# Patient Record
Sex: Female | Born: 1955 | Race: White | Hispanic: No | Marital: Single | Smoking: Never smoker
Health system: Southern US, Community
[De-identification: ages and names within clinical notes are randomized; demographics above are authoritative.]

## PROBLEM LIST (undated history)

## (undated) DIAGNOSIS — C541 Malignant neoplasm of endometrium: Secondary | ICD-10-CM

## (undated) DIAGNOSIS — F32A Depression, unspecified: Secondary | ICD-10-CM

## (undated) DIAGNOSIS — M4800 Spinal stenosis, site unspecified: Secondary | ICD-10-CM

## (undated) DIAGNOSIS — M4306 Spondylolysis, lumbar region: Secondary | ICD-10-CM

## (undated) DIAGNOSIS — E079 Disorder of thyroid, unspecified: Secondary | ICD-10-CM

## (undated) DIAGNOSIS — F329 Major depressive disorder, single episode, unspecified: Secondary | ICD-10-CM

## (undated) DIAGNOSIS — T7840XA Allergy, unspecified, initial encounter: Secondary | ICD-10-CM

## (undated) DIAGNOSIS — I10 Essential (primary) hypertension: Secondary | ICD-10-CM

## (undated) DIAGNOSIS — M1990 Unspecified osteoarthritis, unspecified site: Secondary | ICD-10-CM

## (undated) DIAGNOSIS — I499 Cardiac arrhythmia, unspecified: Secondary | ICD-10-CM

## (undated) DIAGNOSIS — F419 Anxiety disorder, unspecified: Secondary | ICD-10-CM

## (undated) DIAGNOSIS — G4730 Sleep apnea, unspecified: Secondary | ICD-10-CM

## (undated) DIAGNOSIS — M797 Fibromyalgia: Secondary | ICD-10-CM

## (undated) HISTORY — DX: Spondylolysis, lumbar region: M43.06

## (undated) HISTORY — DX: Disorder of thyroid, unspecified: E07.9

## (undated) HISTORY — DX: Major depressive disorder, single episode, unspecified: F32.9

## (undated) HISTORY — DX: Depression, unspecified: F32.A

## (undated) HISTORY — PX: ABDOMINAL HYSTERECTOMY: SHX81

## (undated) HISTORY — DX: Allergy, unspecified, initial encounter: T78.40XA

## (undated) HISTORY — DX: Unspecified osteoarthritis, unspecified site: M19.90

## (undated) HISTORY — DX: Spinal stenosis, site unspecified: M48.00

## (undated) HISTORY — DX: Anxiety disorder, unspecified: F41.9

## (undated) HISTORY — DX: Fibromyalgia: M79.7

## (undated) HISTORY — DX: Sleep apnea, unspecified: G47.30

## (undated) HISTORY — DX: Essential (primary) hypertension: I10

---

## 1997-08-22 ENCOUNTER — Inpatient Hospital Stay (HOSPITAL_COMMUNITY): Admission: AD | Admit: 1997-08-22 | Discharge: 1997-08-23 | Payer: Self-pay | Admitting: Critical Care Medicine

## 1997-11-11 ENCOUNTER — Emergency Department (HOSPITAL_COMMUNITY): Admission: EM | Admit: 1997-11-11 | Discharge: 1997-11-11 | Payer: Self-pay | Admitting: Emergency Medicine

## 1998-01-22 ENCOUNTER — Ambulatory Visit (HOSPITAL_COMMUNITY): Admission: RE | Admit: 1998-01-22 | Discharge: 1998-01-22 | Payer: Self-pay | Admitting: Critical Care Medicine

## 1998-01-22 ENCOUNTER — Encounter: Payer: Self-pay | Admitting: Critical Care Medicine

## 1998-06-24 ENCOUNTER — Encounter: Admission: RE | Admit: 1998-06-24 | Discharge: 1998-06-24 | Payer: Self-pay | Admitting: Obstetrics & Gynecology

## 1998-06-24 ENCOUNTER — Other Ambulatory Visit: Admission: RE | Admit: 1998-06-24 | Discharge: 1998-06-24 | Payer: Self-pay | Admitting: Obstetrics & Gynecology

## 1998-06-29 ENCOUNTER — Inpatient Hospital Stay (HOSPITAL_COMMUNITY): Admission: AD | Admit: 1998-06-29 | Discharge: 1998-06-29 | Payer: Self-pay | Admitting: Obstetrics

## 1998-07-04 ENCOUNTER — Encounter: Payer: Self-pay | Admitting: Obstetrics & Gynecology

## 1998-07-04 ENCOUNTER — Ambulatory Visit (HOSPITAL_COMMUNITY): Admission: RE | Admit: 1998-07-04 | Discharge: 1998-07-04 | Payer: Self-pay | Admitting: Obstetrics & Gynecology

## 1998-08-18 ENCOUNTER — Emergency Department (HOSPITAL_COMMUNITY): Admission: EM | Admit: 1998-08-18 | Discharge: 1998-08-18 | Payer: Self-pay | Admitting: Emergency Medicine

## 1998-08-21 ENCOUNTER — Emergency Department (HOSPITAL_COMMUNITY): Admission: EM | Admit: 1998-08-21 | Discharge: 1998-08-21 | Payer: Self-pay | Admitting: Emergency Medicine

## 1998-08-31 ENCOUNTER — Emergency Department (HOSPITAL_COMMUNITY): Admission: EM | Admit: 1998-08-31 | Discharge: 1998-08-31 | Payer: Self-pay | Admitting: Emergency Medicine

## 1998-09-06 ENCOUNTER — Emergency Department (HOSPITAL_COMMUNITY): Admission: EM | Admit: 1998-09-06 | Discharge: 1998-09-06 | Payer: Self-pay | Admitting: Emergency Medicine

## 1998-09-28 ENCOUNTER — Encounter: Payer: Self-pay | Admitting: Emergency Medicine

## 1998-09-28 ENCOUNTER — Emergency Department (HOSPITAL_COMMUNITY): Admission: EM | Admit: 1998-09-28 | Discharge: 1998-09-28 | Payer: Self-pay | Admitting: Emergency Medicine

## 2000-02-20 ENCOUNTER — Emergency Department (HOSPITAL_COMMUNITY): Admission: EM | Admit: 2000-02-20 | Discharge: 2000-02-21 | Payer: Self-pay | Admitting: *Deleted

## 2000-06-11 ENCOUNTER — Emergency Department (HOSPITAL_COMMUNITY): Admission: EM | Admit: 2000-06-11 | Discharge: 2000-06-11 | Payer: Self-pay | Admitting: Emergency Medicine

## 2000-09-06 ENCOUNTER — Emergency Department (HOSPITAL_COMMUNITY): Admission: EM | Admit: 2000-09-06 | Discharge: 2000-09-07 | Payer: Self-pay | Admitting: Emergency Medicine

## 2000-09-07 ENCOUNTER — Encounter: Payer: Self-pay | Admitting: Emergency Medicine

## 2000-12-02 ENCOUNTER — Emergency Department (HOSPITAL_COMMUNITY): Admission: EM | Admit: 2000-12-02 | Discharge: 2000-12-02 | Payer: Self-pay | Admitting: Emergency Medicine

## 2001-07-18 ENCOUNTER — Emergency Department (HOSPITAL_COMMUNITY): Admission: EM | Admit: 2001-07-18 | Discharge: 2001-07-19 | Payer: Self-pay | Admitting: Emergency Medicine

## 2001-09-07 ENCOUNTER — Emergency Department (HOSPITAL_COMMUNITY): Admission: EM | Admit: 2001-09-07 | Discharge: 2001-09-07 | Payer: Self-pay | Admitting: Emergency Medicine

## 2001-11-22 ENCOUNTER — Emergency Department (HOSPITAL_COMMUNITY): Admission: EM | Admit: 2001-11-22 | Discharge: 2001-11-23 | Payer: Self-pay | Admitting: Emergency Medicine

## 2001-11-23 ENCOUNTER — Encounter: Payer: Self-pay | Admitting: Emergency Medicine

## 2002-01-02 ENCOUNTER — Ambulatory Visit (HOSPITAL_COMMUNITY): Admission: RE | Admit: 2002-01-02 | Discharge: 2002-01-02 | Payer: Self-pay | Admitting: Internal Medicine

## 2002-01-02 ENCOUNTER — Encounter: Payer: Self-pay | Admitting: Internal Medicine

## 2002-01-17 ENCOUNTER — Emergency Department (HOSPITAL_COMMUNITY): Admission: EM | Admit: 2002-01-17 | Discharge: 2002-01-18 | Payer: Self-pay | Admitting: Emergency Medicine

## 2002-01-19 ENCOUNTER — Emergency Department (HOSPITAL_COMMUNITY): Admission: EM | Admit: 2002-01-19 | Discharge: 2002-01-19 | Payer: Self-pay | Admitting: Emergency Medicine

## 2002-01-19 ENCOUNTER — Encounter: Payer: Self-pay | Admitting: Emergency Medicine

## 2002-03-17 ENCOUNTER — Emergency Department (HOSPITAL_COMMUNITY): Admission: EM | Admit: 2002-03-17 | Discharge: 2002-03-17 | Payer: Self-pay | Admitting: Emergency Medicine

## 2002-04-11 ENCOUNTER — Emergency Department (HOSPITAL_COMMUNITY): Admission: EM | Admit: 2002-04-11 | Discharge: 2002-04-11 | Payer: Self-pay | Admitting: Emergency Medicine

## 2002-04-11 ENCOUNTER — Encounter: Payer: Self-pay | Admitting: Emergency Medicine

## 2002-04-12 ENCOUNTER — Emergency Department (HOSPITAL_COMMUNITY): Admission: EM | Admit: 2002-04-12 | Discharge: 2002-04-12 | Payer: Self-pay | Admitting: Emergency Medicine

## 2002-05-04 ENCOUNTER — Encounter: Payer: Self-pay | Admitting: Emergency Medicine

## 2002-05-04 ENCOUNTER — Emergency Department (HOSPITAL_COMMUNITY): Admission: EM | Admit: 2002-05-04 | Discharge: 2002-05-04 | Payer: Self-pay | Admitting: Emergency Medicine

## 2002-06-19 ENCOUNTER — Encounter: Admission: RE | Admit: 2002-06-19 | Discharge: 2002-06-19 | Payer: Self-pay | Admitting: Endocrinology

## 2002-06-19 ENCOUNTER — Encounter: Payer: Self-pay | Admitting: Endocrinology

## 2002-06-30 ENCOUNTER — Emergency Department (HOSPITAL_COMMUNITY): Admission: EM | Admit: 2002-06-30 | Discharge: 2002-06-30 | Payer: Self-pay | Admitting: Emergency Medicine

## 2002-07-24 ENCOUNTER — Encounter: Payer: Self-pay | Admitting: Obstetrics & Gynecology

## 2002-07-24 ENCOUNTER — Ambulatory Visit (HOSPITAL_COMMUNITY): Admission: RE | Admit: 2002-07-24 | Discharge: 2002-07-24 | Payer: Self-pay | Admitting: Obstetrics & Gynecology

## 2002-07-28 ENCOUNTER — Emergency Department (HOSPITAL_COMMUNITY): Admission: EM | Admit: 2002-07-28 | Discharge: 2002-07-28 | Payer: Self-pay

## 2002-08-14 ENCOUNTER — Emergency Department (HOSPITAL_COMMUNITY): Admission: EM | Admit: 2002-08-14 | Discharge: 2002-08-14 | Payer: Self-pay | Admitting: Emergency Medicine

## 2002-09-02 ENCOUNTER — Encounter: Payer: Self-pay | Admitting: Emergency Medicine

## 2002-09-02 ENCOUNTER — Emergency Department (HOSPITAL_COMMUNITY): Admission: EM | Admit: 2002-09-02 | Discharge: 2002-09-02 | Payer: Self-pay | Admitting: Emergency Medicine

## 2002-09-03 ENCOUNTER — Emergency Department (HOSPITAL_COMMUNITY): Admission: EM | Admit: 2002-09-03 | Discharge: 2002-09-03 | Payer: Self-pay | Admitting: Emergency Medicine

## 2002-09-09 ENCOUNTER — Emergency Department (HOSPITAL_COMMUNITY): Admission: EM | Admit: 2002-09-09 | Discharge: 2002-09-10 | Payer: Self-pay | Admitting: Emergency Medicine

## 2002-11-08 ENCOUNTER — Emergency Department (HOSPITAL_COMMUNITY): Admission: EM | Admit: 2002-11-08 | Discharge: 2002-11-08 | Payer: Self-pay | Admitting: Emergency Medicine

## 2002-11-28 ENCOUNTER — Emergency Department (HOSPITAL_COMMUNITY): Admission: EM | Admit: 2002-11-28 | Discharge: 2002-11-28 | Payer: Self-pay | Admitting: Emergency Medicine

## 2003-03-22 ENCOUNTER — Emergency Department (HOSPITAL_COMMUNITY): Admission: EM | Admit: 2003-03-22 | Discharge: 2003-03-22 | Payer: Self-pay | Admitting: Emergency Medicine

## 2003-04-09 ENCOUNTER — Emergency Department (HOSPITAL_COMMUNITY): Admission: EM | Admit: 2003-04-09 | Discharge: 2003-04-09 | Payer: Self-pay

## 2003-04-16 ENCOUNTER — Emergency Department (HOSPITAL_COMMUNITY): Admission: EM | Admit: 2003-04-16 | Discharge: 2003-04-16 | Payer: Self-pay | Admitting: Emergency Medicine

## 2003-07-29 ENCOUNTER — Emergency Department (HOSPITAL_COMMUNITY): Admission: EM | Admit: 2003-07-29 | Discharge: 2003-07-29 | Payer: Self-pay | Admitting: Family Medicine

## 2003-08-29 ENCOUNTER — Ambulatory Visit (HOSPITAL_COMMUNITY): Admission: RE | Admit: 2003-08-29 | Discharge: 2003-08-29 | Payer: Self-pay | Admitting: Internal Medicine

## 2003-09-03 ENCOUNTER — Emergency Department (HOSPITAL_COMMUNITY): Admission: EM | Admit: 2003-09-03 | Discharge: 2003-09-03 | Payer: Self-pay | Admitting: *Deleted

## 2004-01-06 ENCOUNTER — Ambulatory Visit: Payer: Self-pay | Admitting: Endocrinology

## 2004-04-27 ENCOUNTER — Emergency Department (HOSPITAL_COMMUNITY): Admission: EM | Admit: 2004-04-27 | Discharge: 2004-04-27 | Payer: Self-pay | Admitting: Family Medicine

## 2004-05-11 ENCOUNTER — Ambulatory Visit (HOSPITAL_COMMUNITY): Admission: RE | Admit: 2004-05-11 | Discharge: 2004-05-11 | Payer: Self-pay | Admitting: Obstetrics & Gynecology

## 2004-05-28 ENCOUNTER — Emergency Department (HOSPITAL_COMMUNITY): Admission: EM | Admit: 2004-05-28 | Discharge: 2004-05-28 | Payer: Self-pay | Admitting: Family Medicine

## 2004-11-04 ENCOUNTER — Emergency Department (HOSPITAL_COMMUNITY): Admission: EM | Admit: 2004-11-04 | Discharge: 2004-11-04 | Payer: Self-pay | Admitting: Family Medicine

## 2004-12-08 ENCOUNTER — Ambulatory Visit: Payer: Self-pay | Admitting: Endocrinology

## 2005-03-18 ENCOUNTER — Emergency Department (HOSPITAL_COMMUNITY): Admission: EM | Admit: 2005-03-18 | Discharge: 2005-03-18 | Payer: Self-pay | Admitting: Emergency Medicine

## 2005-04-08 ENCOUNTER — Ambulatory Visit: Payer: Self-pay | Admitting: Endocrinology

## 2005-12-21 ENCOUNTER — Encounter: Admission: RE | Admit: 2005-12-21 | Discharge: 2005-12-21 | Payer: Self-pay | Admitting: Internal Medicine

## 2005-12-29 ENCOUNTER — Emergency Department (HOSPITAL_COMMUNITY): Admission: EM | Admit: 2005-12-29 | Discharge: 2005-12-29 | Payer: Self-pay | Admitting: Family Medicine

## 2006-01-28 ENCOUNTER — Ambulatory Visit: Payer: Self-pay | Admitting: Endocrinology

## 2006-06-22 ENCOUNTER — Emergency Department (HOSPITAL_COMMUNITY): Admission: EM | Admit: 2006-06-22 | Discharge: 2006-06-22 | Payer: Self-pay | Admitting: Emergency Medicine

## 2006-07-07 ENCOUNTER — Emergency Department (HOSPITAL_COMMUNITY): Admission: EM | Admit: 2006-07-07 | Discharge: 2006-07-08 | Payer: Self-pay | Admitting: Emergency Medicine

## 2006-07-11 ENCOUNTER — Emergency Department (HOSPITAL_COMMUNITY): Admission: EM | Admit: 2006-07-11 | Discharge: 2006-07-12 | Payer: Self-pay | Admitting: Emergency Medicine

## 2006-10-17 ENCOUNTER — Emergency Department (HOSPITAL_COMMUNITY): Admission: EM | Admit: 2006-10-17 | Discharge: 2006-10-17 | Payer: Self-pay | Admitting: Family Medicine

## 2006-11-29 DIAGNOSIS — F429 Obsessive-compulsive disorder, unspecified: Secondary | ICD-10-CM | POA: Insufficient documentation

## 2006-11-29 DIAGNOSIS — I10 Essential (primary) hypertension: Secondary | ICD-10-CM | POA: Insufficient documentation

## 2006-11-29 DIAGNOSIS — M4800 Spinal stenosis, site unspecified: Secondary | ICD-10-CM

## 2006-11-29 DIAGNOSIS — E039 Hypothyroidism, unspecified: Secondary | ICD-10-CM

## 2006-11-29 DIAGNOSIS — N83209 Unspecified ovarian cyst, unspecified side: Secondary | ICD-10-CM

## 2006-11-29 DIAGNOSIS — G5600 Carpal tunnel syndrome, unspecified upper limb: Secondary | ICD-10-CM

## 2006-11-29 DIAGNOSIS — J45909 Unspecified asthma, uncomplicated: Secondary | ICD-10-CM

## 2007-01-02 ENCOUNTER — Emergency Department (HOSPITAL_COMMUNITY): Admission: EM | Admit: 2007-01-02 | Discharge: 2007-01-02 | Payer: Self-pay | Admitting: Emergency Medicine

## 2007-02-11 ENCOUNTER — Emergency Department (HOSPITAL_COMMUNITY): Admission: EM | Admit: 2007-02-11 | Discharge: 2007-02-11 | Payer: Self-pay | Admitting: Family Medicine

## 2007-02-21 ENCOUNTER — Emergency Department (HOSPITAL_COMMUNITY): Admission: EM | Admit: 2007-02-21 | Discharge: 2007-02-21 | Payer: Self-pay | Admitting: Emergency Medicine

## 2007-03-25 ENCOUNTER — Emergency Department (HOSPITAL_COMMUNITY): Admission: EM | Admit: 2007-03-25 | Discharge: 2007-03-25 | Payer: Self-pay | Admitting: Family Medicine

## 2007-06-16 ENCOUNTER — Encounter: Admission: RE | Admit: 2007-06-16 | Discharge: 2007-06-16 | Payer: Self-pay | Admitting: Orthopaedic Surgery

## 2007-07-16 ENCOUNTER — Emergency Department (HOSPITAL_COMMUNITY): Admission: EM | Admit: 2007-07-16 | Discharge: 2007-07-16 | Payer: Self-pay | Admitting: Emergency Medicine

## 2007-09-27 ENCOUNTER — Emergency Department (HOSPITAL_COMMUNITY): Admission: EM | Admit: 2007-09-27 | Discharge: 2007-09-28 | Payer: Self-pay | Admitting: Emergency Medicine

## 2007-10-29 ENCOUNTER — Ambulatory Visit (HOSPITAL_BASED_OUTPATIENT_CLINIC_OR_DEPARTMENT_OTHER): Admission: RE | Admit: 2007-10-29 | Discharge: 2007-10-29 | Payer: Self-pay | Admitting: Internal Medicine

## 2007-11-04 ENCOUNTER — Ambulatory Visit: Payer: Self-pay | Admitting: Internal Medicine

## 2007-11-24 ENCOUNTER — Emergency Department (HOSPITAL_COMMUNITY): Admission: EM | Admit: 2007-11-24 | Discharge: 2007-11-25 | Payer: Self-pay | Admitting: Emergency Medicine

## 2007-12-05 ENCOUNTER — Emergency Department (HOSPITAL_COMMUNITY): Admission: EM | Admit: 2007-12-05 | Discharge: 2007-12-06 | Payer: Self-pay | Admitting: Emergency Medicine

## 2007-12-24 ENCOUNTER — Emergency Department (HOSPITAL_COMMUNITY): Admission: EM | Admit: 2007-12-24 | Discharge: 2007-12-24 | Payer: Self-pay | Admitting: Emergency Medicine

## 2008-01-12 ENCOUNTER — Emergency Department (HOSPITAL_COMMUNITY): Admission: EM | Admit: 2008-01-12 | Discharge: 2008-01-12 | Payer: Self-pay | Admitting: Emergency Medicine

## 2008-05-24 ENCOUNTER — Ambulatory Visit (HOSPITAL_COMMUNITY): Admission: RE | Admit: 2008-05-24 | Discharge: 2008-05-24 | Payer: Self-pay | Admitting: Internal Medicine

## 2008-08-05 ENCOUNTER — Ambulatory Visit (HOSPITAL_COMMUNITY): Admission: RE | Admit: 2008-08-05 | Discharge: 2008-08-05 | Payer: Self-pay | Admitting: Gastroenterology

## 2008-10-25 ENCOUNTER — Emergency Department (HOSPITAL_COMMUNITY): Admission: EM | Admit: 2008-10-25 | Discharge: 2008-10-25 | Payer: Self-pay | Admitting: Emergency Medicine

## 2008-11-14 ENCOUNTER — Emergency Department (HOSPITAL_COMMUNITY): Admission: EM | Admit: 2008-11-14 | Discharge: 2008-11-14 | Payer: Self-pay | Admitting: Emergency Medicine

## 2009-04-25 ENCOUNTER — Encounter
Admission: RE | Admit: 2009-04-25 | Discharge: 2009-04-28 | Payer: Self-pay | Admitting: Physical Medicine & Rehabilitation

## 2009-05-13 ENCOUNTER — Encounter: Admission: RE | Admit: 2009-05-13 | Discharge: 2009-05-13 | Payer: Self-pay | Admitting: Orthopedic Surgery

## 2009-05-15 ENCOUNTER — Emergency Department (HOSPITAL_COMMUNITY): Admission: EM | Admit: 2009-05-15 | Discharge: 2009-05-15 | Payer: Self-pay | Admitting: Emergency Medicine

## 2009-06-16 ENCOUNTER — Encounter: Admission: RE | Admit: 2009-06-16 | Discharge: 2009-06-16 | Payer: Self-pay | Admitting: Neurosurgery

## 2009-06-30 ENCOUNTER — Encounter: Admission: RE | Admit: 2009-06-30 | Discharge: 2009-06-30 | Payer: Self-pay | Admitting: Neurosurgery

## 2009-11-20 ENCOUNTER — Encounter
Admission: RE | Admit: 2009-11-20 | Discharge: 2010-02-18 | Payer: Self-pay | Admitting: Physical Medicine & Rehabilitation

## 2010-01-09 ENCOUNTER — Ambulatory Visit: Payer: Self-pay | Admitting: Physical Medicine & Rehabilitation

## 2010-01-28 ENCOUNTER — Encounter
Admission: RE | Admit: 2010-01-28 | Discharge: 2010-01-28 | Payer: Self-pay | Source: Home / Self Care | Attending: Obstetrics & Gynecology | Admitting: Obstetrics & Gynecology

## 2010-02-13 ENCOUNTER — Encounter
Admission: RE | Admit: 2010-02-13 | Discharge: 2010-04-28 | Payer: Self-pay | Source: Home / Self Care | Attending: Physical Medicine & Rehabilitation | Admitting: Physical Medicine & Rehabilitation

## 2010-04-18 ENCOUNTER — Encounter: Payer: Self-pay | Admitting: Obstetrics & Gynecology

## 2010-04-19 ENCOUNTER — Encounter: Payer: Self-pay | Admitting: Family Medicine

## 2010-04-19 ENCOUNTER — Encounter: Payer: Self-pay | Admitting: Obstetrics & Gynecology

## 2010-04-19 ENCOUNTER — Encounter: Payer: Self-pay | Admitting: Orthopaedic Surgery

## 2010-04-19 ENCOUNTER — Encounter: Payer: Self-pay | Admitting: Internal Medicine

## 2010-04-19 ENCOUNTER — Encounter: Payer: Self-pay | Admitting: Endocrinology

## 2010-04-19 ENCOUNTER — Encounter: Payer: Self-pay | Admitting: Orthopedic Surgery

## 2010-04-23 ENCOUNTER — Emergency Department (HOSPITAL_COMMUNITY)
Admission: EM | Admit: 2010-04-23 | Discharge: 2010-04-23 | Payer: Self-pay | Source: Home / Self Care | Admitting: Family Medicine

## 2010-06-08 ENCOUNTER — Encounter: Payer: Medicaid Other | Attending: Physical Medicine & Rehabilitation

## 2010-06-08 ENCOUNTER — Encounter (HOSPITAL_BASED_OUTPATIENT_CLINIC_OR_DEPARTMENT_OTHER): Payer: Medicaid Other | Admitting: Physical Medicine & Rehabilitation

## 2010-06-08 DIAGNOSIS — M545 Low back pain, unspecified: Secondary | ICD-10-CM | POA: Insufficient documentation

## 2010-06-08 DIAGNOSIS — M47817 Spondylosis without myelopathy or radiculopathy, lumbosacral region: Secondary | ICD-10-CM

## 2010-06-08 DIAGNOSIS — M549 Dorsalgia, unspecified: Secondary | ICD-10-CM | POA: Insufficient documentation

## 2010-06-08 DIAGNOSIS — M48061 Spinal stenosis, lumbar region without neurogenic claudication: Secondary | ICD-10-CM | POA: Insufficient documentation

## 2010-06-15 ENCOUNTER — Ambulatory Visit: Payer: Medicaid Other | Admitting: Neurosurgery

## 2010-06-17 LAB — CBC
MCHC: 34.9 g/dL (ref 30.0–36.0)
Platelets: 245 10*3/uL (ref 150–400)
RDW: 12.9 % (ref 11.5–15.5)

## 2010-06-17 LAB — CK TOTAL AND CKMB (NOT AT ARMC)
CK, MB: 1.1 ng/mL (ref 0.3–4.0)
Relative Index: INVALID (ref 0.0–2.5)
Total CK: 69 U/L (ref 7–177)

## 2010-06-17 LAB — COMPREHENSIVE METABOLIC PANEL
Alkaline Phosphatase: 67 U/L (ref 39–117)
BUN: 7 mg/dL (ref 6–23)
Calcium: 9.5 mg/dL (ref 8.4–10.5)
Creatinine, Ser: 0.64 mg/dL (ref 0.4–1.2)
Glucose, Bld: 104 mg/dL — ABNORMAL HIGH (ref 70–99)
Total Protein: 7.8 g/dL (ref 6.0–8.3)

## 2010-06-17 LAB — DIFFERENTIAL
Basophils Relative: 0 % (ref 0–1)
Lymphocytes Relative: 43 % (ref 12–46)
Lymphs Abs: 3.3 10*3/uL (ref 0.7–4.0)
Monocytes Relative: 9 % (ref 3–12)
Neutro Abs: 3.4 10*3/uL (ref 1.7–7.7)
Neutrophils Relative %: 44 % (ref 43–77)

## 2010-06-17 LAB — URINALYSIS, ROUTINE W REFLEX MICROSCOPIC
Hgb urine dipstick: NEGATIVE
Nitrite: NEGATIVE
Specific Gravity, Urine: 1.014 (ref 1.005–1.030)
Urobilinogen, UA: 0.2 mg/dL (ref 0.0–1.0)
pH: 5.5 (ref 5.0–8.0)

## 2010-06-17 LAB — LIPASE, BLOOD: Lipase: 38 U/L (ref 11–59)

## 2010-06-17 LAB — URINE MICROSCOPIC-ADD ON

## 2010-06-17 LAB — URINE CULTURE: Colony Count: 25000

## 2010-06-29 ENCOUNTER — Ambulatory Visit: Payer: Medicaid Other | Admitting: Physical Medicine & Rehabilitation

## 2010-07-04 LAB — DIFFERENTIAL
Eosinophils Absolute: 0.3 10*3/uL (ref 0.0–0.7)
Lymphocytes Relative: 40 % (ref 12–46)
Lymphs Abs: 3.8 10*3/uL (ref 0.7–4.0)
Monocytes Relative: 7 % (ref 3–12)
Neutrophils Relative %: 50 % (ref 43–77)

## 2010-07-04 LAB — COMPREHENSIVE METABOLIC PANEL
ALT: 33 U/L (ref 0–35)
AST: 75 U/L — ABNORMAL HIGH (ref 0–37)
Alkaline Phosphatase: 70 U/L (ref 39–117)
CO2: 25 mEq/L (ref 19–32)
Calcium: 9.3 mg/dL (ref 8.4–10.5)
Chloride: 104 mEq/L (ref 96–112)
GFR calc Af Amer: >60 mL/min (ref >60)
GFR calc non Af Amer: >60 mL/min (ref >60)
Glucose, Bld: 99 mg/dL (ref 70–99)
Sodium: 137 mEq/L (ref 135–145)
Total Bilirubin: 0.6 mg/dL (ref 0.3–1.2)

## 2010-07-04 LAB — URINALYSIS, ROUTINE W REFLEX MICROSCOPIC
Bilirubin Urine: NEGATIVE
Hgb urine dipstick: NEGATIVE
Ketones, ur: NEGATIVE mg/dL
Nitrite: NEGATIVE
Protein, ur: NEGATIVE mg/dL
Urobilinogen, UA: 0.2 mg/dL (ref 0.0–1.0)

## 2010-07-04 LAB — CBC
HCT: 43.7 % (ref 36.0–46.0)
MCV: 88.1 fL (ref 78.0–100.0)
Platelets: 250 10*3/uL (ref 150–400)
RBC: 4.96 MIL/uL (ref 3.87–5.11)
WBC: 9.6 10*3/uL (ref 4.0–10.5)

## 2010-07-04 LAB — PREGNANCY, URINE: Preg Test, Ur: NEGATIVE

## 2010-07-04 LAB — URINE MICROSCOPIC-ADD ON

## 2010-07-13 ENCOUNTER — Encounter: Payer: Medicaid Other | Admitting: Physical Medicine & Rehabilitation

## 2010-07-31 ENCOUNTER — Ambulatory Visit: Payer: Medicaid Other | Admitting: Physical Medicine & Rehabilitation

## 2010-08-11 NOTE — Procedures (Signed)
[name], [name]            ACCOUNT NO.:  [id number]   MEDICAL RECORD NO.:  [id number]          PATIENT TYPE:  OUT   LOCATION:  SLEEP CENTER                 FACILITY:  [facility]   PHYSICIAN:  Clinton D. Maple Hudson, MD, FCCP, FACPDATE OF BIRTH:  10-21-1955   DATE OF STUDY:  10/29/2007                            NOCTURNAL POLYSOMNOGRAM   REFERRING PHYSICIAN:  Fleet Contras, M.D.   REFERRING PHYSICIAN:  Dr. Fleet Contras.   INDICATION FOR STUDY:  Hypersomnia with sleep apnea.   EPWORTH SLEEPINESS SCORE:  7/24.  BMI 38, weight 187 pounds, height 59  inches, neck 17 inches.   HOME MEDICATIONS:  Home medications charted and reviewed.   A diagnostic study on May 19, 2006 had recorded an AHI of 12.5 per  hour.  CPAP titration is requested.   SLEEP ARCHITECTURE:  Total sleep time 243 minutes with sleep efficiency  64.5%.  Stage 1 was 3.7%, stage 2 87.7%, stage 3 absent, REM 8.6% of  total sleep time, sleep latency 96 minutes, REM latency 240 minutes,  awake after sleep onset 24 minutes, arousal index 13.6.  Lorazepam was  taken at bedtime.   CPAP titration protocol:  CPAP was titrated to 10 CWP, AHI 0 per hour.  She chose a small Mirage Quattro mask with heated humidifier.   OXYGEN DATA:  Snoring was prevented by CPAP, and mean oxygen saturation  held at 95.6% while on CPAP with room air.   CARDIAC DATA:  Normal sinus rhythm.   MOVEMENT/PARASOMNIA:  A total of 16 limb jerks were counted of which 4  were associated with arousal or awakening for an index of 1 per hour  which is of doubtful significance.   IMPRESSION/RECOMMENDATION:  1. Successful CPAP titration to 10 CWP, AHI 0 per hour.  She chose a      small Mirage Quattro full face mask with      heated humidifier.  2. Baseline diagnostic NPSG on May 19, 2006 had recorded an AHI      of 12.5 per hour.      Clinton D. Maple Hudson, MD, [facility], FACP  Diplomate, Biomedical engineer of Sleep Medicine  Electronically Signed     CDY/MEDQ  D:  11/04/2007 09:48:12  T:  11/04/2007 10:13:59  Job:  [id number]   cc:   Fleet Contras, M.D.  Fax: [phone]

## 2010-08-14 NOTE — Letter (Signed)
December 30, 2005    [facility] [place] [place]  [address]  [place], [place] [address]   RE:  [name], [name]  MRN:  [id number]  /  DOB:  July 12, 1955   Dear [name]:   The staff here at [facility] advises me that you have once again  cancelled you appointment at the last minute.  I have reviewed your medical  record today and found that this is a chronic, ongoing pattern.   Because of this pattern, you are being discharged as a patient from [facility].  This dismissal becomes effective 30 days after your receipt of  this letter.   You are advised to seek other medical care as soon as possible for your  health care needs.  Our medical records department is happy to forward  records to your new physician upon your written request.  There may be a  charge for this service.   With best wishes for your health.    Sincerely,     ______________________________  Cleophas Dunker. Everardo All, MD    SAE/MedQ  /  Job #:  [id number]  DD:  12/30/2005 / DT:  12/31/2005

## 2010-10-12 ENCOUNTER — Encounter (INDEPENDENT_AMBULATORY_CARE_PROVIDER_SITE_OTHER): Payer: Self-pay | Admitting: Surgery

## 2010-12-24 LAB — POCT CARDIAC MARKERS: Operator id: 264421

## 2010-12-24 LAB — DIFFERENTIAL
Basophils Relative: 1
Eosinophils Absolute: 0.3
Lymphs Abs: 4.1 — ABNORMAL HIGH
Neutro Abs: 5.8
Neutrophils Relative %: 53

## 2010-12-24 LAB — CBC
HCT: 45.3
Hemoglobin: 15.8 — ABNORMAL HIGH
MCHC: 34.9
RBC: 5.22 — ABNORMAL HIGH
RDW: 13.1

## 2010-12-24 LAB — COMPREHENSIVE METABOLIC PANEL
ALT: 28
Alkaline Phosphatase: 79
BUN: 10
CO2: 24
Calcium: 9.8
GFR calc non Af Amer: >60
Glucose, Bld: 119 — ABNORMAL HIGH
Sodium: 140

## 2010-12-24 LAB — LIPASE, BLOOD: Lipase: 35

## 2010-12-24 LAB — D-DIMER, QUANTITATIVE: D-Dimer, Quant: 0.29

## 2010-12-28 LAB — POCT URINALYSIS DIP (DEVICE)
Bilirubin Urine: NEGATIVE
Glucose, UA: NEGATIVE
Hgb urine dipstick: NEGATIVE
Ketones, ur: NEGATIVE
Specific Gravity, Urine: 1.01

## 2010-12-28 LAB — URINALYSIS, ROUTINE W REFLEX MICROSCOPIC
Bilirubin Urine: NEGATIVE
Hgb urine dipstick: NEGATIVE
Ketones, ur: NEGATIVE
Specific Gravity, Urine: 1.014
pH: 5.5

## 2010-12-28 LAB — WET PREP, GENITAL
Clue Cells Wet Prep HPF POC: NONE SEEN
Trich, Wet Prep: NONE SEEN

## 2010-12-28 LAB — GC/CHLAMYDIA PROBE AMP, GENITAL
Chlamydia, DNA Probe: NEGATIVE
GC Probe Amp, Genital: NEGATIVE

## 2010-12-30 ENCOUNTER — Emergency Department (HOSPITAL_COMMUNITY)
Admission: EM | Admit: 2010-12-30 | Discharge: 2010-12-30 | Disposition: A | Discharge status: Home / Self Care | Payer: Medicaid Other | Attending: Emergency Medicine | Admitting: Emergency Medicine

## 2010-12-30 DIAGNOSIS — Z79899 Other long term (current) drug therapy: Secondary | ICD-10-CM | POA: Insufficient documentation

## 2010-12-30 DIAGNOSIS — R059 Cough, unspecified: Secondary | ICD-10-CM | POA: Insufficient documentation

## 2010-12-30 DIAGNOSIS — I10 Essential (primary) hypertension: Secondary | ICD-10-CM | POA: Insufficient documentation

## 2010-12-30 DIAGNOSIS — M549 Dorsalgia, unspecified: Secondary | ICD-10-CM | POA: Insufficient documentation

## 2010-12-30 DIAGNOSIS — F3289 Other specified depressive episodes: Secondary | ICD-10-CM | POA: Insufficient documentation

## 2010-12-30 DIAGNOSIS — F411 Generalized anxiety disorder: Secondary | ICD-10-CM | POA: Insufficient documentation

## 2010-12-30 DIAGNOSIS — R05 Cough: Secondary | ICD-10-CM | POA: Insufficient documentation

## 2010-12-30 DIAGNOSIS — F329 Major depressive disorder, single episode, unspecified: Secondary | ICD-10-CM | POA: Insufficient documentation

## 2010-12-30 DIAGNOSIS — R300 Dysuria: Secondary | ICD-10-CM | POA: Insufficient documentation

## 2011-01-05 LAB — CULTURE, ROUTINE-ABSCESS

## 2011-07-08 ENCOUNTER — Other Ambulatory Visit: Payer: Self-pay

## 2011-07-08 ENCOUNTER — Emergency Department (HOSPITAL_COMMUNITY)
Admission: EM | Admit: 2011-07-08 | Discharge: 2011-07-08 | Disposition: A | Discharge status: Home / Self Care | Payer: Medicaid Other | Attending: Emergency Medicine | Admitting: Emergency Medicine

## 2011-07-08 ENCOUNTER — Emergency Department (HOSPITAL_COMMUNITY): Payer: Medicaid Other

## 2011-07-08 ENCOUNTER — Encounter (HOSPITAL_COMMUNITY): Payer: Self-pay | Admitting: *Deleted

## 2011-07-08 DIAGNOSIS — J45909 Unspecified asthma, uncomplicated: Secondary | ICD-10-CM | POA: Insufficient documentation

## 2011-07-08 DIAGNOSIS — I10 Essential (primary) hypertension: Secondary | ICD-10-CM | POA: Insufficient documentation

## 2011-07-08 DIAGNOSIS — E079 Disorder of thyroid, unspecified: Secondary | ICD-10-CM | POA: Insufficient documentation

## 2011-07-08 DIAGNOSIS — F341 Dysthymic disorder: Secondary | ICD-10-CM | POA: Insufficient documentation

## 2011-07-08 DIAGNOSIS — R002 Palpitations: Secondary | ICD-10-CM

## 2011-07-08 DIAGNOSIS — M129 Arthropathy, unspecified: Secondary | ICD-10-CM | POA: Insufficient documentation

## 2011-07-08 DIAGNOSIS — R5383 Other fatigue: Secondary | ICD-10-CM | POA: Insufficient documentation

## 2011-07-08 DIAGNOSIS — R5381 Other malaise: Secondary | ICD-10-CM | POA: Insufficient documentation

## 2011-07-08 DIAGNOSIS — Z79899 Other long term (current) drug therapy: Secondary | ICD-10-CM | POA: Insufficient documentation

## 2011-07-08 LAB — DIFFERENTIAL
Basophils Relative: 0 % (ref 0–1)
Eosinophils Absolute: 0.6 10*3/uL (ref 0.0–0.7)
Neutrophils Relative %: 45 % (ref 43–77)

## 2011-07-08 LAB — COMPREHENSIVE METABOLIC PANEL
Albumin: 4.2 g/dL (ref 3.5–5.2)
Alkaline Phosphatase: 58 U/L (ref 39–117)
BUN: 10 mg/dL (ref 6–23)
Calcium: 9.5 mg/dL (ref 8.4–10.5)
Potassium: 4 mEq/L (ref 3.5–5.1)
Sodium: 136 mEq/L (ref 135–145)
Total Protein: 7.6 g/dL (ref 6.0–8.3)

## 2011-07-08 LAB — URINALYSIS, ROUTINE W REFLEX MICROSCOPIC
Bilirubin Urine: NEGATIVE
Nitrite: NEGATIVE
Specific Gravity, Urine: 1.017 (ref 1.005–1.030)
pH: 6 (ref 5.0–8.0)

## 2011-07-08 LAB — URINE MICROSCOPIC-ADD ON

## 2011-07-08 LAB — TROPONIN I: Troponin I: <0.3 ng/mL (ref <0.30)

## 2011-07-08 LAB — CBC
MCH: 30.2 pg (ref 26.0–34.0)
MCHC: 35 g/dL (ref 30.0–36.0)
Platelets: 213 10*3/uL (ref 150–400)

## 2011-07-08 MED ORDER — SODIUM CHLORIDE 0.9 % IV BOLUS (SEPSIS)
1000.0000 mL | Freq: Once | INTRAVENOUS | Status: AC
  Administered 2011-07-08: 1000 mL via INTRAVENOUS

## 2011-07-08 NOTE — ED Notes (Signed)
Pt states she has been feeling weak for the last couple of months. Pt states has been has menstrual bleeding for the past couple of months. Pt states she has just been feeling weak and fatgue

## 2011-07-08 NOTE — ED Notes (Signed)
MD at bedside. Dr. Lockwood at bedside.  

## 2011-07-08 NOTE — ED Provider Notes (Signed)
History     CSN: [id number]  Arrival date & time 07/08/11  1743   First MD Initiated Contact with Patient 07/08/11 1846      Chief Complaint  Patient presents with  . Palpitations  . Weakness    (Consider location/radiation/quality/duration/timing/severity/associated sxs/prior treatment) HPI the patient presents with concerns over palpitations.  She notes that she has had intermittent episodes of palpitations for the past several years, but over the past 3 days palpitations and been more frequent, more prominent.  She denies any specific precipitants, or any exacerbating factors.  Spontaneous onset is typical, as is spontaneous cessation.  She denies any concurrent dyspnea, lightheadedness, nausea, vomiting, diarrhea. The patient underwent endometrial biopsy today for postmenopausal bleeding.  She is scheduled for ultrasound in one week.  Past Medical History  Diagnosis Date  . Sleep apnea   . Hypertension   . Thyroid disease   . Arthritis   . Asthma   . Depression   . Anxiety   . Fibromyalgia   . Lumbar spondylolysis   . Allergy     Past Surgical History  Procedure Date  . Cesarean section     No family history on file.  History  Substance Use Topics  . Smoking status: Never Smoker   . Smokeless tobacco: Never Used  . Alcohol Use: No    OB History    Grav Para Term Preterm Abortions TAB SAB Ect Mult Living                  Review of Systems  Constitutional:       HPI  HENT:       HPI otherwise negative  Eyes: Negative.   Respiratory:       HPI, otherwise negative  Cardiovascular:       HPI, otherwise nmegative  Gastrointestinal: Negative for vomiting.  Genitourinary:       HPI, otherwise negative  Musculoskeletal:       HPI, otherwise negative  Skin: Negative.   Neurological: Negative for syncope.    Allergies  Sulfamethoxazole w/trimethoprim  Home Medications   Current Outpatient Rx  Name Route Sig Dispense Refill  . LEVOTHYROXINE  SODIUM 125 MCG PO TABS Oral Take 125 mcg by mouth daily.      Marland Kitchen LORAZEPAM 1 MG PO TABS Oral Take 1 mg by mouth 2 (two) times daily.      . SERTRALINE HCL 100 MG PO TABS Oral Take 200 mg by mouth daily.      . THIOTHIXENE 2 MG PO CAPS Oral Take 2 mg by mouth 1 day or 1 dose.        BP 129/103  Pulse 95  Temp(Src) 98.5 F (36.9 C) (Oral)  Resp 24  SpO2 95%  Physical Exam  Nursing note and vitals reviewed. Constitutional: She is oriented to person, place, and time. She appears well-developed and well-nourished. No distress.  HENT:  Head: Normocephalic and atraumatic.  Eyes: Conjunctivae and EOM are normal.  Cardiovascular: Normal rate and regular rhythm.   Pulmonary/Chest: Effort normal and breath sounds normal. No stridor. No respiratory distress.  Abdominal: She exhibits no distension.  Musculoskeletal: She exhibits no edema.  Neurological: She is alert and oriented to person, place, and time. No cranial nerve deficit.  Skin: Skin is warm and dry.  Psychiatric: Her speech is normal and behavior is normal. Judgment and thought content normal. Her mood appears anxious. Cognition and memory are normal.    ED Course  Procedures (including  critical care time)   Labs Reviewed  CBC  DIFFERENTIAL  COMPREHENSIVE METABOLIC PANEL  TROPONIN I  URINALYSIS, ROUTINE W REFLEX MICROSCOPIC  TSH  T4, FREE   No results found.   No diagnosis found.  Pulse oximetry 99% room air normal Cardiac monitor 91 SR normal  CXR reviewed by me   Date: 07/08/2011  Rate: 99  Rhythm: normal sinus rhythm  QRS Axis: normal  Intervals: normal  ST/T Wave abnormalities: normal  Conduction Disutrbances:left anterior fascicular block  Narrative Interpretation:   Old EKG Reviewed: none available ABNORMAL   MDM  This generally well-appearing female presents with concerns over ongoing palpitations.  Notably, the patient has a history of palpitations for years, but it is only over the past several  days the palpitations have become more present.  She denies any pain, dyspnea, fever, chills, near syncope, or other concerning prescriptions of her palpitations.  On exam the patient is in no distress with unremarkable physical exam findings.  The patient's labs are reassuring.  The patient's mildly abnormal urinalysis and is likely due to today's endometrial biopsy.  Patient will require additional evaluation of her post menstrual bleeding as well as her palpitations.  I discussed return precautions, and the need to call her physician tomorrow to continue evaluation of the palpitations, specifically to discuss endocrine evaluation, TSH was sent, and consider continuous cardiac monitoring.      Gerhard Munch, MD 07/08/11 2300

## 2011-07-08 NOTE — ED Notes (Addendum)
Pt states she has been weak and tired for years. States she has had biopsy today for endometriosis. Pt also states she has been having menopause for 6 years. Pt very talkative.

## 2011-07-08 NOTE — Discharge Instructions (Signed)
It is very important to speak with your physician tomorrow to ensure appropriate ongoing evaluation of your palpitations.  As we discussed, there are multiple are multiple causes of palpitations.  If you develop any new, or concerning changes in your condition, prior to the physician, please return to emergency department.Palpitations  A palpitation is the feeling that your heartbeat is irregular or is faster than normal. Although this is frightening, it usually is not serious. Palpitations may be caused by excesses of smoking, caffeine, or alcohol. They are also brought on by stress and anxiety. Sometimes, they are caused by heart disease. Unless otherwise noted, your caregiver did not find any signs of serious illness at this time. HOME CARE INSTRUCTIONS  To help prevent palpitations:  Drink decaffeinated coffee, tea, and soda pop. Avoid chocolate.   If you smoke or drink alcohol, quit or cut down as much as possible.   Reduce your stress or anxiety level. Biofeedback, yoga, or meditation will help you relax. Physical activity such as swimming, jogging, or walking also may be helpful.  SEEK MEDICAL CARE IF:   You continue to have a fast heartbeat.   Your palpitations occur more often.  SEEK IMMEDIATE MEDICAL CARE IF: You develop chest pain, shortness of breath, severe headache, dizziness, or fainting. Document Released: 03/12/2000 Document Revised: 03/04/2011 Document Reviewed: 05/12/2007 [facility] Patient Information 2012 [place], [place].

## 2011-07-08 NOTE — ED Notes (Signed)
Pt made aware of need for urine. Pt attempted to urinate in bathroom. Unable to pee in cup d/t "hands are too short to get it under there" nurse notified.

## 2011-07-09 LAB — T4, FREE: Free T4: 1.24 ng/dL (ref 0.80–1.80)

## 2011-07-09 LAB — TSH: TSH: 0.794 u[IU]/mL (ref 0.350–4.500)

## 2011-07-21 ENCOUNTER — Encounter: Payer: Self-pay | Admitting: Gynecologic Oncology

## 2011-07-21 ENCOUNTER — Ambulatory Visit: Payer: Medicaid Other | Attending: Gynecologic Oncology | Admitting: Gynecologic Oncology

## 2011-07-21 VITALS — BP 120/80 | HR 80 | Temp 97.9°F | Resp 14 | Ht 58.86 in | Wt 200.3 lb

## 2011-07-21 DIAGNOSIS — C541 Malignant neoplasm of endometrium: Secondary | ICD-10-CM | POA: Insufficient documentation

## 2011-07-21 DIAGNOSIS — C549 Malignant neoplasm of corpus uteri, unspecified: Secondary | ICD-10-CM | POA: Insufficient documentation

## 2011-07-21 DIAGNOSIS — I10 Essential (primary) hypertension: Secondary | ICD-10-CM | POA: Insufficient documentation

## 2011-07-21 DIAGNOSIS — G4730 Sleep apnea, unspecified: Secondary | ICD-10-CM | POA: Insufficient documentation

## 2011-07-21 NOTE — Progress Notes (Signed)
Consult Note: Gyn-Onc  [name] 56 y.o. female  CC:  Chief Complaint  Patient presents with  . Cancer    New pt    HPI: Patient is seen today in consultation at the request of Dr. Tamela Oddi. Should is a 56 year old gravida 1 para 1 has been bleeding for greater than one year's time. She was seen by Dr. Tamela Oddi and had an endometrial biopsy performed revealed a grade 1 endometrial carcinoma. It is for this reason she is referred to [name] today. It is very difficult to get a history from the patient as she is very tangential. It sounds like she went through menopause at the age of 54 she took hormone replacement therapy for about a month's time and isn't bleeding for greater than 9 months. She states that she was scared and never went to see a physician for this. She also is complaining of abdominal pain. She states that 3 years ago she was supposed to have a cholecystectomy.  Early she was scared and never went out for this. She states she does have known as fatty liver. Interval History:   Review of Systems: She vaginal bleeding. She is complaining of cardiac palpitations for about 2 weeks. She states she was seen by Dr. Rennis Golden approximately 2-3 weeks is not sure she mentioned that her palpitations 10. She is status post a fall. She was seen emergency room and mentioned this to them. Per report there were that'll put a Holter monitor on her but then that was not accomplished. She occasionally has some chest pains but she's unsure of the chest pain is related to the abdominal pain that she has or not. She states she does have sleep apnea she stopped using her CPAP machine. She is very tired during the day she so tired she states "she can't take a bath".  She complains of back pain a right-sided abdominal pain. She states that she is spinal stenosis both of her lumbar spine as well as her C-spine osteoarthritis and bulging discs. Change in her bowel or bladder habits any unintentional  weight loss weight gain.  Current Meds:  Outpatient Encounter Prescriptions as of 07/21/2011  Medication Sig Dispense Refill  . ibuprofen (ADVIL,MOTRIN) 200 MG tablet Take 400 mg by mouth at bedtime as needed. Back pain.      Marland Kitchen levothyroxine (SYNTHROID, LEVOTHROID) 125 MCG tablet Take 125 mcg by mouth daily.        Marland Kitchen LORazepam (ATIVAN) 1 MG tablet Take 1 mg by mouth 2 (two) times daily.        . sertraline (ZOLOFT) 100 MG tablet Take 200 mg by mouth daily.       Marland Kitchen thiothixene (NAVANE) 2 MG capsule Take 2 mg by mouth at bedtime.       . Vitamin D, Ergocalciferol, (DRISDOL) 50000 UNITS CAPS Take 50,000 Units by mouth every 7 (seven) days. On Monday.        Allergy:  Allergies  Allergen Reactions  . Sulfamethoxazole W/Trimethoprim     Social Hx:   History   Social History  . Marital Status: Single    Spouse Name: N/A    Number of Children: N/A  . Years of Education: N/A   Occupational History  . Not on file.   Social History Main Topics  . Smoking status: Former Smoker    Quit date: 03/30/1963  . Smokeless tobacco: Never Used  . Alcohol Use: No  . Drug Use: Not on file  .  Sexually Active: Not on file   Other Topics Concern  . Not on file   Social History Narrative  . No narrative on file    Past Surgical Hx:  Past Surgical History  Procedure Date  . Cesarean section     Past Medical Hx:  Past Medical History  Diagnosis Date  . Sleep apnea   . Hypertension   . Thyroid disease   . Arthritis   . Asthma   . Depression   . Anxiety   . Fibromyalgia   . Lumbar spondylolysis   . Allergy     Family Hx: No family history on file.  Vitals:  Blood pressure 120/80, pulse 80, temperature 97.9 F (36.6 C), temperature source Oral, resp. rate 14, height 4' 10.86" (1.495 m), weight 200 lb 4.8 oz (90.855 kg).  Physical Exam: First well-developed female in no acute distress.  Neck: No lymphadenopathy no thyromegaly.  Lungs: Auscultation  bilaterally.  Cardiovascular regular rate rhythm without murmur. No tachycardia no palpitations noted.  Abdomen: Mildly obese. Soft nontender nondistended. There no palpable mass of the exam is limited by habitus.  Groins: Candida infection in the left intertriginous.  Extremities: 1+ nonpitting edema.  Pelvic: Normal external female genitalia. The vagina is atrophic the cervix is visualized is no visible lesions. Bimanual examination is limited by habitus is no nodularity.  Assessment/Plan:  Extremities gravida 1 para 1 with history of grade 1 endometrial carcinoma clinical stage one. I discussed with her proceeding with robotic-assisted hysterectomy BSO. The uterus be sent for frozen section and lipase lymphadenectomy based on this. She understands he may need to be a abdominal incision secondary to visualization due to her obesity. We also discussed that she should be using her CPAP machine as untreated sleep apnea carries with it significant medical complications. We also discussed her heart palpitations. She's not sure if she discussed this with Dr. Rennis Golden when she recently saw him. We have contacted with Dr. Blanchie Dessert office as well as Dr. Rueben Bash office for assistance with preoperative evaluation. Risks and benefits of surgery were discussed with the patient and she very much wishes to proceed as soon as possible but understands the need to evaluate her other medical issues. Shelbia Scinto A., MD 07/21/2011, 4:01 PM

## 2011-07-21 NOTE — Patient Instructions (Signed)
F/u as scheduled

## 2011-07-22 ENCOUNTER — Telehealth: Payer: Self-pay | Admitting: Gynecologic Oncology

## 2011-07-22 NOTE — Telephone Encounter (Signed)
Spoke with patient about upcoming appointments for pre-surgical clearance.   Pt to see Dr. Concepcion Elk on July 26, 2011 at 2:45pm and Dr. Italy Hilty (cardiologist) on Jul 29, 2011 at 2:30pm.  Pt understanding the importance of going to the above appointments in order to keep her surgery on schedule.  Pt stating that she wrote the dates and times down.

## 2011-07-26 ENCOUNTER — Emergency Department (HOSPITAL_COMMUNITY)
Admission: EM | Admit: 2011-07-26 | Discharge: 2011-07-27 | Disposition: A | Discharge status: Home / Self Care | Payer: Medicaid Other | Attending: Emergency Medicine | Admitting: Emergency Medicine

## 2011-07-26 ENCOUNTER — Emergency Department (HOSPITAL_COMMUNITY): Payer: Medicaid Other

## 2011-07-26 ENCOUNTER — Other Ambulatory Visit: Payer: Self-pay

## 2011-07-26 ENCOUNTER — Other Ambulatory Visit (HOSPITAL_COMMUNITY): Payer: Self-pay | Admitting: Pharmacy Technician

## 2011-07-26 ENCOUNTER — Encounter (HOSPITAL_COMMUNITY): Payer: Self-pay | Admitting: Emergency Medicine

## 2011-07-26 DIAGNOSIS — E079 Disorder of thyroid, unspecified: Secondary | ICD-10-CM | POA: Insufficient documentation

## 2011-07-26 DIAGNOSIS — C549 Malignant neoplasm of corpus uteri, unspecified: Secondary | ICD-10-CM | POA: Insufficient documentation

## 2011-07-26 DIAGNOSIS — F3289 Other specified depressive episodes: Secondary | ICD-10-CM | POA: Insufficient documentation

## 2011-07-26 DIAGNOSIS — M129 Arthropathy, unspecified: Secondary | ICD-10-CM | POA: Insufficient documentation

## 2011-07-26 DIAGNOSIS — F411 Generalized anxiety disorder: Secondary | ICD-10-CM | POA: Insufficient documentation

## 2011-07-26 DIAGNOSIS — R1013 Epigastric pain: Secondary | ICD-10-CM | POA: Insufficient documentation

## 2011-07-26 DIAGNOSIS — I10 Essential (primary) hypertension: Secondary | ICD-10-CM | POA: Insufficient documentation

## 2011-07-26 DIAGNOSIS — J45909 Unspecified asthma, uncomplicated: Secondary | ICD-10-CM | POA: Insufficient documentation

## 2011-07-26 DIAGNOSIS — R002 Palpitations: Secondary | ICD-10-CM | POA: Insufficient documentation

## 2011-07-26 DIAGNOSIS — IMO0001 Reserved for inherently not codable concepts without codable children: Secondary | ICD-10-CM | POA: Insufficient documentation

## 2011-07-26 DIAGNOSIS — F329 Major depressive disorder, single episode, unspecified: Secondary | ICD-10-CM | POA: Insufficient documentation

## 2011-07-26 DIAGNOSIS — R079 Chest pain, unspecified: Secondary | ICD-10-CM | POA: Insufficient documentation

## 2011-07-26 DIAGNOSIS — Z79899 Other long term (current) drug therapy: Secondary | ICD-10-CM | POA: Insufficient documentation

## 2011-07-26 HISTORY — DX: Malignant neoplasm of endometrium: C54.1

## 2011-07-26 LAB — CBC
Hemoglobin: 14.5 g/dL (ref 12.0–15.0)
MCHC: 34.9 g/dL (ref 30.0–36.0)
RDW: 13.1 % (ref 11.5–15.5)
WBC: 10.2 10*3/uL (ref 4.0–10.5)

## 2011-07-26 LAB — DIFFERENTIAL
Basophils Absolute: 0 10*3/uL (ref 0.0–0.1)
Basophils Relative: 0 % (ref 0–1)
Lymphocytes Relative: 40 % (ref 12–46)
Neutro Abs: 4.7 10*3/uL (ref 1.7–7.7)
Neutrophils Relative %: 46 % (ref 43–77)

## 2011-07-26 LAB — COMPREHENSIVE METABOLIC PANEL
ALT: 29 U/L (ref 0–35)
AST: 28 U/L (ref 0–37)
Albumin: 4 g/dL (ref 3.5–5.2)
Alkaline Phosphatase: 55 U/L (ref 39–117)
Chloride: 104 mEq/L (ref 96–112)
Potassium: 4.3 mEq/L (ref 3.5–5.1)
Total Bilirubin: 0.3 mg/dL (ref 0.3–1.2)

## 2011-07-26 MED ORDER — SODIUM CHLORIDE 0.9 % IV BOLUS (SEPSIS)
500.0000 mL | Freq: Once | INTRAVENOUS | Status: AC
  Administered 2011-07-26: 500 mL via INTRAVENOUS

## 2011-07-26 MED ORDER — HYDROMORPHONE HCL PF 2 MG/ML IJ SOLN
2.0000 mg | Freq: Once | INTRAMUSCULAR | Status: AC
  Administered 2011-07-26: 2 mg via INTRAVENOUS
  Filled 2011-07-26: qty 1

## 2011-07-26 MED ORDER — ONDANSETRON HCL 4 MG/2ML IJ SOLN
4.0000 mg | Freq: Once | INTRAMUSCULAR | Status: AC
  Administered 2011-07-26: 4 mg via INTRAVENOUS
  Filled 2011-07-26: qty 2

## 2011-07-26 NOTE — ED Notes (Signed)
Pt states she went to her PCP today and was sent here for further evaluation.  Pt states she is having pain in her RUQ that radiates around into her back that started on Friday  Pt states she was told years ago her gallbladder was bad  Pt states she also has been having heart palpitations for the past 3 weeks  Pt states she has endometrial cancer and is supposed to have surgery next Tuesday if she is approved due to her heart palpitations  Pt referred here by Dr Alphonse Guild at [facility]

## 2011-07-26 NOTE — ED Provider Notes (Addendum)
History     CSN: [id number]  Arrival date & time 07/26/11  4098   First MD Initiated Contact with Patient 07/26/11 2038      Chief Complaint  Patient presents with  . Abdominal Pain  . Palpitations    (Consider location/radiation/quality/duration/timing/severity/associated sxs/prior treatment) HPI  Pt presents to the ED with complaints of severe mid epigastric pain and heart palpitations.  Heart Palpitations:  This generally well-appearing female presents with concerns over ongoing palpitations. Notably, the patient has a history of palpitations for years, but it is only over the past several days the palpitations have become more present. She denies any pain, dyspnea, fever, chills, near syncope, or other concerning prescriptions of her palpitations. On exam the patient is in no distress with unremarkable physical exam findings. The patient states that they have been more noticeable and worse for the past 3 weeks. She has been seen in the ED for this problem and evaluated. She has an appointment with her cardiologist this Thursday to be given a heart monitor for further evaluation. TSH was sent out on 07/08/2011 which came back within normal ranges.  Abdominal pain: Patient presents to the ED with complaints mid epigastric pain. She has known endometrial cancer for which she is supposed to undergo surgery, however, she is seeing a Cardiologist for cardiac clearance first, due to her heart palpitations. She states that the symptoms started 3 days ago and radiate towards her back. She called her PCP who told her to come to the ER. The patient denies vomiting, diarrhea, chills, nausea, fevers. She denies anything making the pain better or worse. She is NAD at this time.  Past Medical History  Diagnosis Date  . Sleep apnea   . Hypertension   . Thyroid disease   . Arthritis   . Asthma   . Depression   . Anxiety   . Fibromyalgia   . Lumbar spondylolysis   . Allergy   . Endometrial  cancer     Past Surgical History  Procedure Date  . Cesarean section     Family History  Problem Relation Age of Onset  . Hypertension Mother   . Coronary artery disease Father   . Cancer Brother   . Hypertension Other   . Coronary artery disease Other   . Cancer Other     History  Substance Use Topics  . Smoking status: Former Smoker    Quit date: 03/30/1963  . Smokeless tobacco: Never Used  . Alcohol Use: No    OB History    Grav Para Term Preterm Abortions TAB SAB Ect Mult Living                  Review of Systems  HEENT: denies blurry vision or change in hearing PULMONARY: Denies difficulty breathing and SOB CARDIAC: denies chest pain or heart palpitations MUSCULOSKELETAL:  denies being unable to ambulate ABDOMEN AL: + abdominal pains, no vomiting or diarrhea GU: denies loss of bowel or urinary control NEURO: denies numbness and tingling in extremities SKIN: no new rashes PSYCH: patient denies anxiety or depression. NECK: Pt denies having neck pain    Allergies  Sulfamethoxazole w-trimethoprim  Home Medications   Current Outpatient Rx  Name Route Sig Dispense Refill  . IBUPROFEN 200 MG PO TABS Oral Take 400 mg by mouth at bedtime as needed. Back pain.    Marland Kitchen LEVOTHYROXINE SODIUM 125 MCG PO TABS Oral Take 125 mcg by mouth daily.      Marland Kitchen LORAZEPAM  1 MG PO TABS Oral Take 1 mg by mouth 2 (two) times daily.      . SERTRALINE HCL 100 MG PO TABS Oral Take 200 mg by mouth daily.     . THIOTHIXENE 2 MG PO CAPS Oral Take 2 mg by mouth at bedtime.     Marland Kitchen VITAMIN D (ERGOCALCIFEROL) 50000 UNITS PO CAPS Oral Take 50,000 Units by mouth every 7 (seven) days. On Monday.    Marland Kitchen OMEPRAZOLE 20 MG PO CPDR Oral Take 1 capsule (20 mg total) by mouth daily. 30 capsule 0  . ONDANSETRON HCL 4 MG PO TABS Oral Take 1 tablet (4 mg total) by mouth every 6 (six) hours. 12 tablet 0  . OXYCODONE-ACETAMINOPHEN 5-325 MG PO TABS Oral Take 1 tablet by mouth every 6 (six) hours as needed for  pain. 15 tablet 0    BP 118/70  Pulse 98  Temp(Src) 98.4 F (36.9 C) (Oral)  Resp 14  Ht 4\' 11"  (1.499 m)  Wt 198 lb (89.812 kg)  BMI 39.99 kg/m2  SpO2 97%  Physical Exam  Nursing note and vitals reviewed. Constitutional: She appears well-developed and well-nourished. No distress.  HENT:  Head: Normocephalic and atraumatic.  Eyes: Pupils are equal, round, and reactive to light.  Neck: Normal range of motion. Neck supple.  Cardiovascular: Normal rate and regular rhythm.   Pulmonary/Chest: Effort normal.  Abdominal: Soft. Bowel sounds are normal. She exhibits no distension and no fluid wave. There is tenderness in the right upper quadrant. There is no rigidity, no rebound, no guarding, no CVA tenderness and negative Murphy's sign. No hernia.    Neurological: She is alert.  Skin: Skin is warm and dry.    ED Course  Procedures (including critical care time)  Labs Reviewed  LIPASE, BLOOD - Abnormal; Notable for the following:    Lipase 64 (*)    All other components within normal limits  CBC  DIFFERENTIAL  COMPREHENSIVE METABOLIC PANEL  TROPONIN I  POCT I-STAT TROPONIN I   Dg Chest 2 View  07/26/2011  *RADIOLOGY REPORT*  Clinical Data: Chest pain, heart palpitations.  CHEST - 2 VIEW  Comparison: 07/08/2011  Findings: Heart size upper normal to mildly enlarged.  Mild linear opacity within the lingula is likely scarring or atelectasis. Otherwise, no focal consolidation.  No pleural effusion or pneumothorax.  Multilevel degenerative change without acute osseous abnormality identified.  IMPRESSION: Heart size upper normal to mildly enlarged without acute process identified.  Original Report Authenticated By: Waneta Martins, M.D.   US Abdomen Complete  07/26/2011  *RADIOLOGY REPORT*  Clinical Data:  Epigastric abdominal pain, history of endometrial cancer  COMPLETE ABDOMINAL ULTRASOUND  Comparison:  Abdominal ultrasound - 11/24/2007  Findings:  Examination degraded secondary  to patient body habitus.  Gallbladder:  Limited views appear normal.  No definite gallbladder wall thickening.  No definite echogenic stones.  No pericholecystic fluid.  Negative sonographic Murphy's sign.  Common bile duct:  Appears normal measuring 4 mm in diameter.  Liver:  Suboptimally evaluated due to patient body habitus.  IVC:  Non-visualized  Pancreas:  Non-visualized  Spleen:  Normal, measuring 12.5 cm in length  Right Kidney:  Suboptimally evaluated.  Left Kidney:  Normal cortical thickness, echogenicity and size, measuring 11.1 cm in length.  No focal renal lesions.  No echogenic renal stones.  No urinary obstruction.  Abdominal aorta:  Not visualized.  IMPRESSION: 1.  Overall degraded examination secondary to patient body habitus and poor sonographic window. 2.  No specific explanation for patient's abdominal pain. Specifically normal appearance of the gallbladder.  Original Report Authenticated By: Waynard Reeds, M.D.     1. Abdominal pain       MDM     Date: 07/26/2011  Rate: 106  Rhythm: sinus tachycardia  QRS Axis: left  Intervals: normal  ST/T Wave abnormalities: normal  Conduction Disutrbances:none  Narrative Interpretation:   Old EKG Reviewed: unchanged heart rate improvement from July 08, 2011   9:09PM: patient initial evaluation. Labs, Chest xray and abd ultrasound ordered. R/o cardiac etiology, gall bladder and pancreatitis.    12:37: pt re-evaluated and found to be feeling better. Her lipase is slightly elevated at 64. PT denies being a heavy drinker. Her pain is controlled at this time and I feel that outpatient GI follow-up is reasonable at this time. This patient has been discussed with Dr. Rubin Payor who agrees with my follow-up and plan.  Pt given Rx for Percocet and Zofran.  Pt has been advised of the symptoms that warrant their return to the ED. Patient has voiced understanding and has agreed to follow-up with the PCP or specialist.     Dorthula Matas, PA 07/27/11 2841  Dorthula Matas, PA 11/26/11 [phone]

## 2011-07-27 MED ORDER — HYDROMORPHONE HCL PF 1 MG/ML IJ SOLN
1.0000 mg | Freq: Once | INTRAMUSCULAR | Status: DC
  Filled 2011-07-27: qty 1

## 2011-07-27 MED ORDER — OMEPRAZOLE 20 MG PO CPDR: 20.0000 mg | DELAYED_RELEASE_CAPSULE | Freq: Every day | ORAL | Status: DC

## 2011-07-27 MED ORDER — GI COCKTAIL ~~LOC~~: 30.0000 mL | Freq: Once | ORAL | Status: DC

## 2011-07-27 MED ORDER — OXYCODONE-ACETAMINOPHEN 5-325 MG PO TABS: 1.0000 | ORAL_TABLET | Freq: Four times a day (QID) | ORAL | Status: DC | PRN

## 2011-07-27 MED ORDER — ONDANSETRON HCL 4 MG PO TABS: 4.0000 mg | ORAL_TABLET | Freq: Four times a day (QID) | ORAL | Status: DC

## 2011-07-27 MED ORDER — LANSOPRAZOLE 30 MG PO TBDP: 30.0000 mg | ORAL_TABLET | Freq: Every day | ORAL | Status: DC

## 2011-07-27 MED ORDER — HYDROMORPHONE HCL PF 1 MG/ML IJ SOLN: 0.5000 mg | Freq: Once | INTRAMUSCULAR | Status: DC

## 2011-07-27 NOTE — ED Notes (Signed)
Pt stable, pt ambulatory.  Pt mom at bedside.  Pt verbalizes understanding

## 2011-07-27 NOTE — Discharge Instructions (Signed)

## 2011-07-29 ENCOUNTER — Encounter: Payer: Self-pay | Admitting: Gynecologic Oncology

## 2011-07-29 ENCOUNTER — Encounter (HOSPITAL_COMMUNITY): Payer: Self-pay | Admitting: Pharmacy Technician

## 2011-07-29 NOTE — ED Provider Notes (Signed)
Medical screening examination/treatment/procedure(s) were performed by non-physician practitioner and as supervising physician I was immediately available for consultation/collaboration.  Juliet Rude. Rubin Payor, MD 07/29/11 1531

## 2011-07-29 NOTE — Progress Notes (Signed)
Spoke with Jasmine December at Dr. Italy Hilty's (cardiologist) office.  Stating that the patient must undergo a stress test and 48 hour holter monitor testing before she could receive cardiac clearance for surgery.  Pre-surgical appointment for 07/30/11 and surgery scheduled for 08/03/11 cancelled at 15:19.  Unable to reach patient on her mobile number but message left with her mother at home stating that the pre-surgical testing appointment for 07/30/11 and the surgery on 08/03/11 had been cancelled at this time.  Informed that when surgical clearance had been achieved, surgery would be rescheduled.  No concerns or questions voiced.  Mother stating that she would inform the patient when she arrived home.  Instructed to call the office for any questions or concerns.

## 2011-07-30 ENCOUNTER — Other Ambulatory Visit (HOSPITAL_COMMUNITY): Payer: Medicaid Other

## 2011-08-03 ENCOUNTER — Ambulatory Visit (HOSPITAL_COMMUNITY)
Admission: RE | Admit: 2011-08-03 | Payer: Medicaid Other | Source: Ambulatory Visit | Admitting: Obstetrics & Gynecology

## 2011-08-03 ENCOUNTER — Encounter (HOSPITAL_COMMUNITY): Admission: RE | Payer: Self-pay | Source: Ambulatory Visit

## 2011-08-03 SURGERY — ROBOTIC ASSISTED TOTAL HYSTERECTOMY WITH BILATERAL SALPINGO OOPHORECTOMY
Anesthesia: General | Laterality: Bilateral

## 2011-08-17 ENCOUNTER — Telehealth: Payer: Self-pay | Admitting: Gynecologic Oncology

## 2011-08-17 NOTE — Telephone Encounter (Signed)
Spoke with patient about current status of cardiac testing to achieve surgical clearance.  Pt stating that "I'm not doing good.  Its a long story and I'm eating now so call me back.  I got mad up there because how they treated me.  So they dismissed me as a patient.  Well..the security guard up there was going to go back in there and talk to them.  They are supposed to be sending me a letter or something.  Call me back later."  Pt stating that she has still not completed the recommended holter monitor testing.  Plan to follow up with patient at a later time.

## 2011-08-19 ENCOUNTER — Telehealth: Payer: Self-pay | Admitting: Gynecologic Oncology

## 2011-08-19 NOTE — Telephone Encounter (Signed)
Pt informed of upcoming appointment scheduled with Dr. Antionette Char for an IUD placement.  Informed that the IUD placement would assist in controlling the endometrial cancer until plans for surgery can be finalized.  Pt verbalizing understanding.  No concerns or questions voiced.

## 2011-08-20 ENCOUNTER — Encounter: Payer: Self-pay | Admitting: Gynecologic Oncology

## 2011-08-20 NOTE — Progress Notes (Signed)
Spoke with Dr. Antionette Char about pt.  Pt called and canceled her appointment on June 5th for IUD placement and informed Dr. Tamela Oddi that she had contacted a cardiologist who would see her to work on surgical clearance.

## 2011-08-22 ENCOUNTER — Encounter (HOSPITAL_COMMUNITY): Payer: Self-pay

## 2011-08-22 ENCOUNTER — Emergency Department (HOSPITAL_COMMUNITY)
Admission: EM | Admit: 2011-08-22 | Discharge: 2011-08-23 | Disposition: A | Discharge status: Home / Self Care | Payer: Medicaid Other | Attending: Emergency Medicine | Admitting: Emergency Medicine

## 2011-08-22 DIAGNOSIS — R10819 Abdominal tenderness, unspecified site: Secondary | ICD-10-CM | POA: Insufficient documentation

## 2011-08-22 DIAGNOSIS — K5289 Other specified noninfective gastroenteritis and colitis: Secondary | ICD-10-CM | POA: Insufficient documentation

## 2011-08-22 DIAGNOSIS — R109 Unspecified abdominal pain: Secondary | ICD-10-CM | POA: Insufficient documentation

## 2011-08-22 DIAGNOSIS — M549 Dorsalgia, unspecified: Secondary | ICD-10-CM | POA: Insufficient documentation

## 2011-08-22 DIAGNOSIS — R51 Headache: Secondary | ICD-10-CM | POA: Insufficient documentation

## 2011-08-22 DIAGNOSIS — I10 Essential (primary) hypertension: Secondary | ICD-10-CM | POA: Insufficient documentation

## 2011-08-22 DIAGNOSIS — Z8544 Personal history of malignant neoplasm of other female genital organs: Secondary | ICD-10-CM | POA: Insufficient documentation

## 2011-08-22 DIAGNOSIS — R002 Palpitations: Secondary | ICD-10-CM | POA: Insufficient documentation

## 2011-08-22 NOTE — ED Notes (Signed)
Pt presents by EMS with no acute distress.  Generalized back, stomach and chest pain for 1 month- increased pain today- denies injury.  Pt denies N/V/D and fever and urinary problems

## 2011-08-22 NOTE — ED Notes (Addendum)
Per EMS pt is hurting in her head, back, abdomen, and chest and has been for 4 months. 11/10 on pain scale. Got dilaudid last time and it helped. Pt was dropped by her PCP bc of argument. Just found out she has endometrial cancer.

## 2011-08-23 ENCOUNTER — Encounter (HOSPITAL_COMMUNITY): Payer: Self-pay | Admitting: Radiology

## 2011-08-23 ENCOUNTER — Emergency Department (HOSPITAL_COMMUNITY): Payer: Medicaid Other

## 2011-08-23 LAB — COMPREHENSIVE METABOLIC PANEL
ALT: 29 U/L (ref 0–35)
CO2: 27 mEq/L (ref 19–32)
Calcium: 9.1 mg/dL (ref 8.4–10.5)
GFR calc Af Amer: >90 mL/min (ref >90)
GFR calc non Af Amer: >90 mL/min (ref >90)
Glucose, Bld: 89 mg/dL (ref 70–99)
Sodium: 139 mEq/L (ref 135–145)

## 2011-08-23 LAB — CBC
HCT: 42.3 % (ref 36.0–46.0)
Hemoglobin: 14.7 g/dL (ref 12.0–15.0)
MCV: 86.7 fL (ref 78.0–100.0)
Platelets: 220 10*3/uL (ref 150–400)
RBC: 4.88 MIL/uL (ref 3.87–5.11)
WBC: 8.7 10*3/uL (ref 4.0–10.5)

## 2011-08-23 LAB — URINALYSIS, ROUTINE W REFLEX MICROSCOPIC
Bilirubin Urine: NEGATIVE
Hgb urine dipstick: NEGATIVE
Protein, ur: NEGATIVE mg/dL
Urobilinogen, UA: 0.2 mg/dL (ref 0.0–1.0)

## 2011-08-23 LAB — DIFFERENTIAL
Eosinophils Relative: 5 % (ref 0–5)
Lymphocytes Relative: 44 % (ref 12–46)
Lymphs Abs: 3.8 10*3/uL (ref 0.7–4.0)
Monocytes Absolute: 0.6 10*3/uL (ref 0.1–1.0)
Monocytes Relative: 7 % (ref 3–12)

## 2011-08-23 LAB — URINE MICROSCOPIC-ADD ON

## 2011-08-23 MED ORDER — SODIUM CHLORIDE 0.9 % IV BOLUS (SEPSIS)
1000.0000 mL | Freq: Once | INTRAVENOUS | Status: AC
  Administered 2011-08-23: 1000 mL via INTRAVENOUS

## 2011-08-23 MED ORDER — OXYCODONE-ACETAMINOPHEN 5-325 MG PO TABS: 1.0000 | ORAL_TABLET | Freq: Four times a day (QID) | ORAL | Status: AC | PRN

## 2011-08-23 MED ORDER — OXYCODONE-ACETAMINOPHEN 5-325 MG PO TABS
1.0000 | ORAL_TABLET | Freq: Once | ORAL | Status: DC
  Filled 2011-08-23: qty 1

## 2011-08-23 MED ORDER — ONDANSETRON HCL 4 MG/2ML IJ SOLN
4.0000 mg | Freq: Once | INTRAMUSCULAR | Status: AC
  Administered 2011-08-23: 4 mg via INTRAVENOUS
  Filled 2011-08-23: qty 2

## 2011-08-23 MED ORDER — IOHEXOL 300 MG/ML  SOLN
100.0000 mL | Freq: Once | INTRAMUSCULAR | Status: AC | PRN
  Administered 2011-08-23: 100 mL via INTRAVENOUS

## 2011-08-23 MED ORDER — HYDROMORPHONE HCL PF 1 MG/ML IJ SOLN
1.0000 mg | Freq: Once | INTRAMUSCULAR | Status: AC
  Administered 2011-08-23: 1 mg via INTRAVENOUS
  Filled 2011-08-23: qty 1

## 2011-08-23 NOTE — ED Provider Notes (Signed)
History     CSN: [id number]  Arrival date & time 08/22/11  2210   First MD Initiated Contact with Patient 08/23/11 0136      Chief Complaint  Patient presents with  . Headache  . Palpitations  . Abdominal Pain  . Back Pain    (Consider location/radiation/quality/duration/timing/severity/associated sxs/prior treatment) HPI Comments: Patient with multiple medical problems and multiple pain complaints who presents tonight with complaints of headache, palpitations back pain and diffuse abdominal pain.  She reports no fever, chills, neck stiffness, nausea, vomiting, chest pain, shortness of breath, constipation or diarrhea.  She is a difficult patient to get history from.  States that she has a history of endometrial cancer which she is supposed to be having surgery for.  She states that has not been scheduled yet because she is supposed to be wearing a holter monitor for her recurrent palpitations.  She states that she had an argument with her PCP so they have not scheduled this.    Patient is a 56 y.o. female presenting with headaches, palpitations, abdominal pain, and back pain. The history is provided by the patient and a parent. No language interpreter was used.  Headache  This is a new problem. The current episode started more than 1 week ago. The problem occurs constantly. The problem has not changed since onset.The headache is associated with emotional stress. The pain is located in the bilateral and parietal region. The quality of the pain is described as throbbing. The pain is at a severity of 10/10. The pain is severe. The pain does not radiate. Associated symptoms include palpitations. Pertinent negatives include no anorexia, no fever, no malaise/fatigue, no chest pressure, no near-syncope, no orthopnea, no syncope, no shortness of breath, no nausea and no vomiting. She has tried nothing for the symptoms. The treatment provided no relief.  Palpitations  Associated symptoms include  abdominal pain, headaches and back pain. Pertinent negatives include no diaphoresis, no fever, no malaise/fatigue, no chest pressure, no near-syncope, no orthopnea, no syncope, no nausea, no vomiting and no shortness of breath.  Abdominal Pain The primary symptoms of the illness include abdominal pain. The primary symptoms of the illness do not include fever, fatigue, shortness of breath, nausea, vomiting, diarrhea, hematemesis, hematochezia, vaginal discharge or vaginal bleeding. The current episode started more than 2 days ago. The onset of the illness was gradual. The problem has not changed since onset. The patient states that she believes she is currently not pregnant. The patient has not had a change in bowel habit. Additional symptoms associated with the illness include back pain. Symptoms associated with the illness do not include chills, anorexia, diaphoresis, heartburn, constipation, urgency, hematuria or frequency.  Back Pain  Associated symptoms include headaches and abdominal pain. Pertinent negatives include no fever.    Past Medical History  Diagnosis Date  . Sleep apnea   . Hypertension   . Thyroid disease   . Arthritis   . Asthma   . Depression   . Anxiety   . Fibromyalgia   . Lumbar spondylolysis   . Allergy   . Endometrial cancer     Past Surgical History  Procedure Date  . Cesarean section     Family History  Problem Relation Age of Onset  . Hypertension Mother   . Coronary artery disease Father   . Cancer Brother   . Hypertension Other   . Coronary artery disease Other   . Cancer Other     History  Substance Use  Topics  . Smoking status: Former Smoker    Quit date: 03/30/1963  . Smokeless tobacco: Never Used  . Alcohol Use: No    OB History    Grav Para Term Preterm Abortions TAB SAB Ect Mult Living                  Review of Systems  Constitutional: Negative for fever, chills, malaise/fatigue, diaphoresis and fatigue.  Respiratory: Negative  for shortness of breath.   Cardiovascular: Positive for palpitations. Negative for orthopnea, syncope and near-syncope.  Gastrointestinal: Positive for abdominal pain. Negative for heartburn, nausea, vomiting, diarrhea, constipation, hematochezia, anorexia and hematemesis.  Genitourinary: Negative for urgency, frequency, hematuria, vaginal bleeding and vaginal discharge.  Musculoskeletal: Positive for back pain.  Neurological: Positive for headaches.  All other systems reviewed and are negative.    Allergies  Sulfamethoxazole w-trimethoprim  Home Medications   Current Outpatient Rx  Name Route Sig Dispense Refill  . IBUPROFEN 200 MG PO TABS Oral Take 400 mg by mouth at bedtime. Back pain.    Marland Kitchen LEVOTHYROXINE SODIUM 125 MCG PO TABS Oral Take 125 mcg by mouth daily with breakfast.     . LORAZEPAM 1 MG PO TABS Oral Take 1 mg by mouth 2 (two) times daily.      . SERTRALINE HCL 100 MG PO TABS Oral Take 200 mg by mouth at bedtime.     . THIOTHIXENE 2 MG PO CAPS Oral Take 2 mg by mouth at bedtime.       BP 127/88  Pulse 75  Temp(Src) 98.1 F (36.7 C) (Oral)  Resp 18  SpO2 97%  Physical Exam  Nursing note and vitals reviewed. Constitutional: She is oriented to person, place, and time. She appears well-developed and well-nourished. No distress.       hysterical  HENT:  Head: Normocephalic and atraumatic.  Right Ear: External ear normal.  Left Ear: External ear normal.  Nose: Nose normal.  Mouth/Throat: Oropharynx is clear and moist. No oropharyngeal exudate.  Eyes: Conjunctivae are normal. Pupils are equal, round, and reactive to light. No scleral icterus.  Neck: Normal range of motion. Neck supple. No spinous process tenderness and no muscular tenderness present. No Brudzinski's sign and no Kernig's sign noted.  Cardiovascular: Normal rate, regular rhythm and normal heart sounds.  Exam reveals no gallop and no friction rub.   No murmur heard. Pulmonary/Chest: Effort normal and  breath sounds normal. No respiratory distress. She has no wheezes. She has no rales. She exhibits no tenderness.  Abdominal: Soft. Bowel sounds are normal. She exhibits no distension and no mass. There is tenderness. There is no rebound and no guarding.       Diffuse tenderness to palpation wtihout guarding rebound,   Musculoskeletal: Normal range of motion. She exhibits no edema and no tenderness.  Lymphadenopathy:    She has no cervical adenopathy.  Neurological: She is alert and oriented to person, place, and time. No cranial nerve deficit. She exhibits normal muscle tone. Coordination normal.  Skin: Skin is warm and dry. No rash noted. No erythema. No pallor.  Psychiatric: She has a normal mood and affect.       Tangential thought process -     ED Course  Procedures (including critical care time)  Labs Reviewed  URINALYSIS, ROUTINE W REFLEX MICROSCOPIC - Abnormal; Notable for the following:    Leukocytes, UA LARGE (*)    All other components within normal limits  URINE MICROSCOPIC-ADD ON - Abnormal; Notable for  the following:    Bacteria, UA MANY (*)    All other components within normal limits  CBC  DIFFERENTIAL  COMPREHENSIVE METABOLIC PANEL  LIPASE, BLOOD  URINE CULTURE   No results found.  Results for orders placed during the hospital encounter of 08/22/11  CBC      Component Value Range   WBC 8.7  4.0 - 10.5 (K/uL)   RBC 4.88  3.87 - 5.11 (MIL/uL)   Hemoglobin 14.7  12.0 - 15.0 (g/dL)   HCT 16.1  09.6 - 04.5 (%)   MCV 86.7  78.0 - 100.0 (fL)   MCH 30.1  26.0 - 34.0 (pg)   MCHC 34.8  30.0 - 36.0 (g/dL)   RDW 40.9  81.1 - 91.4 (%)   Platelets 220  150 - 400 (K/uL)  DIFFERENTIAL      Component Value Range   Neutrophils Relative 44  43 - 77 (%)   Neutro Abs 3.8  1.7 - 7.7 (K/uL)   Lymphocytes Relative 44  12 - 46 (%)   Lymphs Abs 3.8  0.7 - 4.0 (K/uL)   Monocytes Relative 7  3 - 12 (%)   Monocytes Absolute 0.6  0.1 - 1.0 (K/uL)   Eosinophils Relative 5  0 - 5  (%)   Eosinophils Absolute 0.4  0.0 - 0.7 (K/uL)   Basophils Relative 0  0 - 1 (%)   Basophils Absolute 0.0  0.0 - 0.1 (K/uL)  COMPREHENSIVE METABOLIC PANEL      Component Value Range   Sodium 139  135 - 145 (mEq/L)   Potassium 3.9  3.5 - 5.1 (mEq/L)   Chloride 102  96 - 112 (mEq/L)   CO2 27  19 - 32 (mEq/L)   Glucose, Bld 89  70 - 99 (mg/dL)   BUN 9  6 - 23 (mg/dL)   Creatinine, Ser 7.82  0.50 - 1.10 (mg/dL)   Calcium 9.1  8.4 - 95.6 (mg/dL)   Total Protein 7.7  6.0 - 8.3 (g/dL)   Albumin 4.3  3.5 - 5.2 (g/dL)   AST 26  0 - 37 (U/L)   ALT 29  0 - 35 (U/L)   Alkaline Phosphatase 55  39 - 117 (U/L)   Total Bilirubin 0.3  0.3 - 1.2 (mg/dL)   GFR calc non Af Amer >90  >90 (mL/min)   GFR calc Af Amer >90  >90 (mL/min)  LIPASE, BLOOD      Component Value Range   Lipase 38  11 - 59 (U/L)  URINALYSIS, ROUTINE W REFLEX MICROSCOPIC      Component Value Range   Color, Urine YELLOW  YELLOW    APPearance CLEAR  CLEAR    Specific Gravity, Urine 1.015  1.005 - 1.030    pH 5.5  5.0 - 8.0    Glucose, UA NEGATIVE  NEGATIVE (mg/dL)   Hgb urine dipstick NEGATIVE  NEGATIVE    Bilirubin Urine NEGATIVE  NEGATIVE    Ketones, ur NEGATIVE  NEGATIVE (mg/dL)   Protein, ur NEGATIVE  NEGATIVE (mg/dL)   Urobilinogen, UA 0.2  0.0 - 1.0 (mg/dL)   Nitrite NEGATIVE  NEGATIVE    Leukocytes, UA LARGE (*) NEGATIVE   URINE MICROSCOPIC-ADD ON      Component Value Range   Squamous Epithelial / LPF RARE  RARE    WBC, UA 21-50  <3 (WBC/hpf)   RBC / HPF 7-10  <3 (RBC/hpf)   Bacteria, UA MANY (*) RARE    Urine-Other MUCOUS  PRESENT     Dg Chest 2 View  07/26/2011  *RADIOLOGY REPORT*  Clinical Data: Chest pain, heart palpitations.  CHEST - 2 VIEW  Comparison: 07/08/2011  Findings: Heart size upper normal to mildly enlarged.  Mild linear opacity within the lingula is likely scarring or atelectasis. Otherwise, no focal consolidation.  No pleural effusion or pneumothorax.  Multilevel degenerative change without acute  osseous abnormality identified.  IMPRESSION: Heart size upper normal to mildly enlarged without acute process identified.  Original Report Authenticated By: Waneta Martins, M.D.   US Abdomen Complete  07/26/2011  *RADIOLOGY REPORT*  Clinical Data:  Epigastric abdominal pain, history of endometrial cancer  COMPLETE ABDOMINAL ULTRASOUND  Comparison:  Abdominal ultrasound - 11/24/2007  Findings:  Examination degraded secondary to patient body habitus.  Gallbladder:  Limited views appear normal.  No definite gallbladder wall thickening.  No definite echogenic stones.  No pericholecystic fluid.  Negative sonographic Murphy's sign.  Common bile duct:  Appears normal measuring 4 mm in diameter.  Liver:  Suboptimally evaluated due to patient body habitus.  IVC:  Non-visualized  Pancreas:  Non-visualized  Spleen:  Normal, measuring 12.5 cm in length  Right Kidney:  Suboptimally evaluated.  Left Kidney:  Normal cortical thickness, echogenicity and size, measuring 11.1 cm in length.  No focal renal lesions.  No echogenic renal stones.  No urinary obstruction.  Abdominal aorta:  Not visualized.  IMPRESSION: 1.  Overall degraded examination secondary to patient body habitus and poor sonographic window. 2.  No specific explanation for patient's abdominal pain. Specifically normal appearance of the gallbladder.  Original Report Authenticated By: Waynard Reeds, M.D.     No diagnosis found.    MDM  Patient here with multiple complaints including headache, back pain, abdominal pain and palpitations all which are chronic in nature.  I do not suspect intra-abdominal abnormality but given the patient;s history of endometrial cancer, we will be getting CT scan of her abdomen to rule out significant pathology.  I will be leaving the patient in the care of H. Anne Shutter PA-C to follow up on this.        [name] [place], [place] 08/23/11 [phone]

## 2011-08-23 NOTE — ED Notes (Signed)
Pt to CT

## 2011-08-23 NOTE — Discharge Instructions (Signed)
Follow up with the General surgeon listed above for further evaluation of your abdominal pain. Only use your pain medication for severe pain. Do not operate heavy machinery while on pain medication or muscle relaxer. Note that your pain medication contains acetaminophen (Tylenol) & its is not recommended that you use additional acetaminophen (Tylenol) while taking this medication.  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   Abdominal Pain  Your exam might not show the exact reason you have abdominal pain. Since there are many different causes of abdominal pain, another checkup and more tests may be needed. It is very important to follow up for lasting (persistent) or worsening symptoms. A possible cause of abdominal pain in any person who still has his or her appendix is acute appendicitis. Appendicitis is often hard to diagnose. Normal blood tests, urine tests, ultrasound, and CT scans do not completely rule out early appendicitis or other causes of abdominal pain. Sometimes, only the changes that happen over time will allow appendicitis and other causes of abdominal pain to be determined. Other potential problems that may require surgery may also take time to become more apparent. Because of this, it is important that you follow all of the instructions below.   HOME CARE INSTRUCTIONS  Do not take laxatives unless directed by your caregiver. Rest as much as possible.  Do not eat solid food until your pain is gone: A diet of water, weak decaffeinated tea, broth or bouillon, gelatin, oral rehydration solutions (ORS), frozen ice pops, or ice chips may be helpful.  When pain is gone: Start a light diet (dry toast, crackers, applesauce, or white rice). Increase the diet slowly as long as it does not bother you. Eat no dairy products (including cheese and eggs) and no spicy, fatty, fried, or high-fiber foods.  Use no alcohol, caffeine, or cigarettes.  Take your regular medicines unless  your caregiver told you not to.  Take any prescribed medicine as directed.   SEEK IMMEDIATE MEDICAL CARE IF:  The pain does not go away.  You have a fever >101 that persists You keep throwing up (vomiting) or cannot drink liquids.  The pain becomes localized (Pain in the right side could possibly be appendicitis. In an adult, pain in the left lower portion of the abdomen could be colitis or diverticulitis). You pass bloody or black tarry stools.  You have shaking chills.  There is blood in your vomit or you see blood in your bowel movements.  Your bowel movements stop (become blocked) or you cannot pass gas.  You have bloody, frequent, or painful urination.  You have yellow discoloration in the skin or whites of the eyes.  Your stomach becomes bloated or bigger.  You have dizziness or fainting.  You have chest or back pain.

## 2011-08-23 NOTE — ED Notes (Signed)
Attempted IV in left wrist.  Unable to obtain.  Notified co-worker for assist.

## 2011-08-23 NOTE — ED Notes (Signed)
Returned with additional pain meds and pt refused.  Notified PA.

## 2011-08-23 NOTE — ED Notes (Signed)
Pt alert and oriented x4. Respirations even and unlabored, bilateral symmetrical rise and fall of chest. Skin warm and dry. In no acute distress. Denies needs. Pt reports headache 7/10, but refuses the pain medication ordered. Rn told pt to let rn know if she decided that she did need the pain medication later.

## 2011-08-23 NOTE — ED Notes (Addendum)
Pt given discharge instructions.pt verbalized understanding. Pt given referral for surgeon. Pt requested copy of results. Pt given medical records number to get results from.  Pt alert and oriented x4. Respirations even and unlabored, bilateral symmetrical rise and fall of chest. Skin warm and dry. In no acute distress. Denies needs.

## 2011-08-24 NOTE — ED Provider Notes (Signed)
Medical screening examination/treatment/procedure(s) were performed by non-physician practitioner and as supervising physician I was immediately available for consultation/collaboration.    Autry Prust D Edison Wollschlager, MD 08/24/11 0500 

## 2011-08-25 LAB — URINE CULTURE
Culture  Setup Time: 201305271238
Special Requests: NORMAL

## 2011-08-26 NOTE — ED Notes (Signed)
Chart returned from EDP office. Recommend Keflex 500 mg PO TID x 7 days. Prescribed by Fayrene Helper PA-C.

## 2011-08-26 NOTE — ED Notes (Signed)
+   Urine Chart sent to EDP office for review. 

## 2011-08-27 ENCOUNTER — Emergency Department (HOSPITAL_COMMUNITY)
Admission: EM | Admit: 2011-08-27 | Discharge: 2011-08-28 | Disposition: A | Discharge status: Home / Self Care | Payer: Medicaid Other | Attending: Emergency Medicine | Admitting: Emergency Medicine

## 2011-08-27 ENCOUNTER — Encounter (HOSPITAL_COMMUNITY): Payer: Self-pay | Admitting: Emergency Medicine

## 2011-08-27 ENCOUNTER — Ambulatory Visit: Payer: Medicaid Other | Admitting: Internal Medicine

## 2011-08-27 ENCOUNTER — Emergency Department (HOSPITAL_COMMUNITY): Payer: Medicaid Other

## 2011-08-27 DIAGNOSIS — G4730 Sleep apnea, unspecified: Secondary | ICD-10-CM | POA: Insufficient documentation

## 2011-08-27 DIAGNOSIS — G8929 Other chronic pain: Secondary | ICD-10-CM

## 2011-08-27 DIAGNOSIS — F411 Generalized anxiety disorder: Secondary | ICD-10-CM | POA: Insufficient documentation

## 2011-08-27 DIAGNOSIS — F329 Major depressive disorder, single episode, unspecified: Secondary | ICD-10-CM | POA: Insufficient documentation

## 2011-08-27 DIAGNOSIS — F3289 Other specified depressive episodes: Secondary | ICD-10-CM | POA: Insufficient documentation

## 2011-08-27 DIAGNOSIS — R1013 Epigastric pain: Secondary | ICD-10-CM | POA: Insufficient documentation

## 2011-08-27 DIAGNOSIS — I10 Essential (primary) hypertension: Secondary | ICD-10-CM | POA: Insufficient documentation

## 2011-08-27 DIAGNOSIS — Z8542 Personal history of malignant neoplasm of other parts of uterus: Secondary | ICD-10-CM | POA: Insufficient documentation

## 2011-08-27 DIAGNOSIS — J45909 Unspecified asthma, uncomplicated: Secondary | ICD-10-CM | POA: Insufficient documentation

## 2011-08-27 DIAGNOSIS — M549 Dorsalgia, unspecified: Secondary | ICD-10-CM | POA: Insufficient documentation

## 2011-08-27 LAB — COMPREHENSIVE METABOLIC PANEL
ALT: 27 U/L (ref 0–35)
AST: 27 U/L (ref 0–37)
Calcium: 9.7 mg/dL (ref 8.4–10.5)
GFR calc Af Amer: >90 mL/min (ref >90)
Glucose, Bld: 96 mg/dL (ref 70–99)
Sodium: 139 mEq/L (ref 135–145)
Total Protein: 7.5 g/dL (ref 6.0–8.3)

## 2011-08-27 LAB — DIFFERENTIAL
Basophils Absolute: 0 10*3/uL (ref 0.0–0.1)
Eosinophils Absolute: 0.4 10*3/uL (ref 0.0–0.7)
Eosinophils Relative: 5 % (ref 0–5)

## 2011-08-27 LAB — URINALYSIS, ROUTINE W REFLEX MICROSCOPIC
Specific Gravity, Urine: 1.012 (ref 1.005–1.030)
Urobilinogen, UA: 0.2 mg/dL (ref 0.0–1.0)

## 2011-08-27 LAB — CBC
MCH: 30.2 pg (ref 26.0–34.0)
MCV: 87.3 fL (ref 78.0–100.0)
Platelets: 204 10*3/uL (ref 150–400)
RDW: 13.2 % (ref 11.5–15.5)

## 2011-08-27 LAB — URINE MICROSCOPIC-ADD ON

## 2011-08-27 MED ORDER — ONDANSETRON HCL 4 MG/2ML IJ SOLN
4.0000 mg | Freq: Once | INTRAMUSCULAR | Status: AC
  Administered 2011-08-27: 4 mg via INTRAVENOUS
  Filled 2011-08-27: qty 2

## 2011-08-27 MED ORDER — MORPHINE SULFATE 4 MG/ML IJ SOLN
8.0000 mg | Freq: Once | INTRAMUSCULAR | Status: AC
  Administered 2011-08-27: 8 mg via INTRAVENOUS
  Filled 2011-08-27: qty 2

## 2011-08-27 MED ORDER — LORAZEPAM 2 MG/ML IJ SOLN
1.0000 mg | Freq: Once | INTRAMUSCULAR | Status: AC
  Administered 2011-08-27: 1 mg via INTRAVENOUS
  Filled 2011-08-27: qty 1

## 2011-08-27 NOTE — Discharge Instructions (Signed)

## 2011-08-27 NOTE — ED Notes (Signed)
MWU:XL24<MW> Expected date:08/27/11<BR> Expected time: 7:21 PM<BR> Means of arrival:Ambulance<BR> Comments:<BR> EMS [facility], 56 yof pain, cancer related

## 2011-08-27 NOTE — ED Provider Notes (Signed)
History     CSN: [id number]  Arrival date & time 08/27/11  1929   First MD Initiated Contact with Patient 08/27/11 2013      Chief Complaint  Patient presents with  . Abdominal Pain    (Consider location/radiation/quality/duration/timing/severity/associated sxs/prior treatment) Patient is a 56 y.o. female presenting with abdominal pain. The history is provided by the patient.  Abdominal Pain The primary symptoms of the illness include abdominal pain.   Patient here with chronic abdominal pain that is getting worse. Does have a history of endometrial cancer. Seen here 3 days ago for similar symptoms had a negative workup including an abdominal CAT scan. Location of pain is epigastric with radiation to her back. No fever or vomiting. Was prescribed oxycodone which has not helped her symptoms. Denies any urinary symptoms. sHe is requesting hydromorphone for pain. No association with food. No diarrhea. Past Medical History  Diagnosis Date  . Sleep apnea   . Hypertension   . Thyroid disease   . Arthritis   . Asthma   . Depression   . Anxiety   . Fibromyalgia   . Lumbar spondylolysis   . Allergy   . Endometrial cancer     Past Surgical History  Procedure Date  . Cesarean section     Family History  Problem Relation Age of Onset  . Hypertension Mother   . Coronary artery disease Father   . Cancer Brother   . Hypertension Other   . Coronary artery disease Other   . Cancer Other     History  Substance Use Topics  . Smoking status: Former Smoker    Quit date: 03/30/1963  . Smokeless tobacco: Never Used  . Alcohol Use: No    OB History    Grav Para Term Preterm Abortions TAB SAB Ect Mult Living                  Review of Systems  Gastrointestinal: Positive for abdominal pain.  All other systems reviewed and are negative.    Allergies  Sulfamethoxazole w-trimethoprim  Home Medications   Current Outpatient Rx  Name Route Sig Dispense Refill  . IBUPROFEN  200 MG PO TABS Oral Take 400 mg by mouth at bedtime. Back pain.    Marland Kitchen LEVOTHYROXINE SODIUM 125 MCG PO TABS Oral Take 125 mcg by mouth daily with breakfast.     . LORAZEPAM 1 MG PO TABS Oral Take 1 mg by mouth 2 (two) times daily.      . OXYCODONE-ACETAMINOPHEN 5-325 MG PO TABS Oral Take 1-2 tablets by mouth every 6 (six) hours as needed for pain. 24 tablet 0  . SERTRALINE HCL 100 MG PO TABS Oral Take 200 mg by mouth at bedtime.     . THIOTHIXENE 2 MG PO CAPS Oral Take 2 mg by mouth at bedtime.       BP 143/82  Pulse 95  Temp(Src) 98.5 F (36.9 C) (Oral)  Resp 18  SpO2 97%  Physical Exam  Nursing note and vitals reviewed. Constitutional: She is oriented to person, place, and time. She appears well-developed and well-nourished.  Non-toxic appearance. No distress.  HENT:  Head: Normocephalic and atraumatic.  Eyes: Conjunctivae, EOM and lids are normal. Pupils are equal, round, and reactive to light.  Neck: Normal range of motion. Neck supple. No tracheal deviation present. No mass present.  Cardiovascular: Normal rate, regular rhythm and normal heart sounds.  Exam reveals no gallop.   No murmur heard. Pulmonary/Chest: Effort normal  and breath sounds normal. No stridor. No respiratory distress. She has no decreased breath sounds. She has no wheezes. She has no rhonchi. She has no rales.  Abdominal: Soft. Normal appearance and bowel sounds are normal. She exhibits no distension. There is tenderness in the epigastric area. There is no rigidity, no rebound, no guarding and no CVA tenderness.  Musculoskeletal: Normal range of motion. She exhibits no edema and no tenderness.  Neurological: She is alert and oriented to person, place, and time. She has normal strength. No cranial nerve deficit or sensory deficit. GCS eye subscore is 4. GCS verbal subscore is 5. GCS motor subscore is 6.  Skin: Skin is warm and dry. No abrasion and no rash noted.  Psychiatric: She has a normal mood and affect. Her  speech is normal and behavior is normal.    ED Course  Procedures (including critical care time)   Labs Reviewed  CBC  DIFFERENTIAL  COMPREHENSIVE METABOLIC PANEL  LIPASE, BLOOD  URINALYSIS, ROUTINE W REFLEX MICROSCOPIC  URINE CULTURE   No results found.   No diagnosis found.    MDM  Records reviewed patient does have a history of chronic abdominal pain. She is scheduled to see her physician on Monday. Labs and x-rays are normal today. He was given morphine and feels much better. She is agreeable to discharge        [name], MD 08/27/11 [phone]

## 2011-08-27 NOTE — ED Notes (Signed)
MD at bedside. Dr. Allen at bedside.  

## 2011-08-27 NOTE — ED Notes (Signed)
Per EMS: Pt presented to the ER with c/o abd pain, pt states that she has chronic issues and that abd pain is reoccurring, pt at this time states that she cannot elaborate on her med Hx nor give Rx meds since she is in the severe pain.

## 2011-08-27 NOTE — ED Notes (Signed)
Pt ambulated to the restroom, asked to collect urine sample.

## 2011-08-28 LAB — URINE CULTURE

## 2011-08-28 NOTE — ED Notes (Signed)
Chart returned from EDP office. Prescribed Keflex 500 mg PO TID x 7 days. Prescribed by Fayrene Helper PA-C. Called patient and notified them of new RX. RX called to [facility] on [address] [phone]) by Norm Parcel PFM.

## 2011-09-08 ENCOUNTER — Telehealth: Payer: Self-pay | Admitting: *Deleted

## 2011-09-08 NOTE — Telephone Encounter (Signed)
Tc from pt to state that she did not see the cardiologist because she 'did not like him'.  Advised that we cannot proceed with recommended treatment without cardiac clearance.  Pt advised to call Dr Tamela Oddi for an appointment asap.. Call placed to Dr Marcia Brash office alerting [name] that pt would be calling and to please assist with appointment. Same noted to Dr Tamela Oddi

## 2011-09-09 ENCOUNTER — Encounter (HOSPITAL_COMMUNITY): Payer: Self-pay

## 2011-09-09 ENCOUNTER — Emergency Department (HOSPITAL_COMMUNITY)
Admission: EM | Admit: 2011-09-09 | Discharge: 2011-09-10 | Disposition: A | Discharge status: Home / Self Care | Payer: Medicaid Other | Attending: Emergency Medicine | Admitting: Emergency Medicine

## 2011-09-09 DIAGNOSIS — F419 Anxiety disorder, unspecified: Secondary | ICD-10-CM

## 2011-09-09 DIAGNOSIS — F19939 Other psychoactive substance use, unspecified with withdrawal, unspecified: Secondary | ICD-10-CM | POA: Insufficient documentation

## 2011-09-09 DIAGNOSIS — F3289 Other specified depressive episodes: Secondary | ICD-10-CM | POA: Insufficient documentation

## 2011-09-09 DIAGNOSIS — F329 Major depressive disorder, single episode, unspecified: Secondary | ICD-10-CM | POA: Insufficient documentation

## 2011-09-09 DIAGNOSIS — F1320 Sedative, hypnotic or anxiolytic dependence, uncomplicated: Secondary | ICD-10-CM | POA: Insufficient documentation

## 2011-09-09 DIAGNOSIS — M47817 Spondylosis without myelopathy or radiculopathy, lumbosacral region: Secondary | ICD-10-CM | POA: Insufficient documentation

## 2011-09-09 DIAGNOSIS — I10 Essential (primary) hypertension: Secondary | ICD-10-CM | POA: Insufficient documentation

## 2011-09-09 DIAGNOSIS — F411 Generalized anxiety disorder: Secondary | ICD-10-CM | POA: Insufficient documentation

## 2011-09-09 DIAGNOSIS — C549 Malignant neoplasm of corpus uteri, unspecified: Secondary | ICD-10-CM | POA: Insufficient documentation

## 2011-09-09 DIAGNOSIS — G4730 Sleep apnea, unspecified: Secondary | ICD-10-CM | POA: Insufficient documentation

## 2011-09-09 NOTE — ED Notes (Signed)
Pt states PMD cut ativan to 1 a day. Pt states she took her normal 2 a day and she needs more ativan. Pt states she took her last ativan today and will withdraw if she does not have her ativan. Pt is anxious and speaking rapidly.

## 2011-09-09 NOTE — ED Notes (Addendum)
Pt states that she was being prescribed 1 of Ativan daily and was previously prescribed 2. She kept taking 2 of Ativan and now has run out. When she got out of the [facility] she was [facility] to [facility] prescription, but she hasn't been able to go since she was sick. Her primary care doctor states that he won't refill her Ativan since it wasn't time for a prescription yet and now she's going through a withdrawal. She says she might have a heart problem, but physicians haven't figured it out yet. She is feeling palpitations.

## 2011-09-10 MED ORDER — LORAZEPAM 1 MG PO TABS
1.0000 mg | ORAL_TABLET | Freq: Once | ORAL | Status: AC
  Administered 2011-09-10: 1 mg via ORAL
  Filled 2011-09-10: qty 1

## 2011-09-10 MED ORDER — LORAZEPAM 1 MG PO TABS: 1.0000 mg | ORAL_TABLET | Freq: Two times a day (BID) | ORAL | Status: DC

## 2011-09-10 NOTE — Discharge Instructions (Signed)

## 2011-09-10 NOTE — ED Provider Notes (Signed)
History     CSN: [id number]  Arrival date & time 09/09/11  2003   First MD Initiated Contact with Patient 09/10/11 0022      Chief Complaint  Patient presents with  . Withdrawal    (Consider location/radiation/quality/duration/timing/severity/associated sxs/prior treatment) HPI Comments: Patient here after her PCP, Dr. Concepcion Elk cut her ativan from 1mg  BID to 1mg  daily and she has now run out - she believes that she is going through withdrawal of this which she states is headache and chest tightness and palpitations.  She states that she has a history of endometrial cancer for which they cannot do surgery until she is seen by a cardiologist because of the palpitations (she points to the monitor which shows a PVC).  She states that she feels anxious and would like a referral to a psychiatrist - she denies suicidal or homicidal ideation.  Patient is a 56 y.o. female presenting with anxiety. The history is provided by the patient. No language interpreter was used.  Anxiety This is a new problem. The current episode started in the past 7 days. The problem occurs constantly. The problem has been unchanged. Associated symptoms include chest pain and headaches. Pertinent negatives include no abdominal pain, anorexia, arthralgias, change in bowel habit, chills, congestion, coughing, diaphoresis, fatigue, fever, joint swelling, myalgias, nausea, neck pain, numbness, rash, sore throat, swollen glands, urinary symptoms, vertigo, visual change, vomiting or weakness. Nothing aggravates the symptoms. She has tried nothing for the symptoms. The treatment provided no relief.    Past Medical History  Diagnosis Date  . Sleep apnea   . Hypertension   . Thyroid disease   . Arthritis   . Asthma   . Depression   . Anxiety   . Fibromyalgia   . Lumbar spondylolysis   . Allergy   . Endometrial cancer     Past Surgical History  Procedure Date  . Cesarean section     Family History  Problem Relation  Age of Onset  . Hypertension Mother   . Coronary artery disease Father   . Cancer Brother   . Hypertension Other   . Coronary artery disease Other   . Cancer Other     History  Substance Use Topics  . Smoking status: Never Smoker   . Smokeless tobacco: Never Used  . Alcohol Use: No    OB History    Grav Para Term Preterm Abortions TAB SAB Ect Mult Living   1 1              Review of Systems  Constitutional: Negative for fever, chills, diaphoresis and fatigue.  HENT: Negative for congestion, sore throat and neck pain.   Respiratory: Negative for cough.   Cardiovascular: Positive for chest pain.  Gastrointestinal: Negative for nausea, vomiting, abdominal pain, anorexia and change in bowel habit.  Musculoskeletal: Negative for myalgias, joint swelling and arthralgias.  Skin: Negative for rash.  Neurological: Positive for headaches. Negative for vertigo, weakness and numbness.  All other systems reviewed and are negative.    Allergies  Sulfamethoxazole w-trimethoprim  Home Medications   Current Outpatient Rx  Name Route Sig Dispense Refill  . IBUPROFEN 200 MG PO TABS Oral Take 400 mg by mouth at bedtime. Back pain.    Marland Kitchen LEVOTHYROXINE SODIUM 125 MCG PO TABS Oral Take 125 mcg by mouth daily with breakfast.     . LORAZEPAM 1 MG PO TABS Oral Take 1 mg by mouth 2 (two) times daily.      Marland Kitchen  SERTRALINE HCL 100 MG PO TABS Oral Take 200 mg by mouth at bedtime.     . THIOTHIXENE 2 MG PO CAPS Oral Take 2 mg by mouth at bedtime.       BP 140/82  Pulse 89  Temp 98.8 F (37.1 C) (Oral)  Resp 18  Ht 4\' 11"  (1.499 m)  Wt 190 lb (86.183 kg)  BMI 38.38 kg/m2  SpO2 98%  Physical Exam  Nursing note and vitals reviewed. Constitutional: She is oriented to person, place, and time. She appears well-developed and well-nourished. No distress.  HENT:  Head: Normocephalic and atraumatic.  Right Ear: External ear normal.  Left Ear: External ear normal.  Nose: Nose normal.    Mouth/Throat: Oropharynx is clear and moist. No oropharyngeal exudate.  Eyes: Conjunctivae are normal. Pupils are equal, round, and reactive to light. No scleral icterus.  Neck: Normal range of motion. Neck supple.  Cardiovascular: Normal rate, regular rhythm and normal heart sounds.  Exam reveals no gallop and no friction rub.   No murmur heard.      Occasional PVC noted  Pulmonary/Chest: Effort normal and breath sounds normal. No respiratory distress. She has no wheezes. She has no rales. She exhibits no tenderness.  Abdominal: Soft. Bowel sounds are normal. She exhibits no distension. There is no tenderness.  Musculoskeletal: Normal range of motion. She exhibits no edema and no tenderness.  Lymphadenopathy:    She has no cervical adenopathy.  Neurological: She is alert and oriented to person, place, and time. No cranial nerve deficit.  Skin: Skin is warm and dry. No rash noted. No erythema. No pallor.  Psychiatric: Thought content normal. Her mood appears anxious. Her speech is rapid and/or pressured. She is agitated. Cognition and memory are normal. She expresses impulsivity. She expresses no homicidal and no suicidal ideation. She expresses no suicidal plans and no homicidal plans.    ED Course  Procedures (including critical care time)  Labs Reviewed - No data to display No results found.   Anxiety   MDM  Patient with a long history of anxiety on ativan for this - recently was weaned down to 1 daily which she is unable to tolerate, will give resources for psychiatry referral and refill rx.        Izola Price [place], [place] 09/10/11 [phone]

## 2011-09-10 NOTE — ED Provider Notes (Signed)
Medical screening examination/treatment/procedure(s) were performed by non-physician practitioner and as supervising physician I was immediately available for consultation/collaboration.   Hanley Seamen, MD 09/10/11 [phone]

## 2011-09-20 ENCOUNTER — Encounter: Payer: Self-pay | Admitting: Cardiovascular Disease

## 2011-09-20 ENCOUNTER — Ambulatory Visit (INDEPENDENT_AMBULATORY_CARE_PROVIDER_SITE_OTHER): Payer: Medicaid Other | Admitting: Cardiovascular Disease

## 2011-09-20 VITALS — BP 128/82 | HR 85 | Ht 59.0 in | Wt 201.0 lb

## 2011-09-20 DIAGNOSIS — Z01810 Encounter for preprocedural cardiovascular examination: Secondary | ICD-10-CM

## 2011-09-20 DIAGNOSIS — R002 Palpitations: Secondary | ICD-10-CM

## 2011-09-20 NOTE — Assessment & Plan Note (Signed)
She has no chest pain c/w angina or any symptoms of CHF. I do not think ischemic testing is indicated before her planned surgical procedure. I will review her echo and monitor in several weeks then formulate a final assessment of her cardiac risk for surgery.

## 2011-09-20 NOTE — Assessment & Plan Note (Signed)
She describes daily palpitations. This is most likely premature beats. No prolonged, sustained episodes. I will arrange a 48 hour Holter monitor to confirm this as she has daily episodes. Will arrange echo to assess LV function and exclude any structural disease.

## 2011-09-20 NOTE — Patient Instructions (Addendum)
Your physician recommends that you schedule a follow-up appointment in: 3 weeks.  Your physician has recommended that you wear a holter monitor. Holter monitors are medical devices that record the heart's electrical activity. Doctors most often use these monitors to diagnose arrhythmias. Arrhythmias are problems with the speed or rhythm of the heartbeat. The monitor is a small, portable device. You can wear one while you do your normal daily activities. This is usually used to diagnose what is causing palpitations/syncope (passing out).   Your physician has requested that you have an echocardiogram. Echocardiography is a painless test that uses sound waves to create images of your heart. It provides your doctor with information about the size and shape of your heart and how well your heart's chambers and valves are working. This procedure takes approximately one hour. There are no restrictions for this procedure.

## 2011-09-20 NOTE — Progress Notes (Signed)
History of Present Illness: 56 yo WF with history of hypothyroidism, OSA, spinal stenosis, fibromyalgia, HTN, anxiety, depression, asthma and recent diagnosis of endometrial cancer who is here today for evaluation of palpitations and cardiac risk assessment prior to planned hysterectomy. She tells me that she has been going through menopause. She has been experiencing hot flashes. She feels her heart racing at times. This has been occurring for months. This lasts for a few minutes. She had been seeing Dr. Rennis Golden with [facility]. She had plans to wear a monitor and have a stress test but she did not complete this because she got into an argument with Dr. Rennis Golden and he dismissed her from his practice. She feels her heart race every day. This lasts for a few minutes. No near syncope or syncope. She also notes occasional right sided chest discomfort but this is not associated with exertion. She has no known cardiac disease. She is not a smoker. No exertional dyspnea or chest pain.   Primary Care Physician: Fleet Contras  Past Medical History  Diagnosis Date  . Sleep apnea   . Hypertension   . Thyroid disease   . Arthritis   . Asthma   . Depression   . Anxiety   . Fibromyalgia   . Lumbar spondylolysis   . Allergy   . Endometrial cancer   . Spinal stenosis     Past Surgical History  Procedure Date  . Cesarean section     Current Outpatient Prescriptions  Medication Sig Dispense Refill  . ibuprofen (ADVIL,MOTRIN) 200 MG tablet Take 400 mg by mouth at bedtime. Back pain.      Marland Kitchen levothyroxine (SYNTHROID, LEVOTHROID) 125 MCG tablet Take 125 mcg by mouth daily with breakfast.       . LORazepam (ATIVAN) 1 MG tablet Take 1 mg by mouth 2 (two) times daily.        . sertraline (ZOLOFT) 100 MG tablet Take 200 mg by mouth at bedtime.       Marland Kitchen thiothixene (NAVANE) 2 MG capsule Take 2 mg by mouth at bedtime.         Allergies  Allergen Reactions  . Sulfamethoxazole W-Trimethoprim Other  (See Comments)    Unknown reaction    History   Social History  . Marital Status: Single    Spouse Name: N/A    Number of Children: N/A  . Years of Education: N/A   Occupational History  . Not on file.   Social History Main Topics  . Smoking status: Never Smoker   . Smokeless tobacco: Never Used  . Alcohol Use: No  . Drug Use: No  . Sexually Active: Not on file   Other Topics Concern  . Not on file   Social History Narrative  . No narrative on file    Family History  Problem Relation Age of Onset  . Hypertension Mother   . Coronary artery disease Father   . Cancer Brother     Renal cell carcinoma  . Hypertension Other   . Cancer Other   . Heart attack Father   . Coronary artery disease Brother     Review of Systems:  As stated in the HPI and otherwise negative.   BP 128/82  Pulse 85  Ht 4\' 11"  (1.499 m)  Wt 201 lb (91.173 kg)  BMI 40.60 kg/m2  Physical Examination: General: Well developed, well nourished, NAD.  HEENT: OP clear, mucus membranes moist SKIN: warm, dry. No rashes. Neuro: No focal  deficits Musculoskeletal: Muscle strength 5/5 all ext Psychiatric: Mood and affect normal Neck: No JVD, no carotid bruits, no thyromegaly, no lymphadenopathy. Lungs:Clear bilaterally, no wheezes, rhonci, crackles Cardiovascular: Regular rate and rhythm. No murmurs, gallops or rubs. Abdomen:Soft. Bowel sounds present. Non-tender.  Extremities: No lower extremity edema. Pulses are 2 + in the bilateral DP/PT.  EKG: Sinus rhythm.

## 2011-09-22 ENCOUNTER — Telehealth: Payer: Self-pay | Admitting: Gynecologic Oncology

## 2011-09-22 NOTE — Telephone Encounter (Signed)
Called to speak with the patient about the status of cardiac testing including monitor testing and an echo recommended by her cardiologist.  Patient's mother stating that the patient "is laying down right now."  Reporting that the patient is "supposed to get the tests.  She has an appointment tomorrow but not sure for what.  I think it may be the echo."  Asked the mother to please have the patient to call back to the GYN Oncology clinic with an update. No concerns or questions voiced.

## 2011-09-22 NOTE — Telephone Encounter (Signed)
Pt reporting that she has a scheduled echocardiogram on 10/01/11.  At that time, she is planning on picking up her holter monitor.  Pt stating that she has a follow up appointment with her cardiologist on 10/08/11.  Instructed on the importance of keeping that appointment.  Pt verbalizes understanding.  No concerns or questions voiced.  Pt to call back after her visit on with an update on 10/11/11.

## 2011-09-23 ENCOUNTER — Encounter (INDEPENDENT_AMBULATORY_CARE_PROVIDER_SITE_OTHER): Payer: Self-pay | Admitting: Surgery

## 2011-09-28 ENCOUNTER — Encounter (INDEPENDENT_AMBULATORY_CARE_PROVIDER_SITE_OTHER): Payer: Medicaid Other

## 2011-09-28 DIAGNOSIS — R002 Palpitations: Secondary | ICD-10-CM

## 2011-10-01 ENCOUNTER — Ambulatory Visit (HOSPITAL_COMMUNITY): Payer: Medicaid Other | Attending: Internal Medicine

## 2011-10-01 DIAGNOSIS — C549 Malignant neoplasm of corpus uteri, unspecified: Secondary | ICD-10-CM | POA: Insufficient documentation

## 2011-10-01 DIAGNOSIS — M4800 Spinal stenosis, site unspecified: Secondary | ICD-10-CM | POA: Insufficient documentation

## 2011-10-01 DIAGNOSIS — R002 Palpitations: Secondary | ICD-10-CM | POA: Insufficient documentation

## 2011-10-01 DIAGNOSIS — G4730 Sleep apnea, unspecified: Secondary | ICD-10-CM | POA: Insufficient documentation

## 2011-10-01 DIAGNOSIS — I10 Essential (primary) hypertension: Secondary | ICD-10-CM | POA: Insufficient documentation

## 2011-10-01 DIAGNOSIS — Z01810 Encounter for preprocedural cardiovascular examination: Secondary | ICD-10-CM | POA: Insufficient documentation

## 2011-10-01 NOTE — Progress Notes (Signed)
Echocardiogram performed.  

## 2011-10-08 ENCOUNTER — Ambulatory Visit: Payer: Medicaid Other | Admitting: Cardiovascular Disease

## 2011-10-08 ENCOUNTER — Telehealth: Payer: Self-pay | Admitting: Cardiovascular Disease

## 2011-10-08 NOTE — Telephone Encounter (Signed)
Please return call to patient at  [phone] regarding test results.   Patient was scheduled for appnt today at 4pm. Patient is sick not feeling well at all.

## 2011-10-08 NOTE — Telephone Encounter (Signed)
Spoke with pt and reviewed echo and monitor results.  She states she is not feeling well today with general body aches.  She was recently given antibiotics for a sinus infection and possible tooth infection.  She is not able to be here for appt today with Dr. Clifton James. I offered to reschedule but she stated she would call back to reschedule.

## 2011-10-08 NOTE — Telephone Encounter (Signed)
F/u  ° °Patient calling for f/u status  °

## 2011-10-09 ENCOUNTER — Encounter (HOSPITAL_COMMUNITY): Payer: Self-pay | Admitting: *Deleted

## 2011-10-09 ENCOUNTER — Inpatient Hospital Stay (HOSPITAL_COMMUNITY)
Admission: EM | Admit: 2011-10-09 | Discharge: 2011-10-15 | DRG: 076 | Disposition: A | Discharge status: Home / Self Care | Payer: Medicaid Other | Attending: Internal Medicine | Admitting: Internal Medicine

## 2011-10-09 DIAGNOSIS — F411 Generalized anxiety disorder: Secondary | ICD-10-CM | POA: Diagnosis present

## 2011-10-09 DIAGNOSIS — A872 Lymphocytic choriomeningitis: Principal | ICD-10-CM | POA: Diagnosis present

## 2011-10-09 DIAGNOSIS — IMO0001 Reserved for inherently not codable concepts without codable children: Secondary | ICD-10-CM | POA: Diagnosis present

## 2011-10-09 DIAGNOSIS — E039 Hypothyroidism, unspecified: Secondary | ICD-10-CM

## 2011-10-09 DIAGNOSIS — C541 Malignant neoplasm of endometrium: Secondary | ICD-10-CM

## 2011-10-09 DIAGNOSIS — F3289 Other specified depressive episodes: Secondary | ICD-10-CM | POA: Diagnosis present

## 2011-10-09 DIAGNOSIS — M4800 Spinal stenosis, site unspecified: Secondary | ICD-10-CM

## 2011-10-09 DIAGNOSIS — F329 Major depressive disorder, single episode, unspecified: Secondary | ICD-10-CM

## 2011-10-09 DIAGNOSIS — M4310 Spondylolisthesis, site unspecified: Secondary | ICD-10-CM | POA: Diagnosis present

## 2011-10-09 DIAGNOSIS — R519 Headache, unspecified: Secondary | ICD-10-CM | POA: Diagnosis present

## 2011-10-09 DIAGNOSIS — R51 Headache: Secondary | ICD-10-CM | POA: Diagnosis present

## 2011-10-09 DIAGNOSIS — N390 Urinary tract infection, site not specified: Secondary | ICD-10-CM

## 2011-10-09 DIAGNOSIS — G039 Meningitis, unspecified: Secondary | ICD-10-CM

## 2011-10-09 DIAGNOSIS — Z79899 Other long term (current) drug therapy: Secondary | ICD-10-CM

## 2011-10-09 DIAGNOSIS — I10 Essential (primary) hypertension: Secondary | ICD-10-CM | POA: Diagnosis present

## 2011-10-09 DIAGNOSIS — R839 Unspecified abnormal finding in cerebrospinal fluid: Secondary | ICD-10-CM | POA: Diagnosis present

## 2011-10-09 DIAGNOSIS — G4730 Sleep apnea, unspecified: Secondary | ICD-10-CM | POA: Diagnosis present

## 2011-10-09 DIAGNOSIS — J45909 Unspecified asthma, uncomplicated: Secondary | ICD-10-CM | POA: Diagnosis present

## 2011-10-09 DIAGNOSIS — Z8542 Personal history of malignant neoplasm of other parts of uterus: Secondary | ICD-10-CM

## 2011-10-09 LAB — COMPREHENSIVE METABOLIC PANEL
ALT: 26 U/L (ref 0–35)
AST: 27 U/L (ref 0–37)
Albumin: 4.3 g/dL (ref 3.5–5.2)
Alkaline Phosphatase: 60 U/L (ref 39–117)
Calcium: 9.9 mg/dL (ref 8.4–10.5)
Potassium: 4.1 mEq/L (ref 3.5–5.1)
Sodium: 136 mEq/L (ref 135–145)
Total Protein: 8.2 g/dL (ref 6.0–8.3)

## 2011-10-09 LAB — CBC
MCH: 31 pg (ref 26.0–34.0)
MCHC: 35.9 g/dL (ref 30.0–36.0)
Platelets: 225 10*3/uL (ref 150–400)

## 2011-10-09 MED ORDER — ACETAMINOPHEN 500 MG PO TABS
1000.0000 mg | ORAL_TABLET | Freq: Once | ORAL | Status: AC
  Administered 2011-10-09: 1000 mg via ORAL
  Filled 2011-10-09: qty 2

## 2011-10-09 NOTE — ED Notes (Signed)
Pt states she had a fever of 104.0 Thursday night and has a headache, and "feels terrible"  , out of medication Ativan 1 mg and other medication and says she has terrible withdrawls when she runs out of her medication

## 2011-10-10 ENCOUNTER — Emergency Department (HOSPITAL_COMMUNITY): Payer: Medicaid Other

## 2011-10-10 ENCOUNTER — Encounter (HOSPITAL_COMMUNITY): Payer: Self-pay | Admitting: Family Medicine

## 2011-10-10 ENCOUNTER — Other Ambulatory Visit: Payer: Self-pay | Admitting: Radiology

## 2011-10-10 ENCOUNTER — Inpatient Hospital Stay (HOSPITAL_COMMUNITY): Payer: Medicaid Other

## 2011-10-10 DIAGNOSIS — C549 Malignant neoplasm of corpus uteri, unspecified: Secondary | ICD-10-CM

## 2011-10-10 DIAGNOSIS — J45909 Unspecified asthma, uncomplicated: Secondary | ICD-10-CM

## 2011-10-10 DIAGNOSIS — R519 Headache, unspecified: Secondary | ICD-10-CM | POA: Diagnosis present

## 2011-10-10 DIAGNOSIS — R51 Headache: Secondary | ICD-10-CM | POA: Diagnosis present

## 2011-10-10 LAB — URINE MICROSCOPIC-ADD ON

## 2011-10-10 LAB — CSF CELL COUNT WITH DIFFERENTIAL
Eosinophils, CSF: 0 % (ref 0–1)
Lymphs, CSF: 99 % — ABNORMAL HIGH (ref 40–80)
Monocyte-Macrophage-Spinal Fluid: 1 % — ABNORMAL LOW (ref 15–45)
Segmented Neutrophils-CSF: 0 % (ref 0–6)
Tube #: 1
Tube #: 4

## 2011-10-10 LAB — CBC
MCH: 31 pg (ref 26.0–34.0)
MCHC: 35.4 g/dL (ref 30.0–36.0)
Platelets: 178 10*3/uL (ref 150–400)
RBC: 4.68 MIL/uL (ref 3.87–5.11)

## 2011-10-10 LAB — URINALYSIS, ROUTINE W REFLEX MICROSCOPIC
Bilirubin Urine: NEGATIVE
Glucose, UA: NEGATIVE mg/dL
Nitrite: NEGATIVE
Specific Gravity, Urine: 1.023 (ref 1.005–1.030)
pH: 5.5 (ref 5.0–8.0)

## 2011-10-10 LAB — COMPREHENSIVE METABOLIC PANEL
ALT: 21 U/L (ref 0–35)
AST: 23 U/L (ref 0–37)
CO2: 25 mEq/L (ref 19–32)
Calcium: 9 mg/dL (ref 8.4–10.5)
Sodium: 135 mEq/L (ref 135–145)
Total Protein: 7.5 g/dL (ref 6.0–8.3)

## 2011-10-10 LAB — DIFFERENTIAL
Lymphs Abs: 2 10*3/uL (ref 0.7–4.0)
Monocytes Relative: 2 % — ABNORMAL LOW (ref 3–12)
Neutro Abs: 5.9 10*3/uL (ref 1.7–7.7)
Neutrophils Relative %: 73 % (ref 43–77)

## 2011-10-10 LAB — GLUCOSE, CAPILLARY: Glucose-Capillary: 136 mg/dL — ABNORMAL HIGH (ref 70–99)

## 2011-10-10 LAB — PROTIME-INR
INR: 1 (ref 0.00–1.49)
Prothrombin Time: 13.4 seconds (ref 11.6–15.2)

## 2011-10-10 LAB — GRAM STAIN

## 2011-10-10 LAB — PROTEIN AND GLUCOSE, CSF: Total  Protein, CSF: 122 mg/dL — ABNORMAL HIGH (ref 15–45)

## 2011-10-10 MED ORDER — VANCOMYCIN HCL IN DEXTROSE 1-5 GM/200ML-% IV SOLN
1000.0000 mg | INTRAVENOUS | Status: AC
  Administered 2011-10-10: 1000 mg via INTRAVENOUS
  Filled 2011-10-10: qty 200

## 2011-10-10 MED ORDER — THIOTHIXENE 2 MG PO CAPS
2.0000 mg | ORAL_CAPSULE | Freq: Every day | ORAL | Status: DC
  Administered 2011-10-10 – 2011-10-14 (×5): 2 mg via ORAL
  Filled 2011-10-10 (×6): qty 1

## 2011-10-10 MED ORDER — VANCOMYCIN HCL 1000 MG IV SOLR
1250.0000 mg | Freq: Two times a day (BID) | INTRAVENOUS | Status: DC
  Administered 2011-10-10 – 2011-10-12 (×4): 1250 mg via INTRAVENOUS
  Filled 2011-10-10 (×4): qty 1250

## 2011-10-10 MED ORDER — LORAZEPAM 1 MG PO TABS
1.0000 mg | ORAL_TABLET | Freq: Once | ORAL | Status: AC
  Administered 2011-10-10: 1 mg via ORAL
  Filled 2011-10-10: qty 1

## 2011-10-10 MED ORDER — ENOXAPARIN SODIUM 30 MG/0.3ML ~~LOC~~ SOLN
30.0000 mg | SUBCUTANEOUS | Status: DC
  Administered 2011-10-10 – 2011-10-11 (×2): 30 mg via SUBCUTANEOUS
  Filled 2011-10-10 (×3): qty 0.3

## 2011-10-10 MED ORDER — DIPHENHYDRAMINE HCL 50 MG/ML IJ SOLN
25.0000 mg | Freq: Four times a day (QID) | INTRAMUSCULAR | Status: DC | PRN
  Administered 2011-10-11: 25 mg via INTRAVENOUS
  Filled 2011-10-10: qty 1

## 2011-10-10 MED ORDER — PANTOPRAZOLE SODIUM 40 MG PO TBEC
40.0000 mg | DELAYED_RELEASE_TABLET | Freq: Every day | ORAL | Status: DC
  Administered 2011-10-10 – 2011-10-15 (×6): 40 mg via ORAL
  Filled 2011-10-10 (×7): qty 1

## 2011-10-10 MED ORDER — HYDROMORPHONE HCL PF 1 MG/ML IJ SOLN
1.0000 mg | INTRAMUSCULAR | Status: DC | PRN
Start: 2011-10-10 — End: 2011-10-15
  Administered 2011-10-10 – 2011-10-15 (×13): 1 mg via INTRAVENOUS
  Filled 2011-10-10 (×13): qty 1

## 2011-10-10 MED ORDER — MORPHINE SULFATE 4 MG/ML IJ SOLN
4.0000 mg | Freq: Once | INTRAMUSCULAR | Status: AC
  Administered 2011-10-10: 4 mg via INTRAVENOUS
  Filled 2011-10-10: qty 1

## 2011-10-10 MED ORDER — CEFTRIAXONE SODIUM 2 G IJ SOLR
2.0000 g | Freq: Two times a day (BID) | INTRAMUSCULAR | Status: DC
  Administered 2011-10-10 – 2011-10-14 (×8): 2 g via INTRAVENOUS
  Filled 2011-10-10 (×8): qty 2

## 2011-10-10 MED ORDER — SERTRALINE HCL 100 MG PO TABS
200.0000 mg | ORAL_TABLET | Freq: Every day | ORAL | Status: DC
  Administered 2011-10-10 – 2011-10-14 (×5): 200 mg via ORAL
  Filled 2011-10-10 (×6): qty 2

## 2011-10-10 MED ORDER — HYDROMORPHONE HCL PF 1 MG/ML IJ SOLN
1.0000 mg | Freq: Once | INTRAMUSCULAR | Status: AC
  Administered 2011-10-10: 1 mg via INTRAVENOUS
  Filled 2011-10-10: qty 1

## 2011-10-10 MED ORDER — DEXAMETHASONE SODIUM PHOSPHATE 10 MG/ML IJ SOLN
10.0000 mg | Freq: Once | INTRAMUSCULAR | Status: AC
  Administered 2011-10-10: 10 mg via INTRAVENOUS
  Filled 2011-10-10: qty 1

## 2011-10-10 MED ORDER — LEVOTHYROXINE SODIUM 125 MCG PO TABS
125.0000 ug | ORAL_TABLET | Freq: Every day | ORAL | Status: DC
  Administered 2011-10-11 – 2011-10-15 (×5): 125 ug via ORAL
  Filled 2011-10-10 (×7): qty 1

## 2011-10-10 MED ORDER — DEXTROSE 5 % IV SOLN
2.0000 g | INTRAVENOUS | Status: AC
  Administered 2011-10-10: 2 g via INTRAVENOUS
  Filled 2011-10-10: qty 2

## 2011-10-10 MED ORDER — LORAZEPAM 1 MG PO TABS
1.0000 mg | ORAL_TABLET | Freq: Two times a day (BID) | ORAL | Status: DC
  Administered 2011-10-10 – 2011-10-15 (×11): 1 mg via ORAL
  Filled 2011-10-10 (×11): qty 1

## 2011-10-10 MED ORDER — SODIUM CHLORIDE 0.9 % IV BOLUS (SEPSIS)
1000.0000 mL | Freq: Once | INTRAVENOUS | Status: AC
  Administered 2011-10-10: 1000 mL via INTRAVENOUS

## 2011-10-10 MED ORDER — DEXTROSE 5 % IV SOLN
1.0000 g | Freq: Once | INTRAVENOUS | Status: DC
  Filled 2011-10-10: qty 10

## 2011-10-10 MED ORDER — SODIUM CHLORIDE 0.9 % IV SOLN
INTRAVENOUS | Status: DC
  Administered 2011-10-10 – 2011-10-14 (×5): via INTRAVENOUS

## 2011-10-10 MED ORDER — GADOBENATE DIMEGLUMINE 529 MG/ML IV SOLN
20.0000 mL | Freq: Once | INTRAVENOUS | Status: AC | PRN
  Administered 2011-10-10: 20 mL via INTRAVENOUS

## 2011-10-10 NOTE — H&P (Signed)
[facility] History and Physical  Zeeva Courser Zia OZH:[id number] DOB: 01-06-1956 DOA: 10/09/2011  Referring physician: ER physician PCP: Dorrene German, MD   Chief Complaint: headache  HPI:  56 year old female with history of anxiety, Hypothyroidism, palpitations who presented to ED with complaints of  frontal headache started 4 days prior to this admission, throbbing and intermittent in nature without associated auras. Patient reports if she sneezes or coughs she get extreme pressure in her head and feels as if "hee head will explode". No fever or chills. She does report having burning sensation on urination but no frequency or urgency or blood in urine. No associated abdominal pain, no nausea or vomiting.  Review of Systems:   Constitutional: Negative for fever, chills and malaise/fatigue. Negative for diaphoresis.  HENT: Negative for hearing loss, ear pain, nosebleeds, congestion, sore throat, neck pain, tinnitus and ear discharge.   Eyes: Negative for blurred vision, double vision, photophobia, pain, discharge and redness.  Respiratory: Negative for cough, hemoptysis, sputum production, shortness of breath, wheezing and stridor.   Cardiovascular: Negative for chest pain, palpitations, orthopnea, claudication and leg swelling.  Gastrointestinal: Negative for nausea, vomiting and abdominal pain. Negative for heartburn, constipation, blood in stool and melena.  Genitourinary: positive for dysuria, but negative for urgency, frequency, hematuria and flank pain.  Musculoskeletal: Negative for myalgias, back pain, joint pain and falls.  Skin: Negative for itching and rash.  Neurological: Negative for dizziness and weakness. Negative for tingling, tremors, sensory change, speech change, focal weakness, loss of consciousness  Endo/Heme/Allergies: Negative for environmental allergies and polydipsia. Does not bruise/bleed easily.  Psychiatric/Behavioral: Negative for suicidal ideas. The  patient is not nervous/anxious.      Past Medical History  Diagnosis Date  . Sleep apnea   . Hypertension   . Thyroid disease   . Arthritis   . Asthma   . Depression   . Anxiety   . Fibromyalgia   . Lumbar spondylolysis   . Allergy   . Endometrial cancer   . Spinal stenosis    Past Surgical History  Procedure Date  . Cesarean section    Social History:  reports that she has never smoked. She has never used smokeless tobacco. She reports that she does not drink alcohol or use illicit drugs.  Allergies  Allergen Reactions  . Sulfamethoxazole W-Trimethoprim Other (See Comments)    Unknown reaction    Family History  Problem Relation Age of Onset  . Hypertension Mother   . Coronary artery disease Father   . Cancer Brother     Renal cell carcinoma  . Hypertension Other   . Cancer Other   . Heart attack Father   . Coronary artery disease Brother     Prior to Admission medications   Medication Sig Start Date End Date Taking? Authorizing Provider  ibuprofen (ADVIL,MOTRIN) 200 MG tablet Take 400 mg by mouth at bedtime. Back pain.   Yes Historical Provider, MD  levothyroxine (SYNTHROID, LEVOTHROID) 125 MCG tablet Take 125 mcg by mouth daily with breakfast.    Yes Historical Provider, MD  LORazepam (ATIVAN) 1 MG tablet Take 1 mg by mouth 2 (two) times daily.    Yes Historical Provider, MD  sertraline (ZOLOFT) 100 MG tablet Take 200 mg by mouth at bedtime.    Yes Historical Provider, MD  thiothixene (NAVANE) 2 MG capsule Take 2 mg by mouth at bedtime.    Yes Historical Provider, MD   Physical Exam: Filed Vitals:   10/10/11 0421 10/10/11  0730 10/10/11 1007 10/10/11 1041  BP: 123/72 134/73 123/69 126/78  Pulse: 94 100 91 95  Temp: 98.4 F (36.9 C) 98.4 F (36.9 C) 98.4 F (36.9 C) 97.9 F (36.6 C)  TempSrc: Oral Oral Oral Oral  Resp: 16 18 16 20   Height:      Weight:      SpO2: 97% 94% 96% 92%    Physical Exam  Constitutional: Appears well-developed and  well-nourished. No distress.  HENT: Normocephalic. External right and left ear normal. Oropharynx is clear and moist.  Eyes: Conjunctivae and EOM are normal. PERRLA, no scleral icterus.  Neck: Normal ROM. Neck supple. No JVD. No tracheal deviation. No thyromegaly.  CVS: RRR, S1/S2 +, no murmurs, no gallops, no carotid bruit.  Pulmonary: Effort and breath sounds normal, no stridor, rhonchi, wheezes, rales.  Abdominal: Soft. BS +,  no distension, tenderness, rebound or guarding.  Musculoskeletal: Normal range of motion. No edema and no tenderness.  Lymphadenopathy: No lymphadenopathy noted, cervical, inguinal. Neuro: Alert. Normal reflexes, muscle tone coordination. No cranial nerve deficit. Skin: Skin is warm and dry. No rash noted. Not diaphoretic. No erythema. No pallor.  Psychiatric: Normal mood and affect. Behavior, judgment, thought content normal.   Labs on Admission:  Basic Metabolic Panel:  Lab 10/10/11 4098 10/09/11 2143  NA 135 136  K 4.4 4.1  CL 99 98  CO2 25 25  GLUCOSE 133* 98  BUN 10 9  CREATININE 0.63 0.69  CALCIUM 9.0 9.9  MG 2.1 --  PHOS 3.0 --   Liver Function Tests:  Lab 10/10/11 1123 10/09/11 2143  AST 23 27  ALT 21 26  ALKPHOS 52 60  BILITOT 0.3 0.5  PROT 7.5 8.2  ALBUMIN 3.9 4.3   No results found for this basename: LIPASE:5,AMYLASE:5 in the last 168 hours No results found for this basename: AMMONIA:5 in the last 168 hours CBC:  Lab 10/10/11 1123 10/09/11 2143  WBC 8.2 10.1  NEUTROABS 5.9 --  HGB 14.5 15.7*  HCT 41.0 43.7  MCV 87.6 86.2  PLT 178 225   Cardiac Enzymes: No results found for this basename: CKTOTAL:5,CKMB:5,CKMBINDEX:5,TROPONINI:5 in the last 168 hours BNP: No components found with this basename: POCBNP:5 CBG:  Lab 10/10/11 1216  GLUCAP 136*    Radiological Exams on Admission: Ct Head Wo Contrast 10/10/2011   IMPRESSION: Mild white matter changes.  No definite acute intracranial abnormality.    Mr Laqueta Jean Wo  Contrast 10/10/2011  *  IMPRESSION: Minor foci of white matter signal abnormality.  No acute stroke or bleed.  No abnormal post contrast enhancement of the meninges.  This does not exclude viral meningitis.   No parenchymal enhancement is observed.     EKG: Normal sinus rhythm, no ST/T wave changes  Assessment/Plan  Principal  Problem:  *Headache - unclear etiology, patient reports she never had headaches before but after reviewing her chart it seems she has had prior recent ED visits for similar problems - CT head and MRI brain non revealing - will follow up on final spinal fluid cultures - for now continue IV antibiotics: rocephin and vanco for possible bacterial meningitis  Active Problems:  HYPOTHYROIDISM - continue synthroid  Code Status: full code Family Communication: updated pt mother at bedside Disposition Plan: home when stable  Manson Passey, MD  [facility] Pager [phone]  If 7PM-7AM, please contact night-coverage [email or web address] Password [facility] 10/10/2011, 12:22 PM  Time spent: 45 minutes

## 2011-10-10 NOTE — ED Notes (Signed)
Pt currently in MRI. Will transport pt to 3E after MRI. Receiving RN at [facility] awared

## 2011-10-10 NOTE — Progress Notes (Signed)
Patient arrived via stretcher in stable condition. Will continue to monitor throughout shift.  

## 2011-10-10 NOTE — Progress Notes (Signed)
ANTIBIOTIC CONSULT NOTE - INITIAL  Pharmacy Consult for Vancomycin/Rocephin Indication:possible meningitis  Allergies  Allergen Reactions  . Sulfamethoxazole W-Trimethoprim Other (See Comments)    Unknown reaction    Patient Measurements: Height: 4\' 11"  (149.9 cm) Weight: 201 lb (91.173 kg) IBW/kg (Calculated) : 43.2  Adjusted Body Weight:   Vital Signs: Temp: 98.4 F (36.9 C) (07/14 0730) Temp src: Oral (07/14 0730) BP: 134/73 mmHg (07/14 0730) Pulse Rate: 100  (07/14 0730) Intake/Output from previous day:   Intake/Output from this shift:    Labs:  [facility] 10/09/11 2143  WBC 10.1  HGB 15.7*  PLT 225  LABCREA --  CREATININE 0.69   Estimated Creatinine Clearance: 76.4 ml/min (by C-G formula based on Cr of 0.69). No results found for this basename: VANCOTROUGH:2,VANCOPEAK:2,VANCORANDOM:2,GENTTROUGH:2,GENTPEAK:2,GENTRANDOM:2,TOBRATROUGH:2,TOBRAPEAK:2,TOBRARND:2,AMIKACINPEAK:2,AMIKACINTROU:2,AMIKACIN:2, in the last 72 hours   Microbiology: Recent Results (from the past 720 hour(s))  GRAM STAIN     Status: Normal   Collection Time   10/10/11  5:14 AM      Component Value Range Status Comment   Specimen Description CSF   Final    Special Requests NONE   Final    Gram Stain     Final    Value: NO ORGANISMS SEEN WBC PRESENT, PREDOMINANTLY MONONUCLEAR     Gram Stain Report Called to,Read Back By and Verified With: Leanna Sato [id number] 10/10/11   Report Status 10/10/2011 FINAL   Final     Medical History: Past Medical History  Diagnosis Date  . Sleep apnea   . Hypertension   . Thyroid disease   . Arthritis   . Asthma   . Depression   . Anxiety   . Fibromyalgia   . Lumbar spondylolysis   . Allergy   . Endometrial cancer   . Spinal stenosis     Medications:  Scheduled:    . acetaminophen  1,000 mg Oral Once  . cefTRIAXone (ROCEPHIN)  IV  2 g Intravenous To ER  . dexamethasone  10 mg Intravenous Once  .  HYDROmorphone (DILAUDID) injection  1 mg  Intravenous Once  .  HYDROmorphone (DILAUDID) injection  1 mg Intravenous Once  . LORazepam  1 mg Oral Once  .  morphine injection  4 mg Intravenous Once  . sodium chloride  1,000 mL Intravenous Once  . sodium chloride  1,000 mL Intravenous Once  . vancomycin  1,000 mg Intravenous To ER  . DISCONTD: cefTRIAXone (ROCEPHIN)  IV  1 g Intravenous Once   Infusions:   PRN:  Assessment: 56 yo female presenting with headaches starting 2 days ago  Goal of Therapy:  Vancomycin trough level 15-20 mcg/ml  Plan:  Rocephin 2 gm IV q 12 hours--1st recd in ER Vancomycin 1250mg  IV q12 hours--recd 1gm in ER Measure antibiotic drug levels at steady state  Loletta Specter 10/10/2011,8:14 AM

## 2011-10-10 NOTE — ED Provider Notes (Signed)
History     CSN: [id number]  Arrival date & time 10/09/11  2037   First MD Initiated Contact with Patient 10/10/11 0054      Chief Complaint  Patient presents with  . Headache  . Fever     Patient is a 56 y.o. female presenting with headaches. The history is provided by the patient.  Headache  This is a new problem. The current episode started 2 days ago. The problem occurs constantly. The problem has been gradually worsening. The headache is associated with bright light. Pain location: diffuse. The quality of the pain is described as throbbing. The pain is moderate. Radiates to: neck. Associated symptoms include a fever. Pertinent negatives include no vomiting. Treatments tried: rest. The treatment provided no relief.  pt reports at least two days of fever, headache, neck stiffness Denies cough and sore throat No nausea/vomiting.  No abdominal pain.  Reports chronic diarrhea that is unchanged No rash.  No tick bites.   No dysuria She reports myalgias Reports intermittent vaginal bleeding that is not new, reports h/o endometrial CA and likely to have hysterectomy soon  Past Medical History  Diagnosis Date  . Sleep apnea   . Hypertension   . Thyroid disease   . Arthritis   . Asthma   . Depression   . Anxiety   . Fibromyalgia   . Lumbar spondylolysis   . Allergy   . Endometrial cancer   . Spinal stenosis     Past Surgical History  Procedure Date  . Cesarean section     Family History  Problem Relation Age of Onset  . Hypertension Mother   . Coronary artery disease Father   . Cancer Brother     Renal cell carcinoma  . Hypertension Other   . Cancer Other   . Heart attack Father   . Coronary artery disease Brother     History  Substance Use Topics  . Smoking status: Never Smoker   . Smokeless tobacco: Never Used  . Alcohol Use: No    OB History    Grav Para Term Preterm Abortions TAB SAB Ect Mult Living   1 1              Review of Systems    Constitutional: Positive for fever.  Cardiovascular: Negative for chest pain.  Gastrointestinal: Negative for vomiting and abdominal pain.  Genitourinary: Negative for dysuria.  Musculoskeletal: Positive for myalgias.  All other systems reviewed and are negative.    Allergies  Sulfamethoxazole w-trimethoprim  Home Medications   Current Outpatient Rx  Name Route Sig Dispense Refill  . IBUPROFEN 200 MG PO TABS Oral Take 400 mg by mouth at bedtime. Back pain.    Marland Kitchen LEVOTHYROXINE SODIUM 125 MCG PO TABS Oral Take 125 mcg by mouth daily with breakfast.     . LORAZEPAM 1 MG PO TABS Oral Take 1 mg by mouth 2 (two) times daily.     . SERTRALINE HCL 100 MG PO TABS Oral Take 200 mg by mouth at bedtime.     . THIOTHIXENE 2 MG PO CAPS Oral Take 2 mg by mouth at bedtime.       BP 129/83  Pulse 122  Temp 102 F (38.9 C)  Resp 20  Ht 4\' 11"  (1.499 m)  Wt 201 lb (91.173 kg)  BMI 40.60 kg/m2  SpO2 99% BP 123/72  Pulse 94  Temp 98.4 F (36.9 C) (Oral)  Resp 16  Ht 4\' 11"  (1.499 m)  Wt 201 lb (91.173 kg)  BMI 40.60 kg/m2  SpO2 97%   Physical Exam CONSTITUTIONAL: Well developed/well nourished HEAD AND FACE: Normocephalic/atraumatic EYES: EOMI/PERRL ENMT: Mucous membranes moist, pharynx normal NECK: supple no meningeal signs SPINE:entire spine nontender CV: S1/S2 noted, no murmurs/rubs/gallops noted LUNGS: Lungs are clear to auscultation bilaterally, no apparent distress ABDOMEN: soft, nontender, no rebound or guarding GU:no cva tenderness NEURO: Pt is awake/alert, moves all extremitiesx4 EXTREMITIES: pulses normal, full ROM SKIN: warm, color normal PSYCH: no abnormalities of mood noted  ED Course  LUMBAR PUNCTURE Date/Time: 10/10/2011 5:21 AM Performed by: Joya Gaskins Authorized by: Joya Gaskins Consent: Written consent obtained. Risks and benefits: risks, benefits and alternatives were discussed Consent given by: patient Time out: Immediately prior to  procedure a "time out" was called to verify the correct patient, procedure, equipment, support staff and site/side marked as required. Indications: evaluation for infection Anesthesia: local infiltration Local anesthetic: lidocaine 1% without epinephrine Anesthetic total: 3 ml Preparation: Patient was prepped and draped in the usual sterile fashion. Lumbar space: L3-L4 interspace Patient's position: sitting Needle gauge: 20 Number of attempts: 1 Fluid appearance: clear Tubes of fluid: 4 Total volume: 8 ml Post-procedure: site cleaned and adhesive bandage applied Patient tolerance: Patient tolerated the procedure well with no immediate complications. Comments: Pt had no focal neuro deficits after procedure Pt stable after procedure     Labs Reviewed  CBC - Abnormal; Notable for the following:    Hemoglobin 15.7 (*)     All other components within normal limits  COMPREHENSIVE METABOLIC PANEL  URINALYSIS, ROUTINE W REFLEX MICROSCOPIC  1:46 AM Pt with fever, headache.  She is nontoxic in appearance.  Labs/imaging pending at this time 3:31 AM Pt with continued headache Will perform lumbar puncture to r/o meningitis (urine has some evidence of infection, but she denies CVA tenderness and no dysuria reported, doubt cause of fever is uti) 5:56 AM Csf pending, but rocephin ordered for uti 7:21 AM CSF results appear c/w meningitis Decadron ordered/given before rocephin, vancomcyin ordered Pt stable at this time D/w [facility] dr Elisabeth Pigeon, will admit     MDM  Nursing notes including past medical history and social history reviewed and considered in documentation All labs/vitals reviewed and considered         Joya Gaskins, MD 10/10/11 [phone]

## 2011-10-10 NOTE — ED Notes (Signed)
Pt returned from MRI, transporting to 3E right now

## 2011-10-11 DIAGNOSIS — M4800 Spinal stenosis, site unspecified: Secondary | ICD-10-CM

## 2011-10-11 LAB — MRSA PCR SCREENING: MRSA by PCR: NEGATIVE

## 2011-10-11 LAB — COMPREHENSIVE METABOLIC PANEL
AST: 26 U/L (ref 0–37)
Albumin: 3.5 g/dL (ref 3.5–5.2)
Calcium: 8.7 mg/dL (ref 8.4–10.5)
Creatinine, Ser: 0.59 mg/dL (ref 0.50–1.10)
GFR calc non Af Amer: >90 mL/min (ref >90)

## 2011-10-11 LAB — GLUCOSE, CAPILLARY: Glucose-Capillary: 108 mg/dL — ABNORMAL HIGH (ref 70–99)

## 2011-10-11 LAB — CBC
Hemoglobin: 12.9 g/dL (ref 12.0–15.0)
MCH: 30.1 pg (ref 26.0–34.0)
MCV: 88.8 fL (ref 78.0–100.0)
RBC: 4.28 MIL/uL (ref 3.87–5.11)

## 2011-10-11 LAB — PATHOLOGIST SMEAR REVIEW

## 2011-10-11 NOTE — Progress Notes (Addendum)
[facility] PROGRESS NOTE  [name] ZOX:[id number] DOB: 1955-06-01 DOA: 10/09/2011 PCP: Dorrene German, MD  Brief narrative: 56 year old female with history of anxiety, Hypothyroidism, fibromyalgia and spinal stenosis and palpitations who presented to ED with complaints of frontal headache started 4 days prior to this admission, throbbing and intermittent in nature without associated auras. Patient reported no associated fever or chills and no neck stiffness but somewhat of mild neck pain. This morning patient was very upset when I told her she may not have meningitis as she has no clinical signs and/or symptoms but she started verbal abuse directed towards me, she was insulting and I have informed that I will assign different physician from [facility] to take over her care.  Assessment/Plan   Principal Problem:  *Headache  - unclear etiology, patient reports she never had headaches before but after reviewing her chart it seems she has had prior recent ED visits for similar problems  - CT head and MRI brain non revealing  - will follow up on final spinal fluid cultures - so far no organisms seen; patient remains afebrile and no elevated WBC count - for now continue IV antibiotics: rocephin and vanco until final cultures available  Active Problems:  HYPOTHYROIDISM  - continue synthroid   Code Status: full code  Family Communication: updated pt mother today over the phone Disposition Plan: home when stable  Antibiotics: Ceftriaxone 10/09/2011 --> Vancomycin 10/09/2011 -->  Procedures: Lumbar puncture 10/09/2011  Manson Passey, MD  [facility] Pager [phone]  If 7PM-7AM, please contact night-coverage [email or web address] Password [facility] 10/11/2011, 11:33 AM   LOS: 2 days   HPI/Subjective: No acute events overnight.  Objective: Filed Vitals:   10/10/11 1007 10/10/11 1041 10/10/11 2031 10/11/11 0455  BP: 123/69 126/78 125/81 113/71  Pulse: 91 95 110 84  Temp:  98.4 F (36.9 C) 97.9 F (36.6 C) 98 F (36.7 C) 98 F (36.7 C)  TempSrc: Oral Oral Oral Oral  Resp: 16 20 19 18   Height:      Weight:      SpO2: 96% 92% 95% 97%    Intake/Output Summary (Last 24 hours) at 10/11/11 1133 Last data filed at 10/11/11 0600  Gross per 24 hour  Intake 1878.75 ml  Output      0 ml  Net 1878.75 ml    Exam:  patient refused to be examined today   Data Reviewed: Basic Metabolic Panel:  Lab 10/11/11 8295 10/10/11 1123 10/09/11 2143  NA 139 135 136  K 3.8 4.4 4.1  CL 104 99 98  CO2 25 25 25   GLUCOSE 118* 133* 98  BUN 9 10 9   CREATININE 0.59 0.63 0.69  CALCIUM 8.7 9.0 9.9  MG -- 2.1 --  PHOS -- 3.0 --   Liver Function Tests:  Lab 10/11/11 0337 10/10/11 1123 10/09/11 2143  AST 26 23 27   ALT 20 21 26   ALKPHOS 47 52 60  BILITOT 0.2* 0.3 0.5  PROT 6.5 7.5 8.2  ALBUMIN 3.5 3.9 4.3   CBC:  Lab 10/11/11 0337 10/10/11 1123 10/09/11 2143  WBC 9.2 8.2 10.1  NEUTROABS -- 5.9 --  HGB 12.9 14.5 15.7*  HCT 38.0 41.0 43.7  MCV 88.8 87.6 86.2  PLT 200 178 225   CBG:  Lab 10/11/11 0732 10/10/11 1216  GLUCAP 108* 136*    Recent Results (from the past 240 hour(s))  GRAM STAIN     Status: Normal   Collection Time   10/10/11  5:14 AM      Component Value Range Status Comment   Specimen Description CSF   Final    Special Requests NONE   Final    Gram Stain     Final    Value: NO ORGANISMS SEEN WBC PRESENT, PREDOMINANTLY MONONUCLEAR     Gram Stain Report Called to,Read Back By and Verified With: HAYDEN R. BY Rodman Comp [id number] 10/10/11   Report Status 10/10/2011 FINAL   Final   CSF CULTURE     Status: Normal (Preliminary result)   Collection Time   10/10/11  5:15 AM      Component Value Range Status Comment   Specimen Description CSF   Final    Special Requests NONE   Final    Gram Stain     Final    Value: WBC PRESENT, PREDOMINANTLY MONONUCLEAR     NO ORGANISMS SEEN     Gram Stain Report Called to,Read Back By and Verified With: Gram  Stain Report Called to,Read Back By and Verified With: Leanna Sato [id number] 10/10/11 Performed by [facility]   Culture PENDING   Incomplete    Report Status PENDING   Incomplete      Studies: Ct Head Wo Contrast 10/10/2011    IMPRESSION: Mild white matter changes as above.  No definite acute intracranial abnormality.     Mr Laqueta Jean Wo Contrast 10/10/2011  *  IMPRESSION: Minor foci of white matter signal abnormality.  No acute stroke or bleed.  No abnormal post contrast enhancement of the meninges.  This does not exclude viral meningitis.   No parenchymal enhancement is observed.     Scheduled Meds:   . cefTRIAXone (ROCEPHIN)  IV  2 g Intravenous Q12H  . enoxaparin (LOVENOX) injection  30 mg Subcutaneous Q24H  . levothyroxine  125 mcg Oral Q breakfast  . LORazepam  1 mg Oral BID  . pantoprazole  40 mg Oral Q1200  . sertraline  200 mg Oral QHS  . thiothixene  2 mg Oral QHS  . vancomycin  1,250 mg Intravenous Q12H   Continuous Infusions:   . sodium chloride 75 mL/hr at 10/10/11 1257

## 2011-10-12 DIAGNOSIS — E039 Hypothyroidism, unspecified: Secondary | ICD-10-CM

## 2011-10-12 DIAGNOSIS — F329 Major depressive disorder, single episode, unspecified: Secondary | ICD-10-CM

## 2011-10-12 LAB — VANCOMYCIN, TROUGH: Vancomycin Tr: 12.9 ug/mL (ref 10.0–20.0)

## 2011-10-12 MED ORDER — SODIUM CHLORIDE 0.9 % IV SOLN
2.0000 g | INTRAVENOUS | Status: DC
  Administered 2011-10-12 – 2011-10-14 (×11): 2 g via INTRAVENOUS
  Filled 2011-10-12 (×13): qty 2000

## 2011-10-12 MED ORDER — VANCOMYCIN HCL IN DEXTROSE 1-5 GM/200ML-% IV SOLN
1000.0000 mg | Freq: Three times a day (TID) | INTRAVENOUS | Status: DC
  Administered 2011-10-12 – 2011-10-14 (×6): 1000 mg via INTRAVENOUS
  Filled 2011-10-12 (×7): qty 200

## 2011-10-12 NOTE — Consult Note (Addendum)
[facility] CONSULT NOTE     Reason for Consult:    HPI:    [name] is an 56 y.o. female with recently diagnosed endometrial cancer, anxiety, spinal stenosis, depression, OSA, HTN and hyperthyroidism.  Patient noted a HA 42 months ago but cannot give me any history or description of this HA.  This past Thursday she woke up with significant HA and neck discomfort which was exacerbated by sneezing.  She was brought to the ED where CT head showed no abnormality and MRI brain showed no definitive meningeal enhancement. LP showed elevated protein 122, RBC 56, WBC 1160, Lymphocyte 99, Monocyte 1, Segmented 0, clear and colorless.  CSF gram stain was negative, culture is pending.  Patient currently is on Rocephin and Vancomycin for broad coverage of possible meningitis. Neurology was consulted for further recommendations. Patient currently is in bed stating she does "not feel well" but cannot describe why.  She states she has a "full headed feeling and her neck hurts when she leans forward".  She shows no neck stiffness, no phonophobia or photophobia.  She is labile and will start to tear-up when describing her past medical  History.   Past Medical History  Diagnosis Date  . Sleep apnea   . Hypertension   . Thyroid disease   . Arthritis   . Asthma   . Depression   . Anxiety   . Fibromyalgia   . Lumbar spondylolysis   . Allergy   . Endometrial cancer   . Spinal stenosis     Past Surgical History  Procedure Date  . Cesarean section     Family History  Problem Relation Age of Onset  . Hypertension Mother   . Coronary artery disease Father   . Cancer Brother     Renal cell carcinoma  . Hypertension Other   . Cancer Other   . Heart attack Father   . Coronary artery disease Brother     Social History:  reports that she has never smoked. She has never used smokeless tobacco. She reports that she does not drink alcohol or use illicit  drugs.  Allergies  Allergen Reactions  . Sulfamethoxazole W-Trimethoprim Other (See Comments)    Unknown reaction    Medications:    Prior to Admission:  Prescriptions prior to admission  Medication Sig Dispense Refill  . ibuprofen (ADVIL,MOTRIN) 200 MG tablet Take 400 mg by mouth at bedtime. Back pain.      Marland Kitchen levothyroxine (SYNTHROID, LEVOTHROID) 125 MCG tablet Take 125 mcg by mouth daily with breakfast.       . LORazepam (ATIVAN) 1 MG tablet Take 1 mg by mouth 2 (two) times daily.       . sertraline (ZOLOFT) 100 MG tablet Take 200 mg by mouth at bedtime.       Marland Kitchen thiothixene (NAVANE) 2 MG capsule Take 2 mg by mouth at bedtime.        Scheduled:   . cefTRIAXone (ROCEPHIN)  IV  2 g Intravenous Q12H  . enoxaparin (LOVENOX) injection  30 mg Subcutaneous Q24H  . levothyroxine  125 mcg Oral Q breakfast  . LORazepam  1 mg Oral BID  . pantoprazole  40 mg Oral Q1200  . sertraline  200 mg Oral QHS  . thiothixene  2 mg Oral QHS  . vancomycin  1,000 mg Intravenous Q8H  . DISCONTD:  vancomycin  1,250 mg Intravenous Q12H    Review of Systems - General ROS: negative for - chills, fatigue, fever or hot flashes Hematological and Lymphatic ROS: negative for - bruising, fatigue, jaundice or pallor Endocrine ROS: negative for - hair pattern changes, hot flashes, mood swings or skin changes Respiratory ROS: positive for - sneezing, hemoptysis, orthopnea or wheezing Cardiovascular ROS: negative for - dyspnea on exertion, orthopnea, palpitations or shortness of breath Gastrointestinal ROS: negative for - abdominal pain, appetite loss, blood in stools, diarrhea or hematemesis Musculoskeletal ROS: positive for - joint pain, joint stiffness, joint swelling or muscle pain Neurological ROS: positive for - headaches and weakness Dermatological ROS: negative for dry skin, pruritus and rash   Blood pressure 128/69, pulse 86, temperature 98.8 F (37.1 C), temperature source Oral, resp. rate 16, height 4'  11" (1.499 m), weight 92.761 kg (204 lb 8 oz), SpO2 93.00%.   Neurologic Examination:   Mental Status: Alert, oriented X 3.  Speech fluent without evidence of aphasia. Able to follow 3 step commands without difficulty. Mood:labile Memory:intact Thought content appropriate Cranial Nerves: II-Visual fields grossly intact. III/IV/VI-Extraocular movements intact.  Pupils reactive bilaterally. Ptosis not present. V/VII-Smile symmetric VIII-grossly intact IX/X-normal gag XI-bilateral shoulder shrug XII-midline tongue extension Motor: 5/5 bilaterally with normal tone and bulk. Negative brudzinski sign.  Negative kernig's sign.  Sensory: Pinprick and light touch intact throughout, bilaterally Deep Tendon Reflexes: 2+ and symmetric UE, 2+ brisk in bilateral patella, 1+ bilateral achilles.     Plantars:      Right:  downgoing     Left:  Downgoing Cerebellar: Normal finger-to-nose, normal rapid alternating movements and normal heel-to-shin test.   Gait: Not tested as patient states it hurts her head when she stands up   No results found for this basename: cbc, bmp, coags, chol, tri, ldl, hga1c    Results for orders placed during the hospital encounter of 10/09/11 (from the past 48 hour(s))  CBC     Status: Normal   Collection Time   10/10/11 11:23 AM      Component Value Range Comment   WBC 8.2  4.0 - 10.5 K/uL    RBC 4.68  3.87 - 5.11 MIL/uL    Hemoglobin 14.5  12.0 - 15.0 g/dL    HCT 01.0  27.2 - 53.6 %    MCV 87.6  78.0 - 100.0 fL    MCH 31.0  26.0 - 34.0 pg    MCHC 35.4  30.0 - 36.0 g/dL    RDW 64.4  03.4 - 74.2 %    Platelets 178  150 - 400 K/uL   COMPREHENSIVE METABOLIC PANEL     Status: Abnormal   Collection Time   10/10/11 11:23 AM      Component Value Range Comment   Sodium 135  135 - 145 mEq/L    Potassium 4.4  3.5 - 5.1 mEq/L    Chloride 99  96 - 112 mEq/L    CO2 25  19 - 32 mEq/L    Glucose, Bld 133 (*) 70 - 99 mg/dL    BUN 10  6 - 23 mg/dL    Creatinine, Ser 5.95   0.50 - 1.10 mg/dL    Calcium 9.0  8.4 - 63.8 mg/dL    Total Protein 7.5  6.0 - 8.3 g/dL    Albumin 3.9  3.5 - 5.2 g/dL    AST 23  0 - 37 U/L    ALT 21  0 - 35 U/L  Alkaline Phosphatase 52  39 - 117 U/L    Total Bilirubin 0.3  0.3 - 1.2 mg/dL    GFR calc non Af Amer >90  >90 mL/min    GFR calc Af Amer >90  >90 mL/min   MAGNESIUM     Status: Normal   Collection Time   10/10/11 11:23 AM      Component Value Range Comment   Magnesium 2.1  1.5 - 2.5 mg/dL   PHOSPHORUS     Status: Normal   Collection Time   10/10/11 11:23 AM      Component Value Range Comment   Phosphorus 3.0  2.3 - 4.6 mg/dL   DIFFERENTIAL     Status: Abnormal   Collection Time   10/10/11 11:23 AM      Component Value Range Comment   Neutrophils Relative 73  43 - 77 %    Neutro Abs 5.9  1.7 - 7.7 K/uL    Lymphocytes Relative 25  12 - 46 %    Lymphs Abs 2.0  0.7 - 4.0 K/uL    Monocytes Relative 2 (*) 3 - 12 %    Monocytes Absolute 0.2  0.1 - 1.0 K/uL    Eosinophils Relative 1  0 - 5 %    Eosinophils Absolute 0.1  0.0 - 0.7 K/uL    Basophils Relative 0  0 - 1 %    Basophils Absolute 0.0  0.0 - 0.1 K/uL   APTT     Status: Normal   Collection Time   10/10/11 11:23 AM      Component Value Range Comment   aPTT 35  24 - 37 seconds   PROTIME-INR     Status: Normal   Collection Time   10/10/11 11:23 AM      Component Value Range Comment   Prothrombin Time 13.4  11.6 - 15.2 seconds    INR 1.00  0.00 - 1.49   TSH     Status: Normal   Collection Time   10/10/11 11:23 AM      Component Value Range Comment   TSH 3.080  0.350 - 4.500 uIU/mL   GLUCOSE, CAPILLARY     Status: Abnormal   Collection Time   10/10/11 12:16 PM      Component Value Range Comment   Glucose-Capillary 136 (*) 70 - 99 mg/dL    Comment 1 Notify RN     COMPREHENSIVE METABOLIC PANEL     Status: Abnormal   Collection Time   10/11/11  3:37 AM      Component Value Range Comment   Sodium 139  135 - 145 mEq/L    Potassium 3.8  3.5 - 5.1 mEq/L     Chloride 104  96 - 112 mEq/L    CO2 25  19 - 32 mEq/L    Glucose, Bld 118 (*) 70 - 99 mg/dL    BUN 9  6 - 23 mg/dL    Creatinine, Ser 1.61  0.50 - 1.10 mg/dL    Calcium 8.7  8.4 - 09.6 mg/dL    Total Protein 6.5  6.0 - 8.3 g/dL    Albumin 3.5  3.5 - 5.2 g/dL    AST 26  0 - 37 U/L SLIGHT HEMOLYSIS   ALT 20  0 - 35 U/L    Alkaline Phosphatase 47  39 - 117 U/L    Total Bilirubin 0.2 (*) 0.3 - 1.2 mg/dL    GFR calc non Af Amer >90  >  90 mL/min    GFR calc Af Amer >90  >90 mL/min   CBC     Status: Normal   Collection Time   10/11/11  3:37 AM      Component Value Range Comment   WBC 9.2  4.0 - 10.5 K/uL    RBC 4.28  3.87 - 5.11 MIL/uL    Hemoglobin 12.9  12.0 - 15.0 g/dL    HCT 16.1  09.6 - 04.5 %    MCV 88.8  78.0 - 100.0 fL    MCH 30.1  26.0 - 34.0 pg    MCHC 33.9  30.0 - 36.0 g/dL    RDW 40.9  81.1 - 91.4 %    Platelets 200  150 - 400 K/uL   GLUCOSE, CAPILLARY     Status: Abnormal   Collection Time   10/11/11  7:32 AM      Component Value Range Comment   Glucose-Capillary 108 (*) 70 - 99 mg/dL    Comment 1 Documented in Chart      Comment 2 Notify RN     MRSA PCR SCREENING     Status: Normal   Collection Time   10/11/11  2:27 PM      Component Value Range Comment   MRSA by PCR NEGATIVE  NEGATIVE   VANCOMYCIN, TROUGH     Status: Normal   Collection Time   10/12/11  3:40 AM      Component Value Range Comment   Vancomycin Tr 12.9  10.0 - 20.0 ug/mL   GLUCOSE, CAPILLARY     Status: Normal   Collection Time   10/12/11  7:37 AM      Component Value Range Comment   Glucose-Capillary 98  70 - 99 mg/dL    SPINAL FLUID    Glucose, CSF 54            Total Protein, CSF 122            RBC Count, CSF  4 56           WBC, CSF  1160  1405            Segmented Neutrophils-CSF  0 0           Lymphs, CSF  99 99           Monocyte-Macrophage-Spinal Fluid  1 1           Eosinophils, CSF  0 0           Appearance, CSF  CLOUDY CLOUDY           Color, CSF  COLORLESS COLORLESS            Supernatant  NOT INDICATED NOT INDICATED           Tube #  [id number]           No opening pressure was recorded on LP obtained on 10/10/11  MRI brain with and without contrast, per radiology report: Minor foci of white matter signal abnormality. No acute stroke or bleed. No abnormal enhancement of the meninges (per radiology). The appearance does not exclude viral meningitis. No parenchymal enhancement is observed.   MRI personally reviewed: The dura appears somewhat subtly prominent based upon my assessment of the post-contrast MRI images.   Assessment/Plan:   56 YO female with 6 days history of HA and neck discomfort in setting of abnormal CSF showing markedly elevated WBC with lymphocytic predominance and high CSF protein.  MRI and CT brain show no significant abnormality per radiology, although the dura appears somewhat subtly prominent based upon my assessment of the post-contrast MRI images.  Patient was initially febrile but since hospital admission patient has remained afebrile.  Patient is currently covered with Vancomycin and Rocephin for possible meningitis.  At this time differential diagnosis consists of pachymeningitis versus infiltrative malignant etiology such as leukemic meningitis. Highest on DDx for infection would be a viral meningitis (aka aseptic meningitis) due to the lymphocytic predominance of the WBCs in her CSF. Most likely viral etiologies would be arboviruses and enterovirus/echovirus.   Recommend: 1) Repeat LP by IR to obtain opening pressure, Cell count and differential, Fungal stain, HSV PCR, lyme antibody, Arbovirus antibody, Cryptococcal antigen, T Pallidium IgG, VDRL, lymphocytic choriomeningitis CSF IgG titer; save 10 ml CSF for any other studies infectious disease consultant may desire to order.  2) Patient has been taken off Lovenox for LP but placed on SCD's for DVT Px 3) I have discussed with Dr. Cena Benton recommendations for ID consult and he agrees.  4)  Likelihood of bacterial meningitis is quite low based upon lymphocytic predominance in CSF. Will defer to ID regarding whether to D/C vancomycin and/or ceftriaxone now, or to wait for CSF culture results.  5) Dilaudid for pain. Recommend trial of phenergan IV.    Felicie Morn PA-C [facility] [phone]  10/12/2011, 10:06 AM  Electronically signed: Dr. Caryl Pina

## 2011-10-12 NOTE — Progress Notes (Signed)
ANTIBIOTIC CONSULT NOTE - FOLLOW UP  Pharmacy Consult for vancomycin Indication: possible meningitis   Allergies  Allergen Reactions  . Sulfamethoxazole W-Trimethoprim Other (See Comments)    Unknown reaction    Patient Measurements: Height: 4\' 11"  (149.9 cm) Weight: 201 lb (91.173 kg) IBW/kg (Calculated) : 43.2  Adjusted Body Weight:   Vital Signs: Temp: 98.5 F (36.9 C) (07/15 2135) Temp src: Oral (07/15 2135) BP: 138/82 mmHg (07/15 2135) Pulse Rate: 98  (07/15 2135) Intake/Output from previous day: 07/15 0701 - 07/16 0700 In: 1465 [P.O.:600; I.V.:865] Out: 800 [Urine:800] Intake/Output from this shift: Total I/O In: 120 [P.O.:120] Out: -   Labs:  Basename 10/11/11 0337 10/10/11 1123 10/09/11 2143  WBC 9.2 8.2 10.1  HGB 12.9 14.5 15.7*  PLT 200 178 225  LABCREA -- -- --  CREATININE 0.59 0.63 0.69   Estimated Creatinine Clearance: 76.4 ml/min (by C-G formula based on Cr of 0.59).  Basename 10/12/11 0340  VANCOTROUGH 12.9  VANCOPEAK --  Drue Dun --  GENTTROUGH --  GENTPEAK --  GENTRANDOM --  TOBRATROUGH --  TOBRAPEAK --  TOBRARND --  AMIKACINPEAK --  AMIKACINTROU --  AMIKACIN --     Microbiology: Recent Results (from the past 720 hour(s))  GRAM STAIN     Status: Normal   Collection Time   10/10/11  5:14 AM      Component Value Range Status Comment   Specimen Description CSF   Final    Special Requests NONE   Final    Gram Stain     Final    Value: NO ORGANISMS SEEN WBC PRESENT, PREDOMINANTLY MONONUCLEAR     Gram Stain Report Called to,Read Back By and Verified With: Leanna Sato 1610 10/10/11   Report Status 10/10/2011 FINAL   Final   CSF CULTURE     Status: Normal (Preliminary result)   Collection Time   10/10/11  5:15 AM      Component Value Range Status Comment   Specimen Description CSF   Final    Special Requests NONE   Final    Gram Stain     Final    Value: WBC PRESENT, PREDOMINANTLY MONONUCLEAR     NO ORGANISMS SEEN       Gram Stain Report Called to,Read Back By and Verified With: Gram Stain Report Called to,Read Back By and Verified With: Rivka Spring BY Rodman Comp [id number] 10/10/11 Performed by [facility]   Culture NO GROWTH 1 DAY   Final    Report Status PENDING   Incomplete   MRSA PCR SCREENING     Status: Normal   Collection Time   10/11/11  2:27 PM      Component Value Range Status Comment   MRSA by PCR NEGATIVE  NEGATIVE Final     Anti-infectives     Start     Dose/Rate Route Frequency Ordered Stop   10/12/11 1400   vancomycin (VANCOCIN) IVPB 1000 mg/200 mL premix        1,000 mg 200 mL/hr over 60 Minutes Intravenous Every 8 hours 10/12/11 0542     10/10/11 1800   cefTRIAXone (ROCEPHIN) 2 g in dextrose 5 % 50 mL IVPB        2 g 100 mL/hr over 30 Minutes Intravenous Every 12 hours 10/10/11 0825     10/10/11 1700   vancomycin (VANCOCIN) 1,250 mg in sodium chloride 0.9 % 250 mL IVPB  Status:  Discontinued  1,250 mg 166.7 mL/hr over 90 Minutes Intravenous Every 12 hours 10/10/11 0827 10/12/11 0542   10/10/11 0700   vancomycin (VANCOCIN) IVPB 1000 mg/200 mL premix        1,000 mg 200 mL/hr over 60 Minutes Intravenous To Emergency Dept 10/10/11 0636 10/10/11 0832   10/10/11 0700   cefTRIAXone (ROCEPHIN) 2 g in dextrose 5 % 50 mL IVPB        2 g 100 mL/hr over 30 Minutes Intravenous To Emergency Dept 10/10/11 0636 10/10/11 0731   10/10/11 0600   cefTRIAXone (ROCEPHIN) 1 g in dextrose 5 % 50 mL IVPB  Status:  Discontinued        1 g 100 mL/hr over 30 Minutes Intravenous  Once 10/10/11 0556 10/10/11 0642          Assessment: Patient with low level.    Goal of Therapy:  Vancomycin trough level 15-20 mcg/ml  Plan:  Measure antibiotic drug levels at steady state Follow up culture results Change to vancomycin 1gm iv q8hr  Darlina Guys, Jacquenette Shone Crowford 10/12/2011,5:43 AM

## 2011-10-12 NOTE — Consult Note (Signed)
Date of Admission:  10/09/2011  Date of Consult:  10/12/2011  Reason for Consult:Headache Referring Physician: Cena Benton  Impression/Recommendation Headache ? Meningitis continue anbx for now Add ampicillin for listeria coverage.  Check CSF HSV PCR ( I have cancelled the antibody levels) Check CSF arbovirus panel at state lab Check HIV pcr Check CSF cytology  Comment: certainly viral syndromes can cause headaches and a tap such as this. Would also be concerned about carcinomatous meningitis given her hx of endometrial cancer (although this would be VERY unlikely).   Thank you so much for this interesting consult,   Johny Sax [phone]  [name] is an 56 y.o. female.  HPI: 56 yo F adm with hx of endometrial CA dx 2 months ago ("stage 1"). She was unable to have hysterectomy due to "palpitations. Also 2 months ago she was noted to have sinusitis. She was given a rx for doxy but did not take. She has been dx with an infected L mandibular molar and was given a rx for PEN VK 2 weeks ago. She took this for 3 days last week. She now present with 4 d hx of frontal headache on 7-14. Pain was throbbing, intermittent and had no aura. She had  f/c (104 at home on day of adm).  No n/v. She took percocet she had at home without relief.  In hospital she had MRI showing minoe white matter changes. She had LP showing 1160 WBC (99%L), 56 RBC, Prot 122 and Glc. She was started on vancomycin and ceftriaxone.   Past Medical History  Diagnosis Date  . Sleep apnea   . Hypertension   . Thyroid disease   . Arthritis   . Asthma   . Depression   . Anxiety   . Fibromyalgia   . Lumbar spondylolysis   . Allergy   . Endometrial cancer   . Spinal stenosis     Past Surgical History  Procedure Date  . Cesarean section   ergies:   Allergies  Allergen Reactions  . Sulfamethoxazole W-Trimethoprim Other (See Comments)    Unknown reaction    Medications:  Scheduled:   . cefTRIAXone  (ROCEPHIN)  IV  2 g Intravenous Q12H  . levothyroxine  125 mcg Oral Q breakfast  . LORazepam  1 mg Oral BID  . pantoprazole  40 mg Oral Q1200  . sertraline  200 mg Oral QHS  . thiothixene  2 mg Oral QHS  . vancomycin  1,000 mg Intravenous Q8H  . DISCONTD: enoxaparin (LOVENOX) injection  30 mg Subcutaneous Q24H  . DISCONTD: vancomycin  1,250 mg Intravenous Q12H    Social History:  reports that she has never smoked. She has never used smokeless tobacco. She reports that she does not drink alcohol or use illicit drugs.  Family History  Problem Relation Age of Onset  . Hypertension Mother   . Coronary artery disease Father   . Cancer Brother     Renal cell carcinoma  . Hypertension Other   . Cancer Other   . Heart attack Father   . Coronary artery disease Brother     General ROS: she denies sick exposures, no bug/tick bites (except flies), normal BM, normal urination, no wt loss, no rash. No hx eating raw/uncooked/unpasteurized meat or cheeses. See HPI.  Blood pressure 128/69, pulse 86, temperature 98.8 F (37.1 C), temperature source Oral, resp. rate 16, height 4\' 11"  (1.499 m), weight 92.761 kg (204 lb 8 oz), SpO2 93.00%. General appearance: alert, cooperative, no  distress and mildly obese Eyes: negative findings: pupils equal, round, reactive to light and accomodation and no photophobia Throat: cracked L mandibular molar. some teeth missing.  Neck: no adenopathy and thyroid not enlarged, symmetric, no tenderness/mass/nodules Lungs: clear to auscultation bilaterally Heart: regular rate and rhythm Abdomen: normal findings: bowel sounds normal and soft, non-tender Extremities: edema none Skin: no rashes. onychomycosis.    Results for orders placed during the hospital encounter of 10/09/11 (from the past 48 hour(s))  COMPREHENSIVE METABOLIC PANEL     Status: Abnormal   Collection Time   10/11/11  3:37 AM      Component Value Range Comment   Sodium 139  135 - 145 mEq/L     Potassium 3.8  3.5 - 5.1 mEq/L    Chloride 104  96 - 112 mEq/L    CO2 25  19 - 32 mEq/L    Glucose, Bld 118 (*) 70 - 99 mg/dL    BUN 9  6 - 23 mg/dL    Creatinine, Ser 4.09  0.50 - 1.10 mg/dL    Calcium 8.7  8.4 - 81.1 mg/dL    Total Protein 6.5  6.0 - 8.3 g/dL    Albumin 3.5  3.5 - 5.2 g/dL    AST 26  0 - 37 U/L SLIGHT HEMOLYSIS   ALT 20  0 - 35 U/L    Alkaline Phosphatase 47  39 - 117 U/L    Total Bilirubin 0.2 (*) 0.3 - 1.2 mg/dL    GFR calc non Af Amer >90  >90 mL/min    GFR calc Af Amer >90  >90 mL/min   CBC     Status: Normal   Collection Time   10/11/11  3:37 AM      Component Value Range Comment   WBC 9.2  4.0 - 10.5 K/uL    RBC 4.28  3.87 - 5.11 MIL/uL    Hemoglobin 12.9  12.0 - 15.0 g/dL    HCT 91.4  78.2 - 95.6 %    MCV 88.8  78.0 - 100.0 fL    MCH 30.1  26.0 - 34.0 pg    MCHC 33.9  30.0 - 36.0 g/dL    RDW 21.3  08.6 - 57.8 %    Platelets 200  150 - 400 K/uL   GLUCOSE, CAPILLARY     Status: Abnormal   Collection Time   10/11/11  7:32 AM      Component Value Range Comment   Glucose-Capillary 108 (*) 70 - 99 mg/dL    Comment 1 Documented in Chart      Comment 2 Notify RN     MRSA PCR SCREENING     Status: Normal   Collection Time   10/11/11  2:27 PM      Component Value Range Comment   MRSA by PCR NEGATIVE  NEGATIVE   VANCOMYCIN, TROUGH     Status: Normal   Collection Time   10/12/11  3:40 AM      Component Value Range Comment   Vancomycin Tr 12.9  10.0 - 20.0 ug/mL   GLUCOSE, CAPILLARY     Status: Normal   Collection Time   10/12/11  7:37 AM      Component Value Range Comment   Glucose-Capillary 98  70 - 99 mg/dL       Component Value Date/Time   SDES CSF 10/10/2011 0515   SPECREQUEST NONE 10/10/2011 0515   CULT NO GROWTH 1 DAY 10/10/2011 0515  REPTSTATUS PENDING 10/10/2011 0515   No results found. Recent Results (from the past 240 hour(s))  GRAM STAIN     Status: Normal   Collection Time   10/10/11  5:14 AM      Component Value Range Status Comment    Specimen Description CSF   Final    Special Requests NONE   Final    Gram Stain     Final    Value: NO ORGANISMS SEEN WBC PRESENT, PREDOMINANTLY MONONUCLEAR     Gram Stain Report Called to,Read Back By and Verified With: Leanna Sato 0981 10/10/11   Report Status 10/10/2011 FINAL   Final   CSF CULTURE     Status: Normal (Preliminary result)   Collection Time   10/10/11  5:15 AM      Component Value Range Status Comment   Specimen Description CSF   Final    Special Requests NONE   Final    Gram Stain     Final    Value: WBC PRESENT, PREDOMINANTLY MONONUCLEAR     NO ORGANISMS SEEN     Gram Stain Report Called to,Read Back By and Verified With: Gram Stain Report Called to,Read Back By and Verified With: Rivka Spring BY Rodman Comp 1914 10/10/11 Performed by [facility]   Culture NO GROWTH 1 DAY   Final    Report Status PENDING   Incomplete   MRSA PCR SCREENING     Status: Normal   Collection Time   10/11/11  2:27 PM      Component Value Range Status Comment   MRSA by PCR NEGATIVE  NEGATIVE Final       10/12/2011, 12:43 PM     LOS: 3 days

## 2011-10-12 NOTE — Progress Notes (Signed)
[facility] PROGRESS NOTE  Michel Eskelson Teutsch ZOX:[id number] DOB: 06/06/1955 DOA: 10/09/2011 PCP: Dorrene German, MD  Assessment/Plan: Active Problems:  HYPOTHYROIDISM  HYPERTENSION  ASTHMA, EXTRINSIC NOS  Headache  1. Headache - At this point given abnormal CSF findings have decided to consult ID and Neurology to help weigh in on further testing and treatment options.  Will f/u with their recommendations and further work up  2. Hypothyroidism - Stable continue synthroid at home dose.  Last TSH 3.0  3. Asthma - Stable currently  4. Depression - Continue sertraline - patient is emotionally labile and will consider adjusting dose,   Code Status: Full Family Communication: Spoke with mother at bedside Disposition Plan: Pending further work up and clinical improvement in condition.   Brief narrative: Patient is a 56 y/o CF with h/o endometrial cancer (per patient stage I) hypothyroidism, and depression with multiple visits recently to the ED for headaches.  She was admitted for further workup and CT and MRI were obtained which were fairly unremarkable reportedly.  CSF evaluation was found to have abnormal findings and as such I decided to consult Neurology and ID for further recommendations and evaluation.  Consultants:  ID  Neurology  Procedures:  LP for CSF analysis.  Neuro will repeat at this juncture 7/16  Antibiotics:  Ampicillin, ceftriaxone, Vancomycin  HPI/Subjective: Patient continues to c/o headaches.  Denies any focal neurological weakness, fever, blurred vision, or chills. No acute issues overnight.  Objective: Filed Vitals:   10/11/11 1339 10/11/11 2135 10/12/11 0636 10/12/11 1323  BP: 125/81 138/82 128/69 129/78  Pulse: 94 98 86 78  Temp: 97.6 F (36.4 C) 98.5 F (36.9 C) 98.8 F (37.1 C) 98.4 F (36.9 C)  TempSrc: Oral Oral Oral Oral  Resp: 18 18 16 18   Height:      Weight:   92.761 kg (204 lb 8 oz)   SpO2: 97% 99% 93% 95%     Intake/Output Summary (Last 24 hours) at 10/12/11 1431 Last data filed at 10/12/11 1338  Gross per 24 hour  Intake   1825 ml  Output      0 ml  Net   1825 ml    Exam:   General:  Pt in NAD. A and O x 3  Cardiovascular: RRR. No MRG  Respiratory: CTA BL, no wheezes  Abdomen: NT, ND  Neuro: Answers questions appropriately  Psych: Emotionally labile.  Data Reviewed: Basic Metabolic Panel:  Lab 10/11/11 8119 10/10/11 1123 10/09/11 2143  NA 139 135 136  K 3.8 4.4 4.1  CL 104 99 98  CO2 25 25 25   GLUCOSE 118* 133* 98  BUN 9 10 9   CREATININE 0.59 0.63 0.69  CALCIUM 8.7 9.0 9.9  MG -- 2.1 --  PHOS -- 3.0 --   Liver Function Tests:  Lab 10/11/11 0337 10/10/11 1123 10/09/11 2143  AST 26 23 27   ALT 20 21 26   ALKPHOS 47 52 60  BILITOT 0.2* 0.3 0.5  PROT 6.5 7.5 8.2  ALBUMIN 3.5 3.9 4.3   No results found for this basename: LIPASE:5,AMYLASE:5 in the last 168 hours No results found for this basename: AMMONIA:5 in the last 168 hours CBC:  Lab 10/11/11 0337 10/10/11 1123 10/09/11 2143  WBC 9.2 8.2 10.1  NEUTROABS -- 5.9 --  HGB 12.9 14.5 15.7*  HCT 38.0 41.0 43.7  MCV 88.8 87.6 86.2  PLT 200 178 225   Cardiac Enzymes: No results found for this basename: CKTOTAL:5,CKMB:5,CKMBINDEX:5,TROPONINI:5 in the last 168 hours  BNP (last 3 results) No results found for this basename: PROBNP:3 in the last 8760 hours CBG:  Lab 10/12/11 0737 10/11/11 0732 10/10/11 1216  GLUCAP 98 108* 136*    Recent Results (from the past 240 hour(s))  GRAM STAIN     Status: Normal   Collection Time   10/10/11  5:14 AM      Component Value Range Status Comment   Specimen Description CSF   Final    Special Requests NONE   Final    Gram Stain     Final    Value: NO ORGANISMS SEEN WBC PRESENT, PREDOMINANTLY MONONUCLEAR     Gram Stain Report Called to,Read Back By and Verified With: Leanna Sato 1610 10/10/11   Report Status 10/10/2011 FINAL   Final   CSF CULTURE      Status: Normal (Preliminary result)   Collection Time   10/10/11  5:15 AM      Component Value Range Status Comment   Specimen Description CSF   Final    Special Requests NONE   Final    Gram Stain     Final    Value: WBC PRESENT, PREDOMINANTLY MONONUCLEAR     NO ORGANISMS SEEN     Gram Stain Report Called to,Read Back By and Verified With: Gram Stain Report Called to,Read Back By and Verified With: Rivka Spring BY Rodman Comp [id number] 10/10/11 Performed by [facility]   Culture NO GROWTH 2 DAYS   Final    Report Status PENDING   Incomplete   MRSA PCR SCREENING     Status: Normal   Collection Time   10/11/11  2:27 PM      Component Value Range Status Comment   MRSA by PCR NEGATIVE  NEGATIVE Final      Studies: Ct Head Wo Contrast  10/10/2011  *RADIOLOGY REPORT*  Clinical Data: Headache  CT HEAD WITHOUT CONTRAST  Technique:  Contiguous axial images were obtained from the base of the skull through the vertex without contrast.  Comparison: 07/16/2007  Findings: Subcortical white matter hypodensity along the left frontal lobe is unchanged. Mild periventricular white matter hypodensities well.  This may reflect chronic microangiopathic change.  There is no evidence for acute hemorrhage, hydrocephalus, mass lesion, or abnormal extra-axial fluid collection.  No definite CT evidence for acute infarction.  The visualized paranasal sinuses and mastoid air cells are predominately clear.  IMPRESSION: Mild white matter changes as above.  No definite acute intracranial abnormality.  Original Report Authenticated By: Waneta Martins, M.D.   Mr Laqueta Jean VW Contrast  10/10/2011  *RADIOLOGY REPORT*  Clinical Data: Headache.  Fever.  Lumbar puncture suggests meningitis.  MRI HEAD WITHOUT AND WITH CONTRAST  Technique:  Multiplanar, multiecho pulse sequences of the brain and surrounding structures were obtained according to standard protocol without and with intravenous contrast  Contrast: 20mL MULTIHANCE  GADOBENATE DIMEGLUMINE 529 MG/ML IV SOLN  Comparison: CT head 10/10/2011.  Findings: There is no evidence for acute infarction, intracranial hemorrhage, mass lesion, hydrocephalus, or extra-axial fluid. Relatively minor foci of white matter signal abnormality are nonspecific.  There is no atrophy or focal cortical infarct.  No areas of chronic hemorrhage are seen.  Post infusion, there is no definite abnormal enhancement of the brain or meninges.  No parenchymal enhancement is observed.  Calvarium intact.  Clear sinuses and mastoids.  IMPRESSION: Minor foci of white matter signal abnormality.  No acute stroke or bleed.  No abnormal post contrast  enhancement of the meninges.  This does not exclude viral meningitis.   No parenchymal enhancement is observed.  Original Report Authenticated By: Elsie Stain, M.D.    Scheduled Meds:   . ampicillin (OMNIPEN) IV  2 g Intravenous Q4H  . cefTRIAXone (ROCEPHIN)  IV  2 g Intravenous Q12H  . levothyroxine  125 mcg Oral Q breakfast  . LORazepam  1 mg Oral BID  . pantoprazole  40 mg Oral Q1200  . sertraline  200 mg Oral QHS  . thiothixene  2 mg Oral QHS  . vancomycin  1,000 mg Intravenous Q8H  . DISCONTD: enoxaparin (LOVENOX) injection  30 mg Subcutaneous Q24H  . DISCONTD: vancomycin  1,250 mg Intravenous Q12H   Continuous Infusions:   . sodium chloride 75 mL/hr at 10/11/11 2308    Active Problems:  HYPOTHYROIDISM  HYPERTENSION  ASTHMA, EXTRINSIC NOS  Headache    Time spent: > 30 minutes given increase in questions from patient and mother at bedside.  Also spent time discussing with specialist ID and Neurology, documenting.    Penny Pia  [facility] Pager [phone] If 8PM-8AM, please contact night-coverage at [email or web address], password [facility] 10/12/2011, 2:31 PM  LOS: 3 days

## 2011-10-13 ENCOUNTER — Inpatient Hospital Stay (HOSPITAL_COMMUNITY): Payer: Medicaid Other

## 2011-10-13 ENCOUNTER — Encounter (HOSPITAL_COMMUNITY): Payer: Self-pay | Admitting: *Deleted

## 2011-10-13 DIAGNOSIS — G039 Meningitis, unspecified: Secondary | ICD-10-CM

## 2011-10-13 LAB — GRAM STAIN
Gram Stain: NONE SEEN
Special Requests: NORMAL

## 2011-10-13 LAB — CSF CELL COUNT WITH DIFFERENTIAL
RBC Count, CSF: 3 /mm3 — ABNORMAL HIGH
Tube #: 1

## 2011-10-13 LAB — CSF CULTURE W GRAM STAIN: Culture: NO GROWTH

## 2011-10-13 LAB — GLUCOSE, CAPILLARY: Glucose-Capillary: 110 mg/dL — ABNORMAL HIGH (ref 70–99)

## 2011-10-13 NOTE — Progress Notes (Signed)
Pt neuro status WNL post LP. She denies any headaches at this time, SOB, or pain. Will continue to monitor. Site is WNL, bandaid intact.

## 2011-10-13 NOTE — Progress Notes (Signed)
CRITICAL VALUE ALERT  Critical value received:  WBC-CSF 710  Date of notification:  10/13/11   Time of notification:  1715  Critical value read back:yes  Nurse who received alert:  Lowella Bandy, RN  MD notified (1st page):  Dr. Thedore Mins   Time of first page: 1720 MD notified (2nd page):  Time of second page:  Responding MD:  Dr. Thedore Mins. No new orders given. Made aware of all current results.   Time MD responded:  1721

## 2011-10-13 NOTE — Progress Notes (Signed)
INFECTIOUS DISEASE PROGRESS NOTE  ID: [name] is a 56 y.o. female with   Active Problems:  HYPOTHYROIDISM  HYPERTENSION  ASTHMA, EXTRINSIC NOS  Headache  Depression  Subjective: Cont headhache and back pain  Abtx:  Anti-infectives     Start     Dose/Rate Route Frequency Ordered Stop   10/12/11 1430   ampicillin (OMNIPEN) 2 g in sodium chloride 0.9 % 50 mL IVPB        2 g 150 mL/hr over 20 Minutes Intravenous 6 times per day 10/12/11 1318     10/12/11 1400   vancomycin (VANCOCIN) IVPB 1000 mg/200 mL premix        1,000 mg 200 mL/hr over 60 Minutes Intravenous Every 8 hours 10/12/11 0542     10/10/11 1800   cefTRIAXone (ROCEPHIN) 2 g in dextrose 5 % 50 mL IVPB        2 g 100 mL/hr over 30 Minutes Intravenous Every 12 hours 10/10/11 0825     10/10/11 1700   vancomycin (VANCOCIN) 1,250 mg in sodium chloride 0.9 % 250 mL IVPB  Status:  Discontinued        1,250 mg 166.7 mL/hr over 90 Minutes Intravenous Every 12 hours 10/10/11 0827 10/12/11 0542   10/10/11 0700   vancomycin (VANCOCIN) IVPB 1000 mg/200 mL premix        1,000 mg 200 mL/hr over 60 Minutes Intravenous To Emergency Dept 10/10/11 0636 10/10/11 0832   10/10/11 0700   cefTRIAXone (ROCEPHIN) 2 g in dextrose 5 % 50 mL IVPB        2 g 100 mL/hr over 30 Minutes Intravenous To Emergency Dept 10/10/11 0636 10/10/11 0731   10/10/11 0600   cefTRIAXone (ROCEPHIN) 1 g in dextrose 5 % 50 mL IVPB  Status:  Discontinued        1 g 100 mL/hr over 30 Minutes Intravenous  Once 10/10/11 0556 10/10/11 0642          Medications:  Scheduled:   . ampicillin (OMNIPEN) IV  2 g Intravenous Q4H  . cefTRIAXone (ROCEPHIN)  IV  2 g Intravenous Q12H  . levothyroxine  125 mcg Oral Q breakfast  . LORazepam  1 mg Oral BID  . pantoprazole  40 mg Oral Q1200  . sertraline  200 mg Oral QHS  . thiothixene  2 mg Oral QHS  . vancomycin  1,000 mg Intravenous Q8H    Objective: Vital signs in last 24 hours: Temp:  [97.8 F (36.6  C)-98.3 F (36.8 C)] 98.2 F (36.8 C) (07/17 1544) Pulse Rate:  [75-95] 75  (07/17 1544) Resp:  [16-18] 18  (07/17 1544) BP: (119-127)/(80-98) 126/82 mmHg (07/17 1544) SpO2:  [95 %-98 %] 97 % (07/17 1544)   General appearance: alert, cooperative and no distress Eyes: negative findings: pupils equal, round, reactive to light and accomodation and no photophobia Throat: normal findings: oropharynx pink & moist without lesions or evidence of thrush Resp: clear to auscultation bilaterally Cardio: regular rate and rhythm GI: normal findings: bowel sounds normal and soft, non-tender  Lab Results  [facility] 10/11/11 0337  WBC 9.2  HGB 12.9  HCT 38.0  NA 139  K 3.8  CL 104  CO2 25  BUN 9  CREATININE 0.59  GLU --   Liver Panel  Basename 10/11/11 0337  PROT 6.5  ALBUMIN 3.5  AST 26  ALT 20  ALKPHOS 47  BILITOT 0.2*  BILIDIR --  IBILI --   Sedimentation Rate No results  found for this basename: ESRSEDRATE in the last 72 hours C-Reactive Protein No results found for this basename: CRP:2 in the last 72 hours  Microbiology: Recent Results (from the past 240 hour(s))  GRAM STAIN     Status: Normal   Collection Time   10/10/11  5:14 AM      Component Value Range Status Comment   Specimen Description CSF   Final    Special Requests NONE   Final    Gram Stain     Final    Value: NO ORGANISMS SEEN WBC PRESENT, PREDOMINANTLY MONONUCLEAR     Gram Stain Report Called to,Read Back By and Verified With: HAYDEN R. BY Rodman Comp 1610 10/10/11   Report Status 10/10/2011 FINAL   Final   CSF CULTURE     Status: Normal   Collection Time   10/10/11  5:15 AM      Component Value Range Status Comment   Specimen Description CSF   Final    Special Requests NONE   Final    Gram Stain     Final    Value: WBC PRESENT, PREDOMINANTLY MONONUCLEAR     NO ORGANISMS SEEN     Gram Stain Report Called to,Read Back By and Verified With: Gram Stain Report Called to,Read Back By and Verified With:  Rivka Spring BY Rodman Comp [id number] 10/10/11 Performed by [facility]   Culture NO GROWTH 3 DAYS   Final    Report Status 10/13/2011 FINAL   Final   MRSA PCR SCREENING     Status: Normal   Collection Time   10/11/11  2:27 PM      Component Value Range Status Comment   MRSA by PCR NEGATIVE  NEGATIVE Final     Studies/Results: Dg Fluoro Guide Ndl Plc/bx  10/13/2011  *RADIOLOGY REPORT*  Clinical Data: Mental status change  FLUORO GUIDED NEEDLE PLACEMENT  Comparison: MR brain and CT brain of 10/10/2011  Findings: Using sterile technique and local anesthesia, a lumbar puncture was performed at the L3-4 level.  With the patient in the left lateral decubitus position, the opening pressure was measured at 18.5 cm of water.  As requested extra CSF was obtained for laboratory testing, with a total of 16.5 ml of clear CSF obtained and sent for the requested studies.  The patient tolerated the procedure well.  IMPRESSION: 16.5 ml of clear CSF obtained and sent for the requested laboratory studies.  Original Report Authenticated By: Juline Patch, M.D.     Assessment/Plan: Headache  ? Meningitis  continue vancomycin/ceftriaxone/ampicillin Day 2 Await multiple studies from LP (done ~ 1 hour ago) Check CSF HSV PCR ( I have cancelled the antibody levels), [facility] arbovirus panel at [facility], HIV pcr, CSF cytology      Johny Sax Infectious Diseases [phone] 10/13/2011, 5:11 PM   LOS: 4 days

## 2011-10-13 NOTE — Progress Notes (Signed)
[facility]                                                                                Patient Demographics  [name], is a 56 y.o. female  NWG:[id number]  VHQ:[id number]  DOB - [birth year]  Admit date - 10/09/2011  Admitting Physician Alison Murray, MD  Outpatient Primary MD for the patient is Dorrene German, MD  LOS - 4   Chief Complaint  Patient presents with  . Headache  . Fever        Assessment & Plan   1. Headache    - At this point given abnormal CSF findings he have consulted ID and Neurology to help weigh in on further testing and treatment options. For now empiric antibiotics are continued, repeat LP scheduled for 10/13/2011, will await for cultures and cytology results. Appreciate new low and ID input.    2. Hypothyroidism  - Stable continue synthroid at home dose. Last TSH 3.0    3. Asthma  - Stable currently    4. Depression   - Continue sertraline     Code Status: Full    Family Communication: Spoke with mother at bedside    Disposition Plan: Pending further work up and clinical improvement in condition.    Brief narrative:  Patient is a 56 y/o CF with h/o endometrial cancer (per patient stage I) hypothyroidism, and depression with multiple visits recently to the ED for headaches. She was admitted for further workup and CT and MRI were obtained which were fairly unremarkable reportedly. CSF evaluation was found to have abnormal findings and as such I decided to consult Neurology and ID for further recommendations and evaluation.    Consultants:  ID  Neurology   Procedures:  LP for CSF analysis, repeat LP scheduled for 10/13/2011   Antibiotics:  Ampicillin, ceftriaxone, Vancomycin   Time Spent 35   DVT Prophylaxis  SCDs     Leroy Sea M.D on 10/13/2011 at 12:56 PM  Between 7am to 7pm - Pager - [phone]  After 7pm go to [email or web address] - password [email or web address]  And look for the night coverage  person covering for me after hours  [facility] Office  [phone]    Subjective:   [name] today has, and has generalized headaches, No chest pain, No abdominal pain - No Nausea, No new weakness tingling or numbness, No Cough - SOB.   Objective:   Filed Vitals:   10/12/11 0636 10/12/11 1323 10/12/11 2218 10/13/11 0545  BP: 128/69 129/78 124/86 119/80  Pulse: 86 78 95 76  Temp: 98.8 F (37.1 C) 98.4 F (36.9 C) 98.3 F (36.8 C) 98 F (36.7 C)  TempSrc: Oral Oral Oral Oral  Resp: 16 18 16 16   Height:      Weight: 92.761 kg (204 lb 8 oz)     SpO2: 93% 95% 98% 97%    Wt Readings from Last 3 Encounters:  10/12/11 92.761 kg (204 lb 8 oz)  09/20/11 91.173 kg (201 lb)  09/09/11 86.183 kg (190 lb)     Intake/Output Summary (Last 24 hours) at 10/13/11 1256 Last data filed at 10/12/11 2218  Gross per 24  hour  Intake   1260 ml  Output      0 ml  Net   1260 ml    Exam Awake Alert, Oriented X 3, No new F.N deficits, Normal affect [place].AT,PERRAL Supple Neck,No JVD, No cervical lymphadenopathy appriciated.  Symmetrical Chest wall movement, Good air movement bilaterally, CTAB RRR,No Gallops,Rubs or new Murmurs, No Parasternal Heave +ve B.Sounds, Abd Soft, Non tender, No organomegaly appriciated, No rebound - guarding or rigidity. No Cyanosis, Clubbing or edema, No new Rash or bruise      Data Review   CBC  Lab 10/11/11 0337 10/10/11 1123 10/09/11 2143  WBC 9.2 8.2 10.1  HGB 12.9 14.5 15.7*  HCT 38.0 41.0 43.7  PLT 200 178 225  MCV 88.8 87.6 86.2  MCH 30.1 31.0 31.0  MCHC 33.9 35.4 35.9  RDW 13.0 13.1 13.0  LYMPHSABS -- 2.0 --  MONOABS -- 0.2 --  EOSABS -- 0.1 --  BASOSABS -- 0.0 --  BANDABS -- -- --    Chemistries   Lab 10/11/11 0337 10/10/11 1123 10/09/11 2143  NA 139 135 136  K 3.8 4.4 4.1  CL 104 99 98  CO2 25 25 25   GLUCOSE 118* 133* 98  BUN 9 10 9   CREATININE 0.59 0.63 0.69  CALCIUM 8.7 9.0 9.9  MG -- 2.1 --  AST 26 23 27     ALT 20 21 26   ALKPHOS 47 52 60  BILITOT 0.2* 0.3 0.5   ------------------------------------------------------------------------------------------------------------------ estimated creatinine clearance is 77.2 ml/min (by C-G formula based on Cr of 0.59). ------------------------------------------------------------------------------------------------------------------ No results found for this basename: HGBA1C:2 in the last 72 hours ------------------------------------------------------------------------------------------------------------------ No results found for this basename: CHOL:2,HDL:2,LDLCALC:2,TRIG:2,CHOLHDL:2,LDLDIRECT:2 in the last 72 hours ------------------------------------------------------------------------------------------------------------------ No results found for this basename: TSH,T4TOTAL,FREET3,T3FREE,THYROIDAB in the last 72 hours ------------------------------------------------------------------------------------------------------------------ No results found for this basename: VITAMINB12:2,FOLATE:2,FERRITIN:2,TIBC:2,IRON:2,RETICCTPCT:2 in the last 72 hours  Coagulation profile  Lab 10/10/11 1123  INR 1.00  PROTIME --    No results found for this basename: DDIMER:2 in the last 72 hours  Cardiac Enzymes No results found for this basename: CK:3,CKMB:3,TROPONINI:3,MYOGLOBIN:3 in the last 168 hours ------------------------------------------------------------------------------------------------------------------ No components found with this basename: POCBNP:3  Micro Results Recent Results (from the past 240 hour(s))  GRAM STAIN     Status: Normal   Collection Time   10/10/11  5:14 AM      Component Value Range Status Comment   Specimen Description CSF   Final    Special Requests NONE   Final    Gram Stain     Final    Value: NO ORGANISMS SEEN WBC PRESENT, PREDOMINANTLY MONONUCLEAR     Gram Stain Report Called to,Read Back By and Verified With: Leanna Sato 1610 10/10/11   Report Status 10/10/2011 FINAL   Final   CSF CULTURE     Status: Normal   Collection Time   10/10/11  5:15 AM      Component Value Range Status Comment   Specimen Description CSF   Final    Special Requests NONE   Final    Gram Stain     Final    Value: WBC PRESENT, PREDOMINANTLY MONONUCLEAR     NO ORGANISMS SEEN     Gram Stain Report Called to,Read Back By and Verified With: Gram Stain Report Called to,Read Back By and Verified With: Rivka Spring BY Rodman Comp [id number] 10/10/11 Performed by [facility]   Culture NO GROWTH 3 DAYS   Final  Report Status 10/13/2011 FINAL   Final   MRSA PCR SCREENING     Status: Normal   Collection Time   10/11/11  2:27 PM      Component Value Range Status Comment   MRSA by PCR NEGATIVE  NEGATIVE Final     Radiology Reports Ct Head Wo Contrast  10/10/2011  *RADIOLOGY REPORT*  Clinical Data: Headache  CT HEAD WITHOUT CONTRAST  Technique:  Contiguous axial images were obtained from the base of the skull through the vertex without contrast.  Comparison: 07/16/2007  Findings: Subcortical white matter hypodensity along the left frontal lobe is unchanged. Mild periventricular white matter hypodensities well.  This may reflect chronic microangiopathic change.  There is no evidence for acute hemorrhage, hydrocephalus, mass lesion, or abnormal extra-axial fluid collection.  No definite CT evidence for acute infarction.  The visualized paranasal sinuses and mastoid air cells are predominately clear.  IMPRESSION: Mild white matter changes as above.  No definite acute intracranial abnormality.  Original Report Authenticated By: Waneta Martins, M.D.   Mr Laqueta Jean ZO Contrast  10/10/2011  *RADIOLOGY REPORT*  Clinical Data: Headache.  Fever.  Lumbar puncture suggests meningitis.  MRI HEAD WITHOUT AND WITH CONTRAST  Technique:  Multiplanar, multiecho pulse sequences of the brain and surrounding structures were obtained according to standard  protocol without and with intravenous contrast  Contrast: 20mL MULTIHANCE GADOBENATE DIMEGLUMINE 529 MG/ML IV SOLN  Comparison: CT head 10/10/2011.  Findings: There is no evidence for acute infarction, intracranial hemorrhage, mass lesion, hydrocephalus, or extra-axial fluid. Relatively minor foci of white matter signal abnormality are nonspecific.  There is no atrophy or focal cortical infarct.  No areas of chronic hemorrhage are seen.  Post infusion, there is no definite abnormal enhancement of the brain or meninges.  No parenchymal enhancement is observed.  Calvarium intact.  Clear sinuses and mastoids.  IMPRESSION: Minor foci of white matter signal abnormality.  No acute stroke or bleed.  No abnormal post contrast enhancement of the meninges.  This does not exclude viral meningitis.   No parenchymal enhancement is observed.  Original Report Authenticated By: Elsie Stain, M.D.    Scheduled Meds:   . ampicillin (OMNIPEN) IV  2 g Intravenous Q4H  . cefTRIAXone (ROCEPHIN)  IV  2 g Intravenous Q12H  . levothyroxine  125 mcg Oral Q breakfast  . LORazepam  1 mg Oral BID  . pantoprazole  40 mg Oral Q1200  . sertraline  200 mg Oral QHS  . thiothixene  2 mg Oral QHS  . vancomycin  1,000 mg Intravenous Q8H   Continuous Infusions:   . sodium chloride 75 mL/hr at 10/13/11 1208   PRN Meds:.diphenhydrAMINE, HYDROmorphone (DILAUDID) injection

## 2011-10-13 NOTE — Progress Notes (Signed)
ANTIBIOTIC CONSULT NOTE - FOLLOW UP  Pharmacy Consult for vancomycin Indication: possible meningitis   Allergies  Allergen Reactions  . Sulfamethoxazole W-Trimethoprim Other (See Comments)    Unknown reaction    Patient Measurements: Height: 4\' 11"  (149.9 cm) Weight: 204 lb 8 oz (92.761 kg) IBW/kg (Calculated) : 43.2  Adjusted Body Weight:   Vital Signs: Temp: 98 F (36.7 C) (07/17 1741) Temp src: Oral (07/17 1741) BP: 114/70 mmHg (07/17 1741) Pulse Rate: 93  (07/17 1741) Intake/Output from previous day: 07/16 0701 - 07/17 0700 In: 1260 [P.O.:360; I.V.:600; IV Piggyback:300] Out: -  Intake/Output from this shift: Total I/O In: 690 [P.O.:240; I.V.:400; IV Piggyback:50] Out: -   Labs:  Basename 10/11/11 0337  WBC 9.2  HGB 12.9  PLT 200  LABCREA --  CREATININE 0.59   Estimated Creatinine Clearance: 77.2 ml/min (by C-G formula based on Cr of 0.59).  Basename 10/13/11 2140 10/12/11 0340  VANCOTROUGH 18.2 12.9  VANCOPEAK -- --  Drue Dun -- --  GENTTROUGH -- --  GENTPEAK -- --  GENTRANDOM -- --  TOBRATROUGH -- --  TOBRAPEAK -- --  TOBRARND -- --  AMIKACINPEAK -- --  AMIKACINTROU -- --  AMIKACIN -- --     Microbiology: Recent Results (from the past 720 hour(s))  GRAM STAIN     Status: Normal   Collection Time   10/10/11  5:14 AM      Component Value Range Status Comment   Specimen Description CSF   Final    Special Requests NONE   Final    Gram Stain     Final    Value: NO ORGANISMS SEEN WBC PRESENT, PREDOMINANTLY MONONUCLEAR     Gram Stain Report Called to,Read Back By and Verified With: HAYDEN R. BY Rodman Comp 1324 10/10/11   Report Status 10/10/2011 FINAL   Final   CSF CULTURE     Status: Normal   Collection Time   10/10/11  5:15 AM      Component Value Range Status Comment   Specimen Description CSF   Final    Special Requests NONE   Final    Gram Stain     Final    Value: WBC PRESENT, PREDOMINANTLY MONONUCLEAR     NO ORGANISMS SEEN   Gram Stain Report Called to,Read Back By and Verified With: Gram Stain Report Called to,Read Back By and Verified With: Rivka Spring BY Rodman Comp [id number] 10/10/11 Performed by [facility]   Culture NO GROWTH 3 DAYS   Final    Report Status 10/13/2011 FINAL   Final   MRSA PCR SCREENING     Status: Normal   Collection Time   10/11/11  2:27 PM      Component Value Range Status Comment   MRSA by PCR NEGATIVE  NEGATIVE Final   GRAM STAIN     Status: Normal   Collection Time   10/13/11  3:20 PM      Component Value Range Status Comment   Specimen Description CSF   Final    Special Requests Normal   Final    Gram Stain     Final    Value: NO ORGANISMS SEEN     RARE WBC SEEN     N COUSAR AT 1805 ON 07.17.2013 BY NBROOKS   Report Status 10/13/2011 FINAL   Final     Anti-infectives     Start     Dose/Rate Route Frequency Ordered Stop   10/12/11 1430   ampicillin (  OMNIPEN) 2 g in sodium chloride 0.9 % 50 mL IVPB        2 g 150 mL/hr over 20 Minutes Intravenous 6 times per day 10/12/11 1318     10/12/11 1400   vancomycin (VANCOCIN) IVPB 1000 mg/200 mL premix        1,000 mg 200 mL/hr over 60 Minutes Intravenous Every 8 hours 10/12/11 0542     10/10/11 1800   cefTRIAXone (ROCEPHIN) 2 g in dextrose 5 % 50 mL IVPB        2 g 100 mL/hr over 30 Minutes Intravenous Every 12 hours 10/10/11 0825     10/10/11 1700   vancomycin (VANCOCIN) 1,250 mg in sodium chloride 0.9 % 250 mL IVPB  Status:  Discontinued        1,250 mg 166.7 mL/hr over 90 Minutes Intravenous Every 12 hours 10/10/11 0827 10/12/11 0542   10/10/11 0700   vancomycin (VANCOCIN) IVPB 1000 mg/200 mL premix        1,000 mg 200 mL/hr over 60 Minutes Intravenous To Emergency Dept 10/10/11 0636 10/10/11 0832   10/10/11 0700   cefTRIAXone (ROCEPHIN) 2 g in dextrose 5 % 50 mL IVPB        2 g 100 mL/hr over 30 Minutes Intravenous To Emergency Dept 10/10/11 0636 10/10/11 0731   10/10/11 0600   cefTRIAXone (ROCEPHIN) 1 g in dextrose 5 %  50 mL IVPB  Status:  Discontinued        1 g 100 mL/hr over 30 Minutes Intravenous  Once 10/10/11 0556 10/10/11 0642          Assessment: S/P LP today Vanc trough = 18.2 mcg/ml on Vancomycin 1gm IV q8h (within therapeutic range)  Goal of Therapy:  Vancomycin trough level 15-20 mcg/ml  Plan:  Continue Vancomycin 1000mg  IV q8h Follow culture data  Shree Espey, Joselyn Glassman, PharmD 10/13/2011,10:57 PM

## 2011-10-13 NOTE — Procedures (Signed)
Using sterile technique and local anesthesia, a lumbar puncture was performed at the L3-4 level.  With the patient in the left lateral decubitus position, the opening pressure was measured at 18.5 cm of water.  As requested extra CSF was obtained for laboratory testing, with a total of 16.5 ml of clear CSF obtained and sent for the requested studies.  The patient tolerated the procedure well.

## 2011-10-14 ENCOUNTER — Inpatient Hospital Stay (HOSPITAL_COMMUNITY)
Admit: 2011-10-14 | Discharge: 2011-10-14 | Disposition: A | Discharge status: Home / Self Care | Payer: Medicaid Other | Attending: Internal Medicine | Admitting: Internal Medicine

## 2011-10-14 DIAGNOSIS — R51 Headache: Secondary | ICD-10-CM

## 2011-10-14 LAB — CBC WITH DIFFERENTIAL/PLATELET
Basophils Relative: 0 % (ref 0–1)
Eosinophils Absolute: 0.4 10*3/uL (ref 0.0–0.7)
Hemoglobin: 13.7 g/dL (ref 12.0–15.0)
MCH: 30.1 pg (ref 26.0–34.0)
MCHC: 34.3 g/dL (ref 30.0–36.0)
Monocytes Relative: 7 % (ref 3–12)
Neutrophils Relative %: 51 % (ref 43–77)

## 2011-10-14 LAB — BASIC METABOLIC PANEL
BUN: 7 mg/dL (ref 6–23)
Creatinine, Ser: 0.67 mg/dL (ref 0.50–1.10)
GFR calc Af Amer: >90 mL/min (ref >90)
GFR calc non Af Amer: >90 mL/min (ref >90)
Potassium: 3.9 mEq/L (ref 3.5–5.1)

## 2011-10-14 LAB — HIV-1 RNA QUANT-NO REFLEX-BLD: HIV-1 RNA Quant, Log: <1.3 {Log} (ref <1.30)

## 2011-10-14 MED ORDER — ACYCLOVIR SODIUM 50 MG/ML IV SOLN
10.0000 mg/kg | Freq: Three times a day (TID) | INTRAVENOUS | Status: DC
  Administered 2011-10-14 – 2011-10-15 (×3): 925 mg via INTRAVENOUS
  Filled 2011-10-14 (×4): qty 18.5

## 2011-10-14 MED ORDER — DEXTROSE 5 % IV SOLN
10.0000 mg/kg | Freq: Three times a day (TID) | INTRAVENOUS | Status: DC
  Administered 2011-10-14: 925 mg via INTRAVENOUS
  Filled 2011-10-14 (×3): qty 18.5

## 2011-10-14 NOTE — Progress Notes (Signed)
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 Sulfamethoxazole W-Trimethoprim Other (See Comments)    Unknown reaction    Patient Measurements: Height: 4\' 11"  (149.9 cm) Weight: 203 lb 14.4 oz (92.488 kg) IBW/kg (Calculated) : 43.2  Adjusted Body Weight:   Vital Signs: Temp: 98.4 F (36.9 C) (07/18 0440) Temp src: Oral (07/18 0440) BP: 101/67 mmHg (07/18 0440) Pulse Rate: 77  (07/18 0440) Intake/Output from previous day: 07/17 0701 - 07/18 0700 In: 2265 [P.O.:540; I.V.:1375; IV Piggyback:350] Out: -  Intake/Output from this shift:    Labs:  [facility] 10/14/11 0335  WBC 8.2  HGB 13.7  PLT 214  LABCREA --  CREATININE 0.67   Estimated Creatinine Clearance: 77 ml/min (by C-G formula based on Cr of 0.67).  Basename 10/13/11 2140 10/12/11 0340  VANCOTROUGH 18.2 12.9  VANCOPEAK -- --  Drue Dun -- --  GENTTROUGH -- --  GENTPEAK -- --  GENTRANDOM -- --  TOBRATROUGH -- --  TOBRAPEAK -- --  TOBRARND -- --  AMIKACINPEAK -- --  AMIKACINTROU -- --  AMIKACIN -- --     Microbiology: Recent Results (from the past 720 hour(s))  GRAM STAIN     Status: Normal   Collection Time   10/10/11  5:14 AM      Component Value Range Status Comment   Specimen Description CSF   Final    Special Requests NONE   Final    Gram Stain     Final    Value: NO ORGANISMS SEEN WBC PRESENT, PREDOMINANTLY MONONUCLEAR     Gram Stain Report Called to,Read Back By and Verified With: HAYDEN R. BY Rodman Comp [id number] 10/10/11   Report Status 10/10/2011 FINAL   Final   CSF CULTURE     Status: Normal   Collection Time   10/10/11  5:15 AM      Component Value Range Status Comment   Specimen Description CSF   Final    Special Requests NONE   Final    Gram Stain     Final    Value: WBC PRESENT, PREDOMINANTLY MONONUCLEAR     NO ORGANISMS SEEN     Gram Stain Report Called to,Read Back By and Verified  With: Gram Stain Report Called to,Read Back By and Verified With: Rivka Spring BY Rodman Comp 1191 10/10/11 Performed by [facility]   Culture NO GROWTH 3 DAYS   Final    Report Status 10/13/2011 FINAL   Final   MRSA PCR SCREENING     Status: Normal   Collection Time   10/11/11  2:27 PM      Component Value Range Status Comment   MRSA by PCR NEGATIVE  NEGATIVE Final   GRAM STAIN     Status: Normal   Collection Time   10/13/11  3:20 PM      Component Value Range Status Comment   Specimen Description CSF   Final    Special Requests Normal   Final    Gram Stain     Final    Value: NO ORGANISMS SEEN     RARE WBC SEEN     N COUSAR AT 1805 ON 07.17.2013 BY NBROOKS   Report Status 10/13/2011 FINAL   Final   CSF CULTURE     Status: Normal (Preliminary result)   Collection Time   10/13/11  3:20 PM      Component Value Range Status  Comment   Specimen Description CSF   Final    Special Requests Normal   Final    Gram Stain     Final    Value: NO WBC SEEN     NO ORGANISMS SEEN   Culture PENDING   Incomplete    Report Status PENDING   Incomplete     Medical History: Past Medical History  Diagnosis Date  . Sleep apnea   . Hypertension   . Thyroid disease   . Arthritis   . Asthma   . Depression   . Anxiety   . Fibromyalgia   . Lumbar spondylolysis   . Allergy   . Endometrial cancer   . Spinal stenosis     Medications:  Prescriptions prior to admission  Medication Sig Dispense Refill  . ibuprofen (ADVIL,MOTRIN) 200 MG tablet Take 400 mg by mouth at bedtime. Back pain.      Marland Kitchen levothyroxine (SYNTHROID, LEVOTHROID) 125 MCG tablet Take 125 mcg by mouth daily with breakfast.       . LORazepam (ATIVAN) 1 MG tablet Take 1 mg by mouth 2 (two) times daily.       . sertraline (ZOLOFT) 100 MG tablet Take 200 mg by mouth at bedtime.       Marland Kitchen thiothixene (NAVANE) 2 MG capsule Take 2 mg by mouth at bedtime.        Scheduled:    . acyclovir  10 mg/kg Intravenous Q8H  . ampicillin  (OMNIPEN) IV  2 g Intravenous Q4H  . cefTRIAXone (ROCEPHIN)  IV  2 g Intravenous Q12H  . levothyroxine  125 mcg Oral Q breakfast  . LORazepam  1 mg Oral BID  . pantoprazole  40 mg Oral Q1200  . sertraline  200 mg Oral QHS  . thiothixene  2 mg Oral QHS  . vancomycin  1,000 mg Intravenous Q8H   Infusions:    . sodium chloride 75 mL/hr at 10/14/11 0546   Assessment: 56 yo F currently on vanc/ceftriaxone/ampicllin for r/o meningitis now to start IV acyclovir for possible HSV encephalitis.    Plan:  1. Start IV acyclovir 10 mg/kg q 8 hours for possible HSV encephalitis. 2. Will continue to follow and adjust as necessary.  3. F/u HSV PCR   Nicasio Barlowe, Lorra Hals 10/14/2011,9:13 AM

## 2011-10-14 NOTE — Progress Notes (Signed)
Pt refused to keep scd's on.

## 2011-10-14 NOTE — Progress Notes (Signed)
[facility]                                                                                Patient Demographics  [name], is a 56 y.o. female  NWG:[id number]  [id number]:[id number]  DOB - [birth year]  Admit date - 10/09/2011  Admitting Physician Alison Murray, MD  Outpatient Primary MD for the patient is Dorrene German, MD  LOS - 5   Chief Complaint  Patient presents with  . Headache  . Fever        Assessment & Plan   1. Headache    - At this point given abnormal CSF findings he have consulted ID and Neurology to help weigh in on further testing and treatment options. Past with infectious disease Dr. Ninetta Lights on 10/14/2011 unlikely that this is a bacterial infection noting that too CSF studies from 2 different LPs, stop all antibiotics, more suspicious for viral infection, we'll place her on IV acyclovir pharmacy to dose and manage. We'll await cytology results and final results from CSF studies as ordered by neurology and ID. For the time being will also add an EEG to her workup.    2. Hypothyroidism  - Stable continue synthroid at home dose. Last TSH 3.0    3. Asthma  - Stable currently    4. Depression   - Continue sertraline     Code Status: Full    Family Communication: Spoke with mother at bedside    Disposition Plan: Pending further work up and clinical improvement in condition.    Brief narrative:  Patient is a 56 y/o CF with h/o endometrial cancer (per patient stage I) hypothyroidism, and depression with multiple visits recently to the ED for headaches. She was admitted for further workup and CT and MRI were obtained which were fairly unremarkable reportedly. CSF evaluation was found to have abnormal findings and as such, neurology and ID following.    Consultants:  ID  Neurology   Procedures:  LP for CSF analysis, repeat LPon 10/13/2011   Antibiotics:  Ampicillin, ceftriaxone, Vancomycin   Time Spent 35   DVT  Prophylaxis  SCDs     Leroy Sea M.D on 10/14/2011 at 11:27 AM  Between 7am to 7pm - Pager - [phone]  After 7pm go to [email or web address] - password [email or web address]  And look for the night coverage person covering for me after hours  [facility] Office  [phone]    Subjective:   [name] today has, and has generalized headaches, No chest pain, No abdominal pain - No Nausea, No new weakness tingling or numbness, No Cough - SOB.   Objective:   Filed Vitals:   10/13/11 1630 10/13/11 1741 10/13/11 2308 10/14/11 0440  BP: 126/79 114/70 113/75 101/67  Pulse: 78 93 81 77  Temp:  98 F (36.7 C) 98.1 F (36.7 C) 98.4 F (36.9 C)  TempSrc: Oral Oral Oral Oral  Resp: 18 20 18 16   Height:      Weight:    92.488 kg (203 lb 14.4 oz)  SpO2:  98% 95% 96%    Wt Readings from Last 3 Encounters:  10/14/11 92.488 kg (203 lb 14.4 oz)  09/20/11 91.173 kg (201 lb)  09/09/11 86.183 kg (190 lb)     Intake/Output Summary (Last 24 hours) at 10/14/11 1127 Last data filed at 10/14/11 0400  Gross per 24 hour  Intake   2145 ml  Output      0 ml  Net   2145 ml    Exam Awake Alert, Oriented X 3, No new F.N deficits, Normal affect [place].AT,PERRAL Supple Neck,No JVD, No cervical lymphadenopathy appriciated.  Symmetrical Chest wall movement, Good air movement bilaterally, CTAB RRR,No Gallops,Rubs or new Murmurs, No Parasternal Heave +ve B.Sounds, Abd Soft, Non tender, No organomegaly appriciated, No rebound - guarding or rigidity. No Cyanosis, Clubbing or edema, No new Rash or bruise      Data Review   CBC  Lab 10/14/11 0335 10/11/11 0337 10/10/11 1123 10/09/11 2143  WBC 8.2 9.2 8.2 10.1  HGB 13.7 12.9 14.5 15.7*  HCT 40.0 38.0 41.0 43.7  PLT 214 200 178 225  MCV 87.9 88.8 87.6 86.2  MCH 30.1 30.1 31.0 31.0  MCHC 34.3 33.9 35.4 35.9  RDW 13.0 13.0 13.1 13.0  LYMPHSABS 3.0 -- 2.0 --  MONOABS 0.6 -- 0.2 --  EOSABS 0.4 -- 0.1 --  BASOSABS 0.0 -- 0.0 --  BANDABS --  -- -- --    Chemistries   Lab 10/14/11 0335 10/11/11 0337 10/10/11 1123 10/09/11 2143  NA 136 139 135 136  K 3.9 3.8 4.4 4.1  CL 98 104 99 98  CO2 28 25 25 25   GLUCOSE 94 118* 133* 98  BUN 7 9 10 9   CREATININE 0.67 0.59 0.63 0.69  CALCIUM 9.3 8.7 9.0 9.9  MG 2.3 -- 2.1 --  AST -- 26 23 27   ALT -- 20 21 26   ALKPHOS -- 47 52 60  BILITOT -- 0.2* 0.3 0.5   ------------------------------------------------------------------------------------------------------------------ estimated creatinine clearance is 77 ml/min (by C-G formula based on Cr of 0.67). ------------------------------------------------------------------------------------------------------------------ No results found for this basename: HGBA1C:2 in the last 72 hours ------------------------------------------------------------------------------------------------------------------ No results found for this basename: CHOL:2,HDL:2,LDLCALC:2,TRIG:2,CHOLHDL:2,LDLDIRECT:2 in the last 72 hours ------------------------------------------------------------------------------------------------------------------ No results found for this basename: TSH,T4TOTAL,FREET3,T3FREE,THYROIDAB in the last 72 hours ------------------------------------------------------------------------------------------------------------------ No results found for this basename: VITAMINB12:2,FOLATE:2,FERRITIN:2,TIBC:2,IRON:2,RETICCTPCT:2 in the last 72 hours  Coagulation profile  Lab 10/10/11 1123  INR 1.00  PROTIME --    No results found for this basename: DDIMER:2 in the last 72 hours  Cardiac Enzymes No results found for this basename: CK:3,CKMB:3,TROPONINI:3,MYOGLOBIN:3 in the last 168 hours ------------------------------------------------------------------------------------------------------------------ No components found with this basename: POCBNP:3  Micro Results Recent Results (from the past 240 hour(s))  GRAM STAIN     Status: Normal    Collection Time   10/10/11  5:14 AM      Component Value Range Status Comment   Specimen Description CSF   Final    Special Requests NONE   Final    Gram Stain     Final    Value: NO ORGANISMS SEEN WBC PRESENT, PREDOMINANTLY MONONUCLEAR     Gram Stain Report Called to,Read Back By and Verified With: Leanna Sato 0454 10/10/11   Report Status 10/10/2011 FINAL   Final   CSF CULTURE     Status: Normal   Collection Time   10/10/11  5:15 AM      Component Value Range Status Comment   Specimen Description CSF   Final    Special Requests NONE   Final    Gram Stain     Final  Value: WBC PRESENT, PREDOMINANTLY MONONUCLEAR     NO ORGANISMS SEEN     Gram Stain Report Called to,Read Back By and Verified With: Gram Stain Report Called to,Read Back By and Verified With: Rivka Spring BY Rodman Comp 1191 10/10/11 Performed by [facility]   Culture NO GROWTH 3 DAYS   Final    Report Status 10/13/2011 FINAL   Final   MRSA PCR SCREENING     Status: Normal   Collection Time   10/11/11  2:27 PM      Component Value Range Status Comment   MRSA by PCR NEGATIVE  NEGATIVE Final   GRAM STAIN     Status: Normal   Collection Time   10/13/11  3:20 PM      Component Value Range Status Comment   Specimen Description CSF   Final    Special Requests Normal   Final    Gram Stain     Final    Value: NO ORGANISMS SEEN     RARE WBC SEEN     N COUSAR AT 1805 ON 07.17.2013 BY NBROOKS   Report Status 10/13/2011 FINAL   Final   CSF CULTURE     Status: Normal (Preliminary result)   Collection Time   10/13/11  3:20 PM      Component Value Range Status Comment   Specimen Description CSF   Final    Special Requests Normal   Final    Gram Stain     Final    Value: NO WBC SEEN     NO ORGANISMS SEEN   Culture PENDING   Incomplete    Report Status PENDING   Incomplete     Radiology Reports Ct Head Wo Contrast  10/10/2011  *RADIOLOGY REPORT*  Clinical Data: Headache  CT HEAD WITHOUT CONTRAST   Technique:  Contiguous axial images were obtained from the base of the skull through the vertex without contrast.  Comparison: 07/16/2007  Findings: Subcortical white matter hypodensity along the left frontal lobe is unchanged. Mild periventricular white matter hypodensities well.  This may reflect chronic microangiopathic change.  There is no evidence for acute hemorrhage, hydrocephalus, mass lesion, or abnormal extra-axial fluid collection.  No definite CT evidence for acute infarction.  The visualized paranasal sinuses and mastoid air cells are predominately clear.  IMPRESSION: Mild white matter changes as above.  No definite acute intracranial abnormality.  Original Report Authenticated By: Waneta Martins, M.D.   Mr Laqueta Jean YN Contrast  10/10/2011  *RADIOLOGY REPORT*  Clinical Data: Headache.  Fever.  Lumbar puncture suggests meningitis.  MRI HEAD WITHOUT AND WITH CONTRAST  Technique:  Multiplanar, multiecho pulse sequences of the brain and surrounding structures were obtained according to standard protocol without and with intravenous contrast  Contrast: 20mL MULTIHANCE GADOBENATE DIMEGLUMINE 529 MG/ML IV SOLN  Comparison: CT head 10/10/2011.  Findings: There is no evidence for acute infarction, intracranial hemorrhage, mass lesion, hydrocephalus, or extra-axial fluid. Relatively minor foci of white matter signal abnormality are nonspecific.  There is no atrophy or focal cortical infarct.  No areas of chronic hemorrhage are seen.  Post infusion, there is no definite abnormal enhancement of the brain or meninges.  No parenchymal enhancement is observed.  Calvarium intact.  Clear sinuses and mastoids.  IMPRESSION: Minor foci of white matter signal abnormality.  No acute stroke or bleed.  No abnormal post contrast enhancement of the meninges.  This does not exclude viral meningitis.   No parenchymal enhancement is observed.  Original Report Authenticated By: Elsie Stain, M.D.    Scheduled Meds:     . acyclovir  10 mg/kg Intravenous Q8H  . levothyroxine  125 mcg Oral Q breakfast  . LORazepam  1 mg Oral BID  . pantoprazole  40 mg Oral Q1200  . sertraline  200 mg Oral QHS  . thiothixene  2 mg Oral QHS  . DISCONTD: ampicillin (OMNIPEN) IV  2 g Intravenous Q4H  . DISCONTD: cefTRIAXone (ROCEPHIN)  IV  2 g Intravenous Q12H  . DISCONTD: vancomycin  1,000 mg Intravenous Q8H   Continuous Infusions:    . sodium chloride 75 mL/hr at 10/14/11 0546   PRN Meds:.diphenhydrAMINE, HYDROmorphone (DILAUDID) injection

## 2011-10-14 NOTE — Progress Notes (Signed)
[facility] PROGRESS NOTE    SUBJECTIVE   Still having mild HA but no other complaints.   OBJECTIVE   Vital signs in last 24 hours: Temp:  [97.8 F (36.6 C)-98.5 F (36.9 C)] 98.4 F (36.9 C) (07/18 0440) Pulse Rate:  [75-93] 77  (07/18 0440) Resp:  [16-20] 16  (07/18 0440) BP: (101-128)/(67-98) 101/67 mmHg (07/18 0440) SpO2:  [95 %-98 %] 96 % (07/18 0440) Weight:  [92.488 kg (203 lb 14.4 oz)] 92.488 kg (203 lb 14.4 oz) (07/18 0440)  Intake/Output from previous day: 07/17 0701 - 07/18 0700 In: 2265 [P.O.:540; I.V.:1375; IV Piggyback:350] Out: -  Intake/Output this shift:   Nutritional status: General  Past Medical History  Diagnosis Date  . Sleep apnea   . Hypertension   . Thyroid disease   . Arthritis   . Asthma   . Depression   . Anxiety   . Fibromyalgia   . Lumbar spondylolysis   . Allergy   . Endometrial cancer   . Spinal stenosis     Neurologic Exam:  Mental Status:  Alert, oriented X 3. Speech fluent without evidence of aphasia. Able to follow 3 step commands without difficulty.  Mood: anxious Memory:intact  Thought content appropriate  Cranial Nerves:  II-Visual fields grossly intact.  III/IV/VI-Extraocular movements intact. Pupils reactive bilaterally. Ptosis not present.  V/VII-Smile symmetric  VIII-grossly intact  IX/X-normal gag  XI-bilateral shoulder shrug  XII-midline tongue extension  Motor: 5/5 bilaterally with normal tone and bulk. Sensory: Pinprick and light touch intact throughout, bilaterally  Deep Tendon Reflexes: 2+ and symmetric UE, 2+ brisk in bilateral patella, 1+ bilateral achilles.  Plantars: Right: downgoing Left: Downgoing  Cerebellar: Normal finger-to-nose, normal rapid alternating movements and normal heel-to-shin test.     Lab Results: Results for orders placed during the hospital encounter of 10/09/11 (from the past 48 hour(s))  HIV 1 RNA QUANT-NO REFLEX-BLD      Status: Normal   Collection Time   10/12/11  2:13 PM      Component Value Range Comment   HIV 1 RNA Quant <20  <20 copies/mL    HIV1 RNA Quant, Log <1.30  <1.30 log 10   GLUCOSE, CAPILLARY     Status: Abnormal   Collection Time   10/13/11  8:25 AM      Component Value Range Comment   Glucose-Capillary 110 (*) 70 - 99 mg/dL    Comment 1 Documented in Chart      Comment 2 Notify RN     GRAM STAIN     Status: Normal   Collection Time   10/13/11  3:20 PM      Component Value Range Comment   Specimen Description CSF      Special Requests Normal      Gram Stain        Value: NO ORGANISMS SEEN     RARE WBC SEEN     N COUSAR AT 1805 ON 07.17.2013 BY NBROOKS   Report Status 10/13/2011 FINAL     PROTEIN AND GLUCOSE, CSF     Status: Abnormal   Collection Time   10/13/11  3:20 PM      Component Value Range Comment   Glucose, CSF 46  43 - 76 mg/dL    Total  Protein, CSF 73 (*) 15 - 45 mg/dL   CSF CELL COUNT WITH DIFFERENTIAL     Status: Abnormal   Collection Time   10/13/11  3:20 PM      Component Value Range Comment   Tube # 1      Color, CSF COLORLESS  COLORLESS    Appearance, CSF CLEAR (*) CLEAR    Supernatant NOT INDICATED      RBC Count, CSF 3 (*) 0 /cu mm    WBC, CSF 710 (*) 0 - 5 /cu mm    Segmented Neutrophils-CSF 0  0 - 6 %    Lymphs, CSF 97 (*) 40 - 80 %    Monocyte-Macrophage-Spinal Fluid 3 (*) 15 - 45 %    Eosinophils, CSF 0  0 - 1 %   CSF CULTURE     Status: Normal (Preliminary result)   Collection Time   10/13/11  3:20 PM      Component Value Range Comment   Specimen Description CSF      Special Requests Normal      Gram Stain        Value: NO WBC SEEN     NO ORGANISMS SEEN   Culture PENDING      Report Status PENDING     CRYPTOCOCCAL ANTIGEN, CSF     Status: Normal   Collection Time   10/13/11  3:20 PM      Component Value Range Comment   Crypto Ag NEGATIVE  NEGATIVE    Cryptococcal Ag Titer NOT INDICATED  NOT INDICATED   VANCOMYCIN, TROUGH     Status: Normal     Collection Time   10/13/11  9:40 PM      Component Value Range Comment   Vancomycin Tr 18.2  10.0 - 20.0 ug/mL   CBC WITH DIFFERENTIAL     Status: Normal   Collection Time   10/14/11  3:35 AM      Component Value Range Comment   WBC 8.2  4.0 - 10.5 K/uL    RBC 4.55  3.87 - 5.11 MIL/uL    Hemoglobin 13.7  12.0 - 15.0 g/dL    HCT 16.1  09.6 - 04.5 %    MCV 87.9  78.0 - 100.0 fL    MCH 30.1  26.0 - 34.0 pg    MCHC 34.3  30.0 - 36.0 g/dL    RDW 40.9  81.1 - 91.4 %    Platelets 214  150 - 400 K/uL    Neutrophils Relative 51  43 - 77 %    Neutro Abs 4.2  1.7 - 7.7 K/uL    Lymphocytes Relative 37  12 - 46 %    Lymphs Abs 3.0  0.7 - 4.0 K/uL    Monocytes Relative 7  3 - 12 %    Monocytes Absolute 0.6  0.1 - 1.0 K/uL    Eosinophils Relative 5  0 - 5 %    Eosinophils Absolute 0.4  0.0 - 0.7 K/uL    Basophils Relative 0  0 - 1 %    Basophils Absolute 0.0  0.0 - 0.1 K/uL   BASIC METABOLIC PANEL     Status: Normal   Collection Time   10/14/11  3:35 AM      Component Value Range Comment   Sodium 136  135 - 145 mEq/L    Potassium 3.9  3.5 - 5.1 mEq/L    Chloride 98  96 - 112 mEq/L  CO2 28  19 - 32 mEq/L    Glucose, Bld 94  70 - 99 mg/dL    BUN 7  6 - 23 mg/dL    Creatinine, Ser 2.13  0.50 - 1.10 mg/dL    Calcium 9.3  8.4 - 08.6 mg/dL    GFR calc non Af Amer >90  >90 mL/min    GFR calc Af Amer >90  >90 mL/min   MAGNESIUM     Status: Normal   Collection Time   10/14/11  3:35 AM      Component Value Range Comment   Magnesium 2.3  1.5 - 2.5 mg/dL   GLUCOSE, CAPILLARY     Status: Abnormal   Collection Time   10/14/11  8:03 AM      Component Value Range Comment   Glucose-Capillary 106 (*) 70 - 99 mg/dL    Comment 1 Documented in Chart      Comment 2 Notify RN      Lipid Panel No results found for this basename: CHOL,TRIG,HDL,CHOLHDL,VLDL,LDLCALC in the last 72 hours  Studies/Results: Dg Fluoro Guide Ndl Plc/bx  10/13/2011  *RADIOLOGY REPORT*  Clinical Data: Mental status change   FLUORO GUIDED NEEDLE PLACEMENT  Comparison: MR brain and CT brain of 10/10/2011  Findings: Using sterile technique and local anesthesia, a lumbar puncture was performed at the L3-4 level.  With the patient in the left lateral decubitus position, the opening pressure was measured at 18.5 cm of water.  As requested extra CSF was obtained for laboratory testing, with a total of 16.5 ml of clear CSF obtained and sent for the requested studies.  The patient tolerated the procedure well.  IMPRESSION: 16.5 ml of clear CSF obtained and sent for the requested laboratory studies.  Original Report Authenticated By: Juline Patch, M.D.    Medications:     Scheduled:   . acyclovir  10 mg/kg Intravenous Q8H  . levothyroxine  125 mcg Oral Q breakfast  . LORazepam  1 mg Oral BID  . pantoprazole  40 mg Oral Q1200  . sertraline  200 mg Oral QHS  . thiothixene  2 mg Oral QHS  . DISCONTD: ampicillin (OMNIPEN) IV  2 g Intravenous Q4H  . DISCONTD: cefTRIAXone (ROCEPHIN)  IV  2 g Intravenous Q12H  . DISCONTD: vancomycin  1,000 mg Intravenous Q8H    Assessment/Plan:    Patient Active Hospital Problem List:  Headache (10/10/2011)   Assessment: 56 YO female with 6 days history of HA and neck discomfort in setting of abnormal CSF showing markedly elevated WBC with lymphocytic predominance and high CSF protein. MRI and CT brain show no significant abnormality per radiology.  Repeat LP showed elevated WBC (710), elevated protein (73) and elevated lymphocytes (97) all which have decreased from previous LP.  Culture and fungal stain pending.  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     Plan: No further neurological work up recommended at this time.  ID following meningitis.  Neurology will S/O.   Felicie Morn PA-C [facility] [phone]  10/14/2011, 11:27 AM

## 2011-10-14 NOTE — Progress Notes (Signed)
INFECTIOUS DISEASE PROGRESS NOTE  ID: [name] is a 56 y.o. female with   Active Problems:  HYPOTHYROIDISM  HYPERTENSION  ASTHMA, EXTRINSIC NOS  Headache  Depression  Subjective: Feels upset with doctors that they are not telling her what is wrong with her. Still has headache  Abtx:  Anti-infectives     Start     Dose/Rate Route Frequency Ordered Stop   10/14/11 0915   acyclovir (ZOVIRAX) 925 mg in dextrose 5 % 150 mL IVPB        10 mg/kg  92.5 kg 168.5 mL/hr over 60 Minutes Intravenous 3 times per day 10/14/11 0912     10/12/11 1430   ampicillin (OMNIPEN) 2 g in sodium chloride 0.9 % 50 mL IVPB        2 g 150 mL/hr over 20 Minutes Intravenous 6 times per day 10/12/11 1318     10/12/11 1400   vancomycin (VANCOCIN) IVPB 1000 mg/200 mL premix        1,000 mg 200 mL/hr over 60 Minutes Intravenous Every 8 hours 10/12/11 0542     10/10/11 1800   cefTRIAXone (ROCEPHIN) 2 g in dextrose 5 % 50 mL IVPB        2 g 100 mL/hr over 30 Minutes Intravenous Every 12 hours 10/10/11 0825     10/10/11 1700   vancomycin (VANCOCIN) 1,250 mg in sodium chloride 0.9 % 250 mL IVPB  Status:  Discontinued        1,250 mg 166.7 mL/hr over 90 Minutes Intravenous Every 12 hours 10/10/11 0827 10/12/11 0542   10/10/11 0700   vancomycin (VANCOCIN) IVPB 1000 mg/200 mL premix        1,000 mg 200 mL/hr over 60 Minutes Intravenous To Emergency Dept 10/10/11 0636 10/10/11 0832   10/10/11 0700   cefTRIAXone (ROCEPHIN) 2 g in dextrose 5 % 50 mL IVPB        2 g 100 mL/hr over 30 Minutes Intravenous To Emergency Dept 10/10/11 0636 10/10/11 0731   10/10/11 0600   cefTRIAXone (ROCEPHIN) 1 g in dextrose 5 % 50 mL IVPB  Status:  Discontinued        1 g 100 mL/hr over 30 Minutes Intravenous  Once 10/10/11 0556 10/10/11 0642          Medications:  Scheduled:   . acyclovir  10 mg/kg Intravenous Q8H  . ampicillin (OMNIPEN) IV  2 g Intravenous Q4H  . cefTRIAXone (ROCEPHIN)  IV  2 g Intravenous Q12H   . levothyroxine  125 mcg Oral Q breakfast  . LORazepam  1 mg Oral BID  . pantoprazole  40 mg Oral Q1200  . sertraline  200 mg Oral QHS  . thiothixene  2 mg Oral QHS  . vancomycin  1,000 mg Intravenous Q8H    Objective: Vital signs in last 24 hours: Temp:  [97.8 F (36.6 C)-98.5 F (36.9 C)] 98.4 F (36.9 C) (07/18 0440) Pulse Rate:  [75-93] 77  (07/18 0440) Resp:  [16-20] 16  (07/18 0440) BP: (101-128)/(67-98) 101/67 mmHg (07/18 0440) SpO2:  [95 %-98 %] 96 % (07/18 0440) Weight:  [92.488 kg (203 lb 14.4 oz)] 92.488 kg (203 lb 14.4 oz) (07/18 0440)   General appearance: alert, cooperative and no distress Head: ovoid superficial wound on her L occiput from scratching.  Resp: clear to auscultation bilaterally Cardio: regular rate and rhythm GI: normal findings: bowel sounds normal and soft, non-tender  Lab Results  Basename 10/14/11 0335  WBC 8.2  HGB 13.7  HCT 40.0  NA 136  K 3.9  CL 98  CO2 28  BUN 7  CREATININE 0.67  GLU --   Liver Panel No results found for this basename: PROT:2,ALBUMIN:2,AST:2,ALT:2,ALKPHOS:2,BILITOT:2,BILIDIR:2,IBILI:2 in the last 72 hours Sedimentation Rate No results found for this basename: ESRSEDRATE in the last 72 hours C-Reactive Protein No results found for this basename: CRP:2 in the last 72 hours  Microbiology: Recent Results (from the past 240 hour(s))  GRAM STAIN     Status: Normal   Collection Time   10/10/11  5:14 AM      Component Value Range Status Comment   Specimen Description CSF   Final    Special Requests NONE   Final    Gram Stain     Final    Value: NO ORGANISMS SEEN WBC PRESENT, PREDOMINANTLY MONONUCLEAR     Gram Stain Report Called to,Read Back By and Verified With: HAYDEN R. BY Rodman Comp [id number] 10/10/11   Report Status 10/10/2011 FINAL   Final   CSF CULTURE     Status: Normal   Collection Time   10/10/11  5:15 AM      Component Value Range Status Comment   Specimen Description CSF   Final    Special Requests  NONE   Final    Gram Stain     Final    Value: WBC PRESENT, PREDOMINANTLY MONONUCLEAR     NO ORGANISMS SEEN     Gram Stain Report Called to,Read Back By and Verified With: Gram Stain Report Called to,Read Back By and Verified With: Rivka Spring BY Rodman Comp [id number] 10/10/11 Performed by [facility]   Culture NO GROWTH 3 DAYS   Final    Report Status 10/13/2011 FINAL   Final   MRSA PCR SCREENING     Status: Normal   Collection Time   10/11/11  2:27 PM      Component Value Range Status Comment   MRSA by PCR NEGATIVE  NEGATIVE Final   GRAM STAIN     Status: Normal   Collection Time   10/13/11  3:20 PM      Component Value Range Status Comment   Specimen Description CSF   Final    Special Requests Normal   Final    Gram Stain     Final    Value: NO ORGANISMS SEEN     RARE WBC SEEN     N COUSAR AT 1805 ON 07.17.2013 BY NBROOKS   Report Status 10/13/2011 FINAL   Final   CSF CULTURE     Status: Normal (Preliminary result)   Collection Time   10/13/11  3:20 PM      Component Value Range Status Comment   Specimen Description CSF   Final    Special Requests Normal   Final    Gram Stain     Final    Value: NO WBC SEEN     NO ORGANISMS SEEN   Culture PENDING   Incomplete    Report Status PENDING   Incomplete     Studies/Results: Dg Fluoro Guide Ndl Plc/bx  10/13/2011  *RADIOLOGY REPORT*  Clinical Data: Mental status change  FLUORO GUIDED NEEDLE PLACEMENT  Comparison: MR brain and CT brain of 10/10/2011  Findings: Using sterile technique and local anesthesia, a lumbar puncture was performed at the L3-4 level.  With the patient in the left lateral decubitus position, the opening pressure was measured at 18.5 cm of water.  As  requested extra CSF was obtained for laboratory testing, with a total of 16.5 ml of clear CSF obtained and sent for the requested studies.  The patient tolerated the procedure well.  IMPRESSION: 16.5 ml of clear CSF obtained and sent for the requested laboratory studies.   Original Report Authenticated By: Juline Patch, M.D.     Assessment/Plan: Headache  Lymphocytic Meningitis     Wide ddx (viral causes, drug (nsaids/bactrim), metastatic disease, fungal, afb)    She was taking NSAIDs daily prior to arrival? continue vancomycin/ceftriaxone/ampicillin Day 3  Await multiple studies from LP  Check CSF HSV PCR ( I have cancelled the antibody levels), [facility]ovirus [facility], HIV pcr, CSF cytology Agree with adding Acyclovir.  Send fungal Ab panel Send quantiferon gold  I sat with her and did the best I could to explain that she has lymphocytic meningitis, that we do not know what the cause is yet and that we still have multiple tests pending.   Johny Sax Infectious Diseases [phone] 10/14/2011, 10:56 AM   LOS: 5 days

## 2011-10-15 LAB — CBC
HCT: 37.9 % (ref 36.0–46.0)
Hemoglobin: 13.1 g/dL (ref 12.0–15.0)
MCH: 30.4 pg (ref 26.0–34.0)
MCHC: 34.6 g/dL (ref 30.0–36.0)
RBC: 4.31 MIL/uL (ref 3.87–5.11)

## 2011-10-15 LAB — GLUCOSE, CAPILLARY

## 2011-10-15 LAB — BASIC METABOLIC PANEL
BUN: 6 mg/dL (ref 6–23)
CO2: 26 mEq/L (ref 19–32)
CO2: 27 mEq/L (ref 19–32)
Calcium: 8.9 mg/dL (ref 8.4–10.5)
Chloride: 98 mEq/L (ref 96–112)
Creatinine, Ser: 0.66 mg/dL (ref 0.50–1.10)
GFR calc Af Amer: >90 mL/min (ref >90)
GFR calc non Af Amer: >90 mL/min (ref >90)
Glucose, Bld: 91 mg/dL (ref 70–99)
Potassium: 4 mEq/L (ref 3.5–5.1)
Potassium: 4 mEq/L (ref 3.5–5.1)

## 2011-10-15 LAB — MAGNESIUM: Magnesium: 2.4 mg/dL (ref 1.5–2.5)

## 2011-10-15 MED ORDER — LORAZEPAM 1 MG PO TABS: 1.0000 mg | ORAL_TABLET | Freq: Two times a day (BID) | ORAL | Status: DC

## 2011-10-15 MED ORDER — HYDROMORPHONE HCL PF 1 MG/ML IJ SOLN
0.5000 mg | Freq: Four times a day (QID) | INTRAMUSCULAR | Status: DC | PRN
Start: 2011-10-15 — End: 2011-10-16
  Administered 2011-10-15: 0.5 mg via INTRAVENOUS
  Filled 2011-10-15: qty 1

## 2011-10-15 MED ORDER — HYDROCODONE-ACETAMINOPHEN 5-325 MG PO TABS: 1.0000 | ORAL_TABLET | Freq: Four times a day (QID) | ORAL | Status: AC | PRN

## 2011-10-15 MED ORDER — VALACYCLOVIR HCL 1 G PO TABS: 1000.0000 mg | ORAL_TABLET | Freq: Two times a day (BID) | ORAL | Status: DC

## 2011-10-15 MED ORDER — THIOTHIXENE 2 MG PO CAPS: 2.0000 mg | ORAL_CAPSULE | Freq: Every day | ORAL | Status: DC

## 2011-10-15 MED ORDER — PROMETHAZINE HCL 25 MG/ML IJ SOLN
12.5000 mg | Freq: Once | INTRAMUSCULAR | Status: DC
  Filled 2011-10-15: qty 1

## 2011-10-15 MED ORDER — KETOROLAC TROMETHAMINE 15 MG/ML IJ SOLN: 15.0000 mg | Freq: Once | INTRAMUSCULAR | Status: DC

## 2011-10-15 MED ORDER — DIPHENHYDRAMINE HCL 50 MG/ML IJ SOLN: 25.0000 mg | Freq: Three times a day (TID) | INTRAMUSCULAR | Status: DC | PRN

## 2011-10-15 MED ORDER — DIPHENHYDRAMINE HCL 50 MG/ML IJ SOLN
12.5000 mg | Freq: Two times a day (BID) | INTRAMUSCULAR | Status: DC
  Filled 2011-10-15: qty 1

## 2011-10-15 MED ORDER — SERTRALINE HCL 100 MG PO TABS: 200.0000 mg | ORAL_TABLET | Freq: Every day | ORAL | Status: DC

## 2011-10-15 MED ORDER — KETOROLAC TROMETHAMINE 15 MG/ML IJ SOLN
15.0000 mg | Freq: Once | INTRAMUSCULAR | Status: AC
  Administered 2011-10-15: 15 mg via INTRAVENOUS
  Filled 2011-10-15: qty 1

## 2011-10-15 MED ORDER — SODIUM CHLORIDE 0.9 % IV SOLN
INTRAVENOUS | Status: DC
  Administered 2011-10-15: 15:00:00 via INTRAVENOUS

## 2011-10-15 MED ORDER — VALACYCLOVIR HCL 500 MG PO TABS
1000.0000 mg | ORAL_TABLET | Freq: Two times a day (BID) | ORAL | Status: DC
  Filled 2011-10-15: qty 2

## 2011-10-15 NOTE — Discharge Summary (Addendum)
[facility]                                                                                   [name], is a 56 y.o. female  DOB 1955/05/04  MRN [id number].  Admission date:  10/09/2011  Discharge Date:  10/15/2011  Primary MD  Dorrene German, MD  Admitting Physician  Alison Murray, MD  Admission Diagnosis  Extrinsic asthma, unspecified [493.00] Headache [784.0] Meningitis [322.9] Endometrial ca [182.0] UTI (lower urinary tract infection) [599.0] PAIN, HEADACHE  Discharge Diagnosis     Active Problems:  HYPOTHYROIDISM  HYPERTENSION  ASTHMA, EXTRINSIC NOS  Headache  Depression     Past Medical History  Diagnosis Date  . Sleep apnea   . Hypertension   . Thyroid disease   . Arthritis   . Asthma   . Depression   . Anxiety   . Fibromyalgia   . Lumbar spondylolysis   . Allergy   . Endometrial cancer   . Spinal stenosis     Past Surgical History  Procedure Date  . Cesarean section     Recommendations for primary care physician for things to follow:   Please follow final CSF culture results, CSF Cytology  and HSV serology    Discharge Diagnoses:   Active Problems:  HYPOTHYROIDISM  HYPERTENSION  ASTHMA, EXTRINSIC NOS  Headache  Depression    Discharge Condition: Stable   Diet recommendation: See Discharge Instructions below   Consults ID, Neurology   Major procedures - he is review detailed and final reports for all details in brief -   LP for CSF analysis, repeat LPon 10/13/2011 - lymphocytic pleocytosis  MRI Brain - no acute changes  EEG -unremarkable      History of present illness and  Hospital Course:  See H&P, Labs, Consult and Test reports for all details in brief, patient was admitted for Headache due to Aseptic meningitis, status post Stable LPs x 2 showing Lymphocytic pleocytosis, CT-MRI-EEG stable was seen by ID and Neuro and cleared for discharge with close outpt follow up. Patient was to be  discharged in the am but at 8.30pm after hours got called by night team that she wished immediate discharge, night MD graciously DC'd her.    She will follow with ID and Neuro, will request PCP to please follow final culture results and HSV serology, she is being discharged on PO valtrex for now. Had D/W ID Dr Ninetta Lights and Neuro Dr Jobe Gibbon today.    There is some concern with Narcotic seeking behavior as she insisted on getting IV dilaudid for headaches and nothing else would do despite appearing in no distress, she was told that IV Narcotics will be stopped tomorrow am.      Today   Subjective:   [name] today has no headache,no chest abdominal pain,no new weakness tingling or numbness, at 8.30 pm expressed her immediate desire to be discharged.  Objective:   Blood pressure 112/80, pulse 87, temperature 98.1 F (36.7 C), temperature source Oral, resp. rate 16, height 4\' 11"  (1.499 m), weight 92.488 kg (203 lb 14.4 oz), SpO2 96.00%.   Intake/Output Summary (Last 24 hours) at  10/15/11 2050 Last data filed at 10/15/11 1559  Gross per 24 hour  Intake    840 ml  Output      0 ml  Net    840 ml    Exam Awake Alert, Oriented *3, No new F.N deficits, Normal affect [place].AT,PERRAL Supple Neck,No JVD, No cervical lymphadenopathy appriciated.  Symmetrical Chest wall movement, Good air movement bilaterally, CTAB RRR,No Gallops,Rubs or new Murmurs, No Parasternal Heave +ve B.Sounds, Abd Soft, Non tender, No organomegaly appriciated, No rebound -guarding or rigidity. No Cyanosis, Clubbing or edema, No new Rash or bruise  Data Review      Radiology Reports - please see full reports for all details Ct Head Wo Contrast  10/10/2011  *RADIOLOGY REPORT*  Clinical Data: Headache  CT HEAD WITHOUT CONTRAST  Technique:  Contiguous axial images were obtained from the base of the skull through the vertex without contrast.  Comparison: 07/16/2007  Findings: Subcortical white matter  hypodensity along the left frontal lobe is unchanged. Mild periventricular white matter hypodensities well.  This may reflect chronic microangiopathic change.  There is no evidence for acute hemorrhage, hydrocephalus, mass lesion, or abnormal extra-axial fluid collection.  No definite CT evidence for acute infarction.  The visualized paranasal sinuses and mastoid air cells are predominately clear.  IMPRESSION: Mild white matter changes as above.  No definite acute intracranial abnormality.  Original Report Authenticated By: Waneta Martins, M.D.   Mr Laqueta Jean ZO Contrast  10/10/2011  *RADIOLOGY REPORT*  Clinical Data: Headache.  Fever.  Lumbar puncture suggests meningitis.  MRI HEAD WITHOUT AND WITH CONTRAST  Technique:  Multiplanar, multiecho pulse sequences of the brain and surrounding structures were obtained according to standard protocol without and with intravenous contrast  Contrast: 20mL MULTIHANCE GADOBENATE DIMEGLUMINE 529 MG/ML IV SOLN  Comparison: CT head 10/10/2011.  Findings: There is no evidence for acute infarction, intracranial hemorrhage, mass lesion, hydrocephalus, or extra-axial fluid. Relatively minor foci of white matter signal abnormality are nonspecific.  There is no atrophy or focal cortical infarct.  No areas of chronic hemorrhage are seen.  Post infusion, there is no definite abnormal enhancement of the brain or meninges.  No parenchymal enhancement is observed.  Calvarium intact.  Clear sinuses and mastoids.  IMPRESSION: Minor foci of white matter signal abnormality.  No acute stroke or bleed.  No abnormal post contrast enhancement of the meninges.  This does not exclude viral meningitis.   No parenchymal enhancement is observed.  Original Report Authenticated By: Elsie Stain, M.D.   Dg Fluoro Guide Ndl Plc/bx  10/13/2011  *RADIOLOGY REPORT*  Clinical Data: Mental status change  FLUORO GUIDED NEEDLE PLACEMENT  Comparison: MR brain and CT brain of 10/10/2011  Findings: Using  sterile technique and local anesthesia, a lumbar puncture was performed at the L3-4 level.  With the patient in the left lateral decubitus position, the opening pressure was measured at 18.5 cm of water.  As requested extra CSF was obtained for laboratory testing, with a total of 16.5 ml of clear CSF obtained and sent for the requested studies.  The patient tolerated the procedure well.  IMPRESSION: 16.5 ml of clear CSF obtained and sent for the requested laboratory studies.  Original Report Authenticated By: Juline Patch, M.D.    Micro Results  Recent Results (from the past 240 hour(s))  GRAM STAIN     Status: Normal   Collection Time   10/10/11  5:14 AM      Component Value Range Status  Comment   Specimen Description CSF   Final    Special Requests NONE   Final    Gram Stain     Final    Value: NO ORGANISMS SEEN WBC PRESENT, PREDOMINANTLY MONONUCLEAR     Gram Stain Report Called to,Read Back By and Verified With: Rivka Spring BY Rodman Comp 1610 10/10/11   Report Status 10/10/2011 FINAL   Final   CSF CULTURE     Status: Normal   Collection Time   10/10/11  5:15 AM      Component Value Range Status Comment   Specimen Description CSF   Final    Special Requests NONE   Final    Gram Stain     Final    Value: WBC PRESENT, PREDOMINANTLY MONONUCLEAR     NO ORGANISMS SEEN     Gram Stain Report Called to,Read Back By and Verified With: Gram Stain Report Called to,Read Back By and Verified With: Rivka Spring BY Rodman Comp [id number] 10/10/11 Performed by [facility]   Culture NO GROWTH 3 DAYS   Final    Report Status 10/13/2011 FINAL   Final   MRSA PCR SCREENING     Status: Normal   Collection Time   10/11/11  2:27 PM      Component Value Range Status Comment   MRSA by PCR NEGATIVE  NEGATIVE Final   GRAM STAIN     Status: Normal   Collection Time   10/13/11  3:20 PM      Component Value Range Status Comment   Specimen Description CSF   Final    Special Requests Normal   Final    Gram Stain      Final    Value: NO ORGANISMS SEEN     RARE WBC SEEN     N COUSAR AT 1805 ON 07.17.2013 BY NBROOKS   Report Status 10/13/2011 FINAL   Final   FUNGAL STAIN     Status: Normal   Collection Time   10/13/11  3:20 PM      Component Value Range Status Comment   Specimen Description CSF   Final    Special Requests Normal   Final    Fungal Smear NO YEAST OR FUNGAL ELEMENTS SEEN   Final    Report Status 10/14/2011 FINAL   Final   CSF CULTURE     Status: Normal (Preliminary result)   Collection Time   10/13/11  3:20 PM      Component Value Range Status Comment   Specimen Description CSF   Final    Special Requests Normal   Final    Gram Stain     Final    Value: NO WBC SEEN     NO ORGANISMS SEEN   Culture NO GROWTH 2 DAYS   Final    Report Status PENDING   Incomplete      CBC w Diff: Lab Results  Component Value Date   WBC 7.7 10/15/2011   HGB 13.1 10/15/2011   HCT 37.9 10/15/2011   PLT 190 10/15/2011   LYMPHOPCT 37 10/14/2011   MONOPCT 7 10/14/2011   EOSPCT 5 10/14/2011   BASOPCT 0 10/14/2011    CMP: Lab Results  Component Value Date   NA 135 10/15/2011   K 4.0 10/15/2011   CL 98 10/15/2011   CO2 27 10/15/2011   BUN 7 10/15/2011   CREATININE 0.66 10/15/2011   PROT 6.5 10/11/2011   ALBUMIN 3.5 10/11/2011   BILITOT  0.2* 10/11/2011   ALKPHOS 47 10/11/2011   AST 26 10/11/2011   ALT 20 10/11/2011  .   Discharge Instructions     Follow with Primary MD Dorrene German, MD in 3 days.   Please follow final  CSF culture results including HSV serology and CSF cytology with your Prim MD and ID MD Dr Ninetta Lights.  Get CBC, CMP, checked 3 days by Primary MD and again as instructed by your Primary MD.   Get Medicines reviewed and adjusted.  Please request your Prim.MD to go over all Hospital Tests and Procedure/Radiological results at the follow up, please get all Hospital records sent to your Prim MD by signing hospital release before you go home.  Activity: As tolerated with Full fall  precautions use walker/cane & assistance as needed  Diet:  Heart Healthy  For Heart failure patients - Check your Weight same time everyday, if you gain over 2 pounds, or you develop in leg swelling, experience more shortness of breath or chest pain, call your Primary MD immediately. Follow Cardiac Low Salt Diet and 1.8 lit/day fluid restriction.  Disposition Home   If you experience worsening of your admission symptoms, develop shortness of breath, life threatening emergency, suicidal or homicidal thoughts you must seek medical attention immediately by calling 911 or calling your MD immediately  if symptoms less severe.   You Must read complete instructions/literature along with all the possible adverse reactions/side effects for all the Medicines you take and that have been prescribed to you. Take any new Medicines after you have completely understood and accpet all the possible adverse reactions/side effects.    Do not drive if your were admitted for syncope or siezures until you have seen by Primary MD or a Neurologist and advised to drive.   Do not drive when taking Pain medications.    Do not take more than prescribed Pain, Sleep and Anxiety Medications.   Special Instructions: If you have smoked or chewed Tobacco  in the last 2 yrs please stop smoking, stop any regular Alcohol  and or any Recreational drug use.   Wear Seat belts while driving.   Follow-up Information    Follow up with AVBUERE,EDWIN A, MD. Schedule an appointment as soon as possible for a visit in 3 days. (For final culture review.)    Contact information:   [address] [place] [address] [phone]       Follow up with Johny Sax, MD. Schedule an appointment as soon as possible for a visit in 1 week.   Contact information:   [address]  [address] [place] [address] [phone]       Follow up with Williams Che, MD. Schedule  an appointment as soon as possible for a visit in 4 days.   Contact information:   [address] [facility] [place] [address]            Discharge Medications   Medication List  As of 10/15/2011  8:51 PM   START taking these medications         HYDROcodone-acetaminophen 5-325 MG per tablet   Commonly known as: NORCO/VICODIN   Take 1 tablet by mouth every 6 (six) hours as needed for pain.      valACYclovir 1000 MG tablet   Commonly known as: VALTREX   Take 1 tablet (1,000 mg total) by mouth 2 (two) times daily.  CONTINUE taking these medications         levothyroxine 125 MCG tablet   Commonly known as: SYNTHROID, LEVOTHROID      LORazepam 1 MG tablet   Commonly known as: ATIVAN      sertraline 100 MG tablet   Commonly known as: ZOLOFT      thiothixene 2 MG capsule   Commonly known as: NAVANE         STOP taking these medications         ibuprofen 200 MG tablet          Where to get your medications    These are the prescriptions that you need to pick up.   You may get these medications from any pharmacy.         HYDROcodone-acetaminophen 5-325 MG per tablet   valACYclovir 1000 MG tablet             Total Time in preparing paper work, data evaluation and todays exam - 35 minutes  Leroy Sea M.D on 10/15/2011 at 8:50 PM  [facility] Office  [phone]

## 2011-10-15 NOTE — Progress Notes (Signed)
PT Cancellation Note  Treatment cancelled today due to patient's refusal to participate. Will follow.   Ralene Bathe Kistler 10/15/2011, 2:24 PM [phone]

## 2011-10-15 NOTE — Progress Notes (Signed)
Dr. Betti Cruz was  notified that the patient is requesting to be discharged to night. New orders put in by Dr Betti Cruz for discharge tonight and discharge medication prescriptions given and reviewed with patient

## 2011-10-15 NOTE — Progress Notes (Addendum)
INFECTIOUS DISEASE PROGRESS NOTE  ID: [name] is a 56 y.o. female with   Active Problems:  HYPOTHYROIDISM  HYPERTENSION  ASTHMA, EXTRINSIC NOS  Headache  Depression  Subjective: Currently with multiple concerns about her hospital care, concerns about pain medication.  Abtx:  Anti-infectives     Start     Dose/Rate Route Frequency Ordered Stop   10/15/11 2200   valACYclovir (VALTREX) tablet 1,000 mg        1,000 mg Oral 2 times daily 10/15/11 1233     10/14/11 2000   acyclovir (ZOVIRAX) 925 mg in dextrose 5 % 150 mL IVPB  Status:  Discontinued        10 mg/kg  92.5 kg 168.5 mL/hr over 60 Minutes Intravenous Every 8 hours 10/14/11 1337 10/15/11 1233   10/14/11 0915   acyclovir (ZOVIRAX) 925 mg in dextrose 5 % 150 mL IVPB  Status:  Discontinued        10 mg/kg  92.5 kg 168.5 mL/hr over 60 Minutes Intravenous 3 times per day 10/14/11 0912 10/14/11 1337   10/12/11 1430   ampicillin (OMNIPEN) 2 g in sodium chloride 0.9 % 50 mL IVPB  Status:  Discontinued        2 g 150 mL/hr over 20 Minutes Intravenous 6 times per day 10/12/11 1318 10/14/11 1126   10/12/11 1400   vancomycin (VANCOCIN) IVPB 1000 mg/200 mL premix  Status:  Discontinued        1,000 mg 200 mL/hr over 60 Minutes Intravenous Every 8 hours 10/12/11 0542 10/14/11 1126   10/10/11 1800   cefTRIAXone (ROCEPHIN) 2 g in dextrose 5 % 50 mL IVPB  Status:  Discontinued        2 g 100 mL/hr over 30 Minutes Intravenous Every 12 hours 10/10/11 0825 10/14/11 1126   10/10/11 1700   vancomycin (VANCOCIN) 1,250 mg in sodium chloride 0.9 % 250 mL IVPB  Status:  Discontinued        1,250 mg 166.7 mL/hr over 90 Minutes Intravenous Every 12 hours 10/10/11 0827 10/12/11 0542   10/10/11 0700   vancomycin (VANCOCIN) IVPB 1000 mg/200 mL premix        1,000 mg 200 mL/hr over 60 Minutes Intravenous To Emergency Dept 10/10/11 0636 10/10/11 0832   10/10/11 0700   cefTRIAXone (ROCEPHIN) 2 g in dextrose 5 % 50 mL IVPB        2  g 100 mL/hr over 30 Minutes Intravenous To Emergency Dept 10/10/11 0636 10/10/11 0731   10/10/11 0600   cefTRIAXone (ROCEPHIN) 1 g in dextrose 5 % 50 mL IVPB  Status:  Discontinued        1 g 100 mL/hr over 30 Minutes Intravenous  Once 10/10/11 0556 10/10/11 0642          Medications:  Scheduled:   . diphenhydrAMINE  12.5 mg Intravenous BID  . ketorolac  15 mg Intravenous Once  . levothyroxine  125 mcg Oral Q breakfast  . LORazepam  1 mg Oral BID  . pantoprazole  40 mg Oral Q1200  . promethazine  12.5 mg Intravenous Once  . sertraline  200 mg Oral QHS  . thiothixene  2 mg Oral QHS  . valACYclovir  1,000 mg Oral BID  . DISCONTD: acyclovir  10 mg/kg Intravenous Q8H    Objective: Vital signs in last 24 hours: Temp:  [98 F (36.7 C)-99.5 F (37.5 C)] 98 F (36.7 C) (07/19 0432) Pulse Rate:  [77-88] 81  (07/19 0432)  Resp:  [16-18] 16  (07/19 0432) BP: (113-130)/(73-84) 130/84 mmHg (07/19 0432) SpO2:  [94 %-96 %] 94 % (07/19 0432)   General appearance: alert, mild distress and she becomes tearful when talking about her pain medications. She is upset about her hospital stay  Lab Results  [facility] 10/15/11 0400 10/14/11 0335  WBC 7.7 8.2  HGB 13.1 13.7  HCT 37.9 40.0  NA 138 136  K 4.0 3.9  CL 101 98  CO2 26 28  BUN 6 7  CREATININE 0.67 0.67  GLU -- --   Liver Panel No results found for this basename: PROT:2,ALBUMIN:2,AST:2,ALT:2,ALKPHOS:2,BILITOT:2,BILIDIR:2,IBILI:2 in the last 72 hours Sedimentation Rate No results found for this basename: ESRSEDRATE in the last 72 hours C-Reactive Protein No results found for this basename: CRP:2 in the last 72 hours  Microbiology: Recent Results (from the past 240 hour(s))  GRAM STAIN     Status: Normal   Collection Time   10/10/11  5:14 AM      Component Value Range Status Comment   Specimen Description CSF   Final    Special Requests NONE   Final    Gram Stain     Final    Value: NO ORGANISMS SEEN WBC PRESENT,  PREDOMINANTLY MONONUCLEAR     Gram Stain Report Called to,Read Back By and Verified With: HAYDEN R. BY Rodman Comp 1478 10/10/11   Report Status 10/10/2011 FINAL   Final   CSF CULTURE     Status: Normal   Collection Time   10/10/11  5:15 AM      Component Value Range Status Comment   Specimen Description CSF   Final    Special Requests NONE   Final    Gram Stain     Final    Value: WBC PRESENT, PREDOMINANTLY MONONUCLEAR     NO ORGANISMS SEEN     Gram Stain Report Called to,Read Back By and Verified With: Gram Stain Report Called to,Read Back By and Verified With: Rivka Spring BY Rodman Comp [id number] 10/10/11 Performed by [facility]   Culture NO GROWTH 3 DAYS   Final    Report Status 10/13/2011 FINAL   Final   MRSA PCR SCREENING     Status: Normal   Collection Time   10/11/11  2:27 PM      Component Value Range Status Comment   MRSA by PCR NEGATIVE  NEGATIVE Final   GRAM STAIN     Status: Normal   Collection Time   10/13/11  3:20 PM      Component Value Range Status Comment   Specimen Description CSF   Final    Special Requests Normal   Final    Gram Stain     Final    Value: NO ORGANISMS SEEN     RARE WBC SEEN     N COUSAR AT 1805 ON 07.17.2013 BY NBROOKS   Report Status 10/13/2011 FINAL   Final   FUNGAL STAIN     Status: Normal   Collection Time   10/13/11  3:20 PM      Component Value Range Status Comment   Specimen Description CSF   Final    Special Requests Normal   Final    Fungal Smear NO YEAST OR FUNGAL ELEMENTS SEEN   Final    Report Status 10/14/2011 FINAL   Final   CSF CULTURE     Status: Normal (Preliminary result)   Collection Time   10/13/11  3:20 PM  Component Value Range Status Comment   Specimen Description CSF   Final    Special Requests Normal   Final    Gram Stain     Final    Value: NO WBC SEEN     NO ORGANISMS SEEN   Culture NO GROWTH 2 DAYS   Final    Report Status PENDING   Incomplete     Studies/Results: Dg Fluoro Guide Ndl  Plc/bx  10/13/2011  *RADIOLOGY REPORT*  Clinical Data: Mental status change  FLUORO GUIDED NEEDLE PLACEMENT  Comparison: MR brain and CT brain of 10/10/2011  Findings: Using sterile technique and local anesthesia, a lumbar puncture was performed at the L3-4 level.  With the patient in the left lateral decubitus position, the opening pressure was measured at 18.5 cm of water.  As requested extra CSF was obtained for laboratory testing, with a total of 16.5 ml of clear CSF obtained and sent for the requested studies.  The patient tolerated the procedure well.  IMPRESSION: 16.5 ml of clear CSF obtained and sent for the requested laboratory studies.  Original Report Authenticated By: Juline Patch, M.D.     Assessment/Plan: Aseptic Meningitis CSF Cx (-) x2. Both done on anbx. Awaiting HSV pcr. Agree with plan for d/c on po valtex.  Await arbovirus studies.  I spent over 10 minutes explaining her care to her.  F/u with PCP.   Johny Sax Infectious Diseases [phone] 10/15/2011, 2:16 PM   LOS: 6 days

## 2011-10-15 NOTE — Procedures (Signed)
REFERRING PHYSICIAN:  Dr. Thedore Mins.  HISTORY:  A 56 year old female with a headache and palpitations.  MEDICATIONS:  Synthroid, Ativan, Zoloft, Navane, Dilaudid, Protonix, Rocephin.  CONDITIONS OF RECORDING:  This is a 16-channel EEG carried out with the patient in the awake and drowsy states.  DESCRIPTION:  The waking background activity consists of a low-voltage symmetrical, fairly well-organized 9 Hz alpha activity seen from the parieto-occipital and posterotemporal regions.  Low-voltage, fast activity, poorly organized was seen and during at times, superimposed on more posterior rhythms.  A mixture of theta and alpha rhythm was seen from the central and temporal regions.  The patient drowses with slowing to irregular, low-voltage theta and beta activity.  Stage II sleep was not obtained.  Hyperventilation was performed and elicited a moderate buildup, but failed to elicit any abnormalities.  Intermittent photic stimulation was performed and elicited a symmetrical driving response, but failed to elicit any other changes in the tracing.  IMPRESSION:  This is a normal EEG.          ______________________________ Thana Farr, MD    IO:NGEX D:  10/15/2011 07:54:48  T:  10/15/2011 08:21:33  Job #:  [id number]

## 2011-10-15 NOTE — Progress Notes (Signed)
[facility]                                                                                Patient Demographics  [name], is a 56 y.o. female  RUE:[id number]  JYN:[id number]  DOB - 07/02/1955  Admit date - 10/09/2011  Admitting Physician Alison Murray, MD  Outpatient Primary MD for the patient is Dorrene German, MD  LOS - 6   Chief Complaint  Patient presents with  . Headache  . Fever        Assessment & Plan   1. Headache    - At this point given abnormal CSF findings he have consulted ID and Neurology to help weigh in on further testing and treatment options. Discussed infectious disease Dr. Ninetta Lights on 10/14/2011 unlikely that this is a bacterial infection noting that too CSF studies from 2 different LPs, stop all antibiotics, more suspicious for viral infection . Neurology has signed off, discussed the case with Dr. Ninetta Lights infectious disease again on 10/15/2011, wants to switch her to by mouth Valtrex okay for discharge from ID and stand point, patient requests one more day of stay. EEG negative.    2. Hypothyroidism  - Stable continue synthroid at home dose. Last TSH 3.0    3. Asthma  - Stable currently    4. Depression   - Continue sertraline     Code Status: Full    Family Communication: Spoke with mother at bedside    Disposition Plan: Pending further work up and clinical improvement in condition.    Brief narrative:  Patient is a 56 y/o CF with h/o endometrial cancer (per patient stage I) hypothyroidism, and depression with multiple visits recently to the ED for headaches. She was admitted for further workup and CT and MRI were obtained which were fairly unremarkable reportedly. CSF evaluation was found to have abnormal findings and as such, neurology and ID following.    Consultants:  ID  Neurology   Procedures:  LP for CSF analysis, repeat LPon 10/13/2011 - lymphocytic pleocytosis MRI Brain - no acute  changes EEG -unremarkable   Antibiotics:  Ampicillin, ceftriaxone, Vancomycin   Time Spent 35   DVT Prophylaxis  SCDs     Susa Raring K M.D on 10/15/2011 at 12:40 PM  Between 7am to 7pm - Pager - [phone]  After 7pm go to [email or web address] - password [email or web address]  And look for the night coverage person covering for me after hours  [facility] Office  [phone]    Subjective:   Liberty Seto today has, and has generalized headaches, No chest pain, No abdominal pain - No Nausea, No new weakness tingling or numbness, No Cough - SOB.   Objective:   Filed Vitals:   10/14/11 0440 10/14/11 1437 10/14/11 2155 10/15/11 0432  BP: 101/67 115/74 113/73 130/84  Pulse: 77 88 77 81  Temp: 98.4 F (36.9 C) 99.5 F (37.5 C) 98.2 F (36.8 C) 98 F (36.7 C)  TempSrc: Oral Oral Oral Oral  Resp: 16 18 18 16   Height:      Weight: 92.488 kg (203 lb 14.4 oz)     SpO2: 96% 96% 94% 94%  Wt Readings from Last 3 Encounters:  10/14/11 92.488 kg (203 lb 14.4 oz)  09/20/11 91.173 kg (201 lb)  09/09/11 86.183 kg (190 lb)     Intake/Output Summary (Last 24 hours) at 10/15/11 1240 Last data filed at 10/14/11 2130  Gross per 24 hour  Intake    240 ml  Output      0 ml  Net    240 ml    Exam Awake Alert, Oriented X 3, No new F.N deficits, Normal affect Felt.AT,PERRAL Supple Neck,No JVD, No cervical lymphadenopathy appriciated.  Symmetrical Chest wall movement, Good air movement bilaterally, CTAB RRR,No Gallops,Rubs or new Murmurs, No Parasternal Heave +ve B.Sounds, Abd Soft, Non tender, No organomegaly appriciated, No rebound - guarding or rigidity. No Cyanosis, Clubbing or edema, No new Rash or bruise      Data Review   CBC  Lab 10/15/11 0400 10/14/11 0335 10/11/11 0337 10/10/11 1123 10/09/11 2143  WBC 7.7 8.2 9.2 8.2 10.1  HGB 13.1 13.7 12.9 14.5 15.7*  HCT 37.9 40.0 38.0 41.0 43.7  PLT 190 214 200 178 225  MCV 87.9 87.9 88.8 87.6 86.2  MCH 30.4 30.1 30.1  31.0 31.0  MCHC 34.6 34.3 33.9 35.4 35.9  RDW 13.0 13.0 13.0 13.1 13.0  LYMPHSABS -- 3.0 -- 2.0 --  MONOABS -- 0.6 -- 0.2 --  EOSABS -- 0.4 -- 0.1 --  BASOSABS -- 0.0 -- 0.0 --  BANDABS -- -- -- -- --    Chemistries   Lab 10/15/11 0400 10/14/11 0335 10/11/11 0337 10/10/11 1123 10/09/11 2143  NA 138 136 139 135 136  K 4.0 3.9 3.8 4.4 4.1  CL 101 98 104 99 98  CO2 26 28 25 25 25   GLUCOSE 91 94 118* 133* 98  BUN 6 7 9 10 9   CREATININE 0.67 0.67 0.59 0.63 0.69  CALCIUM 8.9 9.3 8.7 9.0 9.9  MG 2.2 2.3 -- 2.1 --  AST -- -- 26 23 27   ALT -- -- 20 21 26   ALKPHOS -- -- 47 52 60  BILITOT -- -- 0.2* 0.3 0.5   ------------------------------------------------------------------------------------------------------------------ estimated creatinine clearance is 77 ml/min (by C-G formula based on Cr of 0.67). ------------------------------------------------------------------------------------------------------------------ No results found for this basename: HGBA1C:2 in the last 72 hours ------------------------------------------------------------------------------------------------------------------ No results found for this basename: CHOL:2,HDL:2,LDLCALC:2,TRIG:2,CHOLHDL:2,LDLDIRECT:2 in the last 72 hours ------------------------------------------------------------------------------------------------------------------ No results found for this basename: TSH,T4TOTAL,FREET3,T3FREE,THYROIDAB in the last 72 hours ------------------------------------------------------------------------------------------------------------------ No results found for this basename: VITAMINB12:2,FOLATE:2,FERRITIN:2,TIBC:2,IRON:2,RETICCTPCT:2 in the last 72 hours  Coagulation profile  Lab 10/10/11 1123  INR 1.00  PROTIME --    No results found for this basename: DDIMER:2 in the last 72 hours  Cardiac Enzymes No results found for this basename: CK:3,CKMB:3,TROPONINI:3,MYOGLOBIN:3 in the last 168  hours ------------------------------------------------------------------------------------------------------------------ No components found with this basename: POCBNP:3  Micro Results Recent Results (from the past 240 hour(s))  GRAM STAIN     Status: Normal   Collection Time   10/10/11  5:14 AM      Component Value Range Status Comment   Specimen Description CSF   Final    Special Requests NONE   Final    Gram Stain     Final    Value: NO ORGANISMS SEEN WBC PRESENT, PREDOMINANTLY MONONUCLEAR     Gram Stain Report Called to,Read Back By and Verified With: Rennie Natter S. [id number] 10/10/11   Report Status 10/10/2011 FINAL   Final   CSF CULTURE     Status: Normal  Collection Time   10/10/11  5:15 AM      Component Value Range Status Comment   Specimen Description CSF   Final    Special Requests NONE   Final    Gram Stain     Final    Value: WBC PRESENT, PREDOMINANTLY MONONUCLEAR     NO ORGANISMS SEEN     Gram Stain Report Called to,Read Back By and Verified With: Gram Stain Report Called to,Read Back By and Verified With: Rivka Spring BY Rodman Comp 1914 10/10/11 Performed by [facility]   Culture NO GROWTH 3 DAYS   Final    Report Status 10/13/2011 FINAL   Final   MRSA PCR SCREENING     Status: Normal   Collection Time   10/11/11  2:27 PM      Component Value Range Status Comment   MRSA by PCR NEGATIVE  NEGATIVE Final   GRAM STAIN     Status: Normal   Collection Time   10/13/11  3:20 PM      Component Value Range Status Comment   Specimen Description CSF   Final    Special Requests Normal   Final    Gram Stain     Final    Value: NO ORGANISMS SEEN     RARE WBC SEEN     N COUSAR AT 1805 ON 07.17.2013 BY NBROOKS   Report Status 10/13/2011 FINAL   Final   FUNGAL STAIN     Status: Normal   Collection Time   10/13/11  3:20 PM      Component Value Range Status Comment   Specimen Description CSF   Final    Special Requests Normal   Final    Fungal Smear NO YEAST OR  FUNGAL ELEMENTS SEEN   Final    Report Status 10/14/2011 FINAL   Final   CSF CULTURE     Status: Normal (Preliminary result)   Collection Time   10/13/11  3:20 PM      Component Value Range Status Comment   Specimen Description CSF   Final    Special Requests Normal   Final    Gram Stain     Final    Value: NO WBC SEEN     NO ORGANISMS SEEN   Culture NO GROWTH 2 DAYS   Final    Report Status PENDING   Incomplete     Radiology Reports Ct Head Wo Contrast  10/10/2011  *RADIOLOGY REPORT*  Clinical Data: Headache  CT HEAD WITHOUT CONTRAST  Technique:  Contiguous axial images were obtained from the base of the skull through the vertex without contrast.  Comparison: 07/16/2007  Findings: Subcortical white matter hypodensity along the left frontal lobe is unchanged. Mild periventricular white matter hypodensities well.  This may reflect chronic microangiopathic change.  There is no evidence for acute hemorrhage, hydrocephalus, mass lesion, or abnormal extra-axial fluid collection.  No definite CT evidence for acute infarction.  The visualized paranasal sinuses and mastoid air cells are predominately clear.  IMPRESSION: Mild white matter changes as above.  No definite acute intracranial abnormality.  Original Report Authenticated By: Waneta Martins, M.D.   Mr Laqueta Jean NW Contrast  10/10/2011  *RADIOLOGY REPORT*  Clinical Data: Headache.  Fever.  Lumbar puncture suggests meningitis.  MRI HEAD WITHOUT AND WITH CONTRAST  Technique:  Multiplanar, multiecho pulse sequences of the brain and surrounding structures were obtained according to standard protocol without and with intravenous contrast  Contrast: 20mL  MULTIHANCE GADOBENATE DIMEGLUMINE 529 MG/ML IV SOLN  Comparison: CT head 10/10/2011.  Findings: There is no evidence for acute infarction, intracranial hemorrhage, mass lesion, hydrocephalus, or extra-axial fluid. Relatively minor foci of white matter signal abnormality are nonspecific.  There is no  atrophy or focal cortical infarct.  No areas of chronic hemorrhage are seen.  Post infusion, there is no definite abnormal enhancement of the brain or meninges.  No parenchymal enhancement is observed.  Calvarium intact.  Clear sinuses and mastoids.  IMPRESSION: Minor foci of white matter signal abnormality.  No acute stroke or bleed.  No abnormal post contrast enhancement of the meninges.  This does not exclude viral meningitis.   No parenchymal enhancement is observed.  Original Report Authenticated By: Elsie Stain, M.D.    Scheduled Meds:    . diphenhydrAMINE  12.5 mg Intravenous BID  . ketorolac  15 mg Intravenous Once  . levothyroxine  125 mcg Oral Q breakfast  . LORazepam  1 mg Oral BID  . pantoprazole  40 mg Oral Q1200  . promethazine  12.5 mg Intravenous Once  . sertraline  200 mg Oral QHS  . thiothixene  2 mg Oral QHS  . valACYclovir  1,000 mg Oral BID  . DISCONTD: acyclovir  10 mg/kg Intravenous Q8H  . DISCONTD: acyclovir  10 mg/kg Intravenous Q8H   Continuous Infusions:    . sodium chloride    . DISCONTD: sodium chloride 75 mL/hr at 10/14/11 0546   PRN Meds:.diphenhydrAMINE, HYDROmorphone (DILAUDID) injection, DISCONTD: diphenhydrAMINE, DISCONTD:  HYDROmorphone (DILAUDID) injection

## 2011-10-15 NOTE — Progress Notes (Signed)
UR done. 

## 2011-10-15 NOTE — Progress Notes (Signed)
Pt states 10-10 Pain. Pt states "Can you bring medication in after I eat." Pain is what the pt states that it is, but pt appears to be in no acute distress. Called Dr. Thedore Mins and one time order of toradol ordered.

## 2011-10-16 LAB — T PALLIDUM IGG, CSF: T pallidum IgG, CSF: NONREACTIVE

## 2011-10-16 LAB — CSF CULTURE W GRAM STAIN
Culture: NO GROWTH
Gram Stain: NONE SEEN
Special Requests: NORMAL

## 2011-10-19 LAB — QUANTIFERON TB GOLD ASSAY (BLOOD): Interferon Gamma Release Assay: POSITIVE — AB

## 2011-10-19 LAB — FUNGAL ANTIBODIES PANEL, ID-BLOOD
Aspergillus Niger Antibodies: NEGATIVE
Aspergillus fumigatus: NEGATIVE
Blastomyces Abs, Qn, DID: NEGATIVE
Histoplasma Ab, Immunodiffusion: NEGATIVE

## 2011-10-20 LAB — ARBOVIRUS IGG, CSF
Lacrosse virus encephalitis, IgG: <1:2 {titer}
St Louis encephalitis, IgG: <1:2 {titer}

## 2011-10-27 ENCOUNTER — Telehealth: Payer: Self-pay | Admitting: Cardiovascular Disease

## 2011-10-27 NOTE — Telephone Encounter (Signed)
Will return call tomorrow when office open

## 2011-10-27 NOTE — Telephone Encounter (Signed)
pt needs surgical clearance pt having total hystorectomy/pt has cancer and needs to have surgery /Dr. Misty Stanley [place]/office# [phone]  fax# [phone]

## 2011-10-28 ENCOUNTER — Telehealth: Payer: Self-pay | Admitting: Gynecologic Oncology

## 2011-10-28 NOTE — Telephone Encounter (Signed)
Spoke with the patient about contacting Dr. Antionette Char and about upcoming events.  Pt stating that she is going to her heart doctor on Tuesday to go over the recent tests and to obtain cardiac clearance for possible surgery on 8/27.  Stating that she is planning on contacting Dr. Antionette Char after her visit on Tuesday.  Informed that she has an appointment with Dr. Duard Brady on November 17, 2011 at 2pm for possible surgery on 11/23/11.  Pt verbalizing understanding and the importance of keeping her appointments.  Instructed to call for any questions or concerns.

## 2011-10-28 NOTE — Telephone Encounter (Signed)
Spoke with Scarlette Calico at Dr. Kathi Der office and pt is scheduled for surgery on November 23, 2011. Pt has appt with Tereso Newcomer, PA on November 02, 2011 and it will be determined at that time if pt may proceed with surgery.

## 2011-11-01 ENCOUNTER — Telehealth: Payer: Self-pay | Admitting: Infectious Diseases

## 2011-11-01 ENCOUNTER — Telehealth: Payer: Self-pay | Admitting: Licensed Clinical Social Worker

## 2011-11-01 NOTE — Telephone Encounter (Signed)
Spoke with pt's mom- her state arbovirus panel was (-). Her serum quantiferon (TB screening test was +). I have asked them to call health dept for f/u appt ( i have discussed this with health dept as well). [phone]

## 2011-11-01 NOTE — Telephone Encounter (Signed)
Dr. Ninetta Lights

## 2011-11-02 ENCOUNTER — Ambulatory Visit: Payer: Medicaid Other | Admitting: Physician Assistant

## 2011-11-03 ENCOUNTER — Telehealth: Payer: Self-pay | Admitting: Infectious Diseases

## 2011-11-03 NOTE — Telephone Encounter (Signed)
Explained to pt about her screening test + for TB. She has f/u appt at health dept. She continues to complain of pain. She is awaiting surgery for her "cancer".  will f/u at with PMD.

## 2011-11-05 ENCOUNTER — Telehealth (HOSPITAL_COMMUNITY): Payer: Self-pay | Admitting: *Deleted

## 2011-11-05 NOTE — Telephone Encounter (Signed)
Tc to pt to inform her synthroid 125 mcg refill  rx called to her pahrmacy

## 2011-11-08 ENCOUNTER — Ambulatory Visit: Payer: Medicaid Other | Admitting: Physician Assistant

## 2011-11-08 ENCOUNTER — Telehealth: Payer: Self-pay | Admitting: *Deleted

## 2011-11-08 NOTE — Telephone Encounter (Signed)
Tc from pt stating she could not make it to cardiologist appt. It has been rescheduled for 11/11/11.  She is strongly encouraged to keep this appt, her appt next week with Dr Duard Brady in prep for surgery 11/23/11. States she is fatigued and again advised that we cannot take care of this over the phone and she is encouraged to keep all appts we have made for her.

## 2011-11-10 ENCOUNTER — Encounter: Payer: Self-pay | Admitting: Cardiovascular Disease

## 2011-11-10 ENCOUNTER — Telehealth: Payer: Self-pay | Admitting: *Deleted

## 2011-11-10 NOTE — Telephone Encounter (Signed)
Pt has cancelled follow up appts with Dr. Clifton James and Tereso Newcomer, PA.  I called pt to check on her and see how she was feeling. Mother states pt is sleeping and does not want to awaken her.  I asked mother to have pt call me back prior to 11:00 today so I could review with Dr. Clifton James when he is in the office today. Mother states she will have pt call.

## 2011-11-10 NOTE — Telephone Encounter (Signed)
Completed letter to surgeon discussing clearance for surgery. See letter in epic. cdm

## 2011-11-10 NOTE — Telephone Encounter (Signed)
Letter faxed to Dr. Kathi Der office. [phone]

## 2011-11-10 NOTE — Telephone Encounter (Signed)
Pt does have appt with Tereso Newcomer, PA scheduled for November 11, 2011 and is aware of this appt.  I spoke with pt who reports she does not feel heart skipping as much as before.  States she has pain on left side of chest at times but it lasts only a short time and goes away on own.  She is not having shortness of breath. Her main complaint is fatigue and no energy. Also complaining of vaginal bleeding at times.  She states she is scheduled for upcoming surgery for cancer.  I told pt I would review with Dr. Clifton James regarding appt tomorrow and surgical clearance

## 2011-11-10 NOTE — Telephone Encounter (Signed)
Spoke with pt and told her that Dr. Clifton James had reviewed chart and completed letter to MD clearing her for upcoming surgery and that she did not need to keep appt scheduled with Tereso Newcomer, PA for November 11, 2011.  She will contact us again if needed for heart related issues.  I told her letter would be faxed to Dr. Tamela Oddi.

## 2011-11-11 ENCOUNTER — Ambulatory Visit: Payer: Medicaid Other | Admitting: Physician Assistant

## 2011-11-15 ENCOUNTER — Encounter (HOSPITAL_COMMUNITY): Payer: Self-pay | Admitting: Pharmacy Technician

## 2011-11-17 ENCOUNTER — Encounter: Payer: Self-pay | Admitting: Gynecologic Oncology

## 2011-11-17 ENCOUNTER — Ambulatory Visit: Payer: Medicaid Other | Attending: Gynecologic Oncology | Admitting: Gynecologic Oncology

## 2011-11-17 VITALS — BP 106/78 | HR 68 | Temp 98.5°F | Resp 18 | Ht <= 58 in | Wt 201.5 lb

## 2011-11-17 DIAGNOSIS — G4730 Sleep apnea, unspecified: Secondary | ICD-10-CM | POA: Insufficient documentation

## 2011-11-17 DIAGNOSIS — C549 Malignant neoplasm of corpus uteri, unspecified: Secondary | ICD-10-CM | POA: Insufficient documentation

## 2011-11-17 DIAGNOSIS — I10 Essential (primary) hypertension: Secondary | ICD-10-CM | POA: Insufficient documentation

## 2011-11-17 DIAGNOSIS — C541 Malignant neoplasm of endometrium: Secondary | ICD-10-CM

## 2011-11-17 DIAGNOSIS — Z79899 Other long term (current) drug therapy: Secondary | ICD-10-CM | POA: Insufficient documentation

## 2011-11-17 DIAGNOSIS — IMO0001 Reserved for inherently not codable concepts without codable children: Secondary | ICD-10-CM | POA: Insufficient documentation

## 2011-11-17 NOTE — Progress Notes (Signed)
H&P  [name] 56 y.o. female  CC:  Chief Complaint  Patient presents with  . Endometrial adenocarcinoma    Follow up  Should is a 56 year old gravida 1 para 1 has been bleeding for greater than one year's time. I last saw her 4/13. She was seen by Dr. Tamela Oddi and had an endometrial biopsy performed revealed a grade 1 endometrial carcinoma. It is for this reason she is referred to [name] today. It is very difficult to get a history from the patient as she is very tangential. It sounds like she went through menopause at the age of 22 she took hormone replacement therapy for about a month's time.  She had been bleeding for greater than 9 months. She states that she was scared and never went to see a physician for this. She was is complaining of abdominal pain. She states that 3 years ago she was supposed to have a cholecystectomy. Early she was scared and never went out for this. She states she does have known as fatty liver.   Interval History:  She been seen seen by Dr. Clifton James  for preop risk stratification. She had a Holter done that showed normal spot sinus rhythm rare PACs and one PVC. She also had an echocardiogram that was unremarkable. She unfortunately had missed multiple appointments with the cardiologist on over the 6 weeks to discuss the results. She has been "cleared for surgery and we gave recommendations to proceed with surgery. She was also seen by Dr. Thedore Mins when she was admitted to the hospital. She was admitted with headaches questionable meningitis. She had a lumbar tap done that was essentially unremarkable. Her post discharge diagnoses include hypothyroidism hypertension asthma headaches and depression. She had a CT MRI and EEG that were stable. She was seen by infectious disease and neurology and was cleared for discharge and followup. She comes in today to reestablish yourself as we have not seen her since April in for discussion of surgery.  Review of Systems: She  denies any vaginal bleeding.She occasionally has some chest pains but she's unsure of the chest pain is related to the abdominal pain that she has or not. She states she does have sleep apnea she stopped using her CPAP machine. She is very tired during the day she so tired she states "she can't take a bath". She complains of back pain a right-sided abdominal pain. She states that she is spinal stenosis both of her lumbar spine as well as her C-spine osteoarthritis and bulging discs. Change in her bowel or bladder habits any unintentional weight loss weight gain.    Past Medical Hx:  Past Medical History  Diagnosis Date  . Sleep apnea   . Hypertension   . Thyroid disease   . Arthritis   . Asthma   . Depression   . Anxiety   . Fibromyalgia   . Lumbar spondylolysis   . Allergy   . Endometrial cancer   . Spinal stenosis    Past Surgical Hx:  Past Surgical History  Procedure Date  . Cesarean section    Current Meds:  Outpatient Encounter Prescriptions as of 11/17/2011  Medication Sig Dispense Refill  . B Complex Vitamins (VITAMIN B COMPLEX) TABS Take 1 tablet by mouth daily.      . Coenzyme Q10 (CO Q 10) 60 MG CAPS Take 1 tablet by mouth daily.      . Lecithin 1200 MG CAPS Take 1 capsule by mouth daily.      Marland Kitchen  levothyroxine (SYNTHROID, LEVOTHROID) 125 MCG tablet Take 125 mcg by mouth daily before breakfast.       . LORazepam (ATIVAN) 1 MG tablet Take 1-2 mg by mouth 2 (two) times daily.      . Selenium 200 MCG TABS Take 1 tablet by mouth daily.      . sertraline (ZOLOFT) 100 MG tablet Take 200 mg by mouth at bedtime.      Marland Kitchen thiothixene (NAVANE) 2 MG capsule Take 2 mg by mouth at bedtime.      . vitamin C (ASCORBIC ACID) 500 MG tablet Take 500 mg by mouth 2 (two) times daily.      . vitamin E (VITAMIN E) 400 UNIT capsule Take 800 Units by mouth daily.      . Zinc 22.5 MG TABS Take 1 tablet by mouth daily.       Allergy:  Allergies  Allergen Reactions  . Sulfamethoxazole  W-Trimethoprim Other (See Comments)    Unknown reaction   Social Hx:   History   Social History  . Marital Status: Single    Spouse Name: N/A    Number of Children: N/A  . Years of Education: N/A   Occupational History  . Not on file.   Social History Main Topics  . Smoking status: Never Smoker   . Smokeless tobacco: Never Used  . Alcohol Use: No  . Drug Use: No  . Sexually Active: Not on file   Other Topics Concern  . Not on file   Social History Narrative  . No narrative on file   Family Hx:  Family History  Problem Relation Age of Onset  . Hypertension Mother   . Coronary artery disease Father   . Cancer Brother     Renal cell carcinoma  . Hypertension Other   . Cancer Other   . Heart attack Father   . Coronary artery disease Brother     Pertinent Gynecological History:  ROS: Vitals:  Blood pressure 106/78, pulse 68, temperature 98.5 F (36.9 C), temperature source Oral, resp. rate 18, height 4' 9.48" (1.46 m), weight 201 lb 8 oz (91.4 kg).  Physical Exam: Well-developed but poorly kept female in no acute distress.  Neck: No lymphadenopathy no thyromegaly.  Lungs: Auscultation bilaterally.  Cardiovascular regular rate rhythm without murmur. No tachycardia no palpitations noted.  Abdomen: Mildly obese. Soft nontender nondistended. There no palpable mass of the exam is limited by habitus.+ yeast in the lower abdomen.  Groins: Candida infection in the left intertriginous.  Extremities: 1+ nonpitting edema.  Pelvic: Normal external female genitalia. Very narrow pubic arch. Bimanual examination is limited by habitus is no nodularity.   Assessment/Plan:  Extremities gravida 1 para 1 with history of grade 1 endometrial carcinoma clinical stage one. I discussed with her proceeding with robotic-assisted hysterectomy BSO. The uterus be sent for frozen section and lipase lymphadenectomy based on this. She understands that we may need to proceed with an abdominal  incision secondary to visualization due to her obesity and narrow pubic arch. We also discussed that she should be using her CPAP machine as untreated sleep apnea carries with it significant medical complications. We appreciate our consultants efforts with pre-operative care. She was encouraged to shower prior to surgery to decrease infectious morbidity and to use her surgical prep (hibiclens shower) as directed the morning of surgery.   Risks and benefits of surgery were discussed with the patient and she very much wishes to proceed.  Assessment/Plan:   [id number]  A., MD 11/17/2011, 2:39 PM

## 2011-11-17 NOTE — Patient Instructions (Signed)
Return for pre-operative visit

## 2011-11-19 ENCOUNTER — Encounter (HOSPITAL_COMMUNITY): Payer: Self-pay

## 2011-11-19 ENCOUNTER — Encounter (HOSPITAL_COMMUNITY)
Admission: RE | Admit: 2011-11-19 | Discharge: 2011-11-19 | Disposition: A | Discharge status: Home / Self Care | Payer: Medicaid Other | Source: Ambulatory Visit | Attending: Obstetrics & Gynecology | Admitting: Obstetrics & Gynecology

## 2011-11-19 ENCOUNTER — Ambulatory Visit (HOSPITAL_COMMUNITY)
Admission: RE | Admit: 2011-11-19 | Discharge: 2011-11-19 | Disposition: A | Discharge status: Home / Self Care | Payer: Medicaid Other | Source: Ambulatory Visit | Attending: Gynecologic Oncology | Admitting: Gynecologic Oncology

## 2011-11-19 DIAGNOSIS — Z01810 Encounter for preprocedural cardiovascular examination: Secondary | ICD-10-CM | POA: Insufficient documentation

## 2011-11-19 DIAGNOSIS — Z01812 Encounter for preprocedural laboratory examination: Secondary | ICD-10-CM | POA: Insufficient documentation

## 2011-11-19 DIAGNOSIS — C549 Malignant neoplasm of corpus uteri, unspecified: Secondary | ICD-10-CM | POA: Insufficient documentation

## 2011-11-19 HISTORY — DX: Cardiac arrhythmia, unspecified: I49.9

## 2011-11-19 LAB — CBC WITH DIFFERENTIAL/PLATELET
Eosinophils Absolute: 0.3 10*3/uL (ref 0.0–0.7)
Eosinophils Relative: 4 % (ref 0–5)
Hemoglobin: 14.6 g/dL (ref 12.0–15.0)
Lymphs Abs: 3.2 10*3/uL (ref 0.7–4.0)
MCH: 30.9 pg (ref 26.0–34.0)
MCV: 88.3 fL (ref 78.0–100.0)
Monocytes Absolute: 0.4 10*3/uL (ref 0.1–1.0)
Monocytes Relative: 6 % (ref 3–12)
RBC: 4.72 MIL/uL (ref 3.87–5.11)

## 2011-11-19 LAB — COMPREHENSIVE METABOLIC PANEL
Alkaline Phosphatase: 57 U/L (ref 39–117)
BUN: 10 mg/dL (ref 6–23)
Calcium: 9.9 mg/dL (ref 8.4–10.5)
Creatinine, Ser: 0.62 mg/dL (ref 0.50–1.10)
GFR calc Af Amer: >90 mL/min (ref >90)
Glucose, Bld: 98 mg/dL (ref 70–99)
Total Protein: 7.7 g/dL (ref 6.0–8.3)

## 2011-11-19 LAB — URINALYSIS, ROUTINE W REFLEX MICROSCOPIC
Glucose, UA: NEGATIVE mg/dL
Ketones, ur: NEGATIVE mg/dL
Protein, ur: NEGATIVE mg/dL

## 2011-11-19 LAB — URINE MICROSCOPIC-ADD ON

## 2011-11-19 NOTE — Patient Instructions (Signed)
YOUR SURGERY IS SCHEDULED ON:  Tuesday  8/27  AT 1:30 PM  REPORT TO [facility] AT:  11:00 AM      PHONE # FOR SHORT STAY IS [phone] REMEMBER TO ONLY DRINK CLEAR LIQUIDS DAY BEFORE YOUR SURGERY--PER DR. Denman George INSTRUCTIONS.  DO NOT EAT OR DRINK ANYTHING AFTER MIDNIGHT THE NIGHT BEFORE YOUR SURGERY.  YOU MAY BRUSH YOUR TEETH, RINSE OUT YOUR MOUTH--BUT NO WATER, NO FOOD, NO CHEWING GUM, NO MINTS, NO CANDIES, NO CHEWING TOBACCO.  PLEASE TAKE THE FOLLOWING MEDICATIONS THE AM OF YOUR SURGERY WITH A FEW SIPS OF WATER:  SYNTHROID AND LORAZEPAM    IF YOU USE INHALERS--USE YOUR INHALERS THE AM OF YOUR SURGERY AND BRING INHALERS TO THE HOSPITAL -TAKE TO SURGERY.    IF YOU ARE DIABETIC:  DO NOT TAKE ANY DIABETIC MEDICATIONS THE AM OF YOUR SURGERY.  IF YOU TAKE INSULIN IN THE EVENINGS--PLEASE ONLY TAKE 1/2 NORMAL EVENING DOSE THE NIGHT BEFORE YOUR SURGERY.  NO INSULIN THE AM OF YOUR SURGERY.  IF YOU HAVE SLEEP APNEA AND USE CPAP OR BIPAP--PLEASE BRING THE MASK --NOT THE MACHINE-NOT THE TUBING   -JUST THE MASK. DO NOT BRING VALUABLES, MONEY, CREDIT CARDS.  CONTACT LENS, DENTURES / PARTIALS, GLASSES SHOULD NOT BE WORN TO SURGERY AND IN MOST CASES-HEARING AIDS WILL NEED TO BE REMOVED.  BRING YOUR GLASSES CASE, ANY EQUIPMENT NEEDED FOR YOUR CONTACT LENS. FOR PATIENTS ADMITTED TO THE HOSPITAL--CHECK OUT TIME THE DAY OF DISCHARGE IS 11:00 AM.  ALL INPATIENT ROOMS ARE PRIVATE - WITH BATHROOM, TELEPHONE, TELEVISION AND WIFI INTERNET. IF YOU ARE BEING DISCHARGED THE SAME DAY OF YOUR SURGERY--YOU CAN NOT DRIVE YOURSELF HOME--AND SHOULD NOT GO HOME ALONE BY TAXI OR BUS.  NO DRIVING OR OPERATING MACHINERY FOR 24 HOURS FOLLOWING ANESTHESIA / PAIN MEDICATIONS.                            SPECIAL INSTRUCTIONS:  CHLORHEXIDINE SOAP SHOWER (other brand names are Betasept and Hibiclens ) PLEASE SHOWER WITH CHLORHEXIDINE THE NIGHT BEFORE YOUR SURGERY AND THE AM OF YOUR SURGERY. DO NOT USE  CHLORHEXIDINE ON YOUR FACE OR PRIVATE AREAS--YOU MAY USE YOUR NORMAL SOAP THOSE AREAS AND YOUR NORMAL SHAMPOO.  WOMEN SHOULD AVOID SHAVING UNDER ARMS AND SHAVING LEGS 48 HOURS BEFORE USING CHLORHEXIDINE TO AVOID SKIN IRRITATION.  DO NOT USE IF ALLERGIC TO CHLORHEXIDINE.  PLEASE READ OVER ANY  FACT SHEETS THAT YOU WERE GIVEN: MRSA INFORMATION, BLOOD TRANSFUSION INFORMATION, INCENTIVE SPIROMETER INFORMATION.  REMEMBER TO DO DEEP BREATHE, COUGH, AND WHEN LYING IN BED-TURN FREQUENTLY, MOVE YOUR LEGS UP AND DOWN.

## 2011-11-19 NOTE — Pre-Procedure Instructions (Signed)
PREOP CBC, DIFF, CMET, UA, T/S, CXR WERE DONE TODAY AT [facility] AS PER GUIDELINES OF ANESTHESIOLOGIST AND DR. GEHRIG.  PT WAS C/O OF POSSIBLE UTI TODAY SO URINE SPEC COLLECTED FOR URINALYSIS.  PT HAS EKG REPORT 08/22/11 AND CARDIOLOGY OFFICE NOTE DR. MCALHANY AND ECHO REPORT 10/01/11 -ALL IN EPIC. LETTER ON PT'S CHART FROM DR. MCALHANY GIVING CARDIAC CLEARANCE FOR PT'S PLANNED HYSTERECTOMY SURGERY. PT SIGNED CONSENT FOR SURGERY, SIGNED HYSTERECTOMY STATEMENT FOR MEDICAID, AND SIGNED CONSENT FOR BLOOD. PREOP INSTRUCTIONS DISCUSSED WITH PT USING TEACH BACK METHOD.  PT WAS INSTRUCTED TO BRING HER CPAP MASK AND TUBING-STATES SHE WILL ALSO HAVE TO BRING HER MACHINE BECAUSE SHE CAN'T DISCONNECT TUBING.  PT WAS REMINDED THAT SHE IS TO BE ON CLEAR LIQUID DIET DAY BEFORE HER SURGERY--SHE SAID SHE DID NOT KNOW ABOUT THAT.  I GAVE HER A COPY OF CLEAR LIQUID DIET AND REVIEWED WITH HER THE DIET--TO BE DONE DAY BEFORE HER SURGERY.  PT INSTRUCTED SHE IS NOT TO HAVE ANYTHING TO EAT OR DRINK THE DAY OF HER SURGERY--JUST A FEW SIPS OF WATER DAY OF SURGERY TO TAKE HER SYNTHROID AND ATIVAN.  PT HAS WRITTEN COPY OF ALL PREOP INSTRUCTIONS.

## 2011-11-23 ENCOUNTER — Encounter (HOSPITAL_COMMUNITY): Payer: Self-pay | Admitting: Gynecologic Oncology

## 2011-11-23 ENCOUNTER — Encounter (HOSPITAL_COMMUNITY): Payer: Self-pay | Admitting: Certified Registered Nurse Anesthetist

## 2011-11-23 ENCOUNTER — Ambulatory Visit (HOSPITAL_COMMUNITY): Payer: Medicaid Other | Admitting: Certified Registered Nurse Anesthetist

## 2011-11-23 ENCOUNTER — Encounter (HOSPITAL_COMMUNITY): Payer: Self-pay | Admitting: *Deleted

## 2011-11-23 ENCOUNTER — Ambulatory Visit (HOSPITAL_COMMUNITY)
Admission: RE | Admit: 2011-11-23 | Discharge: 2011-11-24 | Disposition: A | Discharge status: Home / Self Care | Payer: Medicaid Other | Source: Ambulatory Visit | Attending: Obstetrics & Gynecology | Admitting: Obstetrics & Gynecology

## 2011-11-23 ENCOUNTER — Encounter (HOSPITAL_COMMUNITY)
Admission: RE | Disposition: A | Discharge status: Home / Self Care | Payer: Self-pay | Source: Ambulatory Visit | Attending: Obstetrics & Gynecology

## 2011-11-23 DIAGNOSIS — M25519 Pain in unspecified shoulder: Secondary | ICD-10-CM | POA: Insufficient documentation

## 2011-11-23 DIAGNOSIS — E669 Obesity, unspecified: Secondary | ICD-10-CM | POA: Insufficient documentation

## 2011-11-23 DIAGNOSIS — J45901 Unspecified asthma with (acute) exacerbation: Secondary | ICD-10-CM | POA: Insufficient documentation

## 2011-11-23 DIAGNOSIS — Z79899 Other long term (current) drug therapy: Secondary | ICD-10-CM | POA: Insufficient documentation

## 2011-11-23 DIAGNOSIS — F329 Major depressive disorder, single episode, unspecified: Secondary | ICD-10-CM | POA: Insufficient documentation

## 2011-11-23 DIAGNOSIS — M4800 Spinal stenosis, site unspecified: Secondary | ICD-10-CM | POA: Insufficient documentation

## 2011-11-23 DIAGNOSIS — M542 Cervicalgia: Secondary | ICD-10-CM | POA: Insufficient documentation

## 2011-11-23 DIAGNOSIS — G8918 Other acute postprocedural pain: Secondary | ICD-10-CM | POA: Insufficient documentation

## 2011-11-23 DIAGNOSIS — I10 Essential (primary) hypertension: Secondary | ICD-10-CM | POA: Insufficient documentation

## 2011-11-23 DIAGNOSIS — G4730 Sleep apnea, unspecified: Secondary | ICD-10-CM | POA: Insufficient documentation

## 2011-11-23 DIAGNOSIS — E039 Hypothyroidism, unspecified: Secondary | ICD-10-CM | POA: Insufficient documentation

## 2011-11-23 DIAGNOSIS — C549 Malignant neoplasm of corpus uteri, unspecified: Secondary | ICD-10-CM | POA: Insufficient documentation

## 2011-11-23 DIAGNOSIS — C541 Malignant neoplasm of endometrium: Secondary | ICD-10-CM | POA: Diagnosis present

## 2011-11-23 DIAGNOSIS — IMO0001 Reserved for inherently not codable concepts without codable children: Secondary | ICD-10-CM | POA: Insufficient documentation

## 2011-11-23 DIAGNOSIS — F3289 Other specified depressive episodes: Secondary | ICD-10-CM | POA: Insufficient documentation

## 2011-11-23 DIAGNOSIS — R109 Unspecified abdominal pain: Secondary | ICD-10-CM | POA: Insufficient documentation

## 2011-11-23 DIAGNOSIS — R51 Headache: Secondary | ICD-10-CM | POA: Insufficient documentation

## 2011-11-23 DIAGNOSIS — F411 Generalized anxiety disorder: Secondary | ICD-10-CM | POA: Insufficient documentation

## 2011-11-23 LAB — TYPE AND SCREEN: ABO/RH(D): A POS

## 2011-11-23 SURGERY — ROBOTIC ASSISTED TOTAL HYSTERECTOMY WITH BILATERAL SALPINGO OOPHORECTOMY
Anesthesia: General | Site: Abdomen | Wound class: Clean Contaminated

## 2011-11-23 MED ORDER — LACTATED RINGERS IV SOLN: INTRAVENOUS | Status: DC

## 2011-11-23 MED ORDER — LORAZEPAM 1 MG PO TABS
1.0000 mg | ORAL_TABLET | Freq: Two times a day (BID) | ORAL | Status: DC
  Administered 2011-11-23 – 2011-11-24 (×2): 1 mg via ORAL
  Filled 2011-11-23 (×2): qty 1

## 2011-11-23 MED ORDER — LACTATED RINGERS IV SOLN
INTRAVENOUS | Status: DC
  Administered 2011-11-23: 16:00:00 via INTRAVENOUS
  Administered 2011-11-23: 1000 mL via INTRAVENOUS

## 2011-11-23 MED ORDER — ROCURONIUM BROMIDE 100 MG/10ML IV SOLN
INTRAVENOUS | Status: DC | PRN
  Administered 2011-11-23: 10 mg via INTRAVENOUS
  Administered 2011-11-23: 40 mg via INTRAVENOUS

## 2011-11-23 MED ORDER — FENTANYL CITRATE 0.05 MG/ML IJ SOLN
INTRAMUSCULAR | Status: DC | PRN
  Administered 2011-11-23 (×3): 50 ug via INTRAVENOUS
  Administered 2011-11-23 (×2): 100 ug via INTRAVENOUS

## 2011-11-23 MED ORDER — HYDROMORPHONE HCL PF 1 MG/ML IJ SOLN
0.5000 mg | INTRAMUSCULAR | Status: DC | PRN
  Administered 2011-11-23 – 2011-11-24 (×2): 0.5 mg via INTRAVENOUS
  Filled 2011-11-23 (×2): qty 1

## 2011-11-23 MED ORDER — ACETAMINOPHEN 10 MG/ML IV SOLN
1000.0000 mg | Freq: Four times a day (QID) | INTRAVENOUS | Status: AC
  Administered 2011-11-23 – 2011-11-24 (×2): 1000 mg via INTRAVENOUS
  Filled 2011-11-23 (×3): qty 100

## 2011-11-23 MED ORDER — ONDANSETRON HCL 4 MG PO TABS
4.0000 mg | ORAL_TABLET | Freq: Four times a day (QID) | ORAL | Status: DC | PRN
  Filled 2011-11-23: qty 1

## 2011-11-23 MED ORDER — ENOXAPARIN SODIUM 40 MG/0.4ML ~~LOC~~ SOLN
40.0000 mg | SUBCUTANEOUS | Status: AC
  Administered 2011-11-23: 40 mg via SUBCUTANEOUS
  Filled 2011-11-23: qty 0.4

## 2011-11-23 MED ORDER — ACETAMINOPHEN 10 MG/ML IV SOLN
INTRAVENOUS | Status: DC | PRN
  Administered 2011-11-23: 1000 mg via INTRAVENOUS

## 2011-11-23 MED ORDER — DEXTROSE 5 % IV SOLN
2.0000 g | INTRAVENOUS | Status: AC
  Administered 2011-11-23: 2 g via INTRAVENOUS
  Filled 2011-11-23: qty 2

## 2011-11-23 MED ORDER — OXYCODONE-ACETAMINOPHEN 5-325 MG PO TABS
1.0000 | ORAL_TABLET | ORAL | Status: DC | PRN
  Administered 2011-11-23: 2 via ORAL
  Filled 2011-11-23: qty 2

## 2011-11-23 MED ORDER — GLYCOPYRROLATE 0.2 MG/ML IJ SOLN
INTRAMUSCULAR | Status: DC | PRN
  Administered 2011-11-23: 0.6 mg via INTRAVENOUS

## 2011-11-23 MED ORDER — ACETAMINOPHEN 10 MG/ML IV SOLN
INTRAVENOUS | Status: AC
  Filled 2011-11-23: qty 100

## 2011-11-23 MED ORDER — SUCCINYLCHOLINE CHLORIDE 20 MG/ML IJ SOLN
INTRAMUSCULAR | Status: DC | PRN
  Administered 2011-11-23: 100 mg via INTRAVENOUS

## 2011-11-23 MED ORDER — KCL IN DEXTROSE-NACL 20-5-0.45 MEQ/L-%-% IV SOLN
INTRAVENOUS | Status: DC
  Administered 2011-11-23: 23:00:00 via INTRAVENOUS
  Filled 2011-11-23 (×4): qty 1000

## 2011-11-23 MED ORDER — LIDOCAINE HCL (CARDIAC) 20 MG/ML IV SOLN
INTRAVENOUS | Status: DC | PRN
  Administered 2011-11-23: 70 mg via INTRAVENOUS

## 2011-11-23 MED ORDER — HYDROCHLOROTHIAZIDE 25 MG PO TABS: 12.5000 mg | ORAL_TABLET | ORAL | Status: DC

## 2011-11-23 MED ORDER — ONDANSETRON HCL 4 MG/2ML IJ SOLN
INTRAMUSCULAR | Status: DC | PRN
  Administered 2011-11-23: 4 mg via INTRAVENOUS

## 2011-11-23 MED ORDER — HYDROMORPHONE HCL PF 1 MG/ML IJ SOLN
INTRAMUSCULAR | Status: DC | PRN
  Administered 2011-11-23 (×2): 1 mg via INTRAVENOUS

## 2011-11-23 MED ORDER — PROMETHAZINE HCL 25 MG/ML IJ SOLN: 6.2500 mg | INTRAMUSCULAR | Status: DC | PRN

## 2011-11-23 MED ORDER — DEXAMETHASONE SODIUM PHOSPHATE 10 MG/ML IJ SOLN
INTRAMUSCULAR | Status: DC | PRN
  Administered 2011-11-23: 10 mg via INTRAVENOUS

## 2011-11-23 MED ORDER — MIDAZOLAM HCL 5 MG/5ML IJ SOLN
INTRAMUSCULAR | Status: DC | PRN
  Administered 2011-11-23: 0.5 mg via INTRAVENOUS
  Administered 2011-11-23: 2 mg via INTRAVENOUS
  Administered 2011-11-23: 0.5 mg via INTRAVENOUS

## 2011-11-23 MED ORDER — ONDANSETRON HCL 4 MG/2ML IJ SOLN: 4.0000 mg | Freq: Four times a day (QID) | INTRAMUSCULAR | Status: DC | PRN

## 2011-11-23 MED ORDER — THIOTHIXENE 2 MG PO CAPS
2.0000 mg | ORAL_CAPSULE | Freq: Every day | ORAL | Status: DC
  Administered 2011-11-23: 2 mg via ORAL
  Filled 2011-11-23 (×3): qty 1

## 2011-11-23 MED ORDER — HYDROCHLOROTHIAZIDE 12.5 MG PO CAPS
12.5000 mg | ORAL_CAPSULE | ORAL | Status: DC
  Administered 2011-11-24: 12.5 mg via ORAL
  Filled 2011-11-23 (×3): qty 1

## 2011-11-23 MED ORDER — NEOSTIGMINE METHYLSULFATE 1 MG/ML IJ SOLN
INTRAMUSCULAR | Status: DC | PRN
  Administered 2011-11-23: 5 mg via INTRAVENOUS

## 2011-11-23 MED ORDER — ESMOLOL HCL 10 MG/ML IV SOLN
INTRAVENOUS | Status: DC | PRN
  Administered 2011-11-23 (×2): 20 mg via INTRAVENOUS
  Administered 2011-11-23: 30 mg via INTRAVENOUS

## 2011-11-23 MED ORDER — PROPOFOL 10 MG/ML IV EMUL
INTRAVENOUS | Status: DC | PRN
  Administered 2011-11-23: 150 mg via INTRAVENOUS

## 2011-11-23 MED ORDER — SERTRALINE HCL 100 MG PO TABS
200.0000 mg | ORAL_TABLET | Freq: Every day | ORAL | Status: DC
  Filled 2011-11-23 (×3): qty 2

## 2011-11-23 MED ORDER — LACTATED RINGERS IV SOLN
INTRAVENOUS | Status: DC | PRN
  Administered 2011-11-23: 1000 mL

## 2011-11-23 MED ORDER — HYDROMORPHONE HCL PF 1 MG/ML IJ SOLN
0.2500 mg | INTRAMUSCULAR | Status: DC | PRN
  Administered 2011-11-23 (×2): 0.5 mg via INTRAVENOUS

## 2011-11-23 MED ORDER — HYDROMORPHONE HCL PF 1 MG/ML IJ SOLN
INTRAMUSCULAR | Status: AC
  Filled 2011-11-23: qty 1

## 2011-11-23 SURGICAL SUPPLY — 48 items
BENZOIN TINCTURE PRP APPL 2/3 (GAUZE/BANDAGES/DRESSINGS) ×2 IMPLANT
CHLORAPREP W/TINT 26ML (MISCELLANEOUS) ×4 IMPLANT
CLOTH BEACON ORANGE TIMEOUT ST (SAFETY) ×2 IMPLANT
CORD HIGH FREQUENCY UNIPOLAR (ELECTROSURGICAL) ×2 IMPLANT
CORDS BIPOLAR (ELECTRODE) ×2 IMPLANT
COVER MAYO STAND STRL (DRAPES) ×2 IMPLANT
COVER SURGICAL LIGHT HANDLE (MISCELLANEOUS) ×2 IMPLANT
COVER TIP SHEARS 8 DVNC (MISCELLANEOUS) ×1 IMPLANT
COVER TIP SHEARS 8MM DA VINCI (MISCELLANEOUS) ×1
DECANTER SPIKE VIAL GLASS SM (MISCELLANEOUS) IMPLANT
DRAPE LG THREE QUARTER DISP (DRAPES) ×6 IMPLANT
DRAPE SURG IRRIG POUCH 19X23 (DRAPES) ×2 IMPLANT
DRAPE TABLE BACK 44X90 PK DISP (DRAPES) ×2 IMPLANT
DRAPE UTILITY XL STRL (DRAPES) ×2 IMPLANT
DRAPE WARM FLUID 44X44 (DRAPE) ×2 IMPLANT
DRSG TEGADERM 6X8 (GAUZE/BANDAGES/DRESSINGS) ×4 IMPLANT
ELECT REM PT RETURN 9FT ADLT (ELECTROSURGICAL) ×2
ELECTRODE REM PT RTRN 9FT ADLT (ELECTROSURGICAL) ×1 IMPLANT
GAUZE VASELINE 3X9 (GAUZE/BANDAGES/DRESSINGS) IMPLANT
GLOVE BIO SURGEON STRL SZ 6.5 (GLOVE) ×8 IMPLANT
GLOVE BIO SURGEON STRL SZ7.5 (GLOVE) ×4 IMPLANT
GLOVE BIOGEL PI IND STRL 7.0 (GLOVE) ×2 IMPLANT
GLOVE BIOGEL PI INDICATOR 7.0 (GLOVE) ×2
GOWN PREVENTION PLUS XLARGE (GOWN DISPOSABLE) ×10 IMPLANT
HOLDER FOLEY CATH W/STRAP (MISCELLANEOUS) ×2 IMPLANT
KIT ACCESSORY DA VINCI DISP (KITS) ×1
KIT ACCESSORY DVNC DISP (KITS) ×1 IMPLANT
MANIPULATOR UTERINE 4.5 ZUMI (MISCELLANEOUS) ×2 IMPLANT
OCCLUDER COLPOPNEUMO (BALLOONS) ×2 IMPLANT
PACK LAPAROSCOPY W LONG (CUSTOM PROCEDURE TRAY) ×2 IMPLANT
POUCH SPECIMEN RETRIEVAL 10MM (ENDOMECHANICALS) ×4 IMPLANT
SET TUBE IRRIG SUCTION NO TIP (IRRIGATION / IRRIGATOR) ×2 IMPLANT
SHEET LAVH (DRAPES) ×2 IMPLANT
SOLUTION ELECTROLUBE (MISCELLANEOUS) ×2 IMPLANT
SPONGE LAP 18X18 X RAY DECT (DISPOSABLE) IMPLANT
STRIP CLOSURE SKIN 1/2X4 (GAUZE/BANDAGES/DRESSINGS) ×2 IMPLANT
SUT VIC AB 0 CT1 27 (SUTURE) ×1
SUT VIC AB 0 CT1 27XBRD ANTBC (SUTURE) ×1 IMPLANT
SUT VIC AB 4-0 PS2 27 (SUTURE) ×4 IMPLANT
SUT VICRYL 0 UR6 27IN ABS (SUTURE) ×2 IMPLANT
SYR 50ML LL SCALE MARK (SYRINGE) ×2 IMPLANT
SYR BULB IRRIGATION 50ML (SYRINGE) IMPLANT
TRAP SPECIMEN MUCOUS 40CC (MISCELLANEOUS) IMPLANT
TRAY FOLEY CATH 14FRSI W/METER (CATHETERS) ×2 IMPLANT
TROCAR XCEL 12X100 BLDLESS (ENDOMECHANICALS) ×2 IMPLANT
TROCAR XCEL BLADELESS 5X75MML (TROCAR) ×2 IMPLANT
TUBING FILTER THERMOFLATOR (ELECTROSURGICAL) ×2 IMPLANT
WATER STERILE IRR 1500ML POUR (IV SOLUTION) ×4 IMPLANT

## 2011-11-23 NOTE — Progress Notes (Signed)
RT went to see patient about setting up a CPAP for her and she refused it.  She has tried to wear CPAP many times before but is claustrophobic and can't tolerate it.

## 2011-11-23 NOTE — Anesthesia Preprocedure Evaluation (Signed)
Anesthesia Evaluation  Patient identified by MRN, date of birth, ID band Patient awake    Reviewed: Allergy & Precautions, H&P , NPO status , Patient's Chart, lab work & pertinent test results  Airway Mallampati: II TM Distance: >3 FB Neck ROM: Full    Dental  (+) Upper Dentures, Lower Dentures and Dental Advisory Given   Pulmonary asthma , sleep apnea (noncompliant with CPAP) ,  breath sounds clear to auscultation  Pulmonary exam normal       Cardiovascular hypertension, + dysrhythmias Rhythm:Regular Rate:Normal     Neuro/Psych  Headaches, PSYCHIATRIC DISORDERS (Hx. OCD) Anxiety Depression Chronic low back pain  Neuromuscular disease    GI/Hepatic negative GI ROS, Neg liver ROS,   Endo/Other  Hypothyroidism   Renal/GU negative Renal ROS  negative genitourinary   Musculoskeletal  (+) Fibromyalgia -  Abdominal   Peds negative pediatric ROS (+)  Hematology negative hematology ROS (+)   Anesthesia Other Findings   Reproductive/Obstetrics negative OB ROS                           Anesthesia Physical Anesthesia Plan  ASA: III  Anesthesia Plan: General   Post-op Pain Management:    Induction: Intravenous  Airway Management Planned: Oral ETT  Additional Equipment:   Intra-op Plan:   Post-operative Plan: Extubation in OR  Informed Consent: I have reviewed the patients History and Physical, chart, labs and discussed the procedure including the risks, benefits and alternatives for the proposed anesthesia with the patient or authorized representative who has indicated his/her understanding and acceptance.   Dental advisory given  Plan Discussed with: CRNA  Anesthesia Plan Comments:         Anesthesia Quick Evaluation

## 2011-11-23 NOTE — Interval H&P Note (Signed)
History and Physical Interval Note:  11/23/2011 12:23 PM  [name]  has presented today for surgery, with the diagnosis of endometrial cancer  The various methods of treatment have been discussed with the patient and family. After consideration of risks, benefits and other options for treatment, the patient has consented to  Procedure(s) (LRB): ROBOTIC ASSISTED TOTAL HYSTERECTOMY WITH BILATERAL SALPINGO OOPHERECTOMY (N/A) as a surgical intervention .  The patient's history has been reviewed, patient examined, no change in status, stable for surgery.  I have reviewed the patient's chart and labs.  Questions were answered to the patient's satisfaction.  She understands the risk of laparotomy.   Antonea Gaut A.

## 2011-11-23 NOTE — Preoperative (Signed)
Beta Blockers   Reason not to administer Beta Blockers:Not Applicable 

## 2011-11-23 NOTE — Transfer of Care (Signed)
Immediate Anesthesia Transfer of Care Note  Patient: [name]  Procedure(s) Performed: Procedure(s) (LRB): ROBOTIC ASSISTED TOTAL HYSTERECTOMY WITH BILATERAL SALPINGO OOPHERECTOMY (N/A)  Patient Location: PACU  Anesthesia Type: General  Level of Consciousness: awake, alert  and responds to stimulation  Airway & Oxygen Therapy: Patient Spontanous Breathing and Patient connected to face mask oxygen  Post-op Assessment: Report given to PACU RN, Post -op Vital signs reviewed and stable and Patient moving all extremities  Post vital signs: Reviewed and stable  Complications: No apparent anesthesia complications

## 2011-11-23 NOTE — H&P (View-Only) (Signed)
H&P  [name] 56 y.o. female  CC:  Chief Complaint  Patient presents with  . Endometrial adenocarcinoma    Follow up  [name] is a 55-year-old gravida 1 para 1 has been bleeding for greater than one year's time. I last saw her 4/13. She was seen by Dr. Jackson Moore and had an endometrial biopsy performed revealed a grade 1 endometrial carcinoma. It is for this reason she is referred to us today. It is very difficult to get a history from the patient as she is very tangential. It sounds like she went through menopause at the age of 49 she took hormone replacement therapy for about a month's time.  She had been bleeding for greater than 9 months. She states that she was scared and never went to see a physician for this. She was is complaining of abdominal pain. She states that 3 years ago she was supposed to have a cholecystectomy. Early she was scared and never went out for this. She states she does have known as fatty liver.   Interval History:  She been seen seen by Dr. McAlhany  for preop risk stratification. She had a Holter done that showed normal spot sinus rhythm rare PACs and one PVC. She also had an echocardiogram that was unremarkable. She unfortunately had missed multiple appointments with the cardiologist on over the 6 weeks to discuss the results. She has been "cleared for surgery and we gave recommendations to proceed with surgery. She was also seen by Dr. Singh when she was admitted to the hospital. She was admitted with headaches questionable meningitis. She had a lumbar tap done that was essentially unremarkable. Her post discharge diagnoses include hypothyroidism hypertension asthma headaches and depression. She had a CT MRI and EEG that were stable. She was seen by infectious disease and neurology and was cleared for discharge and followup. She comes in today to reestablish yourself as we have not seen her since April in for discussion of surgery.  Review of Systems: She  denies any vaginal bleeding.She occasionally has some chest pains but she's unsure of the chest pain is related to the abdominal pain that she has or not. She states she does have sleep apnea she stopped using her CPAP machine. She is very tired during the day she so tired she states "she can't take a bath". She complains of back pain a right-sided abdominal pain. She states that she is spinal stenosis both of her lumbar spine as well as her C-spine osteoarthritis and bulging discs. Change in her bowel or bladder habits any unintentional weight loss weight gain.    Past Medical Hx:  Past Medical History  Diagnosis Date  . Sleep apnea   . Hypertension   . Thyroid disease   . Arthritis   . Asthma   . Depression   . Anxiety   . Fibromyalgia   . Lumbar spondylolysis   . Allergy   . Endometrial cancer   . Spinal stenosis    Past Surgical Hx:  Past Surgical History  Procedure Date  . Cesarean section    Current Meds:  Outpatient Encounter Prescriptions as of 11/17/2011  Medication Sig Dispense Refill  . B Complex Vitamins (VITAMIN B COMPLEX) TABS Take 1 tablet by mouth daily.      . Coenzyme Q10 (CO Q 10) 60 MG CAPS Take 1 tablet by mouth daily.      . Lecithin 1200 MG CAPS Take 1 capsule by mouth daily.      .   levothyroxine (SYNTHROID, LEVOTHROID) 125 MCG tablet Take 125 mcg by mouth daily before breakfast.       . LORazepam (ATIVAN) 1 MG tablet Take 1-2 mg by mouth 2 (two) times daily.      . Selenium 200 MCG TABS Take 1 tablet by mouth daily.      . sertraline (ZOLOFT) 100 MG tablet Take 200 mg by mouth at bedtime.      . thiothixene (NAVANE) 2 MG capsule Take 2 mg by mouth at bedtime.      . vitamin C (ASCORBIC ACID) 500 MG tablet Take 500 mg by mouth 2 (two) times daily.      . vitamin E (VITAMIN E) 400 UNIT capsule Take 800 Units by mouth daily.      . Zinc 22.5 MG TABS Take 1 tablet by mouth daily.       Allergy:  Allergies  Allergen Reactions  . Sulfamethoxazole  W-Trimethoprim Other (See Comments)    Unknown reaction   Social Hx:   History   Social History  . Marital Status: Single    Spouse Name: N/A    Number of Children: N/A  . Years of Education: N/A   Occupational History  . Not on file.   Social History Main Topics  . Smoking status: Never Smoker   . Smokeless tobacco: Never Used  . Alcohol Use: No  . Drug Use: No  . Sexually Active: Not on file   Other Topics Concern  . Not on file   Social History Narrative  . No narrative on file   Family Hx:  Family History  Problem Relation Age of Onset  . Hypertension Mother   . Coronary artery disease Father   . Cancer Brother     Renal cell carcinoma  . Hypertension Other   . Cancer Other   . Heart attack Father   . Coronary artery disease Brother     Pertinent Gynecological History:  ROS: Vitals:  Blood pressure 106/78, pulse 68, temperature 98.5 F (36.9 C), temperature source Oral, resp. rate 18, height 4' 9.48" (1.46 m), weight 201 lb 8 oz (91.4 kg).  Physical Exam: Well-developed but poorly kept female in no acute distress.  Neck: No lymphadenopathy no thyromegaly.  Lungs: Auscultation bilaterally.  Cardiovascular regular rate rhythm without murmur. No tachycardia no palpitations noted.  Abdomen: Mildly obese. Soft nontender nondistended. There no palpable mass of the exam is limited by habitus.+ yeast in the lower abdomen.  Groins: Candida infection in the left intertriginous.  Extremities: 1+ nonpitting edema.  Pelvic: Normal external female genitalia. Very narrow pubic arch. Bimanual examination is limited by habitus is no nodularity.   Assessment/Plan:  Extremities gravida 1 para 1 with history of grade 1 endometrial carcinoma clinical stage one. I discussed with her proceeding with robotic-assisted hysterectomy BSO. The uterus be sent for frozen section and lipase lymphadenectomy based on this. She understands that we may need to proceed with an abdominal  incision secondary to visualization due to her obesity and narrow pubic arch. We also discussed that she [name] be using her CPAP machine as untreated sleep apnea carries with it significant medical complications. We appreciate our consultants efforts with pre-operative care. She was encouraged to shower prior to surgery to decrease infectious morbidity and to use her surgical prep (hibiclens shower) as directed the morning of surgery.   Risks and benefits of surgery were discussed with the patient and she very much wishes to proceed.  Assessment/Plan:   Ivi Griffith   A., MD 11/17/2011, 2:39 PM      

## 2011-11-23 NOTE — Anesthesia Postprocedure Evaluation (Signed)
Anesthesia Post Note  Patient: [name]  Procedure(s) Performed: Procedure(s) (LRB): ROBOTIC ASSISTED TOTAL HYSTERECTOMY WITH BILATERAL SALPINGO OOPHERECTOMY (N/A)  Anesthesia type: General  Patient location: PACU  Post pain: Pain level controlled  Post assessment: Post-op Vital signs reviewed  Last Vitals:  Filed Vitals:   11/23/11 1540  BP: 136/76  Pulse: 108  Temp: 36.3 C  Resp: 8    Post vital signs: Reviewed  Level of consciousness: sedated  Complications: No apparent anesthesia complications

## 2011-11-23 NOTE — Progress Notes (Signed)
Gave pt a CHG cloth bath including abdomen per Harriett Sine RN request.  The lower abdominal area under fold at upper pubis area has broken skin area. Skin cleaned and Dagmar Hait RN notified.

## 2011-11-23 NOTE — Op Note (Signed)
PATIENT: [name] DATE OF BIRTH: 02/21/1956 ENCOUNTER DATE: 11/23/11   Preop Diagnosis: grade 1 endometrioid adenocarcinoma.   Postoperative Diagnosis: same.   Surgery: Total robotic hysterectomy bilateral salpingo-oophorectomy  Surgeons:  Rejeana Brock A. Duard Brady, MD; Antionette Char, MD   Assistant: Telford Nab   Anesthesia: General   Estimated blood loss: 25 ml   IVF: 1600 ml   Urine output: 250 ml   Complications: None   Pathology: Uterus, cervix, bilateral tubes and ovaries  Operative findings: Significant intra-abdominal obesity, redundant sigmoid colon with large epiploica.  Frozen section with grade 1 lesions with < 50% myometrial invasion.  Procedure: The patient was identified in the preoperative holding area. Informed consent was signed on the chart. Patient was seen history was reviewed and exam was performed.   The patient was then taken to the operating room and placed in the supine position with SCD hose on. General anesthesia was then induced without difficulty. She was then placed in the dorsolithotomy position. Her arms were tucked at her side with appropriate precautions on the gel pad with sleds. Shoulder blocks were then placed in the usual fashion with appropriate precautions. A OG-tube was placed to suction. First timeout was performed to confirm the patient procedure antibiotic allergy status, estimated estimated blood loss and OR time. The perineum was then prepped in the usual fashion with Betadine. A 14 French Foley was inserted into the bladder under sterile conditions. A sterile speculum was placed in the vagina. The cervix was without lesions. The cervix was grasped with a single-tooth tenaculum. The dilator without difficulty. A ZUMI with a small Koe ring was placed without difficulty. The abdomen was then prepped with 2 Chlor prep sponges per protocol.   Patient was then draped after the prep was dried. Second timeout was performed to confirm the  above. After again confirming OG tube placement and it was to suction. A stab-wound was made in left upper quadrant 2 cm below the costal margin on the left in the midclavicular line. A 5 mm operative report was used to assure intra-abdominal placement. The abdomen was insufflated. At this point all points during the procedure the patient's intra-abdominal pressure was not increased over 15 mm of mercury. After insufflation was complete, the patient was placed in deep Trendelenburg position. We needed to decrease intra-abdominal pressure to 12 mm Hg due to low tidal volumes.  25 cm above the pubic symphysis that area was marked the camera port. Bilateral robotic ports were marked 10 cm from the midline incision at approximately 5 angle and a fourth arm was marked approximately 2 cm above and medial to the ACIS.  Under direct visualization each of the trochars was placed into the abdomen. The small bowel was folded on its mesentery to allow visualization to the pelvis. The 5 mm LUQ port was then converted to a 10/12 port under direct visualization.  After assuring adequate visualization, the robot was then docked in the usual fashion. Under direct visualization the robotic instruments replaced.   The fourth arm with the prograsp was used to gently hold the redundant rectosigmoid colon out of the pelvic operating field.  The round ligament on the patient's right side was transected with monopolar cautery. The anterior and posterior leaves of the broad ligament were then taken down in the usual fashion. The ureter was identified on the medial leaf of the broad ligament. A window was made between [facility] and the ureter. The IP was coagulated with bipolar cautery and transected. The  posterior leaf of the broad ligament was taken down to the level of the KOH ring. The bladder flap was created using meticulous dissection and pinpoint cautery. The uterine vessels were coagulated with bipolar cautery. The uterine vessels were  then transected and the C loop was created. The same procedure was performed on the patient's left side.   The pneumo-occulder in the vagina was then insufflated. The colpotomy was then created in the usual fashion. The specimen was then delivered to the vagina and sent for frozen section. Due to the inability to perform robotic pelvic lymph node dissection we awaited frozen section. Frozen section returned as minimal myometrial invasion of grade 1 lesion. The vaginal cuff was closed with a running 0 Vicryl on CT 1 suture. The abdomen and pelvis were copiously irrigated and noted to be hemostatic. The robotic instruments were removed under direct visualization as were the robotic trochars. The pneumoperitoneum was removed.   The patient was then taken out of the Trendelenburg position. Using of 0 Vicryl on a UR 6 needle the midline port fascia was closed and in the left upper quadrant port site the subcutaneous tissues were reapproximated. The skin was closed using 4-0 Vicryl. Steri-Strips and benzoin were applied. The pneumo occluded balloon was removed from the vagina. The vagina was swabbed and noted to be hemostatic.   All instrument needle and Ray-Tec counts were correct x2. The patient tolerated the procedure well and was taken to the recovery room in stable condition. This is [name] dictating an operative note on patient [name].

## 2011-11-24 ENCOUNTER — Emergency Department (HOSPITAL_COMMUNITY): Payer: Medicaid Other

## 2011-11-24 ENCOUNTER — Telehealth: Payer: Self-pay | Admitting: *Deleted

## 2011-11-24 ENCOUNTER — Encounter (HOSPITAL_COMMUNITY): Payer: Self-pay | Admitting: *Deleted

## 2011-11-24 ENCOUNTER — Observation Stay (HOSPITAL_BASED_OUTPATIENT_CLINIC_OR_DEPARTMENT_OTHER)
Admission: EM | Admit: 2011-11-24 | Discharge: 2011-11-26 | Disposition: A | Discharge status: Home / Self Care | Payer: Medicaid Other | Source: Home / Self Care | Attending: Emergency Medicine | Admitting: Emergency Medicine

## 2011-11-24 DIAGNOSIS — G8918 Other acute postprocedural pain: Secondary | ICD-10-CM | POA: Diagnosis present

## 2011-11-24 DIAGNOSIS — J45909 Unspecified asthma, uncomplicated: Secondary | ICD-10-CM

## 2011-11-24 DIAGNOSIS — F32A Depression, unspecified: Secondary | ICD-10-CM | POA: Diagnosis present

## 2011-11-24 DIAGNOSIS — E039 Hypothyroidism, unspecified: Secondary | ICD-10-CM | POA: Diagnosis present

## 2011-11-24 DIAGNOSIS — C541 Malignant neoplasm of endometrium: Secondary | ICD-10-CM | POA: Diagnosis present

## 2011-11-24 DIAGNOSIS — E860 Dehydration: Secondary | ICD-10-CM

## 2011-11-24 DIAGNOSIS — M4800 Spinal stenosis, site unspecified: Secondary | ICD-10-CM | POA: Diagnosis present

## 2011-11-24 DIAGNOSIS — R51 Headache: Secondary | ICD-10-CM

## 2011-11-24 DIAGNOSIS — F329 Major depressive disorder, single episode, unspecified: Secondary | ICD-10-CM

## 2011-11-24 DIAGNOSIS — I10 Essential (primary) hypertension: Secondary | ICD-10-CM | POA: Diagnosis present

## 2011-11-24 LAB — COMPREHENSIVE METABOLIC PANEL
ALT: 28 U/L (ref 0–35)
AST: 33 U/L (ref 0–37)
Albumin: 3.6 g/dL (ref 3.5–5.2)
Alkaline Phosphatase: 52 U/L (ref 39–117)
BUN: 7 mg/dL (ref 6–23)
Potassium: 3.7 mEq/L (ref 3.5–5.1)
Sodium: 135 mEq/L (ref 135–145)
Total Protein: 6.7 g/dL (ref 6.0–8.3)

## 2011-11-24 LAB — URINALYSIS, ROUTINE W REFLEX MICROSCOPIC
Glucose, UA: NEGATIVE mg/dL
Ketones, ur: NEGATIVE mg/dL
pH: 7 (ref 5.0–8.0)

## 2011-11-24 LAB — CBC WITH DIFFERENTIAL/PLATELET
Basophils Absolute: 0 10*3/uL (ref 0.0–0.1)
Basophils Relative: 0 % (ref 0–1)
Eosinophils Absolute: 0.1 10*3/uL (ref 0.0–0.7)
MCH: 30.4 pg (ref 26.0–34.0)
MCHC: 34.6 g/dL (ref 30.0–36.0)
Neutrophils Relative %: 57 % (ref 43–77)
Platelets: 166 10*3/uL (ref 150–400)
RDW: 13.3 % (ref 11.5–15.5)

## 2011-11-24 LAB — CBC
Hemoglobin: 12.6 g/dL (ref 12.0–15.0)
MCH: 30.4 pg (ref 26.0–34.0)
Platelets: 174 10*3/uL (ref 150–400)
RBC: 4.15 MIL/uL (ref 3.87–5.11)
WBC: 7.4 10*3/uL (ref 4.0–10.5)

## 2011-11-24 LAB — BASIC METABOLIC PANEL
CO2: 27 mEq/L (ref 19–32)
Calcium: 8.8 mg/dL (ref 8.4–10.5)
Chloride: 100 mEq/L (ref 96–112)
Glucose, Bld: 144 mg/dL — ABNORMAL HIGH (ref 70–99)
Potassium: 4.3 mEq/L (ref 3.5–5.1)
Sodium: 135 mEq/L (ref 135–145)

## 2011-11-24 LAB — URINE MICROSCOPIC-ADD ON

## 2011-11-24 MED ORDER — HYDROMORPHONE HCL PF 1 MG/ML IJ SOLN
1.0000 mg | Freq: Once | INTRAMUSCULAR | Status: AC
  Administered 2011-11-24: 1 mg via INTRAVENOUS
  Filled 2011-11-24: qty 1

## 2011-11-24 MED ORDER — HYDROMORPHONE HCL 2 MG PO TABS
2.0000 mg | ORAL_TABLET | ORAL | Status: DC | PRN
  Administered 2011-11-24: 2 mg via ORAL
  Filled 2011-11-24: qty 1

## 2011-11-24 MED ORDER — DIPHENHYDRAMINE HCL 50 MG/ML IJ SOLN
12.5000 mg | Freq: Once | INTRAMUSCULAR | Status: AC
  Administered 2011-11-24: 12.5 mg via INTRAVENOUS
  Filled 2011-11-24: qty 1

## 2011-11-24 MED ORDER — METOCLOPRAMIDE HCL 5 MG/ML IJ SOLN
10.0000 mg | Freq: Once | INTRAMUSCULAR | Status: AC
  Administered 2011-11-24: 10 mg via INTRAVENOUS
  Filled 2011-11-24: qty 2

## 2011-11-24 MED ORDER — HYDROMORPHONE HCL 2 MG PO TABS: 2.0000 mg | ORAL_TABLET | ORAL | Status: AC | PRN

## 2011-11-24 MED ORDER — KETOROLAC TROMETHAMINE 30 MG/ML IJ SOLN
30.0000 mg | Freq: Once | INTRAMUSCULAR | Status: AC
  Administered 2011-11-24: 30 mg via INTRAVENOUS
  Filled 2011-11-24: qty 1

## 2011-11-24 MED ORDER — SODIUM CHLORIDE 0.9 % IV SOLN
Freq: Once | INTRAVENOUS | Status: AC
  Administered 2011-11-24: 21:00:00 via INTRAVENOUS

## 2011-11-24 NOTE — Discharge Summary (Signed)
Physician Discharge Summary  Patient ID: [name] MRN: [id number] DOB/AGE: 57-Feb-1957 57 y.o.  Admit date: 11/23/2011 Discharge date: 11/24/2011  Admission Diagnoses: Endometrial ca  Discharge Diagnoses:  Principal Problem:  *Endometrial ca  Discharged Condition: stable  Hospital Course: On 11/23/2011, the patient underwent the following: Procedure(s): ROBOTIC ASSISTED TOTAL HYSTERECTOMY WITH BILATERAL SALPINGO OOPHERECTOMY.   The postoperative course was uneventful.  She was discharged to home on postoperative day 1 tolerating a regular diet.  Consults: None  Significant Diagnostic Studies: None  Treatments: surgery: See above  Discharge Exam: Blood pressure 132/70, pulse 92, temperature 98.7 F (37.1 C), temperature source Oral, resp. rate 18, height 4\' 10"  (1.473 m), weight 198 lb (89.812 kg), SpO2 97.00%. General appearance: alert, cooperative and no distress Resp: clear to auscultation bilaterally Cardio: regular rate and rhythm, S1, S2 normal, no murmur, click, rub or gallop GI: soft, non-tender; bowel sounds normal; no masses,  no organomegaly and abd obese Extremities: extremities normal, atraumatic, no cyanosis or edema Incision/Wound: Lap sites with steri strips clean, dry, and intact  Disposition: 01-Home or Self Care  Discharge Orders    Future Orders Please Complete By Expires   Diet - low sodium heart healthy      Increase activity slowly      Driving Restrictions      Comments:   No driving for 1 week.  Do not take narcotics and drive.   Lifting restrictions      Comments:   No lifting greater than 10 lbs.   Sexual Activity Restrictions      Comments:   No sexual activity, nothing in the vagina, for 8 weeks.   Call MD for:  temperature >100.4      Call MD for:  persistant nausea and vomiting      Call MD for:  severe uncontrolled pain      Call MD for:  redness, tenderness, or signs of infection (pain, swelling, redness, odor or green/yellow  discharge around incision site)      Call MD for:  difficulty breathing, headache or visual disturbances      Call MD for:  hives      Call MD for:  persistant dizziness or light-headedness      Call MD for:  extreme fatigue        Medication List  As of 11/24/2011  9:59 AM   TAKE these medications         Co Q 10 60 MG Caps   Take 1 tablet by mouth daily.      hydrochlorothiazide 12.5 MG tablet   Commonly known as: HYDRODIURIL   Take 12.5 mg by mouth every morning. PT JUST STARTED HCTZ FOR HER B/P TODAY 11/19/11      HYDROmorphone 2 MG tablet   Commonly known as: DILAUDID   Take 1 tablet (2 mg total) by mouth every 4 (four) hours as needed for pain.      Lecithin 1200 MG Caps   Take 1 capsule by mouth daily.      levothyroxine 125 MCG tablet   Commonly known as: SYNTHROID, LEVOTHROID   Take 125 mcg by mouth daily before breakfast.      LORazepam 1 MG tablet   Commonly known as: ATIVAN   Take 1 mg by mouth 2 (two) times daily.      Selenium 200 MCG Tabs   Take 1 tablet by mouth daily.      sertraline 100 MG tablet   Commonly known  as: ZOLOFT   Take 200 mg by mouth. TAKES AT 8:00 PM      thiothixene 2 MG capsule   Commonly known as: NAVANE   Take 2 mg by mouth at bedtime.      Vitamin B Complex Tabs   Take 1 tablet by mouth daily.      vitamin C 500 MG tablet   Commonly known as: ASCORBIC ACID   Take 500 mg by mouth 2 (two) times daily.      vitamin E 400 UNIT capsule   Generic drug: vitamin E   Take 800 Units by mouth daily.      Zinc 22.5 MG Tabs   Take 1 tablet by mouth daily.           Follow-up Information    Please follow up. (GYN Oncology office to call with final path report and to schedule follow up visit [phone])         Signed: CROSS, MELISSA DEAL 11/24/2011, 9:59 AM

## 2011-11-24 NOTE — Progress Notes (Signed)
Pt has dangled x1 this shift and tolerated well, no s/s of n/v or dizziness. F/C d/c per order. No complaints of pain or discomfort. Waiting for her to void.

## 2011-11-24 NOTE — Telephone Encounter (Signed)
1635--Rec'd a call in triage from a patient that was discharge from hospital yesterday with severe abdominal pain following hysterectomy.  She says her pain meds are not working. Patient has not been established here at [facility] with Medical Oncology at this time. Informed patient to call back to [facility], ask for MD on-call for GYN-ONC group, Dr Antionette Char. If unable to get assistance this way, she is to go to local ER. Since she is not a patient established here, this nurse is unable to assist with any other recommendations.

## 2011-11-24 NOTE — ED Notes (Signed)
Per EMS pt had hysterectomy yesterday, did spinal tap for spinal menigititis, site reddened, tender to touch, sent home w/ 2mg  dilaudid, not helping w/ pain, given antibiotic and dilaudid IV here, lives at home w/ mom. BP 140/70, 2nd BP 160/80

## 2011-11-24 NOTE — ED Notes (Signed)
ZOX:[id number]<AV> Expected date:<BR> Expected time:<BR> Means of arrival:<BR> Comments:<BR> Hold ems

## 2011-11-24 NOTE — ED Provider Notes (Signed)
History     CSN: [id number]  Arrival date & time 11/24/11  1839   First MD Initiated Contact with Patient 11/24/11 2008      Chief Complaint  Patient presents with  . Abdominal Pain  . Headache  . Torticollis    (Consider location/radiation/quality/duration/timing/severity/associated sxs/prior treatment) Patient is a 56 y.o. female presenting with abdominal pain and headaches. The history is provided by the patient.  Abdominal Pain The primary symptoms of the illness include abdominal pain and nausea. The primary symptoms of the illness do not include fever, shortness of breath, vomiting, dysuria or vaginal discharge.  Associated symptoms comments: The patient returns to the ED tonight after being discharged this morning following admission for RA total hysterectomy performed yesterday. She complains of abdominal pain, upper back, neck pain as well as "pain all over". She reports she had pain when she left the hospital as well. No fever, but reports her "skin feels warm to the touch". She reports PO Dilaudid does not work to control her pain at home. Marland Kitchen  Headache  Associated symptoms include nausea. Pertinent negatives include no fever, no shortness of breath and no vomiting.    Past Medical History  Diagnosis Date  . Thyroid disease   . Arthritis   . Asthma   . Depression   . Fibromyalgia   . Lumbar spondylolysis   . Allergy   . Endometrial cancer   . Spinal stenosis   . Hypertension   . Dysrhythmia     OCCAS PAC'S AND PVC'S  PER HOLTER MONITOR STUDY --PT HAS OCCAS CHEST PAINS AND PALPITATIONS--HAD ECHO NORMAL LV SIZE AND FUNCTION.  Marland Kitchen Sleep apnea     HAS CPAP MACHINE BUT DOES NOT USE  . Anxiety     HAS OCD AND ON DISABILITY-PER [name]     Past Surgical History  Procedure Date  . Cesarean section     Family History  Problem Relation Age of Onset  . Hypertension Mother   . Coronary artery disease Father   . Cancer Brother     Renal cell carcinoma  .  Hypertension Other   . Cancer Other   . Heart attack Father   . Coronary artery disease Brother     History  Substance Use Topics  . Smoking status: Never Smoker   . Smokeless tobacco: Never Used  . Alcohol Use: No    OB History    Grav Para Term Preterm Abortions TAB SAB Ect Mult Living   1 1              Review of Systems  Constitutional: Negative for fever.  Respiratory: Negative for shortness of breath.   Gastrointestinal: Positive for nausea and abdominal pain. Negative for vomiting.  Genitourinary: Negative for dysuria and vaginal discharge.  Neurological: Positive for headaches.    Allergies  Sulfamethoxazole w-trimethoprim  Home Medications   Current Outpatient Rx  Name Route Sig Dispense Refill  . VITAMIN B COMPLEX PO TABS Oral Take 1 tablet by mouth daily.    . CO Q 10 60 MG PO CAPS Oral Take 1 tablet by mouth daily.    Marland Kitchen HYDROCHLOROTHIAZIDE 12.5 MG PO TABS Oral Take 12.5 mg by mouth every morning. PT JUST STARTED HCTZ FOR HER B/P TODAY 11/19/11    . HYDROMORPHONE HCL 2 MG PO TABS Oral Take 1 tablet (2 mg total) by mouth every 4 (four) hours as needed for pain. 40 tablet 0  . LECITHIN 1200 MG PO CAPS  Oral Take 1 capsule by mouth daily.    Marland Kitchen LEVOTHYROXINE SODIUM 125 MCG PO TABS Oral Take 125 mcg by mouth daily before breakfast.     . LORAZEPAM 1 MG PO TABS Oral Take 1 mg by mouth 2 (two) times daily.     Marland Kitchen OMEPRAZOLE 20 MG PO CPDR Oral Take 20 mg by mouth daily.    . SELENIUM 200 MCG PO TABS Oral Take 1 tablet by mouth daily.    . SERTRALINE HCL 100 MG PO TABS Oral Take 200 mg by mouth daily. TAKES AT 8:00 PM    . THIOTHIXENE 2 MG PO CAPS Oral Take 2 mg by mouth at bedtime.    Marland Kitchen VITAMIN C 500 MG PO TABS Oral Take 500 mg by mouth 2 (two) times daily.    Marland Kitchen VITAMIN E 400 UNITS PO CAPS Oral Take 800 Units by mouth daily.    Marland Kitchen ZINC 22.5 MG PO TABS Oral Take 1 tablet by mouth daily.      BP 102/56  Pulse 95  Temp 99.9 F (37.7 C) (Rectal)  Resp 18  SpO2  96%  Physical Exam  ED Course  Procedures (including critical care time)  Labs Reviewed  CBC WITH DIFFERENTIAL - Abnormal; Notable for the following:    HCT 35.3 (*)     All other components within normal limits  COMPREHENSIVE METABOLIC PANEL - Abnormal; Notable for the following:    Glucose, Bld 100 (*)     All other components within normal limits  URINALYSIS, ROUTINE W REFLEX MICROSCOPIC - Abnormal; Notable for the following:    APPearance CLOUDY (*)     Hgb urine dipstick TRACE (*)     Leukocytes, UA TRACE (*)     All other components within normal limits  URINE MICROSCOPIC-ADD ON  URINE CULTURE   Results for orders placed during the hospital encounter of 11/24/11  CBC WITH DIFFERENTIAL      Component Value Range   WBC 8.1  4.0 - 10.5 K/uL   RBC 4.01  3.87 - 5.11 MIL/uL   Hemoglobin 12.2  12.0 - 15.0 g/dL   HCT 36.6 (*) 44.0 - 34.7 %   MCV 88.0  78.0 - 100.0 fL   MCH 30.4  26.0 - 34.0 pg   MCHC 34.6  30.0 - 36.0 g/dL   RDW 42.5  95.6 - 38.7 %   Platelets 166  150 - 400 K/uL   Neutrophils Relative 57  43 - 77 %   Neutro Abs 4.6  1.7 - 7.7 K/uL   Lymphocytes Relative 29  12 - 46 %   Lymphs Abs 2.4  0.7 - 4.0 K/uL   Monocytes Relative 12  3 - 12 %   Monocytes Absolute 0.9  0.1 - 1.0 K/uL   Eosinophils Relative 2  0 - 5 %   Eosinophils Absolute 0.1  0.0 - 0.7 K/uL   Basophils Relative 0  0 - 1 %   Basophils Absolute 0.0  0.0 - 0.1 K/uL  COMPREHENSIVE METABOLIC PANEL      Component Value Range   Sodium 135  135 - 145 mEq/L   Potassium 3.7  3.5 - 5.1 mEq/L   Chloride 98  96 - 112 mEq/L   CO2 25  19 - 32 mEq/L   Glucose, Bld 100 (*) 70 - 99 mg/dL   BUN 7  6 - 23 mg/dL   Creatinine, Ser 5.64  0.50 - 1.10 mg/dL   Calcium 9.0  8.4 - 10.5 mg/dL   Total Protein 6.7  6.0 - 8.3 g/dL   Albumin 3.6  3.5 - 5.2 g/dL   AST 33  0 - 37 U/L   ALT 28  0 - 35 U/L   Alkaline Phosphatase 52  39 - 117 U/L   Total Bilirubin 0.3  0.3 - 1.2 mg/dL   GFR calc non Af Amer >90  >90  mL/min   GFR calc Af Amer >90  >90 mL/min  URINALYSIS, ROUTINE W REFLEX MICROSCOPIC      Component Value Range   Color, Urine YELLOW  YELLOW   APPearance CLOUDY (*) CLEAR   Specific Gravity, Urine 1.012  1.005 - 1.030   pH 7.0  5.0 - 8.0   Glucose, UA NEGATIVE  NEGATIVE mg/dL   Hgb urine dipstick TRACE (*) NEGATIVE   Bilirubin Urine NEGATIVE  NEGATIVE   Ketones, ur NEGATIVE  NEGATIVE mg/dL   Protein, ur NEGATIVE  NEGATIVE mg/dL   Urobilinogen, UA 0.2  0.0 - 1.0 mg/dL   Nitrite NEGATIVE  NEGATIVE   Leukocytes, UA TRACE (*) NEGATIVE  URINE MICROSCOPIC-ADD ON      Component Value Range   Squamous Epithelial / LPF RARE  RARE   WBC, UA 3-6  <3 WBC/hpf   RBC / HPF 3-6  <3 RBC/hpf   Bacteria, UA RARE  RARE    Dg Abd Acute W/chest  11/24/2011  *RADIOLOGY REPORT*  Clinical Data: Abdominal pain, recent hysterectomy.  ACUTE ABDOMEN SERIES (ABDOMEN 2 VIEW & CHEST 1 VIEW)  Comparison: 08/27/2011  Findings: Heart size upper normal to mildly enlarged.  Mild retrocardiac opacity.  Nonobstructive bowel gas pattern.  A small amount of free intraperitoneal air is suggested, not necessarily unexpected in the setting of recent surgery.  No acute osseous finding.  IMPRESSION: Nonobstructive bowel gas pattern.  Small amount of free intraperitoneal air is suggested, not unexpected in the setting of recent surgery.  Retrocardiac opacity; atelectasis versus infiltrate.   Original Report Authenticated By: Waneta Martins, M.D.      No diagnosis found.  1. Myalgias 2. Post-operative pain 3. Fibromyalgia   MDM  The patient continues to complain of pain, stating "I can't move" because of the pain.She has been given IV Dilaudid without relief. Labs and x-ray are non-diagnostic. Will consult Dr. Tamela Oddi regarding post-operative considerations. Plan to admit for pain control.  Discussed with Dr. Tamela Oddi, who is well familiar with patient. She reports no post-operative concerns regarding  procedure. Discussed that patient would be getting admitted by hospitalist for pain control and she offered assistance at any time without regard to the complaints of abdominal pain thought related to hysterectomy.  Dr. Silverio Lay in to see patient. She continues to complain of pain without improvement. She refuses to attempt to get up or to move because she reports severe pain. Dr. Silverio Lay has paged the hospitalist for admission due to uncontrolled pain.       Rodena Medin, PA-C 11/24/11 2333

## 2011-11-25 ENCOUNTER — Observation Stay (HOSPITAL_COMMUNITY): Payer: Medicaid Other

## 2011-11-25 ENCOUNTER — Encounter (HOSPITAL_COMMUNITY): Payer: Self-pay

## 2011-11-25 ENCOUNTER — Encounter: Payer: Self-pay | Admitting: Gynecologic Oncology

## 2011-11-25 DIAGNOSIS — G8918 Other acute postprocedural pain: Secondary | ICD-10-CM | POA: Diagnosis present

## 2011-11-25 DIAGNOSIS — R51 Headache: Secondary | ICD-10-CM

## 2011-11-25 LAB — BASIC METABOLIC PANEL
BUN: 9 mg/dL (ref 6–23)
CO2: 30 mEq/L (ref 19–32)
GFR calc non Af Amer: >90 mL/min (ref >90)
Glucose, Bld: 99 mg/dL (ref 70–99)
Potassium: 4.7 mEq/L (ref 3.5–5.1)
Sodium: 138 mEq/L (ref 135–145)

## 2011-11-25 LAB — CBC
HCT: 34.7 % — ABNORMAL LOW (ref 36.0–46.0)
Hemoglobin: 11.9 g/dL — ABNORMAL LOW (ref 12.0–15.0)
MCH: 30.5 pg (ref 26.0–34.0)
MCHC: 34.3 g/dL (ref 30.0–36.0)
MCV: 89 fL (ref 78.0–100.0)
RBC: 3.9 MIL/uL (ref 3.87–5.11)

## 2011-11-25 MED ORDER — ALPRAZOLAM 0.5 MG PO TABS: 0.5000 mg | ORAL_TABLET | Freq: Two times a day (BID) | ORAL | Status: DC | PRN

## 2011-11-25 MED ORDER — ONDANSETRON HCL 4 MG/2ML IJ SOLN: 4.0000 mg | Freq: Four times a day (QID) | INTRAMUSCULAR | Status: DC | PRN

## 2011-11-25 MED ORDER — KETOROLAC TROMETHAMINE 30 MG/ML IJ SOLN: 30.0000 mg | Freq: Four times a day (QID) | INTRAMUSCULAR | Status: AC | PRN

## 2011-11-25 MED ORDER — ALUM & MAG HYDROXIDE-SIMETH 200-200-20 MG/5ML PO SUSP: 30.0000 mL | Freq: Four times a day (QID) | ORAL | Status: DC | PRN

## 2011-11-25 MED ORDER — HYDROMORPHONE HCL 2 MG PO TABS
2.0000 mg | ORAL_TABLET | ORAL | Status: DC | PRN
  Administered 2011-11-26: 2 mg via ORAL
  Filled 2011-11-25: qty 1

## 2011-11-25 MED ORDER — SENNOSIDES-DOCUSATE SODIUM 8.6-50 MG PO TABS
1.0000 | ORAL_TABLET | Freq: Two times a day (BID) | ORAL | Status: DC
  Administered 2011-11-26: 1 via ORAL
  Filled 2011-11-25 (×4): qty 1

## 2011-11-25 MED ORDER — LACTULOSE 10 GM/15ML PO SOLN
10.0000 g | Freq: Two times a day (BID) | ORAL | Status: DC | PRN
  Filled 2011-11-25: qty 15

## 2011-11-25 MED ORDER — VALACYCLOVIR HCL 500 MG PO TABS: 1000.0000 mg | ORAL_TABLET | Freq: Three times a day (TID) | ORAL | Status: DC

## 2011-11-25 MED ORDER — LEVOTHYROXINE SODIUM 125 MCG PO TABS
125.0000 ug | ORAL_TABLET | Freq: Every day | ORAL | Status: DC
  Administered 2011-11-25 – 2011-11-26 (×2): 125 ug via ORAL
  Filled 2011-11-25 (×3): qty 1

## 2011-11-25 MED ORDER — LORAZEPAM 1 MG PO TABS
1.0000 mg | ORAL_TABLET | Freq: Two times a day (BID) | ORAL | Status: DC
  Administered 2011-11-25 – 2011-11-26 (×4): 1 mg via ORAL
  Filled 2011-11-25 (×4): qty 1

## 2011-11-25 MED ORDER — HYDROCHLOROTHIAZIDE 12.5 MG PO CAPS
12.5000 mg | ORAL_CAPSULE | ORAL | Status: DC
  Administered 2011-11-25 – 2011-11-26 (×2): 12.5 mg via ORAL
  Filled 2011-11-25 (×2): qty 1

## 2011-11-25 MED ORDER — SERTRALINE HCL 100 MG PO TABS
200.0000 mg | ORAL_TABLET | Freq: Every day | ORAL | Status: DC
  Administered 2011-11-25 – 2011-11-26 (×2): 200 mg via ORAL
  Filled 2011-11-25 (×2): qty 2

## 2011-11-25 MED ORDER — HYDROMORPHONE HCL PF 2 MG/ML IJ SOLN
2.0000 mg | INTRAMUSCULAR | Status: DC | PRN
  Administered 2011-11-25 – 2011-11-26 (×5): 2 mg via INTRAVENOUS
  Filled 2011-11-25 (×5): qty 1

## 2011-11-25 MED ORDER — VALACYCLOVIR HCL 500 MG PO TABS
1000.0000 mg | ORAL_TABLET | Freq: Two times a day (BID) | ORAL | Status: DC
  Administered 2011-11-25 – 2011-11-26 (×3): 1000 mg via ORAL
  Filled 2011-11-25 (×4): qty 2

## 2011-11-25 MED ORDER — ONDANSETRON HCL 4 MG PO TABS: 4.0000 mg | ORAL_TABLET | Freq: Four times a day (QID) | ORAL | Status: DC | PRN

## 2011-11-25 MED ORDER — IOHEXOL 300 MG/ML  SOLN
100.0000 mL | Freq: Once | INTRAMUSCULAR | Status: AC | PRN
  Administered 2011-11-25: 100 mL via INTRAVENOUS

## 2011-11-25 MED ORDER — SENNOSIDES-DOCUSATE SODIUM 8.6-50 MG PO TABS
1.0000 | ORAL_TABLET | Freq: Every evening | ORAL | Status: DC | PRN
  Filled 2011-11-25: qty 1

## 2011-11-25 MED ORDER — PANTOPRAZOLE SODIUM 40 MG PO TBEC
40.0000 mg | DELAYED_RELEASE_TABLET | Freq: Every day | ORAL | Status: DC
  Administered 2011-11-25 – 2011-11-26 (×2): 40 mg via ORAL
  Filled 2011-11-25 (×2): qty 1

## 2011-11-25 NOTE — Progress Notes (Signed)
  Subjective: Patient reports pain all over and being unable to move extremities.  Reporting abdominal pain when taking a deep breath.  Stating having severe pain when bending her head forward.  Voicing concerns that she thinks she may have meningitis again.  Stating that her headache "never really went away" from her admission in July.  Last PO intake on 11/25/11 at breakfast.  Stating that she is "not really" passing flatus this am.  Reporting last episode of flatus yesterday prior to discharge.  Reporting "I am having to use the bedpan because I cannot move."  Denies having a bowel movement.  Denies nausea, vomiting, chest pain, and shortness of breath.  Prior to discharge on 11/24/11, pt stated that she did not want to go home because she would not be able to rest with all the animals in her house.  Objective: Vital signs in last 24 hours: Temp:  [98 F (36.7 C)-99.9 F (37.7 C)] 98.1 F (36.7 C) (08/29 0645) Pulse Rate:  [84-95] 84  (08/29 0645) Resp:  [16-20] 20  (08/29 0645) BP: (97-122)/(56-77) 112/77 mmHg (08/29 0645) SpO2:  [90 %-99 %] 90 % (08/29 0645) Weight:  [199 lb 15.3 oz (90.7 kg)] 199 lb 15.3 oz (90.7 kg) (08/29 0245) Last BM Date: 11/22/11  Intake/Output from previous day: 08/28 0701 - 08/29 0700 In: -  Out: 500 [Urine:500]  Physical Examination: General: alert and anxious.  Wearing sunglasses.  Emotional at times. Resp: clear to auscultation bilaterally Cardio: regular rate and rhythm, S1, S2 normal, no murmur, click, rub or gallop GI: abnormal findings:  hypoactive bowel sounds and obese, incision: lap sites with steri strips clean, dry, and intact and no tenderness or grimacing noted on palpation of the abd.  Abd tympanic.  Ecchymosis noted on the abd. Extremities: extremities normal, atraumatic, no cyanosis or edema, Homans sign is negative, no sign of DVT and no edema, redness or tenderness in the calves or thighs Musculo:  Grip strength equal and strong.  Normal  muscular strength against resistance of the upper extremities.  Able to rotate head from side to side.  Labs: WBC/Hgb/Hct/Plts:  7.4/11.9/34.7/178 (08/29 0457) BUN/Cr/glu/ALT/AST/amyl/lip:  9/0.78/--/--/--/--/-- (08/29 0457)  11/24/2011 *RADIOLOGY REPORT* Clinical Data: Abdominal pain, recent hysterectomy. ACUTE ABDOMEN SERIES (ABDOMEN 2 VIEW & CHEST 1 VIEW) Comparison: 08/27/2011 Findings: Heart size upper normal to mildly enlarged. Mild retrocardiac opacity. Nonobstructive bowel gas pattern. A small amount of free intraperitoneal air is suggested, not necessarily unexpected in the setting of recent surgery. No acute osseous finding. IMPRESSION: Nonobstructive bowel gas pattern. Small amount of free intraperitoneal air is suggested, not unexpected in the setting of recent surgery. Retrocardiac opacity; atelectasis versus infiltrate. Original Report Authenticated By: Waneta Martins, M.D.   Assessment: 56 y.o. female s/p total robotic hysterectomy, BSO on 11/23/11 : stable  Pain:  Reporting "pain all over."  Dilaudid for pain ordered.  GI:  Tolerating po: Yes.  No signs of bowel injury on above radiology report.     Prophylaxis: intermittent pneumatic compression boots.  Plan: No signs of presenting symptoms being related to potential surgical complications Muscular soreness could be related to positioning during surgery Recommend dulcolax suppository to stimulate bowels   LOS: 1 day    Katie Moch DEAL 11/25/2011, 8:33 AM

## 2011-11-25 NOTE — Progress Notes (Addendum)
[facility] PROGRESS NOTE  [name] ZOX:[id number] DOB: 01-May-1955 DOA: 11/24/2011 PCP: Dorrene German, MD  Assessment/Plan: Active Problems:  HYPOTHYROIDISM  HYPERTENSION  SPINAL STENOSIS  Endometrial ca  Depression  Pain, postoperative, acute  1.  status post robotic hysterectomy now admitted for pain control,Reporting pain all over. Refusing pain medications 2.    Headache recent admission for aseptic meningitis, recent workup included arbovirus panel was (-). Her serum quantiferon (TB screening test was +). She has not followed up with the health department as advised by Dr. Johny Sax.CSF Cx (-) x2. Both done on abx. HSV PCR negative. She was started on Valtrex during her last admission. EEG was negative. Will resume Valtrex I do not feel a repeat LP will be helpful at this time, and may only exacerbate her headache  3. asthma currently stable, will order incentive spirometry  4. Depression/anxiety continue Zoloft, start Xanax  5. Inability to walk, normal neurologic exam, PT eval ordered patient refusing all treatment offered Called by nurse that patient can now ambulate after receiving Valtrex  2.   Code Status: full Family Communication: family updated about patient's clinical progress Disposition Plan:  If workup negative will discharge tomorrow   Brief narrative: 56 year old female with endometrial cancer, she had a hysterectomy yesterday, in our short stay facility. She was discharged home today. After discharge she continued to develop worsening generalized pain, not controlled with her by mouth Dilaudid. She complains of abdominal pain, sore throat, he is in her shoulders and, neck, headache. She states she was hardly able to move. She kept calling for her mom and crying. Her mom is 11 years old, her mom eventually called 911 and brought her to the ER.   Consultants:  Obstetrics and  gynecology  Procedures:  None  Antibiotics:  Valtrex  HPI/Subjective: Patient reports pain all over and being unable to move extremities. Reporting abdominal pain when taking a deep breath. Stating having severe pain when bending her head forward. Voicing concerns that she thinks she may have meningitis again. Stating that her headache "never really went away" from her admission in July we'll start Prior to discharge on 11/24/11, pt stated that she did not want to go home because she would not be able to rest with all the animals in her house.    Objective: Filed Vitals:   11/24/11 1850 11/24/11 2158 11/25/11 0245 11/25/11 0645  BP: 102/56  97/62 112/77  Pulse: 95  88 84  Temp: 98.3 F (36.8 C) 99.9 F (37.7 C) 98 F (36.7 C) 98.1 F (36.7 C)  TempSrc: Oral Rectal Oral Oral  Resp: 18  20 20   Height:   5\' 4"  (1.626 m)   Weight:   90.7 kg (199 lb 15.3 oz)   SpO2: 96%  96% 90%    Intake/Output Summary (Last 24 hours) at 11/25/11 1040 Last data filed at 11/25/11 0900  Gross per 24 hour  Intake      0 ml  Output    925 ml  Net   -925 ml    Exam:  General: alert and anxious. Wearing sunglasses. Emotional at times.  Resp: clear to auscultation bilaterally  Cardio: regular rate and rhythm, S1, S2 normal, no murmur, click, rub or gallop  GI: abnormal findings: hypoactive bowel sounds and obese, incision: lap sites with steri strips clean, dry, and intact and no tenderness or grimacing noted on palpation of the abd. Abd tympanic. Ecchymosis noted on the abd.  Extremities: extremities normal, atraumatic,  no cyanosis or edema, Homans sign is negative, no sign of DVT and no edema, redness or tenderness in the calves or thighs  Musculo: Grip strength equal and strong. Normal muscular strength against resistance of the upper extremities. Able to rotate head from side to side.     Data Reviewed: Basic Metabolic Panel:  Lab 11/25/11 4098 11/24/11 1945 11/24/11 0420 11/19/11 1550   NA 138 135 135 135  K 4.7 3.7 4.3 4.0  CL 101 98 100 98  CO2 30 25 27 28   GLUCOSE 99 100* 144* 98  BUN 9 7 6 10   CREATININE 0.78 0.59 0.61 0.62  CALCIUM 8.9 9.0 8.8 9.9  MG -- -- -- --  PHOS -- -- -- --    Liver Function Tests:  Lab 11/24/11 1945 11/19/11 1550  AST 33 30  ALT 28 27  ALKPHOS 52 57  BILITOT 0.3 0.3  PROT 6.7 7.7  ALBUMIN 3.6 4.3   No results found for this basename: LIPASE:5,AMYLASE:5 in the last 168 hours No results found for this basename: AMMONIA:5 in the last 168 hours  CBC:  Lab 11/25/11 0457 11/24/11 1945 11/24/11 0420 11/19/11 1550  WBC 7.4 8.1 7.4 7.2  NEUTROABS -- 4.6 -- 3.4  HGB 11.9* 12.2 12.6 14.6  HCT 34.7* 35.3* 36.1 41.7  MCV 89.0 88.0 87.0 88.3  PLT 178 166 174 209    Cardiac Enzymes: No results found for this basename: CKTOTAL:5,CKMB:5,CKMBINDEX:5,TROPONINI:5 in the last 168 hours BNP (last 3 results) No results found for this basename: PROBNP:3 in the last 8760 hours   CBG: No results found for this basename: GLUCAP:5 in the last 168 hours  Recent Results (from the past 240 hour(s))  SURGICAL PCR SCREEN     Status: Normal   Collection Time   11/19/11  3:08 PM      Component Value Range Status Comment   MRSA, PCR NEGATIVE  NEGATIVE Final    Staphylococcus aureus NEGATIVE  NEGATIVE Final      Studies: Chest 2 View  11/19/2011  *RADIOLOGY REPORT*  Clinical Data: Preop for pelvic surgery.  Endometrial carcinoma.  CHEST - 2 VIEW  Comparison: 07/26/2011.  Findings: The cardiac silhouette, mediastinal and hilar contours are within normal limits and stable.  The lungs are clear.  No pleural effusion.  The bony thorax is intact.  Mild degenerative changes in the thoracic spine.  IMPRESSION: No acute cardiopulmonary findings.   Original Report Authenticated By: P. Loralie Champagne, M.D.    Dg Abd Acute W/chest  11/24/2011  *RADIOLOGY REPORT*  Clinical Data: Abdominal pain, recent hysterectomy.  ACUTE ABDOMEN SERIES (ABDOMEN 2 VIEW &  CHEST 1 VIEW)  Comparison: 08/27/2011  Findings: Heart size upper normal to mildly enlarged.  Mild retrocardiac opacity.  Nonobstructive bowel gas pattern.  A small amount of free intraperitoneal air is suggested, not necessarily unexpected in the setting of recent surgery.  No acute osseous finding.  IMPRESSION: Nonobstructive bowel gas pattern.  Small amount of free intraperitoneal air is suggested, not unexpected in the setting of recent surgery.  Retrocardiac opacity; atelectasis versus infiltrate.   Original Report Authenticated By: Waneta Martins, M.D.     Scheduled Meds:   . sodium chloride   Intravenous Once  . diphenhydrAMINE  12.5 mg Intravenous Once  . hydrochlorothiazide  12.5 mg Oral [id number]  .  HYDROmorphone (DILAUDID) injection  1 mg Intravenous Once  . ketorolac  30 mg Intravenous Once  . levothyroxine  125 mcg Oral QAC breakfast  .  LORazepam  1 mg Oral BID  . metoCLOPramide (REGLAN) injection  10 mg Intravenous Once  . pantoprazole  40 mg Oral Q1200  . sertraline  200 mg Oral Daily   Continuous Infusions:   Active Problems:  HYPOTHYROIDISM  HYPERTENSION  SPINAL STENOSIS  Endometrial ca  Depression  Pain, postoperative, acute    Time spent: 40 minutes   [facility]  [facility] Pager [phone]. If 8PM-8AM, please contact night-coverage at [email or web address], password [facility] 11/25/2011, 10:40 AM  LOS: 1 day

## 2011-11-25 NOTE — Progress Notes (Signed)
The admission note from the emergency room states that I was contacted regarding this patient. This is an error as the on call physicians spoke with [name].

## 2011-11-25 NOTE — H&P (Signed)
PCP:   Dorrene German, MD   Chief Complaint:  Pain generalized  HPI: This is a 56 year old female with endometrial cancer, she had a hysterectomy yesterday, in our short stay facility. She was discharged home today. After discharge she continued to develop worsening generalized pain, not controlled with her by mouth Dilaudid. She complains of abdominal pain, sore throat, he is in her shoulders and, neck, headache. She states she was hardly able to move. She kept calling for her mom and crying. Her mom is 31 years old, her mom eventually called 911 and brought her to the ER.  The patient was recently admitted in July at that point she was diagnosed with aseptic meningitis. She had 2 LPs done she was seen by infectious diseases and neurology. Spinal fluid cultures were negative, Quantiferon Gold was positive. The patient left precipitously in the middle of the night. The patient states she's continued to have a headache since. her headache is a part of her current complaint but it is not worse than usual.  The patient was discussed with the surgeon [name] MD who believes her current complaints do not reflects a postsurgical complication.  History obtained from patient, your physician and chart review. Patient's mother is at the bedside.  Review of Systems:  The patient denies anorexia, fever, weight loss,, vision loss, decreased hearing, hoarseness, chest pain, syncope, dyspnea on exertion, peripheral edema, balance deficits, hemoptysis, abdominal pain, melena, hematochezia, severe indigestion/heartburn, hematuria, incontinence, genital sores, muscle weakness, suspicious skin lesions, transient blindness, difficulty walking, depression, unusual weight change, abnormal bleeding, enlarged lymph nodes, angioedema, and breast masses.  Past Medical History: Past Medical History  Diagnosis Date  . Thyroid disease   . Arthritis   . Asthma   . Depression   . Fibromyalgia   . Lumbar  spondylolysis   . Allergy   . Endometrial cancer   . Spinal stenosis   . Hypertension   . Dysrhythmia     OCCAS PAC'S AND PVC'S  PER HOLTER MONITOR STUDY --PT HAS OCCAS CHEST PAINS AND PALPITATIONS--HAD ECHO NORMAL LV SIZE AND FUNCTION.  Marland Kitchen Sleep apnea     HAS CPAP MACHINE BUT DOES NOT USE  . Anxiety     HAS OCD AND ON DISABILITY-PER [name]    Past Surgical History  Procedure Date  . Cesarean section     Medications: Prior to Admission medications   Medication Sig Start Date End Date Taking? Authorizing Provider  B Complex Vitamins (VITAMIN B COMPLEX) TABS Take 1 tablet by mouth daily.   Yes Historical Provider, MD  Coenzyme Q10 (CO Q 10) 60 MG CAPS Take 1 tablet by mouth daily.   Yes Historical Provider, MD  hydrochlorothiazide (HYDRODIURIL) 12.5 MG tablet Take 12.5 mg by mouth every morning. PT JUST STARTED HCTZ FOR HER B/P TODAY 11/19/11   Yes Historical Provider, MD  HYDROmorphone (DILAUDID) 2 MG tablet Take 1 tablet (2 mg total) by mouth every 4 (four) hours as needed for pain. 11/24/11 12/04/11 Yes Melissa D Cross, NP  Lecithin 1200 MG CAPS Take 1 capsule by mouth daily.   Yes Historical Provider, MD  levothyroxine (SYNTHROID, LEVOTHROID) 125 MCG tablet Take 125 mcg by mouth daily before breakfast.    Yes Historical Provider, MD  LORazepam (ATIVAN) 1 MG tablet Take 1 mg by mouth 2 (two) times daily.  10/15/11  Yes Srikar Cherlynn Kaiser, MD  omeprazole (PRILOSEC) 20 MG capsule Take 20 mg by mouth daily.   Yes Historical Provider, MD  Selenium 200 MCG TABS Take 1 tablet by mouth daily.   Yes Historical Provider, MD  sertraline (ZOLOFT) 100 MG tablet Take 200 mg by mouth daily. TAKES AT 8:00 PM 10/15/11  Yes Cristal Ford, MD  thiothixene (NAVANE) 2 MG capsule Take 2 mg by mouth at bedtime. 10/15/11  Yes Srikar Cherlynn Kaiser, MD  vitamin C (ASCORBIC ACID) 500 MG tablet Take 500 mg by mouth 2 (two) times daily.   Yes Historical Provider, MD  vitamin E (VITAMIN E) 400 UNIT capsule Take 800 Units by  mouth daily.   Yes Historical Provider, MD  Zinc 22.5 MG TABS Take 1 tablet by mouth daily.   Yes Historical Provider, MD    Allergies:   Allergies  Allergen Reactions  . Sulfamethoxazole W-Trimethoprim Other (See Comments)    Unknown reaction    Social History:  reports that she has never smoked. She has never used smokeless tobacco. She reports that she does not drink alcohol or use illicit drugs.  Family History: Family History  Problem Relation Age of Onset  . Hypertension Mother   . Coronary artery disease Father   . Cancer Brother     Renal cell carcinoma  . Hypertension Other   . Cancer Other   . Heart attack Father   . Coronary artery disease Brother     Physical Exam: Filed Vitals:   11/24/11 1850 11/24/11 2158  BP: 102/56   Pulse: 95   Temp: 98.3 F (36.8 C) 99.9 F (37.7 C)  TempSrc: Oral Rectal  Resp: 18   SpO2: 96%     General:  Alert and oriented times three, well developed and nourished, anxious Eyes: PERRLA, pink conjunctiva, no scleral icterus ENT: Moist oral mucosa, neck supple, no thyromegaly Lungs: clear to ascultation, no wheeze, no crackles, no use of accessory muscles Cardiovascular: regular rate and rhythm, no regurgitation, no gallops, no murmurs. No carotid bruits, no JVD Abdomen: soft, positive BS, postoperative tenderness to palpation, obese, no organomegaly, GU: not examined Neuro: CN II - XII grossly intact, sensation intact, no meningeal signs. Generalized pain complaint Musculoskeletal: strength 5/5 all extremities, no clubbing, cyanosis or edema Skin: no rash, no subcutaneous crepitation, no decubitus   Labs on Admission:   [facility] 11/24/11 1945 11/24/11 0420  NA 135 135  K 3.7 4.3  CL 98 100  CO2 25 27  GLUCOSE 100* 144*  BUN 7 6  CREATININE 0.59 0.61  CALCIUM 9.0 8.8  MG -- --  PHOS -- --    Basename 11/24/11 1945  AST 33  ALT 28  ALKPHOS 52  BILITOT 0.3  PROT 6.7  ALBUMIN 3.6   No results found for this  basename: LIPASE:2,AMYLASE:2 in the last 72 hours  Basename 11/24/11 1945 11/24/11 0420  WBC 8.1 7.4  NEUTROABS 4.6 --  HGB 12.2 12.6  HCT 35.3* 36.1  MCV 88.0 87.0  PLT 166 174   No results found for this basename: CKTOTAL:3,CKMB:3,CKMBINDEX:3,TROPONINI:3 in the last 72 hours No components found with this basename: POCBNP:3 No results found for this basename: DDIMER:2 in the last 72 hours No results found for this basename: HGBA1C:2 in the last 72 hours No results found for this basename: CHOL:2,HDL:2,LDLCALC:2,TRIG:2,CHOLHDL:2,LDLDIRECT:2 in the last 72 hours No results found for this basename: TSH,T4TOTAL,FREET3,T3FREE,THYROIDAB in the last 72 hours No results found for this basename: VITAMINB12:2,FOLATE:2,FERRITIN:2,TIBC:2,IRON:2,RETICCTPCT:2 in the last 72 hours  Micro Results: Recent Results (from the past 240 hour(s))  SURGICAL PCR SCREEN     Status: Normal  Collection Time   11/19/11  3:08 PM      Component Value Range Status Comment   MRSA, PCR NEGATIVE  NEGATIVE Final    Staphylococcus aureus NEGATIVE  NEGATIVE Final      Radiological Exams on Admission: Dg Abd Acute W/chest  11/24/2011  *RADIOLOGY REPORT*  Clinical Data: Abdominal pain, recent hysterectomy.  ACUTE ABDOMEN SERIES (ABDOMEN 2 VIEW & CHEST 1 VIEW)  Comparison: 08/27/2011  Findings: Heart size upper normal to mildly enlarged.  Mild retrocardiac opacity.  Nonobstructive bowel gas pattern.  A small amount of free intraperitoneal air is suggested, not necessarily unexpected in the setting of recent surgery.  No acute osseous finding.  IMPRESSION: Nonobstructive bowel gas pattern.  Small amount of free intraperitoneal air is suggested, not unexpected in the setting of recent surgery.  Retrocardiac opacity; atelectasis versus infiltrate.   Original Report Authenticated By: Waneta Martins, M.D.     From prior admission Results for SHENAYA, LEBO (MRN [id number]) as of 11/25/2011 00:19  Ref. Range 10/10/2011  05:14 10/10/2011 05:15 10/10/2011 05:15 10/13/2011 15:20  Glucose, CSF Latest Range: 43-76 mg/dL 54   46  Total  Protein, CSF Latest Range: 15-45 mg/dL 010 (H)   73 (H)  RBC Count, CSF Latest Range: 0 /cu mm  56 (H) 4 (H) 3 (H)  WBC, CSF Latest Range: 0-5 /cu mm  1405 (HH) 1160 (HH) 710 (HH)  Segmented Neutrophils-CSF Latest Range: 0-6 %  0 0 0  Lymphs, CSF Latest Range: 40-80 %  99 (H) 99 (H) 97 (H)  Monocyte-Macrophage-Spinal Fluid Latest Range: 15-45 %  1 (L) 1 (L) 3 (L)  Eosinophils, CSF Latest Range: 0-1 %  0 0 0  Appearance, CSF Latest Range: CLEAR   CLOUDY (A) CLOUDY (A) CLEAR (A)  Color, CSF Latest Range: COLORLESS   COLORLESS COLORLESS COLORLESS  Supernatant No range found  NOT INDICATED NOT INDICATED NOT INDICATED  Tube # No range found  1 4 1   Results for GLENDALE, WHERRY (MRN [id number]) as of 11/25/2011 00:19  Ref. Range 10/14/2011 13:00  Interferon Gamma Release Assay No range found POSITIVE (A)    Assessment/Plan Present on Admission:  .Pain, postoperative, acute 23 hour observations As needed Pain medications  Question drug-seeking behavior  Recent admission in July with aseptic meningitis  Patient was positive for Quantiferon gold  Patient left hospital precipitously during the night, therefore appropriate discharge planning was not possible. she has not followed up with ID or neurology. She still has the same headache.would recommend daytime team contact ID to see if anything further was needed in followup. Status post hysterectomy  Endometrial cancer  Stable followup with OB outpatient  .HYPERTENSION . fibromyalgia  Anxiety and depression  .HYPOTHYROIDISM .SPINAL STENOSIS resume home medications   SCDs Full code   Lamin Chandley 11/25/2011, 12:16 AM

## 2011-11-25 NOTE — Progress Notes (Signed)
Patient complained of not able to move extremities this am. Patient said that she had generalized pain. While walking through hallway, patient was standing in her room and screamed out to RN "It's a miracle! I can walk!" RN notified MD of findings. No new orders.  Will continue to monitor patient. Setzer, Don Broach

## 2011-11-25 NOTE — ED Provider Notes (Signed)
Medical screening examination/treatment/procedure(s) were conducted as a shared visit with non-physician practitioner(s) and myself.  I personally evaluated the patient during the encounter  [name] is a 56 y.o. female with hx of fibromyalgia and hysterectomy yesterday here with abdominal pain, headache, neck pain, shoulder pain. She just left the hospital after surgery but her pain is still there. Denies fevers. Recently she had aseptic meningitis.   She is tender throughout her head, neck, and abdomen. Labs normal, UA normal. Abdomen xray showed minimal free air, expected after surgery She had chronic pain issues in the past. She was given dilaudid and toradol without relief. I discussed with [facility] hospitalist regarding admission for pain control. We also called her OB who did the surgery and is aware that she is here.    Richardean Canal, MD 11/25/11 [phone]

## 2011-11-25 NOTE — Progress Notes (Signed)
PHYSICAL THERAPY SCREEN. Pt observed getting up out of bed, ambulating in room without difficulty. No acute PT needs at this time. Blanchard Kelch PT [phone]

## 2011-11-25 NOTE — Care Management Note (Signed)
    Page 1 of 1   11/26/2011     1:01:10 PM   CARE MANAGEMENT NOTE 11/26/2011  Patient:  [name],[name]   Account Number:  [id number]  Date Initiated:  11/25/2011  Documentation initiated by:  [facility]  Subjective/Objective Assessment:   ADMITTED W/GEN'L PAIN.HX: ENDOMETRIAL CA,HYSTERECTOMY.     Action/Plan:   FROM HOME   Anticipated DC Date:  11/26/2011   Anticipated DC Plan:           Choice offered to / List presented to:             Status of service:  Completed, signed off Medicare Important Message given?   (If response is "NO", the following Medicare IM given date fields will be blank) Date Medicare IM given:   Date Additional Medicare IM given:    Discharge Disposition:  HOME/SELF CARE  Per UR Regulation:  Reviewed for med. necessity/level of care/duration of stay  If discussed at Long Length of Stay Meetings, dates discussed:    Comments:  11/25/11 [facility] RN,BSN NCM [phone]

## 2011-11-26 LAB — URINE CULTURE: Colony Count: NO GROWTH

## 2011-11-26 MED ORDER — ALPRAZOLAM 0.5 MG PO TABS: 0.5000 mg | ORAL_TABLET | Freq: Two times a day (BID) | ORAL | Status: AC | PRN

## 2011-11-26 MED ORDER — AZITHROMYCIN 500 MG PO TABS: 500.0000 mg | ORAL_TABLET | Freq: Every day | ORAL | Status: AC

## 2011-11-26 MED ORDER — VALACYCLOVIR HCL 1 G PO TABS: 1000.0000 mg | ORAL_TABLET | Freq: Two times a day (BID) | ORAL | Status: AC

## 2011-11-26 MED ORDER — ALBUTEROL SULFATE HFA 108 (90 BASE) MCG/ACT IN AERS: 2.0000 | INHALATION_SPRAY | Freq: Four times a day (QID) | RESPIRATORY_TRACT | Status: DC | PRN

## 2011-11-26 NOTE — Discharge Summary (Signed)
Physician Discharge Summary  [name] MRN: [id number] DOB/AGE: 06-15-1955 56 y.o.  PCP: Dorrene German, MD   Admit date: 11/24/2011 Discharge date: 11/26/2011  Discharge Diagnoses:  Asthma exacerbation  HYPOTHYROIDISM  HYPERTENSION  SPINAL STENOSIS  Endometrial ca  Depression  Pain, postoperative, acute   Medication List  As of 11/26/2011 12:15 PM   TAKE these medications         albuterol 108 (90 BASE) MCG/ACT inhaler   Commonly known as: PROVENTIL HFA;VENTOLIN HFA   Inhale 2 puffs into the lungs every 6 (six) hours as needed for wheezing.      ALPRAZolam 0.5 MG tablet   Commonly known as: XANAX   Take 1 tablet (0.5 mg total) by mouth 2 (two) times daily as needed for anxiety.      azithromycin 500 MG tablet   Commonly known as: ZITHROMAX   Take 1 tablet (500 mg total) by mouth daily.      Co Q 10 60 MG Caps   Take 1 tablet by mouth daily.      hydrochlorothiazide 12.5 MG tablet   Commonly known as: HYDRODIURIL   Take 12.5 mg by mouth every morning. PT JUST STARTED HCTZ FOR HER B/P TODAY 11/19/11      HYDROmorphone 2 MG tablet   Commonly known as: DILAUDID   Take 1 tablet (2 mg total) by mouth every 4 (four) hours as needed for pain.      Lecithin 1200 MG Caps   Take 1 capsule by mouth daily.      levothyroxine 125 MCG tablet   Commonly known as: SYNTHROID, LEVOTHROID   Take 125 mcg by mouth daily before breakfast.      LORazepam 1 MG tablet   Commonly known as: ATIVAN   Take 1 mg by mouth 2 (two) times daily.      omeprazole 20 MG capsule   Commonly known as: PRILOSEC   Take 20 mg by mouth daily.      Selenium 200 MCG Tabs   Take 1 tablet by mouth daily.      sertraline 100 MG tablet   Commonly known as: ZOLOFT   Take 200 mg by mouth daily. TAKES AT 8:00 PM      thiothixene 2 MG capsule   Commonly known as: NAVANE   Take 2 mg by mouth at bedtime.      valACYclovir 1000 MG tablet   Commonly known as: VALTREX   Take 1 tablet  (1,000 mg total) by mouth 2 (two) times daily.      Vitamin B Complex Tabs   Take 1 tablet by mouth daily.      vitamin C 500 MG tablet   Commonly known as: ASCORBIC ACID   Take 500 mg by mouth 2 (two) times daily.      vitamin E 400 UNIT capsule   Generic drug: vitamin E   Take 800 Units by mouth daily.      Zinc 22.5 MG Tabs   Take 1 tablet by mouth daily.            Discharge Condition: Stable Disposition: 01-Home or Self Care   Consults: None  Significant Diagnostic Studies: Chest 2 View  11/19/2011  *RADIOLOGY REPORT*  Clinical Data: Preop for pelvic surgery.  Endometrial carcinoma.  CHEST - 2 VIEW  Comparison: 07/26/2011.  Findings: The cardiac silhouette, mediastinal and hilar contours are within normal limits and stable.  The lungs are clear.  No pleural effusion.  The bony thorax is intact.  Mild degenerative changes in the thoracic spine.  IMPRESSION: No acute cardiopulmonary findings.   Original Report Authenticated By: P. Loralie Champagne, M.D.    Ct Head W Wo Contrast  11/25/2011  *RADIOLOGY REPORT*  Clinical Data: Headache.  Possible meningitis.  Endometrial cancer.  CT HEAD WITHOUT AND WITH CONTRAST  Technique:  Contiguous axial images were obtained from the base of the skull through the vertex without and with intravenous contrast.  Contrast: OMNIPAQUE IOHEXOL 300 MG/ML  SOLN  Comparison: CT and MRI 10/10/2011  Findings: Small hypodensity left frontal temporal white matter is unchanged.  Mild hypodensity in the left frontal deep white matter is unchanged.  These areas do not enhance.  They are most likely due to chronic microvascular ischemia.  Negative for acute infarct.  Negative for intracranial hemorrhage or mass.  Ventricle size is normal and there is no midline shift.  Postcontrast imaging reveals normal enhancement.  No enhancing mass is present.  No abnormal dural enhancement is present.  Calvarium intact.  IMPRESSION: Small white matter hypodensities on  the left are stable from the prior MRI.  No acute infarct or abnormal enhancement.   Original Report Authenticated By: Camelia Phenes, M.D.    Dg Abd Acute W/chest  11/24/2011  *RADIOLOGY REPORT*  Clinical Data: Abdominal pain, recent hysterectomy.  ACUTE ABDOMEN SERIES (ABDOMEN 2 VIEW & CHEST 1 VIEW)  Comparison: 08/27/2011  Findings: Heart size upper normal to mildly enlarged.  Mild retrocardiac opacity.  Nonobstructive bowel gas pattern.  A small amount of free intraperitoneal air is suggested, not necessarily unexpected in the setting of recent surgery.  No acute osseous finding.  IMPRESSION: Nonobstructive bowel gas pattern.  Small amount of free intraperitoneal air is suggested, not unexpected in the setting of recent surgery.  Retrocardiac opacity; atelectasis versus infiltrate.   Original Report Authenticated By: Waneta Martins, M.D.       Microbiology: Recent Results (from the past 240 hour(s))  SURGICAL PCR SCREEN     Status: Normal   Collection Time   11/19/11  3:08 PM      Component Value Range Status Comment   MRSA, PCR NEGATIVE  NEGATIVE Final    Staphylococcus aureus NEGATIVE  NEGATIVE Final   URINE CULTURE     Status: Normal   Collection Time   11/24/11  8:25 PM      Component Value Range Status Comment   Specimen Description URINE, RANDOM   Final    Special Requests NONE   Final    Culture  Setup Time 11/25/2011 01:49   Final    Colony Count NO GROWTH   Final    Culture NO GROWTH   Final    Report Status 11/26/2011 FINAL   Final      Labs: Results for orders placed during the hospital encounter of 11/24/11 (from the past 48 hour(s))  CBC WITH DIFFERENTIAL     Status: Abnormal   Collection Time   11/24/11  7:45 PM      Component Value Range Comment   WBC 8.1  4.0 - 10.5 K/uL    RBC 4.01  3.87 - 5.11 MIL/uL    Hemoglobin 12.2  12.0 - 15.0 g/dL    HCT 40.9 (*) 81.1 - 46.0 %    MCV 88.0  78.0 - 100.0 fL    MCH 30.4  26.0 - 34.0 pg    MCHC 34.6  30.0 - 36.0 g/dL  RDW 13.3  11.5 - 15.5 %    Platelets 166  150 - 400 K/uL    Neutrophils Relative 57  43 - 77 %    Neutro Abs 4.6  1.7 - 7.7 K/uL    Lymphocytes Relative 29  12 - 46 %    Lymphs Abs 2.4  0.7 - 4.0 K/uL    Monocytes Relative 12  3 - 12 %    Monocytes Absolute 0.9  0.1 - 1.0 K/uL    Eosinophils Relative 2  0 - 5 %    Eosinophils Absolute 0.1  0.0 - 0.7 K/uL    Basophils Relative 0  0 - 1 %    Basophils Absolute 0.0  0.0 - 0.1 K/uL   COMPREHENSIVE METABOLIC PANEL     Status: Abnormal   Collection Time   11/24/11  7:45 PM      Component Value Range Comment   Sodium 135  135 - 145 mEq/L    Potassium 3.7  3.5 - 5.1 mEq/L    Chloride 98  96 - 112 mEq/L    CO2 25  19 - 32 mEq/L    Glucose, Bld 100 (*) 70 - 99 mg/dL    BUN 7  6 - 23 mg/dL    Creatinine, Ser 0.98  0.50 - 1.10 mg/dL    Calcium 9.0  8.4 - 11.9 mg/dL    Total Protein 6.7  6.0 - 8.3 g/dL    Albumin 3.6  3.5 - 5.2 g/dL    AST 33  0 - 37 U/L    ALT 28  0 - 35 U/L    Alkaline Phosphatase 52  39 - 117 U/L    Total Bilirubin 0.3  0.3 - 1.2 mg/dL    GFR calc non Af Amer >90  >90 mL/min    GFR calc Af Amer >90  >90 mL/min   URINALYSIS, ROUTINE W REFLEX MICROSCOPIC     Status: Abnormal   Collection Time   11/24/11  8:17 PM      Component Value Range Comment   Color, Urine YELLOW  YELLOW    APPearance CLOUDY (*) CLEAR    Specific Gravity, Urine 1.012  1.005 - 1.030    pH 7.0  5.0 - 8.0    Glucose, UA NEGATIVE  NEGATIVE mg/dL    Hgb urine dipstick TRACE (*) NEGATIVE    Bilirubin Urine NEGATIVE  NEGATIVE    Ketones, ur NEGATIVE  NEGATIVE mg/dL    Protein, ur NEGATIVE  NEGATIVE mg/dL    Urobilinogen, UA 0.2  0.0 - 1.0 mg/dL    Nitrite NEGATIVE  NEGATIVE    Leukocytes, UA TRACE (*) NEGATIVE   URINE MICROSCOPIC-ADD ON     Status: Normal   Collection Time   11/24/11  8:17 PM      Component Value Range Comment   Squamous Epithelial / LPF RARE  RARE    WBC, UA 3-6  <3 WBC/hpf    RBC / HPF 3-6  <3 RBC/hpf    Bacteria, UA RARE   RARE   URINE CULTURE     Status: Normal   Collection Time   11/24/11  8:25 PM      Component Value Range Comment   Specimen Description URINE, RANDOM      Special Requests NONE      Culture  Setup Time 11/25/2011 01:49      Colony Count NO GROWTH      Culture NO GROWTH  Report Status 11/26/2011 FINAL     BASIC METABOLIC PANEL     Status: Normal   Collection Time   11/25/11  4:57 AM      Component Value Range Comment   Sodium 138  135 - 145 mEq/L    Potassium 4.7  3.5 - 5.1 mEq/L    Chloride 101  96 - 112 mEq/L    CO2 30  19 - 32 mEq/L    Glucose, Bld 99  70 - 99 mg/dL    BUN 9  6 - 23 mg/dL    Creatinine, Ser 2.13  0.50 - 1.10 mg/dL    Calcium 8.9  8.4 - 08.6 mg/dL    GFR calc non Af Amer >90  >90 mL/min    GFR calc Af Amer >90  >90 mL/min   CBC     Status: Abnormal   Collection Time   11/25/11  4:57 AM      Component Value Range Comment   WBC 7.4  4.0 - 10.5 K/uL WHITE COUNT CONFIRMED ON SMEAR   RBC 3.90  3.87 - 5.11 MIL/uL    Hemoglobin 11.9 (*) 12.0 - 15.0 g/dL    HCT 57.8 (*) 46.9 - 46.0 %    MCV 89.0  78.0 - 100.0 fL    MCH 30.5  26.0 - 34.0 pg    MCHC 34.3  30.0 - 36.0 g/dL    RDW 62.9  52.8 - 41.3 %    Platelets 178  150 - 400 K/uL      HPI :56 year old female with endometrial cancer, she had a hysterectomy 8/27, in our short stay facility.  Patient reports pain all over and being unable to move extremities. Reporting abdominal pain when taking a deep breath. Stating having severe pain when bending her head forward. Voicing concerns that she thinks she may have meningitis again. Stating that her headache "never really went away" from her admission in July. Last PO intake on 11/25/11 at breakfast. Stating that she is "not really" passing flatus this am. Reporting last episode of flatus yesterday prior to discharge. Reporting "I am having to use the bedpan because I cannot move." Denies having a bowel movement. Denies nausea, vomiting, chest pain, and shortness of  breath. Prior to discharge on 11/24/11, pt stated that she did not want to go home because she would not be able to rest with all the animals in her house . After discharge she continued to develop worsening generalized pain, not controlled with her by mouth Dilaudid.  She kept calling for her mom and crying. Her mom is 80 years old, her mom eventually called 911 and brought her to the ER.  The patient was recently admitted in July at that point she was diagnosed with aseptic meningitis. She had 2 LPs done she was seen by infectious diseases and neurology. Spinal fluid cultures were negative, Quantiferon Gold was positive. The patient left precipitously in the middle of the night. The patient states she's continued to have a headache since. her headache is a part of her current complaint but it is not worse than usual.  The patient was discussed with the surgeon [name] MD who believes her current complaints do not reflects a postsurgical complication.  History obtained from patient, your physician and chart review. Patient's mother is at the bedside   HOSPITAL COURSE:  status post robotic hysterectomy now admitted for pain control,Reporting pain all over. Gynecology was consulted, and the stated that the signs of  presenting symptoms were not related to surgical complications. The patient had muscular soreness related to positioning during surgery. Her neurologic exam was normal and there were no gross focal neurologic deficits    2. Headache recent admission for aseptic meningitis, recent workup included arbovirus panel was (-). Her serum quantiferon (TB screening test was +). She has not followed up with the health department as advised by Dr. Johny Sax.CSF Cx (-) x2. Both done on abx. HSV PCR negative. She was started on Valtrex during her last admission as the patient had been noncompliant with her last regimen. EEG during her last admission was negative. Valtrex was resumed and repeat  lumbar puncture was not done as it was thought that this would exacerbate her headaches  3. asthma currently stable, will order incentive spirometry   4. Depression/anxiety continue Zoloft, started Xanax   5. Inability to walk, normal neurologic exam, PT eval ordered patient refusing all treatment offered  Patient miraculously started walking after receiving Xanax and Valtrex  6. asthma exacerbation patient started wheezing and complaining off sore throat and productive cough She was provided with a prescription for albuterol as well as azithromycin for 5 days She was found to be 93% before discharge on room air   Discharge Exam:  Blood pressure 107/72, pulse 77, temperature 98.3 F (36.8 C), temperature source Oral, resp. rate 18, height 5\' 4"  (1.626 m), weight 90.7 kg (199 lb 15.3 oz), SpO2 92.00%.  General: alert and anxious. Wearing sunglasses. Emotional at times.  Resp: clear to auscultation bilaterally  Cardio: regular rate and rhythm, S1, S2 normal, no murmur, click, rub or gallop  GI: abnormal findings: hypoactive bowel sounds and obese, incision: lap sites with steri strips clean, dry, and intact and no tenderness or grimacing noted on palpation of the abd. Abd tympanic. Ecchymosis noted on the abd.  Extremities: extremities normal, atraumatic, no cyanosis or edema, Homans sign is negative, no sign of DVT and no edema, redness or tenderness in the calves or thighs  Musculo: Grip strength equal and strong. Normal muscular strength against resistance of the upper extremities. Able to rotate head from side to side.    Discharge Orders    Future Orders Please Complete By Expires   Diet - low sodium heart healthy      Increase activity slowly         Follow-up Information    Follow up with AVBUERE,EDWIN A, MD. Schedule an appointment as soon as possible for a visit in 1 week.   Contact information:   [address] [place] [address] [phone]            Signed: Richarda Overlie 11/26/2011, 12:15 PM

## 2011-11-26 NOTE — Progress Notes (Signed)
Patient discharge to home, complained of mild pain 3/10 prior to discharge PRN meds given. All d/c instruction and follow up appointment done and was given to patient, verbalized understanding.

## 2011-11-27 NOTE — ED Provider Notes (Signed)
Medical screening examination/treatment/procedure(s) were performed by non-physician practitioner and as supervising physician I was immediately available for consultation/collaboration.  Jonea Bukowski R. Samone Guhl, MD 11/27/11 0657 

## 2012-01-04 ENCOUNTER — Telehealth: Payer: Self-pay | Admitting: Gynecologic Oncology

## 2012-01-04 NOTE — Telephone Encounter (Signed)
Pt returning call to the clinic about upcoming appointment tomorrow with Dr. Duard Brady.  Pt stating "I am not doing good.  I have bronchitis.  Had it for several weeks.  I took a z-pack but I am not getting better.  I am not going to be able to make it for my appointment.  I can't."  When asked about rescheduling, pt refusing to reschedule stating that she will call the office to reschedule.  Informed of the importance of keeping her appointments and following up.  No concerns or questions voiced.  Instructed to please call and reschedule as soon as possible.

## 2012-01-05 ENCOUNTER — Ambulatory Visit: Payer: Medicaid Other | Admitting: Gynecologic Oncology

## 2012-02-23 ENCOUNTER — Encounter (HOSPITAL_COMMUNITY): Payer: Self-pay | Admitting: Emergency Medicine

## 2012-02-23 ENCOUNTER — Emergency Department (HOSPITAL_COMMUNITY)
Admission: EM | Admit: 2012-02-23 | Discharge: 2012-02-23 | Disposition: A | Discharge status: Home / Self Care | Payer: Medicaid Other | Attending: Emergency Medicine | Admitting: Emergency Medicine

## 2012-02-23 DIAGNOSIS — I10 Essential (primary) hypertension: Secondary | ICD-10-CM | POA: Insufficient documentation

## 2012-02-23 DIAGNOSIS — Z8639 Personal history of other endocrine, nutritional and metabolic disease: Secondary | ICD-10-CM

## 2012-02-23 DIAGNOSIS — Q762 Congenital spondylolisthesis: Secondary | ICD-10-CM | POA: Insufficient documentation

## 2012-02-23 DIAGNOSIS — M4800 Spinal stenosis, site unspecified: Secondary | ICD-10-CM | POA: Insufficient documentation

## 2012-02-23 DIAGNOSIS — J45909 Unspecified asthma, uncomplicated: Secondary | ICD-10-CM | POA: Insufficient documentation

## 2012-02-23 DIAGNOSIS — F411 Generalized anxiety disorder: Secondary | ICD-10-CM | POA: Insufficient documentation

## 2012-02-23 DIAGNOSIS — E039 Hypothyroidism, unspecified: Secondary | ICD-10-CM | POA: Insufficient documentation

## 2012-02-23 DIAGNOSIS — F329 Major depressive disorder, single episode, unspecified: Secondary | ICD-10-CM | POA: Insufficient documentation

## 2012-02-23 DIAGNOSIS — G4730 Sleep apnea, unspecified: Secondary | ICD-10-CM | POA: Insufficient documentation

## 2012-02-23 DIAGNOSIS — Z8542 Personal history of malignant neoplasm of other parts of uterus: Secondary | ICD-10-CM | POA: Insufficient documentation

## 2012-02-23 DIAGNOSIS — Z8659 Personal history of other mental and behavioral disorders: Secondary | ICD-10-CM

## 2012-02-23 DIAGNOSIS — IMO0001 Reserved for inherently not codable concepts without codable children: Secondary | ICD-10-CM | POA: Insufficient documentation

## 2012-02-23 DIAGNOSIS — Z8739 Personal history of other diseases of the musculoskeletal system and connective tissue: Secondary | ICD-10-CM | POA: Insufficient documentation

## 2012-02-23 DIAGNOSIS — F3289 Other specified depressive episodes: Secondary | ICD-10-CM | POA: Insufficient documentation

## 2012-02-23 MED ORDER — THIOTHIXENE 2 MG PO CAPS: 2.0000 mg | ORAL_CAPSULE | Freq: Every day | ORAL | Status: DC

## 2012-02-23 MED ORDER — LORAZEPAM 1 MG PO TABS: 1.0000 mg | ORAL_TABLET | Freq: Two times a day (BID) | ORAL | Status: DC

## 2012-02-23 MED ORDER — LEVOTHYROXINE SODIUM 125 MCG PO TABS: 125.0000 ug | ORAL_TABLET | Freq: Every day | ORAL | Status: DC

## 2012-02-23 NOTE — ED Notes (Signed)
Pt waiting to see counselor on Monday at the [facility] and to be referred to a doctor and pt is out of meds.

## 2012-02-23 NOTE — ED Provider Notes (Signed)
History     CSN: [id number]  Arrival date & time 02/23/12  1706   First MD Initiated Contact with Patient 02/23/12 1720      Chief Complaint  Patient presents with  . Medication Refill    (Consider location/radiation/quality/duration/timing/severity/associated sxs/prior treatment) HPI Comments: The patient is here requesting medication refills. She has no complaints that need evaluation. She reports that she is currently in the process of establishing with a physician in [place] after a recent move for further refills.  The history is provided by the patient.    Past Medical History  Diagnosis Date  . Thyroid disease   . Arthritis   . Asthma   . Depression   . Fibromyalgia   . Lumbar spondylolysis   . Allergy   . Endometrial cancer   . Spinal stenosis   . Hypertension   . Dysrhythmia     OCCAS PAC'S AND PVC'S  PER HOLTER MONITOR STUDY --PT HAS OCCAS CHEST PAINS AND PALPITATIONS--HAD ECHO NORMAL LV SIZE AND FUNCTION.  Marland Kitchen Sleep apnea     HAS CPAP MACHINE BUT DOES NOT USE  . Anxiety     HAS OCD AND ON DISABILITY-PER [name]     Past Surgical History  Procedure Date  . Cesarean section     Family History  Problem Relation Age of Onset  . Hypertension Mother   . Coronary artery disease Father   . Cancer Brother     Renal cell carcinoma  . Hypertension Other   . Cancer Other   . Heart attack Father   . Coronary artery disease Brother     History  Substance Use Topics  . Smoking status: Never Smoker   . Smokeless tobacco: Never Used  . Alcohol Use: No    OB History    Grav Para Term Preterm Abortions TAB SAB Ect Mult Living   1 1              Review of Systems  Constitutional: Negative for fever and chills.  Respiratory: Negative.   Cardiovascular: Negative.   Gastrointestinal: Negative.   Musculoskeletal: Negative.   Skin: Negative.   Neurological: Negative.   Psychiatric/Behavioral: Negative.     Allergies  Sulfamethoxazole  w-trimethoprim  Home Medications   Current Outpatient Rx  Name  Route  Sig  Dispense  Refill  . ALBUTEROL SULFATE HFA 108 (90 BASE) MCG/ACT IN AERS   Inhalation   Inhale 2 puffs into the lungs every 6 (six) hours as needed. Wheezing         . VITAMIN B COMPLEX PO TABS   Oral   Take 1 tablet by mouth daily.         . CO Q 10 60 MG PO CAPS   Oral   Take 1 tablet by mouth daily.         Marland Kitchen HYDROCHLOROTHIAZIDE 12.5 MG PO TABS   Oral   Take 12.5 mg by mouth every morning. PT JUST STARTED HCTZ FOR HER B/P TODAY 11/19/11         . LECITHIN 1200 MG PO CAPS   Oral   Take 1 capsule by mouth daily.         Marland Kitchen LEVOTHYROXINE SODIUM 125 MCG PO TABS   Oral   Take 125 mcg by mouth daily before breakfast.          . LORAZEPAM 1 MG PO TABS   Oral   Take 1 mg by mouth 2 (two) times daily.          Marland Kitchen  OMEPRAZOLE 20 MG PO CPDR   Oral   Take 20 mg by mouth daily.         . SELENIUM 200 MCG PO TABS   Oral   Take 1 tablet by mouth daily.         . SERTRALINE HCL 100 MG PO TABS   Oral   Take 200 mg by mouth daily. TAKES AT 8:00 PM         . THIOTHIXENE 2 MG PO CAPS   Oral   Take 2 mg by mouth at bedtime.         Marland Kitchen VITAMIN C 500 MG PO TABS   Oral   Take 500 mg by mouth 2 (two) times daily.         Marland Kitchen VITAMIN D (ERGOCALCIFEROL) 50000 UNITS PO CAPS   Oral   Take 50,000 Units by mouth every 7 (seven) days.         Marland Kitchen VITAMIN E 400 UNITS PO CAPS   Oral   Take 800 Units by mouth daily.         Marland Kitchen ZINC 22.5 MG PO TABS   Oral   Take 1 tablet by mouth daily.           BP 157/99  Pulse 100  Temp 98.9 F (37.2 C) (Oral)  Resp 18  SpO2 100%  Physical Exam  Constitutional: She is oriented to person, place, and time. She appears well-developed and well-nourished.  HENT:  Head: Normocephalic.  Neck: Normal range of motion. Neck supple.  Cardiovascular: Normal rate and regular rhythm.   Pulmonary/Chest: Effort normal and breath sounds normal.    Abdominal: Soft. Bowel sounds are normal. There is no tenderness. There is no rebound and no guarding.  Musculoskeletal: Normal range of motion.  Neurological: She is alert and oriented to person, place, and time.  Skin: Skin is warm and dry. No rash noted.  Psychiatric: She has a normal mood and affect. Her behavior is normal. Judgment and thought content normal.    ED Course  Procedures (including critical care time)  Labs Reviewed - No data to display No results found.   No diagnosis found.  1. History of hypothyroid 2. History of depression  MDM  Discussed importance of having regular physician for ongoing medications.         Rodena Medin, PA-C 02/23/12 [phone]

## 2012-02-23 NOTE — ED Notes (Signed)
Pt states that she wants a medication refill of ativan, thiothyxine, and synthroid.

## 2012-02-24 NOTE — ED Provider Notes (Signed)
Medical screening examination/treatment/procedure(s) were performed by non-physician practitioner and as supervising physician I was immediately available for consultation/collaboration.  Rondey Fallen, MD 02/24/12 0104 

## 2012-03-07 ENCOUNTER — Encounter (HOSPITAL_COMMUNITY): Payer: Self-pay | Admitting: Emergency Medicine

## 2012-03-07 ENCOUNTER — Emergency Department (HOSPITAL_COMMUNITY)
Admission: EM | Admit: 2012-03-07 | Discharge: 2012-03-07 | Disposition: A | Discharge status: Home / Self Care | Payer: Medicaid Other | Attending: Emergency Medicine | Admitting: Emergency Medicine

## 2012-03-07 DIAGNOSIS — F329 Major depressive disorder, single episode, unspecified: Secondary | ICD-10-CM | POA: Insufficient documentation

## 2012-03-07 DIAGNOSIS — I10 Essential (primary) hypertension: Secondary | ICD-10-CM | POA: Insufficient documentation

## 2012-03-07 DIAGNOSIS — F411 Generalized anxiety disorder: Secondary | ICD-10-CM | POA: Insufficient documentation

## 2012-03-07 DIAGNOSIS — IMO0001 Reserved for inherently not codable concepts without codable children: Secondary | ICD-10-CM | POA: Insufficient documentation

## 2012-03-07 DIAGNOSIS — Z9109 Other allergy status, other than to drugs and biological substances: Secondary | ICD-10-CM | POA: Insufficient documentation

## 2012-03-07 DIAGNOSIS — Z79899 Other long term (current) drug therapy: Secondary | ICD-10-CM | POA: Insufficient documentation

## 2012-03-07 DIAGNOSIS — M4310 Spondylolisthesis, site unspecified: Secondary | ICD-10-CM | POA: Insufficient documentation

## 2012-03-07 DIAGNOSIS — M4800 Spinal stenosis, site unspecified: Secondary | ICD-10-CM | POA: Insufficient documentation

## 2012-03-07 DIAGNOSIS — J45909 Unspecified asthma, uncomplicated: Secondary | ICD-10-CM | POA: Insufficient documentation

## 2012-03-07 DIAGNOSIS — Z8679 Personal history of other diseases of the circulatory system: Secondary | ICD-10-CM | POA: Insufficient documentation

## 2012-03-07 DIAGNOSIS — F429 Obsessive-compulsive disorder, unspecified: Secondary | ICD-10-CM | POA: Insufficient documentation

## 2012-03-07 DIAGNOSIS — E079 Disorder of thyroid, unspecified: Secondary | ICD-10-CM | POA: Insufficient documentation

## 2012-03-07 DIAGNOSIS — M129 Arthropathy, unspecified: Secondary | ICD-10-CM | POA: Insufficient documentation

## 2012-03-07 DIAGNOSIS — Z760 Encounter for issue of repeat prescription: Secondary | ICD-10-CM | POA: Insufficient documentation

## 2012-03-07 DIAGNOSIS — G4730 Sleep apnea, unspecified: Secondary | ICD-10-CM | POA: Insufficient documentation

## 2012-03-07 DIAGNOSIS — F3289 Other specified depressive episodes: Secondary | ICD-10-CM | POA: Insufficient documentation

## 2012-03-07 DIAGNOSIS — Z8739 Personal history of other diseases of the musculoskeletal system and connective tissue: Secondary | ICD-10-CM | POA: Insufficient documentation

## 2012-03-07 DIAGNOSIS — C549 Malignant neoplasm of corpus uteri, unspecified: Secondary | ICD-10-CM | POA: Insufficient documentation

## 2012-03-07 DIAGNOSIS — Z8542 Personal history of malignant neoplasm of other parts of uterus: Secondary | ICD-10-CM | POA: Insufficient documentation

## 2012-03-07 MED ORDER — LORAZEPAM 1 MG PO TABS: 1.0000 mg | ORAL_TABLET | Freq: Two times a day (BID) | ORAL | Status: DC

## 2012-03-07 MED ORDER — THIOTHIXENE 2 MG PO CAPS: 2.0000 mg | ORAL_CAPSULE | Freq: Every day | ORAL | Status: DC

## 2012-03-07 NOTE — ED Provider Notes (Signed)
Medical screening examination/treatment/procedure(s) were performed by non-physician practitioner and as supervising physician I was immediately available for consultation/collaboration.  Lamis Behrmann, MD 03/07/12 1931 

## 2012-03-07 NOTE — ED Notes (Signed)
Pt presenting to ed with c/o medication refill on her lorazepam and thiothixine. Pt states she has an appointment to see her doctor on 03/15/12 but she can't wait until then she need refills

## 2012-03-07 NOTE — ED Provider Notes (Signed)
History     CSN: [id number]  Arrival date & time 03/07/12  1608   First MD Initiated Contact with Patient 03/07/12 1723      Chief Complaint  Patient presents with  . Medication Refill    (Consider location/radiation/quality/duration/timing/severity/associated sxs/prior treatment) HPI Comments: Patient presents for medication refills, specifically Ativan and Navane. Patient states that she was dismissed from her primary care physician practice. She states that she has a counselor who will see her 12/18. She states that she will withdrawal and die if she is not prescribed these medications. She denies other medical complaints.   The history is provided by the patient.    Past Medical History  Diagnosis Date  . Thyroid disease   . Arthritis   . Asthma   . Depression   . Fibromyalgia   . Lumbar spondylolysis   . Allergy   . Endometrial cancer   . Spinal stenosis   . Hypertension   . Dysrhythmia     OCCAS PAC'S AND PVC'S  PER HOLTER MONITOR STUDY --PT HAS OCCAS CHEST PAINS AND PALPITATIONS--HAD ECHO NORMAL LV SIZE AND FUNCTION.  Marland Kitchen Sleep apnea     HAS CPAP MACHINE BUT DOES NOT USE  . Anxiety     HAS OCD AND ON DISABILITY-PER [name]     Past Surgical History  Procedure Date  . Cesarean section     Family History  Problem Relation Age of Onset  . Hypertension Mother   . Coronary artery disease Father   . Cancer Brother     Renal cell carcinoma  . Hypertension Other   . Cancer Other   . Heart attack Father   . Coronary artery disease Brother     History  Substance Use Topics  . Smoking status: Never Smoker   . Smokeless tobacco: Never Used  . Alcohol Use: No    OB History    Grav Para Term Preterm Abortions TAB SAB Ect Mult Living   1 1              Review of Systems  Constitutional: Negative for fever.  HENT: Negative for sore throat and rhinorrhea.   Eyes: Negative for redness.  Respiratory: Negative for cough.   Cardiovascular: Negative for  chest pain.  Gastrointestinal: Negative for nausea, vomiting, abdominal pain and diarrhea.  Genitourinary: Negative for dysuria.  Musculoskeletal: Negative for myalgias.  Skin: Negative for rash.  Neurological: Negative for headaches.    Allergies  Sulfamethoxazole w-trimethoprim  Home Medications   Current Outpatient Rx  Name  Route  Sig  Dispense  Refill  . VITAMIN B COMPLEX PO TABS   Oral   Take 1 tablet by mouth daily.         . CO Q 10 60 MG PO CAPS   Oral   Take 1 tablet by mouth daily.         Marland Kitchen HYDROCHLOROTHIAZIDE 12.5 MG PO TABS   Oral   Take 12.5 mg by mouth daily.          Marland Kitchen LECITHIN 1200 MG PO CAPS   Oral   Take 1 capsule by mouth daily.         Marland Kitchen LEVOTHYROXINE SODIUM 125 MCG PO TABS   Oral   Take 1 tablet (125 mcg total) by mouth daily before breakfast.   21 tablet   0   . LORAZEPAM 1 MG PO TABS   Oral   Take 1 tablet (1 mg total) by mouth  2 (two) times daily.   30 tablet   0   . OMEPRAZOLE 20 MG PO CPDR   Oral   Take 20 mg by mouth daily.         . SELENIUM 200 MCG PO TABS   Oral   Take 1 tablet by mouth daily.         . SERTRALINE HCL 100 MG PO TABS   Oral   Take 200 mg by mouth daily.          . THIOTHIXENE 2 MG PO CAPS   Oral   Take 1 capsule (2 mg total) by mouth at bedtime.   21 capsule   0   . VITAMIN C 500 MG PO TABS   Oral   Take 500 mg by mouth 2 (two) times daily.         Marland Kitchen VITAMIN D (ERGOCALCIFEROL) 50000 UNITS PO CAPS   Oral   Take 50,000 Units by mouth every 7 (seven) days. Takes on Mondays.         Marland Kitchen VITAMIN E 400 UNITS PO CAPS   Oral   Take 800 Units by mouth daily.         Marland Kitchen ZINC 22.5 MG PO TABS   Oral   Take 1 tablet by mouth daily.           BP 143/98  Pulse 100  Temp 99 F (37.2 C) (Oral)  Resp 20  SpO2 97%  Physical Exam  Nursing note and vitals reviewed. Constitutional: She appears well-developed and well-nourished.  HENT:  Head: Normocephalic and atraumatic.  Eyes:  Conjunctivae normal are normal.  Neck: Normal range of motion. Neck supple.  Pulmonary/Chest: No respiratory distress.  Neurological: She is alert.  Skin: Skin is warm and dry.  Psychiatric: She has a normal mood and affect.    ED Course  Procedures (including critical care time)  Labs Reviewed - No data to display No results found.   1. Medication refill     6:04 PM Patient seen and examined. Database shows 2 previous rx in past 6 months for benzodiazepines from ED. Previously from Dr. Ginette Otto.   Vital signs reviewed and are as follows: Filed Vitals:   03/07/12 1651  BP: 143/98  Pulse: 100  Temp: 99 F (37.2 C)  Resp: 20   I told the patient I was not comfortable prescribing controlled medications. She became very upset when I told her this. I spoke with Dr. Adriana Simas and reviewed case. Will prescribe 8 days of these medications, after which patient will need to follow-up with her counselor.    MDM  Patient requesting benzodiazepine and antipsychotic medication refills. I have reservations about doing this as an ED provider -- however database shows patient has had these routinely filled. I do not think she is drug seeking, however the ED is not the place to manage these medications. Given this, I have provided medications until she can see counselor.        Renne Crigler, [place] 03/07/12 [phone]

## 2012-07-03 ENCOUNTER — Encounter (HOSPITAL_COMMUNITY): Payer: Self-pay | Admitting: Emergency Medicine

## 2012-07-03 ENCOUNTER — Emergency Department (HOSPITAL_COMMUNITY)
Admission: EM | Admit: 2012-07-03 | Discharge: 2012-07-03 | Disposition: A | Discharge status: Home / Self Care | Payer: Medicaid Other | Attending: Emergency Medicine | Admitting: Emergency Medicine

## 2012-07-03 DIAGNOSIS — F411 Generalized anxiety disorder: Secondary | ICD-10-CM | POA: Insufficient documentation

## 2012-07-03 DIAGNOSIS — Z8669 Personal history of other diseases of the nervous system and sense organs: Secondary | ICD-10-CM | POA: Insufficient documentation

## 2012-07-03 DIAGNOSIS — Z8542 Personal history of malignant neoplasm of other parts of uterus: Secondary | ICD-10-CM | POA: Insufficient documentation

## 2012-07-03 DIAGNOSIS — M129 Arthropathy, unspecified: Secondary | ICD-10-CM | POA: Insufficient documentation

## 2012-07-03 DIAGNOSIS — F329 Major depressive disorder, single episode, unspecified: Secondary | ICD-10-CM | POA: Insufficient documentation

## 2012-07-03 DIAGNOSIS — Z79899 Other long term (current) drug therapy: Secondary | ICD-10-CM | POA: Insufficient documentation

## 2012-07-03 DIAGNOSIS — Z8739 Personal history of other diseases of the musculoskeletal system and connective tissue: Secondary | ICD-10-CM | POA: Insufficient documentation

## 2012-07-03 DIAGNOSIS — J45901 Unspecified asthma with (acute) exacerbation: Secondary | ICD-10-CM | POA: Insufficient documentation

## 2012-07-03 DIAGNOSIS — R531 Weakness: Secondary | ICD-10-CM

## 2012-07-03 DIAGNOSIS — F429 Obsessive-compulsive disorder, unspecified: Secondary | ICD-10-CM | POA: Insufficient documentation

## 2012-07-03 DIAGNOSIS — K625 Hemorrhage of anus and rectum: Secondary | ICD-10-CM | POA: Insufficient documentation

## 2012-07-03 DIAGNOSIS — R5381 Other malaise: Secondary | ICD-10-CM | POA: Insufficient documentation

## 2012-07-03 DIAGNOSIS — E079 Disorder of thyroid, unspecified: Secondary | ICD-10-CM | POA: Insufficient documentation

## 2012-07-03 DIAGNOSIS — Z8679 Personal history of other diseases of the circulatory system: Secondary | ICD-10-CM | POA: Insufficient documentation

## 2012-07-03 DIAGNOSIS — R1084 Generalized abdominal pain: Secondary | ICD-10-CM | POA: Insufficient documentation

## 2012-07-03 DIAGNOSIS — Z9889 Other specified postprocedural states: Secondary | ICD-10-CM | POA: Insufficient documentation

## 2012-07-03 DIAGNOSIS — F3289 Other specified depressive episodes: Secondary | ICD-10-CM | POA: Insufficient documentation

## 2012-07-03 LAB — RAPID URINE DRUG SCREEN, HOSP PERFORMED
Amphetamines: NOT DETECTED
Barbiturates: NOT DETECTED
Benzodiazepines: NOT DETECTED
Cocaine: NOT DETECTED

## 2012-07-03 LAB — COMPREHENSIVE METABOLIC PANEL
AST: 27 U/L (ref 0–37)
Albumin: 3.8 g/dL (ref 3.5–5.2)
Alkaline Phosphatase: 63 U/L (ref 39–117)
Chloride: 100 mEq/L (ref 96–112)
Potassium: 3.9 mEq/L (ref 3.5–5.1)
Total Bilirubin: 0.2 mg/dL — ABNORMAL LOW (ref 0.3–1.2)
Total Protein: 7.5 g/dL (ref 6.0–8.3)

## 2012-07-03 LAB — CBC
MCHC: 34 g/dL (ref 30.0–36.0)
Platelets: 225 10*3/uL (ref 150–400)
RDW: 13 % (ref 11.5–15.5)
WBC: 8.1 10*3/uL (ref 4.0–10.5)

## 2012-07-03 LAB — SALICYLATE LEVEL: Salicylate Lvl: <2 mg/dL — ABNORMAL LOW (ref 2.8–20.0)

## 2012-07-03 LAB — ACETAMINOPHEN LEVEL: Acetaminophen (Tylenol), Serum: <15 ug/mL (ref 10–30)

## 2012-07-03 MED ORDER — HYDROCODONE-ACETAMINOPHEN 5-325 MG PO TABS
1.0000 | ORAL_TABLET | Freq: Once | ORAL | Status: DC
  Filled 2012-07-03: qty 1

## 2012-07-03 NOTE — ED Notes (Signed)
Pt has multiple complaints at this time. Pt complains of rectal bleeding x 3 months, abdominal pain, wheezing, and weakness at this time. Pt complains of head pains and shaking in extremities at this time as well. Pt states "I just feel like I'm gonna die"

## 2012-07-03 NOTE — ED Provider Notes (Signed)
History     CSN: [id number]  Arrival date & time 07/03/12  1815   First MD Initiated Contact with Patient 07/03/12 1912      Chief Complaint  Patient presents with  . Medical Clearance    (Consider location/radiation/quality/duration/timing/severity/associated sxs/prior treatment) The history is provided by the patient.   patient reports rectal bleeding over the past 3 months.  She describes this as some blood in her stool.  She's also had generalized abdominal pain as well as wheezing and generalized weakness.  She has a history of endometrial cancer and states she's concerned about return of cancer.  No fevers or chills.  No nausea or vomiting.  She denies diarrhea.  At one point the patient stated "I feel like and when did die".  She has a history of depression, fibromyalgia, endometrial cancer, asthma, arthritis, thyroid disease.  She also has a history of anxiety and obsessive-compulsive disorder.  Past Medical History  Diagnosis Date  . Thyroid disease   . Arthritis   . Asthma   . Depression   . Fibromyalgia   . Lumbar spondylolysis   . Allergy   . Endometrial cancer   . Spinal stenosis   . Hypertension   . Dysrhythmia     OCCAS PAC'S AND PVC'S  PER HOLTER MONITOR STUDY --PT HAS OCCAS CHEST PAINS AND PALPITATIONS--HAD ECHO NORMAL LV SIZE AND FUNCTION.  Marland Kitchen Sleep apnea     HAS CPAP MACHINE BUT DOES NOT USE  . Anxiety     HAS OCD AND ON DISABILITY-PER [name]     Past Surgical History  Procedure Laterality Date  . Cesarean section      Family History  Problem Relation Age of Onset  . Hypertension Mother   . Coronary artery disease Father   . Cancer Brother     Renal cell carcinoma  . Hypertension Other   . Cancer Other   . Heart attack Father   . Coronary artery disease Brother     History  Substance Use Topics  . Smoking status: Never Smoker   . Smokeless tobacco: Never Used  . Alcohol Use: No    OB History   Grav Para Term Preterm Abortions TAB  SAB Ect Mult Living   1 1              Review of Systems  All other systems reviewed and are negative.    Allergies  Sulfa antibiotics and Sulfamethoxazole w-trimethoprim  Home Medications   Current Outpatient Rx  Name  Route  Sig  Dispense  Refill  . albuterol (VENTOLIN HFA) 108 (90 BASE) MCG/ACT inhaler   Inhalation   Inhale 2 puffs into the lungs every 4 (four) hours as needed. For shortness of breath         . levothyroxine (SYNTHROID, LEVOTHROID) 125 MCG tablet   Oral   Take 1 tablet (125 mcg total) by mouth daily before breakfast.   21 tablet   0   . LORazepam (ATIVAN) 1 MG tablet   Oral   Take 1 tablet (1 mg total) by mouth 2 (two) times daily.   30 tablet   0   . meloxicam (MOBIC) 7.5 MG tablet   Oral   Take 7.5 mg by mouth at bedtime.         . Methylsulfonylmethane (MSM) 1000 MG TABS   Oral   Take 1 tablet by mouth daily.         Marland Kitchen omeprazole (PRILOSEC) 20 MG  capsule   Oral   Take 20 mg by mouth daily.         . sertraline (ZOLOFT) 100 MG tablet   Oral   Take 200 mg by mouth daily.          Marland Kitchen thiothixene (NAVANE) 2 MG capsule   Oral   Take 1 capsule (2 mg total) by mouth at bedtime.   10 capsule   0   . vitamin C (ASCORBIC ACID) 500 MG tablet   Oral   Take 500 mg by mouth 2 (two) times daily.         . B Complex Vitamins (VITAMIN B COMPLEX) TABS   Oral   Take 1 tablet by mouth daily.         . Coenzyme Q10 (CO Q 10) 60 MG CAPS   Oral   Take 1 tablet by mouth daily.         . Lecithin 1200 MG CAPS   Oral   Take 1 capsule by mouth daily.         Marland Kitchen losartan (COZAAR) 50 MG tablet   Oral   Take 50 mg by mouth daily. Take 1 tablet (50 mg total) by mouth daily.         . Selenium 200 MCG TABS   Oral   Take 1 tablet by mouth daily.         . Vitamin D, Ergocalciferol, (DRISDOL) 50000 UNITS CAPS   Oral   Take 50,000 Units by mouth every 7 (seven) days. Takes on Mondays.         . vitamin E (VITAMIN E) 400 UNIT  capsule   Oral   Take 800 Units by mouth daily.         . Zinc 22.5 MG TABS   Oral   Take 1 tablet by mouth daily.           BP 127/82  Pulse 103  Temp(Src) 98.1 F (36.7 C) (Oral)  Resp 20  SpO2 97%  Physical Exam  Nursing note and vitals reviewed. Constitutional: She is oriented to person, place, and time. She appears well-developed and well-nourished. No distress.  HENT:  Head: Normocephalic and atraumatic.  Eyes: EOM are normal.  Neck: Normal range of motion.  Cardiovascular: Normal rate, regular rhythm and normal heart sounds.   Pulmonary/Chest: Effort normal and breath sounds normal.  Abdominal: Soft. She exhibits no distension. There is no tenderness.  Genitourinary:  The patient refused rectal exam  Musculoskeletal: Normal range of motion.  Neurological: She is alert and oriented to person, place, and time.  Skin: Skin is warm and dry.  Psychiatric: Her mood appears anxious. Her speech is rapid and/or pressured. She is agitated. Cognition and memory are normal. She expresses no homicidal and no suicidal ideation.    ED Course  Procedures (including critical care time)  Labs Reviewed  COMPREHENSIVE METABOLIC PANEL - Abnormal; Notable for the following:    Total Bilirubin 0.2 (*)    All other components within normal limits  SALICYLATE LEVEL - Abnormal; Notable for the following:    Salicylate Lvl <2.0 (*)    All other components within normal limits  ACETAMINOPHEN LEVEL  CBC  ETHANOL  URINE RAPID DRUG SCREEN (HOSP PERFORMED)   No results found.   1. Weakness       MDM  The patient presents with a multitude of complaints.  This would seem to be rectal bleeding for the past 3 months.  Her vital signs and hemoglobin are stable.  She has not had a colonoscopy to this point.  She refuses rectal exam.  I don't think additional workup needs to be done tonight regarding her rectal bleeding this needs to be followed by a gastroenterologist and resources  were given.  She'll likely need colonoscopy.  Should a multitude of other complaints including generalized weakness.  She was able to stand quickly out of the bed without any difficulty.  The patient became frustrated because she felt as though nothing was being done but the majority of her symptoms have been there for months it appears there is no acute emergent process occurring tonight and it all this that the majority of all her symptoms could be handled appropriately as an outpatient with close followup with GI and her primary care physician.        Lyanne Co, MD 07/03/12 2146

## 2012-07-03 NOTE — ED Notes (Addendum)
Pt is tearful and states she does not like the MD that came into the room and is requesting to see another MD. Pt states she will not leave the hospital and nobody can make her until something is done. Notified Tammy,RN Press photographer for [employer].

## 2012-07-03 NOTE — ED Notes (Signed)
Pt states she can't take codeine because it makes her itch and Vicodin "tears up her nerves". Pt states she would rather not take anything because she thought she was going to get something through an IV for pain.

## 2012-07-03 NOTE — ED Notes (Signed)
Pt states she has been having bloody stools for the past 3 months and her last BM was 07/02/12. Pt also states that her thyroid medication was changed recently from to and voiced concern about that being a reason she was weak.

## 2012-07-03 NOTE — ED Notes (Addendum)
Pt also with c/o headache generalized weakness. Pt states I lay around all the time my mom is 65 and she's taking care of me. 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 Pt denies SI/HI at this time

## 2012-08-16 ENCOUNTER — Emergency Department (HOSPITAL_COMMUNITY): Payer: Medicaid Other

## 2012-08-16 ENCOUNTER — Encounter (HOSPITAL_COMMUNITY): Payer: Self-pay | Admitting: Emergency Medicine

## 2012-08-16 ENCOUNTER — Emergency Department (HOSPITAL_COMMUNITY)
Admission: EM | Admit: 2012-08-16 | Discharge: 2012-08-17 | Disposition: A | Discharge status: Home / Self Care | Payer: Medicaid Other | Attending: Emergency Medicine | Admitting: Emergency Medicine

## 2012-08-16 DIAGNOSIS — Z8739 Personal history of other diseases of the musculoskeletal system and connective tissue: Secondary | ICD-10-CM | POA: Insufficient documentation

## 2012-08-16 DIAGNOSIS — F329 Major depressive disorder, single episode, unspecified: Secondary | ICD-10-CM | POA: Insufficient documentation

## 2012-08-16 DIAGNOSIS — E079 Disorder of thyroid, unspecified: Secondary | ICD-10-CM | POA: Insufficient documentation

## 2012-08-16 DIAGNOSIS — I10 Essential (primary) hypertension: Secondary | ICD-10-CM | POA: Insufficient documentation

## 2012-08-16 DIAGNOSIS — R51 Headache: Secondary | ICD-10-CM | POA: Insufficient documentation

## 2012-08-16 DIAGNOSIS — J45909 Unspecified asthma, uncomplicated: Secondary | ICD-10-CM | POA: Insufficient documentation

## 2012-08-16 DIAGNOSIS — Z8544 Personal history of malignant neoplasm of other female genital organs: Secondary | ICD-10-CM | POA: Insufficient documentation

## 2012-08-16 DIAGNOSIS — Z8679 Personal history of other diseases of the circulatory system: Secondary | ICD-10-CM | POA: Insufficient documentation

## 2012-08-16 DIAGNOSIS — R531 Weakness: Secondary | ICD-10-CM

## 2012-08-16 DIAGNOSIS — K921 Melena: Secondary | ICD-10-CM | POA: Insufficient documentation

## 2012-08-16 DIAGNOSIS — Z79899 Other long term (current) drug therapy: Secondary | ICD-10-CM | POA: Insufficient documentation

## 2012-08-16 DIAGNOSIS — F3289 Other specified depressive episodes: Secondary | ICD-10-CM | POA: Insufficient documentation

## 2012-08-16 DIAGNOSIS — K922 Gastrointestinal hemorrhage, unspecified: Secondary | ICD-10-CM | POA: Insufficient documentation

## 2012-08-16 DIAGNOSIS — R5381 Other malaise: Secondary | ICD-10-CM | POA: Insufficient documentation

## 2012-08-16 DIAGNOSIS — F411 Generalized anxiety disorder: Secondary | ICD-10-CM | POA: Insufficient documentation

## 2012-08-16 NOTE — ED Notes (Signed)
Labs to be drawn by RN per [employer].

## 2012-08-16 NOTE — ED Provider Notes (Addendum)
History     CSN: [id number]  Arrival date & time 08/16/12  2201   First MD Initiated Contact with Patient 08/16/12 2300      Chief Complaint  Patient presents with  . Rectal Bleeding    (Consider location/radiation/quality/duration/timing/severity/associated sxs/prior treatment) HPI Comments: Patient presents to the ER for evaluation of generalized weakness. Patient reports that symptoms have been present for several months. She did have her Synthroid dose increased, but hasn't seen any improvement. Patient has been noticing blood in her stools her last 3 months. She reports red blood mixed with the stools intermittently. She has not had abdominal pain or rectal pain. She does report that she had a colonoscopy performed last year that show any abnormalities.  She denies chest pain, or palpitations, syncope and shortness of breath. She has not had any fever associated with the symptoms.  Patient is a 57 y.o. female presenting with hematochezia.  Rectal Bleeding Associated symptoms: no abdominal pain and no fever     Past Medical History  Diagnosis Date  . Thyroid disease   . Arthritis   . Asthma   . Depression   . Fibromyalgia   . Lumbar spondylolysis   . Allergy   . Endometrial cancer   . Spinal stenosis   . Hypertension   . Dysrhythmia     OCCAS PAC'S AND PVC'S  PER HOLTER MONITOR STUDY --PT HAS OCCAS CHEST PAINS AND PALPITATIONS--HAD ECHO NORMAL LV SIZE AND FUNCTION.  Marland Kitchen Sleep apnea     HAS CPAP MACHINE BUT DOES NOT USE  . Anxiety     HAS OCD AND ON DISABILITY-PER [name]     Past Surgical History  Procedure Laterality Date  . Cesarean section      Family History  Problem Relation Age of Onset  . Hypertension Mother   . Coronary artery disease Father   . Cancer Brother     Renal cell carcinoma  . Hypertension Other   . Cancer Other   . Heart attack Father   . Coronary artery disease Brother     History  Substance Use Topics  . Smoking status: Never  Smoker   . Smokeless tobacco: Never Used  . Alcohol Use: No    OB History   Grav Para Term Preterm Abortions TAB SAB Ect Mult Living   1 1              Review of Systems  Constitutional: Negative for fever.  Respiratory: Negative for shortness of breath.   Cardiovascular: Negative for chest pain.  Gastrointestinal: Positive for blood in stool and hematochezia. Negative for abdominal pain.  Neurological: Positive for headaches.  All other systems reviewed and are negative.    Allergies  Sulfa antibiotics  Home Medications   Current Outpatient Rx  Name  Route  Sig  Dispense  Refill  . estradiol (ESTRACE) 0.5 MG tablet   Oral   Take 0.5 mg by mouth every evening.         Marland Kitchen levothyroxine (SYNTHROID, LEVOTHROID) 150 MCG tablet   Oral   Take 150 mcg by mouth daily before breakfast.         . LORazepam (ATIVAN) 1 MG tablet   Oral   Take 1 tablet (1 mg total) by mouth 2 (two) times daily.   30 tablet   0   . meloxicam (MOBIC) 7.5 MG tablet   Oral   Take 7.5 mg by mouth every evening.          Marland Kitchen  omeprazole (PRILOSEC) 20 MG capsule   Oral   Take 20 mg by mouth every evening.          . sertraline (ZOLOFT) 100 MG tablet   Oral   Take 200 mg by mouth every evening.          . thiothixene (NAVANE) 2 MG capsule   Oral   Take 1 capsule (2 mg total) by mouth at bedtime.   10 capsule   0   . albuterol (VENTOLIN HFA) 108 (90 BASE) MCG/ACT inhaler   Inhalation   Inhale 2 puffs into the lungs every 4 (four) hours as needed for shortness of breath. For shortness of breath           BP 122/72  Pulse 91  Temp(Src) 99.2 F (37.3 C) (Oral)  Resp 20  SpO2 97%  Physical Exam  Constitutional: She is oriented to person, place, and time. She appears well-developed and well-nourished. No distress.  HENT:  Head: Normocephalic and atraumatic.  Right Ear: Hearing normal.  Left Ear: Hearing normal.  Nose: Nose normal.  Mouth/Throat: Oropharynx is clear and  moist and mucous membranes are normal.  Eyes: Conjunctivae and EOM are normal. Pupils are equal, round, and reactive to light.  Neck: Normal range of motion. Neck supple.  Cardiovascular: Regular rhythm, S1 normal and S2 normal.  Exam reveals no gallop and no friction rub.   No murmur heard. Pulmonary/Chest: Effort normal and breath sounds normal. No respiratory distress. She exhibits no tenderness.  Abdominal: Soft. Normal appearance and bowel sounds are normal. There is no hepatosplenomegaly. There is no tenderness. There is no rebound, no guarding, no tenderness at McBurney's point and negative Murphy's sign. No hernia.  Musculoskeletal: Normal range of motion.  Neurological: She is alert and oriented to person, place, and time. She has normal strength. No cranial nerve deficit or sensory deficit. Coordination normal. GCS eye subscore is 4. GCS verbal subscore is 5. GCS motor subscore is 6.  Skin: Skin is warm, dry and intact. No rash noted. No cyanosis.  Psychiatric: She has a normal mood and affect. Her speech is normal and behavior is normal. Thought content normal.    ED Course  Procedures (including critical care time)  Labs Reviewed  CBC  BASIC METABOLIC PANEL  PROTIME-INR  TSH   No results found.   Diagnosis: 1. Generalized weakness 2. Rectal bleeding    MDM  Patient comes to the ER with multiple complaints. Patient states that she has been weak for 3 months and has been experiencing rectal bleeding. Reviewing her records reveals that she was seen 2 weeks ago for this. She did not tell me about this visit. At that time, she was discharged to followup with her GI doctor and primary care Dr. She has done neither.  Workup today reveals a benign abdominal exam with normal hemoglobin. She tells me that her Synthroid levels were recently increased. A TSH was ordered, but has not come back immediately here in the ER. The rest of the workup was unremarkable. She also tells me that  she recently had blood work was positive for TB. A chest x-ray was negative for any acute findings.  Patient was told about her results once again was counseled that she needs followup with her doctor and her GI Dr. for the symptoms.  Upon time the patient that she can be discharged, she started to develop new symptoms. She says she is having a headache and she has meningitis again.  Patient is afebrile with a normal white count. She does not have any neck stiffness or meningismus on examination. She was reassured, but she needs to followup with her Dr. Unfortunately, patient became angry and belligerent at this point. She began to yell at me, telling me that her sister was going to report everybody at this hospital for not treating her. Patient was angry, pacing in the room, yelling loudly that she is so weak that her mother has to fix her breakfast for every morning.      Gilda Crease, MD 08/17/12 [id number]  Gilda Crease, MD 08/17/12 0130  Gilda Crease, MD 08/17/12 [phone]

## 2012-08-16 NOTE — ED Notes (Signed)
Per EMS pt has had bloody stools x3 months, and has been feeling weak over the past week. 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 Pt c/o of a cough, green sputum noted no blood.

## 2012-08-17 LAB — CBC
HCT: 41.3 % (ref 36.0–46.0)
MCHC: 34.1 g/dL (ref 30.0–36.0)
MCV: 84.1 fL (ref 78.0–100.0)
Platelets: 206 10*3/uL (ref 150–400)
RDW: 12.8 % (ref 11.5–15.5)
WBC: 8.9 10*3/uL (ref 4.0–10.5)

## 2012-08-17 LAB — PROTIME-INR: Prothrombin Time: 13.3 seconds (ref 11.6–15.2)

## 2012-08-17 LAB — BASIC METABOLIC PANEL
BUN: 9 mg/dL (ref 6–23)
Chloride: 100 mEq/L (ref 96–112)
Creatinine, Ser: 0.63 mg/dL (ref 0.50–1.10)
GFR calc Af Amer: >90 mL/min (ref >90)
GFR calc non Af Amer: >90 mL/min (ref >90)

## 2012-08-23 ENCOUNTER — Emergency Department (HOSPITAL_COMMUNITY): Payer: Medicaid Other

## 2012-08-23 ENCOUNTER — Emergency Department (HOSPITAL_COMMUNITY)
Admission: EM | Admit: 2012-08-23 | Discharge: 2012-08-24 | Disposition: A | Discharge status: Home / Self Care | Payer: Medicaid Other | Attending: Emergency Medicine | Admitting: Emergency Medicine

## 2012-08-23 ENCOUNTER — Encounter (HOSPITAL_COMMUNITY): Payer: Self-pay | Admitting: Emergency Medicine

## 2012-08-23 DIAGNOSIS — Z8679 Personal history of other diseases of the circulatory system: Secondary | ICD-10-CM | POA: Insufficient documentation

## 2012-08-23 DIAGNOSIS — I10 Essential (primary) hypertension: Secondary | ICD-10-CM | POA: Insufficient documentation

## 2012-08-23 DIAGNOSIS — F3289 Other specified depressive episodes: Secondary | ICD-10-CM | POA: Insufficient documentation

## 2012-08-23 DIAGNOSIS — R52 Pain, unspecified: Secondary | ICD-10-CM

## 2012-08-23 DIAGNOSIS — F329 Major depressive disorder, single episode, unspecified: Secondary | ICD-10-CM | POA: Insufficient documentation

## 2012-08-23 DIAGNOSIS — E039 Hypothyroidism, unspecified: Secondary | ICD-10-CM | POA: Insufficient documentation

## 2012-08-23 DIAGNOSIS — R51 Headache: Secondary | ICD-10-CM | POA: Insufficient documentation

## 2012-08-23 DIAGNOSIS — M542 Cervicalgia: Secondary | ICD-10-CM

## 2012-08-23 DIAGNOSIS — Z79899 Other long term (current) drug therapy: Secondary | ICD-10-CM | POA: Insufficient documentation

## 2012-08-23 DIAGNOSIS — Z8739 Personal history of other diseases of the musculoskeletal system and connective tissue: Secondary | ICD-10-CM | POA: Insufficient documentation

## 2012-08-23 DIAGNOSIS — F411 Generalized anxiety disorder: Secondary | ICD-10-CM | POA: Insufficient documentation

## 2012-08-23 DIAGNOSIS — Z791 Long term (current) use of non-steroidal anti-inflammatories (NSAID): Secondary | ICD-10-CM | POA: Insufficient documentation

## 2012-08-23 DIAGNOSIS — J45909 Unspecified asthma, uncomplicated: Secondary | ICD-10-CM | POA: Insufficient documentation

## 2012-08-23 LAB — CBC
HCT: 42.5 % (ref 36.0–46.0)
Hemoglobin: 15.2 g/dL — ABNORMAL HIGH (ref 12.0–15.0)
MCV: 83.8 fL (ref 78.0–100.0)
RBC: 5.07 MIL/uL (ref 3.87–5.11)
RDW: 12.9 % (ref 11.5–15.5)
WBC: 8.1 10*3/uL (ref 4.0–10.5)

## 2012-08-23 LAB — SEDIMENTATION RATE: Sed Rate: 5 mm/hr (ref 0–22)

## 2012-08-23 LAB — POCT I-STAT, CHEM 8
BUN: 10 mg/dL (ref 6–23)
Chloride: 107 mEq/L (ref 96–112)
Creatinine, Ser: 0.7 mg/dL (ref 0.50–1.10)
Potassium: 4 mEq/L (ref 3.5–5.1)
Sodium: 140 mEq/L (ref 135–145)

## 2012-08-23 LAB — BASIC METABOLIC PANEL
CO2: 25 mEq/L (ref 19–32)
Chloride: 100 mEq/L (ref 96–112)
GFR calc Af Amer: >90 mL/min (ref >90)
Potassium: 4 mEq/L (ref 3.5–5.1)
Sodium: 138 mEq/L (ref 135–145)

## 2012-08-23 MED ORDER — ONDANSETRON 4 MG PO TBDP
4.0000 mg | ORAL_TABLET | Freq: Once | ORAL | Status: AC
  Administered 2012-08-23: 4 mg via ORAL
  Filled 2012-08-23: qty 1

## 2012-08-23 MED ORDER — HYDROMORPHONE HCL PF 1 MG/ML IJ SOLN
1.0000 mg | Freq: Once | INTRAMUSCULAR | Status: AC
  Administered 2012-08-23: 1 mg via INTRAVENOUS
  Filled 2012-08-23: qty 1

## 2012-08-23 NOTE — ED Notes (Signed)
Pt returns from ct scan. 

## 2012-08-23 NOTE — ED Notes (Addendum)
Patient with head and neck pain.  Patient denies any injury.  Patient states she feels like she has meningitis again like she did last summer.  Patient is stating she has pain in face, all over head and neck.  Patient states that it is a constant pain.  Patient also states she has blood in her stool and has had it for over a month.

## 2012-08-23 NOTE — ED Provider Notes (Signed)
History     CSN: [id number]  Arrival date & time 08/23/12  1903   First MD Initiated Contact with Patient 08/23/12 2031      Chief Complaint  Patient presents with  . Headache  . Neck Pain    (Consider location/radiation/quality/duration/timing/severity/associated sxs/prior treatment) HPI [name] is a 57 y.o. female with a history of fibromyalgia, endometrial cancer , lymphocytic meningitis that presents emergency department complaining of a headache, neck pain and generalized pain "all over".  Patient states that symptom onset began 2 days ago and have gradually been worsening.  Headache severity is rated at 10/10.  Patient states this is what it felt like when I had my meningitis last year.  Patient denies any fevers, night sweats, chills, nuchal rigidity, rash, head trauma, vomiting, change in vision.  Patient unable to describe the pain she is feeling and is tearful responding "I can explain what it's like to feel this bad all over and you'll never understand because worse cannot explain it" no other complaints at this time.  Past Medical History  Diagnosis Date  . Thyroid disease   . Arthritis   . Asthma   . Depression   . Fibromyalgia   . Lumbar spondylolysis   . Allergy   . Endometrial cancer   . Spinal stenosis   . Hypertension   . Dysrhythmia     OCCAS PAC'S AND PVC'S  PER HOLTER MONITOR STUDY --PT HAS OCCAS CHEST PAINS AND PALPITATIONS--HAD ECHO NORMAL LV SIZE AND FUNCTION.  Marland Kitchen Sleep apnea     HAS CPAP MACHINE BUT DOES NOT USE  . Anxiety     HAS OCD AND ON DISABILITY-PER [name]     Past Surgical History  Procedure Laterality Date  . Cesarean section      Family History  Problem Relation Age of Onset  . Hypertension Mother   . Coronary artery disease Father   . Cancer Brother     Renal cell carcinoma  . Hypertension Other   . Cancer Other   . Heart attack Father   . Coronary artery disease Brother     History  Substance Use Topics  .  Smoking status: Never Smoker   . Smokeless tobacco: Never Used  . Alcohol Use: No    OB History   Grav Para Term Preterm Abortions TAB SAB Ect Mult Living   1 1              Review of Systems Ten systems reviewed and are negative for acute change, except as noted in the HPI.    Allergies  Sulfa antibiotics  Home Medications   Current Outpatient Rx  Name  Route  Sig  Dispense  Refill  . albuterol (VENTOLIN HFA) 108 (90 BASE) MCG/ACT inhaler   Inhalation   Inhale 2 puffs into the lungs every 4 (four) hours as needed for shortness of breath. For shortness of breath         . estradiol (ESTRACE) 0.5 MG tablet   Oral   Take 0.5 mg by mouth every evening.         Marland Kitchen levothyroxine (SYNTHROID, LEVOTHROID) 150 MCG tablet   Oral   Take 150 mcg by mouth daily before breakfast.         . LORazepam (ATIVAN) 1 MG tablet   Oral   Take 1 tablet (1 mg total) by mouth 2 (two) times daily.   30 tablet   0   . meloxicam (MOBIC) 7.5  MG tablet   Oral   Take 7.5 mg by mouth every evening.          Marland Kitchen omeprazole (PRILOSEC) 20 MG capsule   Oral   Take 20 mg by mouth every evening.          . sertraline (ZOLOFT) 100 MG tablet   Oral   Take 200 mg by mouth every evening.          . thiothixene (NAVANE) 2 MG capsule   Oral   Take 1 capsule (2 mg total) by mouth at bedtime.   10 capsule   0     BP 131/71  Pulse 91  Temp(Src) 98.2 F (36.8 C) (Oral)  Resp 20  SpO2 96%  Physical Exam  Nursing note and vitals reviewed. Constitutional: She is oriented to person, place, and time. She appears well-developed and well-nourished. No distress.  HENT:  Head: Normocephalic and atraumatic.  Eyes: Conjunctivae and EOM are normal.  Neck: Normal range of motion.  Neck supple with full range of motion.  Absolutely no nuchal rigidity.  Cardiovascular:  Regular rate rhythm.  Pulmonary/Chest: Effort normal.  Lungs clear to auscultation bilaterally.  And  Abdominal:  Soft  nontender obese abdomen.  Musculoskeletal: Normal range of motion.  Tenderness to palpation at every joint throughout.  Full normal range of motion without crepitus  Neurological: She is alert and oriented to person, place, and time.  Cranial nerves intact.  Good strength and sensation.  Skin: Skin is warm and dry. No rash noted. She is not diaphoretic.  Psychiatric: She has a normal mood and affect. Her behavior is normal.    ED Course  LUMBAR PUNCTURE Date/Time: 08/24/2012 12:00 AM Performed by: Jaci Carrel Authorized by: Jaci Carrel Consent: Verbal consent obtained. written consent obtained. Risks and benefits: risks, benefits and alternatives were discussed Consent given by: patient Patient understanding: patient states understanding of the procedure being performed Patient consent: the patient's understanding of the procedure matches consent given Procedure consent: procedure consent matches procedure scheduled Test results: test results available and properly labeled Site marked: the operative site was marked Patient identity confirmed: verbally with patient and arm band Indications: evaluation for infection Anesthesia: local infiltration Local anesthetic: lidocaine 1% without epinephrine Anesthetic total: 3 ml Patient sedated: no Preparation: Patient was prepped and draped in the usual sterile fashion. Lumbar space: L3-L4 interspace Patient's position: sitting Needle gauge: 20 Needle type: spinal needle - Quincke tip Needle length: 3.5 in Number of attempts: 2 Fluid appearance: clear (1st tube blood tinged) Total volume: 5 ml Post-procedure: site cleaned and adhesive bandage applied Patient tolerance: Patient tolerated the procedure well with no immediate complications.   (including critical care time)  Labs Reviewed  CBC - Abnormal; Notable for the following:    Hemoglobin 15.2 (*)    All other components within normal limits  POCT I-STAT, CHEM 8 - Abnormal;  Notable for the following:    Calcium, Ion 1.25 (*)    Hemoglobin 15.3 (*)    All other components within normal limits  PROTIME-INR  SEDIMENTATION RATE  BASIC METABOLIC PANEL   Ct Head Wo Contrast  08/23/2012   *RADIOLOGY REPORT*  Clinical Data: Headache and neck pain.  CT HEAD WITHOUT CONTRAST  Technique:  Contiguous axial images were obtained from the base of the skull through the vertex without contrast.  Comparison: CT of the head performed 11/25/2011, and MRI of the brain performed 10/10/2011  Findings: There is no evidence of acute infarction,  mass lesion, or intra- or extra-axial hemorrhage on CT.  Mild chronic ischemic change is noted at the left anterior corona radiata.  The posterior fossa, including the cerebellum, brainstem and fourth ventricle, is within normal limits.  The third and lateral ventricles, and basal ganglia are unremarkable in appearance.  The cerebral hemispheres are symmetric in appearance, with normal gray- white differentiation.  No mass effect or midline shift is seen.  There is no evidence of fracture; visualized osseous structures are unremarkable in appearance.  The visualized portions of the orbits are within normal limits.  The paranasal sinuses and mastoid air cells are well-aerated.  No significant soft tissue abnormalities are seen.  IMPRESSION:  1.  No acute intracranial pathology seen on CT. 2.  Mild chronic ischemic change at the left anterior corona radiata.   Original Report Authenticated By: Tonia Ghent, M.D.     No diagnosis found.    MDM  Patient emergency department complaining of headache and neck pain with concern for viral meningitis.  Lumbar puncture performed with CSF fluid more consistent with traumatic tap.  Patient tearful requesting to be admitted for pain located from head to toe.  Explained that patient should follow her chronic pain up with her primary care physician.  Multiple doses of IV pain medication given in the emergency  department without improvement.  Patient stating "I cannot walk or get out of bed how do you think you can send me home".  Medical chart viewed with similar statements and presentation just prior to witnessed normal ambulation.  Similar incident occurred this evening.  Consults to admitting physician who agrees after seeing patient that there is not a medical reason to admit the patient at this time.  Strict return precautions discussed.         Jaci Carrel, [place] 08/24/12 [phone]

## 2012-08-23 NOTE — ED Provider Notes (Signed)
Complains of headache diffuse gradual onset neck pain and back pain onset one week ago. Similar meds as she's had in the past. Temperature 90.9 degrees. On exam alert nontoxic Glasgow Coma Score 15 appears mildly uncomfortable HEENT exam no facial asymmetry neck supple pain on flexion but no meningismus neurologic moves all extremities well cranial nerves II through XII grossly intact Glasgow Coma Score 15  Doug Sou, MD 08/23/12 2229

## 2012-08-23 NOTE — ED Notes (Addendum)
Pt c/o constant severe headache that started 2 days ago, pt states pain radiates from head to neck and jaw bones. Pt states she feels similar to the way she felt when she had meningitis. Pt rates pain 10/10. Pt also complains of abd pain that started yesterday and blood in stools.

## 2012-08-24 LAB — CSF CELL COUNT WITH DIFFERENTIAL
RBC Count, CSF: 228 /mm3 — ABNORMAL HIGH
Tube #: 3
WBC, CSF: 7 /mm3 — ABNORMAL HIGH (ref 0–5)

## 2012-08-24 LAB — PROTEIN, CSF: Total  Protein, CSF: 49 mg/dL — ABNORMAL HIGH (ref 15–45)

## 2012-08-24 LAB — GRAM STAIN

## 2012-08-24 MED ORDER — LORAZEPAM 1 MG PO TABS: 1.0000 mg | ORAL_TABLET | Freq: Three times a day (TID) | ORAL | Status: DC | PRN

## 2012-08-24 MED ORDER — NAPROXEN 375 MG PO TABS: 375.0000 mg | ORAL_TABLET | Freq: Two times a day (BID) | ORAL | Status: DC

## 2012-08-24 NOTE — ED Notes (Signed)
Pt for discharge.Pt refusing to go home.She wants to be admitted and wants pain medications.

## 2012-08-24 NOTE — ED Provider Notes (Signed)
Medical screening examination/treatment/procedure(s) were conducted as a shared visit with non-physician practitioner(s) and myself.  I personally evaluated the patient during the encounter  Doug Sou, MD 08/24/12 1109

## 2012-08-24 NOTE — ED Notes (Signed)
Dr Julian Reil talking to the pt.

## 2012-08-24 NOTE — ED Notes (Signed)
Pt discharged.Vital signs stable and GCS 15 

## 2012-08-27 LAB — CSF CULTURE W GRAM STAIN: Culture: NO GROWTH

## 2012-08-31 ENCOUNTER — Emergency Department (HOSPITAL_COMMUNITY): Payer: Medicaid Other

## 2012-08-31 ENCOUNTER — Encounter (HOSPITAL_COMMUNITY): Payer: Self-pay | Admitting: *Deleted

## 2012-08-31 DIAGNOSIS — I10 Essential (primary) hypertension: Secondary | ICD-10-CM | POA: Insufficient documentation

## 2012-08-31 DIAGNOSIS — J45909 Unspecified asthma, uncomplicated: Secondary | ICD-10-CM | POA: Insufficient documentation

## 2012-08-31 DIAGNOSIS — N390 Urinary tract infection, site not specified: Secondary | ICD-10-CM | POA: Insufficient documentation

## 2012-08-31 DIAGNOSIS — Z8542 Personal history of malignant neoplasm of other parts of uterus: Secondary | ICD-10-CM | POA: Insufficient documentation

## 2012-08-31 DIAGNOSIS — F411 Generalized anxiety disorder: Secondary | ICD-10-CM | POA: Insufficient documentation

## 2012-08-31 DIAGNOSIS — Z8679 Personal history of other diseases of the circulatory system: Secondary | ICD-10-CM | POA: Insufficient documentation

## 2012-08-31 DIAGNOSIS — R1110 Vomiting, unspecified: Secondary | ICD-10-CM | POA: Insufficient documentation

## 2012-08-31 DIAGNOSIS — E669 Obesity, unspecified: Secondary | ICD-10-CM | POA: Insufficient documentation

## 2012-08-31 DIAGNOSIS — R109 Unspecified abdominal pain: Secondary | ICD-10-CM | POA: Insufficient documentation

## 2012-08-31 DIAGNOSIS — Z8669 Personal history of other diseases of the nervous system and sense organs: Secondary | ICD-10-CM | POA: Insufficient documentation

## 2012-08-31 DIAGNOSIS — E079 Disorder of thyroid, unspecified: Secondary | ICD-10-CM | POA: Insufficient documentation

## 2012-08-31 DIAGNOSIS — Z8739 Personal history of other diseases of the musculoskeletal system and connective tissue: Secondary | ICD-10-CM | POA: Insufficient documentation

## 2012-08-31 DIAGNOSIS — Z79899 Other long term (current) drug therapy: Secondary | ICD-10-CM | POA: Insufficient documentation

## 2012-08-31 LAB — CBC WITH DIFFERENTIAL/PLATELET
Basophils Relative: 1 % (ref 0–1)
HCT: 42.6 % (ref 36.0–46.0)
Hemoglobin: 15.5 g/dL — ABNORMAL HIGH (ref 12.0–15.0)
Lymphs Abs: 4.2 10*3/uL — ABNORMAL HIGH (ref 0.7–4.0)
MCH: 30.5 pg (ref 26.0–34.0)
MCHC: 36.4 g/dL — ABNORMAL HIGH (ref 30.0–36.0)
Monocytes Absolute: 0.5 10*3/uL (ref 0.1–1.0)
Monocytes Relative: 5 % (ref 3–12)
Neutro Abs: 4.1 10*3/uL (ref 1.7–7.7)
Neutrophils Relative %: 44 % (ref 43–77)
RBC: 5.08 MIL/uL (ref 3.87–5.11)

## 2012-08-31 LAB — COMPREHENSIVE METABOLIC PANEL
Albumin: 4.5 g/dL (ref 3.5–5.2)
Alkaline Phosphatase: 65 U/L (ref 39–117)
BUN: 11 mg/dL (ref 6–23)
Chloride: 101 mEq/L (ref 96–112)
Creatinine, Ser: 0.7 mg/dL (ref 0.50–1.10)
GFR calc Af Amer: >90 mL/min (ref >90)
GFR calc non Af Amer: >90 mL/min (ref >90)
Glucose, Bld: 84 mg/dL (ref 70–99)
Potassium: 3.8 mEq/L (ref 3.5–5.1)
Total Bilirubin: 0.4 mg/dL (ref 0.3–1.2)

## 2012-08-31 LAB — POCT I-STAT TROPONIN I: Troponin i, poc: 0 ng/mL (ref 0.00–0.08)

## 2012-08-31 NOTE — ED Notes (Signed)
Pt states epigastric pain for the past three days. Pt states that she has been having [facility] abdominal area that radiates to her back. Pt has hx of gallbladder that also needs to come out but she has not had it out. Pt states that she also wants her pancreatis checked. Pt diagnosed with endometrial cancer 2012 had a hysterectomey and is worried that it has spread.

## 2012-08-31 NOTE — ED Notes (Signed)
Wait time advised 

## 2012-08-31 NOTE — ED Notes (Signed)
Patient called for blood draw no answer 

## 2012-09-01 ENCOUNTER — Emergency Department (HOSPITAL_COMMUNITY): Payer: Medicaid Other

## 2012-09-01 ENCOUNTER — Emergency Department (HOSPITAL_COMMUNITY)
Admission: EM | Admit: 2012-09-01 | Discharge: 2012-09-01 | Disposition: A | Discharge status: Home / Self Care | Payer: Medicaid Other | Attending: Emergency Medicine | Admitting: Emergency Medicine

## 2012-09-01 DIAGNOSIS — R109 Unspecified abdominal pain: Secondary | ICD-10-CM

## 2012-09-01 DIAGNOSIS — N390 Urinary tract infection, site not specified: Secondary | ICD-10-CM

## 2012-09-01 LAB — URINALYSIS, ROUTINE W REFLEX MICROSCOPIC
Nitrite: NEGATIVE
Specific Gravity, Urine: 1.013 (ref 1.005–1.030)
Urobilinogen, UA: 0.2 mg/dL (ref 0.0–1.0)

## 2012-09-01 LAB — POCT I-STAT TROPONIN I: Troponin i, poc: 0.01 ng/mL (ref 0.00–0.08)

## 2012-09-01 MED ORDER — CEPHALEXIN 500 MG PO CAPS: 500.0000 mg | ORAL_CAPSULE | Freq: Four times a day (QID) | ORAL | Status: DC

## 2012-09-01 MED ORDER — HYDROMORPHONE HCL PF 1 MG/ML IJ SOLN
1.0000 mg | Freq: Once | INTRAMUSCULAR | Status: AC
  Administered 2012-09-01: 1 mg via INTRAVENOUS
  Filled 2012-09-01: qty 1

## 2012-09-01 MED ORDER — PROMETHAZINE HCL 25 MG PO TABS: 25.0000 mg | ORAL_TABLET | Freq: Four times a day (QID) | ORAL | Status: DC | PRN

## 2012-09-01 MED ORDER — DIPHENHYDRAMINE HCL 50 MG/ML IJ SOLN
50.0000 mg | Freq: Once | INTRAMUSCULAR | Status: AC
  Administered 2012-09-01: 50 mg via INTRAVENOUS
  Filled 2012-09-01: qty 1

## 2012-09-01 MED ORDER — ONDANSETRON HCL 4 MG/2ML IJ SOLN
4.0000 mg | Freq: Once | INTRAMUSCULAR | Status: AC
  Administered 2012-09-01: 4 mg via INTRAVENOUS
  Filled 2012-09-01: qty 2

## 2012-09-01 MED ORDER — GI COCKTAIL ~~LOC~~
30.0000 mL | Freq: Once | ORAL | Status: AC
  Administered 2012-09-01: 30 mL via ORAL
  Filled 2012-09-01: qty 30

## 2012-09-01 MED ORDER — DEXTROSE 5 % IV SOLN
1.0000 g | Freq: Once | INTRAVENOUS | Status: AC
  Administered 2012-09-01: 1 g via INTRAVENOUS
  Filled 2012-09-01: qty 10

## 2012-09-01 MED ORDER — OXYCODONE-ACETAMINOPHEN 5-325 MG PO TABS: 2.0000 | ORAL_TABLET | ORAL | Status: DC | PRN

## 2012-09-01 NOTE — ED Notes (Signed)
Patient transported to Ultrasound 

## 2012-09-01 NOTE — ED Provider Notes (Addendum)
History     CSN: [id number]  Arrival date & time 08/31/12  2043   First MD Initiated Contact with Patient 09/01/12 0116      Chief Complaint  Patient presents with  . Abdominal Pain    (Consider location/radiation/quality/duration/timing/severity/associated sxs/prior treatment) The history is provided by the patient.  [name] is a 57 y.o. female hx of fibromyalgia, endometrial cancer s/p hysterectomy here with abdominal pain. She has chronic abdominal pain that got worse over the last 3 days. It is epigastric that radiate to her back. She also has some vomiting but no diarrhea. She is anxious about her symptoms and worried that her endometrial cancer spreads and that her gallbladder needs to be removed and that she may have pancreatic cancer (she has a friend with pancreatic cancer but no family history of it). She has GI and surgery f/u. She was seen in the ED a week ago for the same symptoms and was sent home after normal labs and UA.    Past Medical History  Diagnosis Date  . Thyroid disease   . Arthritis   . Asthma   . Depression   . Fibromyalgia   . Lumbar spondylolysis   . Allergy   . Endometrial cancer   . Spinal stenosis   . Hypertension   . Dysrhythmia     OCCAS PAC'S AND PVC'S  PER HOLTER MONITOR STUDY --PT HAS OCCAS CHEST PAINS AND PALPITATIONS--HAD ECHO NORMAL LV SIZE AND FUNCTION.  Marland Kitchen Sleep apnea     HAS CPAP MACHINE BUT DOES NOT USE  . Anxiety     HAS OCD AND ON DISABILITY-PER [name]     Past Surgical History  Procedure Laterality Date  . Cesarean section    . Abdominal hysterectomy      Family History  Problem Relation Age of Onset  . Hypertension Mother   . Coronary artery disease Father   . Cancer Brother     Renal cell carcinoma  . Hypertension Other   . Cancer Other   . Heart attack Father   . Coronary artery disease Brother     History  Substance Use Topics  . Smoking status: Never Smoker   . Smokeless tobacco: Never Used   . Alcohol Use: No    OB History   Grav Para Term Preterm Abortions TAB SAB Ect Mult Living   1 1              Review of Systems  Gastrointestinal: Positive for vomiting and abdominal pain.  All other systems reviewed and are negative.    Allergies  Sulfa antibiotics  Home Medications   Current Outpatient Rx  Name  Route  Sig  Dispense  Refill  . Ascorbic Acid (VITAMIN C PO)   Oral   Take 1 tablet by mouth daily. Ester C 500         . estradiol (ESTRACE) 0.5 MG tablet   Oral   Take 0.5 mg by mouth every evening.         Marland Kitchen ibuprofen (ADVIL,MOTRIN) 200 MG tablet   Oral   Take 800 mg by mouth daily as needed for pain.         Marland Kitchen levothyroxine (SYNTHROID, LEVOTHROID) 150 MCG tablet   Oral   Take 150 mcg by mouth daily before breakfast.         . LORazepam (ATIVAN) 1 MG tablet   Oral   Take 1 tablet (1 mg total) by mouth 2 (  two) times daily.   30 tablet   0   . meloxicam (MOBIC) 7.5 MG tablet   Oral   Take 7.5 mg by mouth every evening.          . Methylsulfonylmethane (MSM) 1000 MG CAPS   Oral   Take 1,000 mg by mouth daily.         . Naphazoline HCl (CLEAR EYES OP)   Both Eyes   Place 3 drops into both eyes daily as needed (for redness).         Marland Kitchen omeprazole (PRILOSEC) 20 MG capsule   Oral   Take 20 mg by mouth every evening.          . sertraline (ZOLOFT) 100 MG tablet   Oral   Take 200 mg by mouth every evening.          . thiothixene (NAVANE) 2 MG capsule   Oral   Take 1 capsule (2 mg total) by mouth at bedtime.   10 capsule   0     BP 132/93  Pulse 88  Temp(Src) 98.3 F (36.8 C) (Oral)  Resp 14  Ht 4\' 11"  (1.499 m)  Wt 190 lb (86.183 kg)  BMI 38.35 kg/m2  SpO2 98%  Physical Exam  Nursing note and vitals reviewed. Constitutional: She is oriented to person, place, and time.  Anxious, tearful and crying   HENT:  Head: Normocephalic.  Mouth/Throat: Oropharynx is clear and moist.  Eyes: Conjunctivae are normal.  Pupils are equal, round, and reactive to light.  Neck: Normal range of motion. Neck supple.  Cardiovascular: Normal rate, regular rhythm and normal heart sounds.   Pulmonary/Chest: Effort normal and breath sounds normal. No respiratory distress. She has no wheezes. She has no rales.  Abdominal: Soft.  Obese, mild diffuse tenderness worse in epigastrium. Mild RUQ tenderness no obvious murphy's sign.   Musculoskeletal: Normal range of motion.  Neurological: She is alert and oriented to person, place, and time.  Skin: Skin is warm and dry.  Psychiatric: She has a normal mood and affect. Her behavior is normal. Judgment and thought content normal.    ED Course  Procedures (including critical care time)  Labs Reviewed  CBC WITH DIFFERENTIAL - Abnormal; Notable for the following:    Hemoglobin 15.5 (*)    MCHC 36.4 (*)    Lymphs Abs 4.2 (*)    All other components within normal limits  URINALYSIS, ROUTINE W REFLEX MICROSCOPIC - Abnormal; Notable for the following:    APPearance CLOUDY (*)    Leukocytes, UA MODERATE (*)    All other components within normal limits  URINE MICROSCOPIC-ADD ON - Abnormal; Notable for the following:    Squamous Epithelial / LPF MANY (*)    Bacteria, UA FEW (*)    Casts HYALINE CASTS (*)    All other components within normal limits  URINE CULTURE  COMPREHENSIVE METABOLIC PANEL  LIPASE, BLOOD  POCT I-STAT TROPONIN I  POCT I-STAT TROPONIN I   Dg Chest 2 View  08/31/2012   *RADIOLOGY REPORT*  Clinical Data: Chest pain.  CHEST - 2 VIEW  Comparison: 08/16/2012.  Findings: Normal sized heart.  Clear lungs with normal vascularity. Thoracic spine degenerative changes.  IMPRESSION: No acute abnormality.   Original Report Authenticated By: Beckie Salts, M.D.   US Abdomen Complete  09/01/2012   *RADIOLOGY REPORT*  Clinical Data:  Abdominal pain.  ABDOMINAL ULTRASOUND COMPLETE  Comparison:  CT of the abdomen and pelvis performed 08/23/2011, and  abdominal ultrasound  performed 07/26/2011  Findings:  Gallbladder:  The gallbladder is normal in appearance, without evidence for gallstones, gallbladder wall thickening or pericholecystic fluid.  No ultrasonographic Murphy's sign is elicited.  Common Bile Duct:  Not visualized due to overlying bowel gas.  Liver:  Diffusely increased parenchymal echogenicity and coarsened echotexture, compatible with fatty infiltration; no focal lesions identified.  Limited Doppler evaluation demonstrates normal blood flow within the liver.  IVC:  Not visualized due to overlying bowel gas.  Pancreas:  Not visualized due to overlying bowel gas.  Spleen:  13.5 cm in length; mildly enlarged, though normal in echotexture.  Right kidney:  9.9 cm in length; normal in size, configuration and parenchymal echogenicity.  No evidence of mass or hydronephrosis.  Left kidney:  11.0 cm in length; normal in size, configuration and parenchymal echogenicity.  No evidence of mass or hydronephrosis.  Abdominal Aorta:  Normal in caliber; no aneurysm identified.  IMPRESSION:  1.  No acute abnormality seen within the abdomen. 2.  Diffuse fatty infiltration within the liver. 3.  Mild splenomegaly.   Original Report Authenticated By: Tonia Ghent, M.D.     No diagnosis found.    Date: 09/01/2012  Rate: 98  Rhythm: normal sinus rhythm  QRS Axis: normal  Intervals: normal  ST/T Wave abnormalities: nonspecific ST changes  Conduction Disutrbances:none  Narrative Interpretation:   Old EKG Reviewed: unchanged   MDM  Amairani Shuey Upshaw is a 57 y.o. female here with worsening chronic abdominal pain. I told patient that we should give her meds to get her pain under control. However, she is convinced that she may have cholecystitis or pancreatic cancer and wants imaging. CT ab/pel a year ago unremarkable. I ordered abdominal US that showed nl gallbladder and no gallstones. Lipase nl. Trop neg x 2 and I doubt she has ACS. Patient has a UTI and was given rocephin and  d/c home on keflex. I told her that she has chronic abdominal pain. She will f/u with GI and surgery. Will give short course of narcotics and phenergan.          Richardean Canal, MD 09/01/12 4098  Richardean Canal, MD 09/01/12 [phone]

## 2012-09-02 LAB — URINE CULTURE

## 2012-09-05 ENCOUNTER — Emergency Department (HOSPITAL_COMMUNITY): Payer: Medicaid Other

## 2012-09-05 ENCOUNTER — Emergency Department (HOSPITAL_COMMUNITY)
Admission: EM | Admit: 2012-09-05 | Discharge: 2012-09-06 | Disposition: A | Discharge status: Home / Self Care | Payer: Medicaid Other | Attending: Emergency Medicine | Admitting: Emergency Medicine

## 2012-09-05 ENCOUNTER — Encounter (HOSPITAL_COMMUNITY): Payer: Self-pay | Admitting: Emergency Medicine

## 2012-09-05 DIAGNOSIS — IMO0001 Reserved for inherently not codable concepts without codable children: Secondary | ICD-10-CM | POA: Insufficient documentation

## 2012-09-05 DIAGNOSIS — M4800 Spinal stenosis, site unspecified: Secondary | ICD-10-CM | POA: Insufficient documentation

## 2012-09-05 DIAGNOSIS — M4310 Spondylolisthesis, site unspecified: Secondary | ICD-10-CM | POA: Insufficient documentation

## 2012-09-05 DIAGNOSIS — Z8544 Personal history of malignant neoplasm of other female genital organs: Secondary | ICD-10-CM | POA: Insufficient documentation

## 2012-09-05 DIAGNOSIS — F3289 Other specified depressive episodes: Secondary | ICD-10-CM | POA: Insufficient documentation

## 2012-09-05 DIAGNOSIS — M129 Arthropathy, unspecified: Secondary | ICD-10-CM | POA: Insufficient documentation

## 2012-09-05 DIAGNOSIS — R109 Unspecified abdominal pain: Secondary | ICD-10-CM

## 2012-09-05 DIAGNOSIS — E079 Disorder of thyroid, unspecified: Secondary | ICD-10-CM | POA: Insufficient documentation

## 2012-09-05 DIAGNOSIS — J45909 Unspecified asthma, uncomplicated: Secondary | ICD-10-CM | POA: Insufficient documentation

## 2012-09-05 DIAGNOSIS — F411 Generalized anxiety disorder: Secondary | ICD-10-CM | POA: Insufficient documentation

## 2012-09-05 DIAGNOSIS — F329 Major depressive disorder, single episode, unspecified: Secondary | ICD-10-CM | POA: Insufficient documentation

## 2012-09-05 DIAGNOSIS — Z79899 Other long term (current) drug therapy: Secondary | ICD-10-CM | POA: Insufficient documentation

## 2012-09-05 DIAGNOSIS — I10 Essential (primary) hypertension: Secondary | ICD-10-CM | POA: Insufficient documentation

## 2012-09-05 DIAGNOSIS — R110 Nausea: Secondary | ICD-10-CM | POA: Insufficient documentation

## 2012-09-05 DIAGNOSIS — G4730 Sleep apnea, unspecified: Secondary | ICD-10-CM | POA: Insufficient documentation

## 2012-09-05 DIAGNOSIS — I499 Cardiac arrhythmia, unspecified: Secondary | ICD-10-CM | POA: Insufficient documentation

## 2012-09-05 DIAGNOSIS — Z882 Allergy status to sulfonamides status: Secondary | ICD-10-CM | POA: Insufficient documentation

## 2012-09-05 LAB — URINALYSIS, ROUTINE W REFLEX MICROSCOPIC
Bilirubin Urine: NEGATIVE
Glucose, UA: NEGATIVE mg/dL
Hgb urine dipstick: NEGATIVE
Ketones, ur: 15 mg/dL — AB
Nitrite: NEGATIVE
Protein, ur: NEGATIVE mg/dL
Specific Gravity, Urine: 1.025 (ref 1.005–1.030)
Urobilinogen, UA: 0.2 mg/dL (ref 0.0–1.0)
pH: 6 (ref 5.0–8.0)

## 2012-09-05 LAB — POCT I-STAT, CHEM 8
Calcium, Ion: 1.21 mmol/L (ref 1.12–1.23)
Creatinine, Ser: 0.8 mg/dL (ref 0.50–1.10)
Glucose, Bld: 88 mg/dL (ref 70–99)
HCT: 47 % — ABNORMAL HIGH (ref 36.0–46.0)
Hemoglobin: 16 g/dL — ABNORMAL HIGH (ref 12.0–15.0)
Potassium: 4.2 mEq/L (ref 3.5–5.1)
TCO2: 24 mmol/L (ref 0–100)

## 2012-09-05 LAB — CBC WITH DIFFERENTIAL/PLATELET
Basophils Absolute: 0.1 10*3/uL (ref 0.0–0.1)
Basophils Relative: 1 % (ref 0–1)
Eosinophils Absolute: 0.4 10*3/uL (ref 0.0–0.7)
HCT: 44.4 % (ref 36.0–46.0)
MCH: 30.5 pg (ref 26.0–34.0)
MCHC: 36.7 g/dL — ABNORMAL HIGH (ref 30.0–36.0)
Monocytes Absolute: 0.9 10*3/uL (ref 0.1–1.0)
Neutro Abs: 4 10*3/uL (ref 1.7–7.7)
Neutrophils Relative %: 43 % (ref 43–77)
RDW: 12.9 % (ref 11.5–15.5)

## 2012-09-05 LAB — URINE MICROSCOPIC-ADD ON

## 2012-09-05 MED ORDER — DICYCLOMINE HCL 10 MG PO CAPS
10.0000 mg | ORAL_CAPSULE | Freq: Once | ORAL | Status: AC
  Administered 2012-09-05: 10 mg via ORAL
  Filled 2012-09-05: qty 1

## 2012-09-05 MED ORDER — HYDROMORPHONE HCL PF 1 MG/ML IJ SOLN
1.0000 mg | Freq: Once | INTRAMUSCULAR | Status: AC
  Administered 2012-09-05: 1 mg via INTRAMUSCULAR
  Filled 2012-09-05: qty 1

## 2012-09-05 NOTE — ED Notes (Signed)
Patient transported to X-ray 

## 2012-09-05 NOTE — ED Notes (Signed)
Pt asked Josh N. EMT why is she not getting her Dilaudid IV, pt stated "I didn't get that warm flush feeling like I normally do when I get it IV."

## 2012-09-05 NOTE — ED Notes (Signed)
Patient with continued abdominal pain, getting worse.  Patient seen a few days ago with same.  No nausea or vomiting with the pain.  Patient does have urinary tract infection at this time.

## 2012-09-05 NOTE — ED Notes (Signed)
Pt c/o chronic epigastric pain radiating through to her back, pt seen here for the same 09/01/2012. Pt currently being evaluated by a GI specialist, pt states "they want to take my Gallbladder out but I haven't set the appointment up yet."

## 2012-09-05 NOTE — ED Notes (Signed)
Campos MD at bedside. 

## 2012-09-06 ENCOUNTER — Ambulatory Visit: Payer: Medicaid Other

## 2012-09-06 LAB — HEPATIC FUNCTION PANEL
ALT: 36 U/L — ABNORMAL HIGH (ref 0–35)
AST: 34 U/L (ref 0–37)
Albumin: 4.5 g/dL (ref 3.5–5.2)
Alkaline Phosphatase: 65 U/L (ref 39–117)
Bilirubin, Direct: <0.1 mg/dL (ref 0.0–0.3)
Total Bilirubin: 0.4 mg/dL (ref 0.3–1.2)

## 2012-09-06 MED ORDER — OXYCODONE-ACETAMINOPHEN 5-325 MG PO TABS
ORAL_TABLET | ORAL | Status: AC
  Filled 2012-09-06: qty 1

## 2012-09-06 MED ORDER — HYDROCODONE-ACETAMINOPHEN 5-325 MG PO TABS: 1.0000 | ORAL_TABLET | ORAL | Status: DC | PRN

## 2012-09-06 NOTE — ED Provider Notes (Signed)
History     CSN: [id number]  Arrival date & time 09/05/12  2022   First MD Initiated Contact with Patient 09/05/12 2304      Chief Complaint  Patient presents with  . Abdominal Pain     The history is provided by the patient and medical records.   patient has a history of recurrent abdominal pain.  She reports her abdominal pain is worsened over the past several days.  Her pain is more located in the upper abdomen.  She's had nausea without vomiting.  She denies diarrhea.  No fevers or chills.  No urinary frequency or dysuria.  She denies melena and hematochezia.  She states she has an appointment with the gastroenterologist scheduled for 5 days from now.  She has no pain medication at home.  She's been seen emergency room several times for similar symptoms.  No clear etiology has been determined as the cause of her abdominal pain through her multiple ER visits.  She's been referred to GI on several occasions and finally now has an appointment.  She has continued to discuss her abdominal pain with her primary care physician who also has not found an etiology for her symptoms.  Her pain is moderate in severity.  Nothing worsens or improves her symptoms.  Her pain is not changed by food.  She's had an abdominal hysterectomy.  She still has her gallbladder.  Past Medical History  Diagnosis Date  . Thyroid disease   . Arthritis   . Asthma   . Depression   . Fibromyalgia   . Lumbar spondylolysis   . Allergy   . Endometrial cancer   . Spinal stenosis   . Hypertension   . Dysrhythmia     OCCAS PAC'S AND PVC'S  PER HOLTER MONITOR STUDY --PT HAS OCCAS CHEST PAINS AND PALPITATIONS--HAD ECHO NORMAL LV SIZE AND FUNCTION.  Marland Kitchen Sleep apnea     HAS CPAP MACHINE BUT DOES NOT USE  . Anxiety     HAS OCD AND ON DISABILITY-PER [name]     Past Surgical History  Procedure Laterality Date  . Cesarean section    . Abdominal hysterectomy      Family History  Problem Relation Age of Onset  .  Hypertension Mother   . Coronary artery disease Father   . Cancer Brother     Renal cell carcinoma  . Hypertension Other   . Cancer Other   . Heart attack Father   . Coronary artery disease Brother     History  Substance Use Topics  . Smoking status: Never Smoker   . Smokeless tobacco: Never Used  . Alcohol Use: No    OB History   Grav Para Term Preterm Abortions TAB SAB Ect Mult Living   1 1              Review of Systems  Gastrointestinal: Positive for abdominal pain.  All other systems reviewed and are negative.    Allergies  Sulfa antibiotics  Home Medications   Current Outpatient Rx  Name  Route  Sig  Dispense  Refill  . acetaminophen (TYLENOL) 500 MG tablet   Oral   Take 1,000 mg by mouth every 6 (six) hours as needed for pain.         . Ascorbic Acid (VITAMIN C PO)   Oral   Take 1 tablet by mouth daily. Ester C 500         . cephALEXin (KEFLEX) 500 MG capsule  Oral   Take 1 capsule (500 mg total) by mouth 4 (four) times daily.   28 capsule   0     Dispense as written.   Marland Kitchen estradiol (ESTRACE) 0.5 MG tablet   Oral   Take 0.5 mg by mouth every evening.         Marland Kitchen ibuprofen (ADVIL,MOTRIN) 200 MG tablet   Oral   Take 800 mg by mouth daily as needed for pain.         Marland Kitchen levothyroxine (SYNTHROID, LEVOTHROID) 150 MCG tablet   Oral   Take 150 mcg by mouth daily before breakfast.         . LORazepam (ATIVAN) 1 MG tablet   Oral   Take 1 tablet (1 mg total) by mouth 2 (two) times daily.   30 tablet   0   . Methylsulfonylmethane (MSM) 1000 MG CAPS   Oral   Take 1,000 mg by mouth daily.         . Naphazoline HCl (CLEAR EYES OP)   Both Eyes   Place 3 drops into both eyes daily as needed (for redness).         Marland Kitchen omeprazole (PRILOSEC) 40 MG capsule   Oral   Take 40 mg by mouth daily.         Marland Kitchen oxyCODONE-acetaminophen (PERCOCET) 5-325 MG per tablet   Oral   Take 2 tablets by mouth every 4 (four) hours as needed for pain.   10  tablet   0   . sertraline (ZOLOFT) 100 MG tablet   Oral   Take 200 mg by mouth every evening.          . thiothixene (NAVANE) 2 MG capsule   Oral   Take 1 capsule (2 mg total) by mouth at bedtime.   10 capsule   0   . HYDROcodone-acetaminophen (NORCO/VICODIN) 5-325 MG per tablet   Oral   Take 1 tablet by mouth every 4 (four) hours as needed for pain.   8 tablet   0     BP 156/104  Pulse 105  Temp(Src) 97.9 F (36.6 C) (Oral)  Resp 18  SpO2 97%  Physical Exam  Nursing note and vitals reviewed. Constitutional: She is oriented to person, place, and time. She appears well-developed and well-nourished. No distress.  HENT:  Head: Normocephalic and atraumatic.  Eyes: EOM are normal.  Neck: Normal range of motion.  Cardiovascular: Normal rate, regular rhythm and normal heart sounds.   Pulmonary/Chest: Effort normal and breath sounds normal.  Abdominal: Soft. She exhibits no distension. There is no tenderness. There is no rebound and no guarding.  Musculoskeletal: Normal range of motion.  Neurological: She is alert and oriented to person, place, and time.  Skin: Skin is warm and dry.  Psychiatric: She has a normal mood and affect. Judgment normal.    ED Course  Procedures (including critical care time)  Labs Reviewed  CBC WITH DIFFERENTIAL - Abnormal; Notable for the following:    RBC 5.34 (*)    Hemoglobin 16.3 (*)    MCHC 36.7 (*)    Lymphs Abs 4.1 (*)    All other components within normal limits  URINALYSIS, ROUTINE W REFLEX MICROSCOPIC - Abnormal; Notable for the following:    APPearance CLOUDY (*)    Ketones, ur 15 (*)    Leukocytes, UA MODERATE (*)    All other components within normal limits  URINE MICROSCOPIC-ADD ON - Abnormal; Notable for the following:  Squamous Epithelial / LPF MANY (*)    All other components within normal limits  HEPATIC FUNCTION PANEL - Abnormal; Notable for the following:    ALT 36 (*)    All other components within normal  limits  POCT I-STAT, CHEM 8 - Abnormal; Notable for the following:    Hemoglobin 16.0 (*)    HCT 47.0 (*)    All other components within normal limits  LIPASE, BLOOD   Dg Abd 2 Views  09/06/2012   *RADIOLOGY REPORT*  Clinical Data: Abdominal pain and history of endometrial carcinoma.  ABDOMEN - 2 VIEW  Comparison: 11/24/2011  Findings: There is no evidence of bowel obstruction or ileus.  No free air is identified.  Moderate stool is present throughout most of the colon.  No abnormal calcifications.  Stable degenerative changes are noted of the lumbar spine.  IMPRESSION: No evidence of bowel obstruction.   Original Report Authenticated By: Irish Lack, M.D.   I personally reviewed the imaging tests through PACS system I reviewed available ER/hospitalization records through the EMR   1. Abdominal pain       MDM  This is a be more in a shoe with recurrent and chronic abdominal pain.  Vital signs are normal.  Labs are reassuring.  Abdominal exam is benign.  Plain film demonstrates no evidence of free air evidence of bowel obstruction.  Discharge home with primary care and gastroenterology followup.  She understands return to the ER for new or worsening symptoms.         Lyanne Co, MD 09/06/12 0700

## 2012-09-06 NOTE — ED Notes (Signed)
Pt asked why she was not

## 2012-09-29 IMAGING — CR DG ABDOMEN ACUTE W/ 1V CHEST
3 series · 3 of 3 positions shown · non-contrast
Comparison: 08/27/2011

CLINICAL DATA: Abdominal pain, recent hysterectomy.

ACUTE ABDOMEN SERIES (ABDOMEN 2 VIEW & CHEST 1 VIEW)

[w abdomen decub]
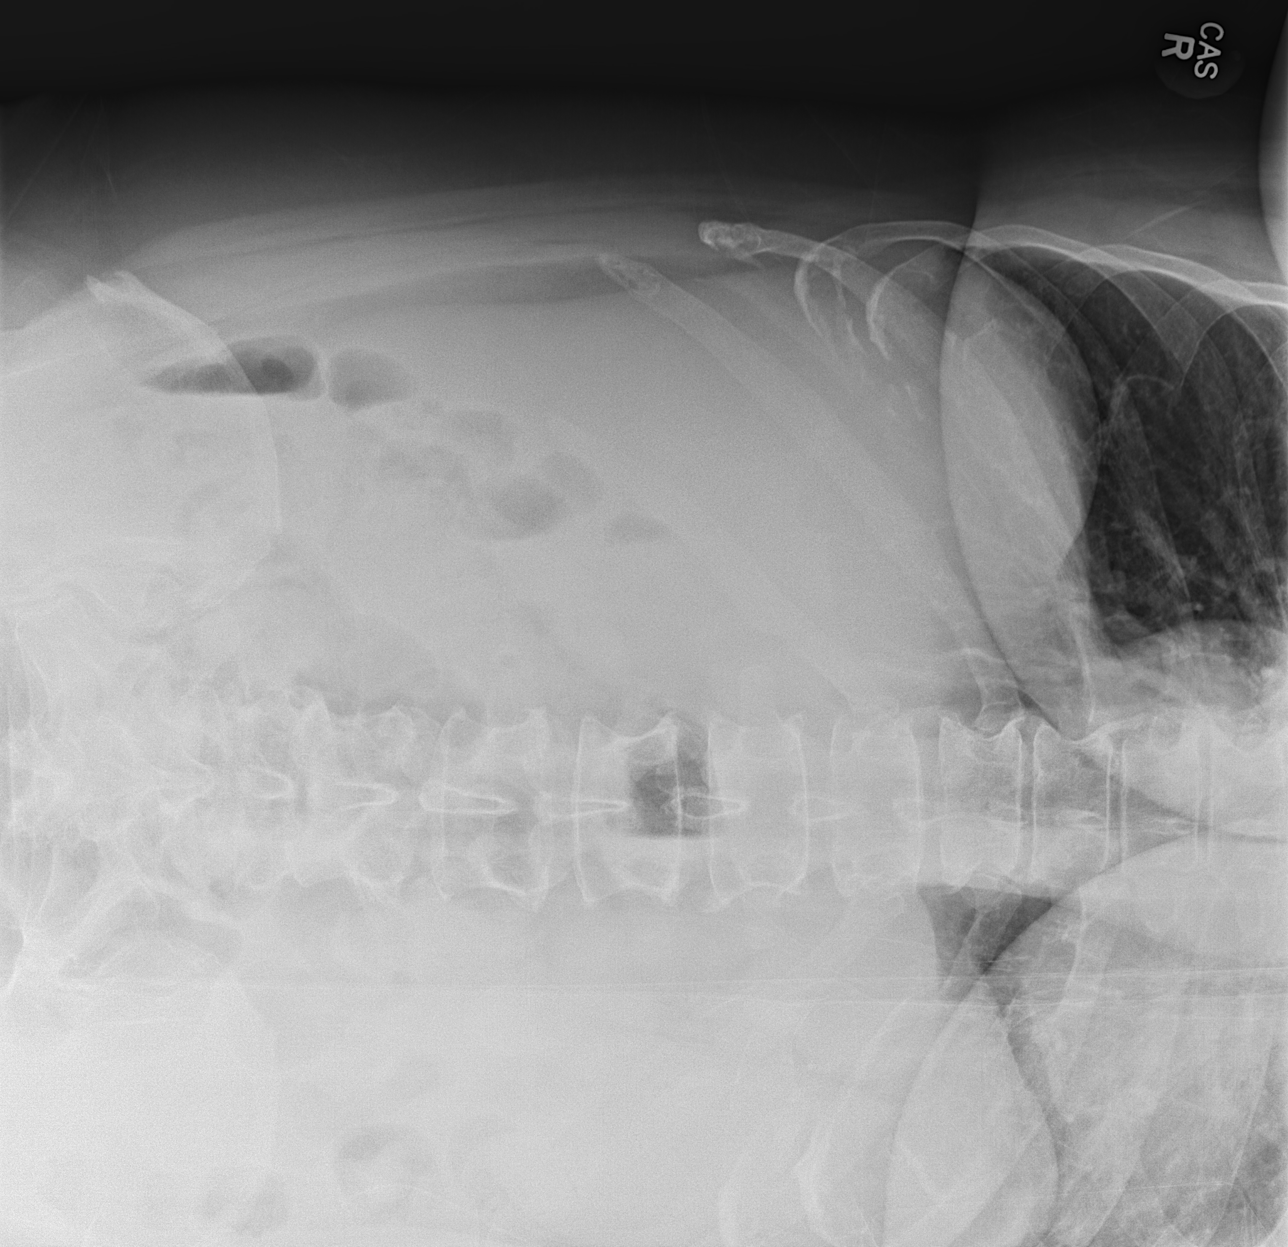

[x abdomen supine]
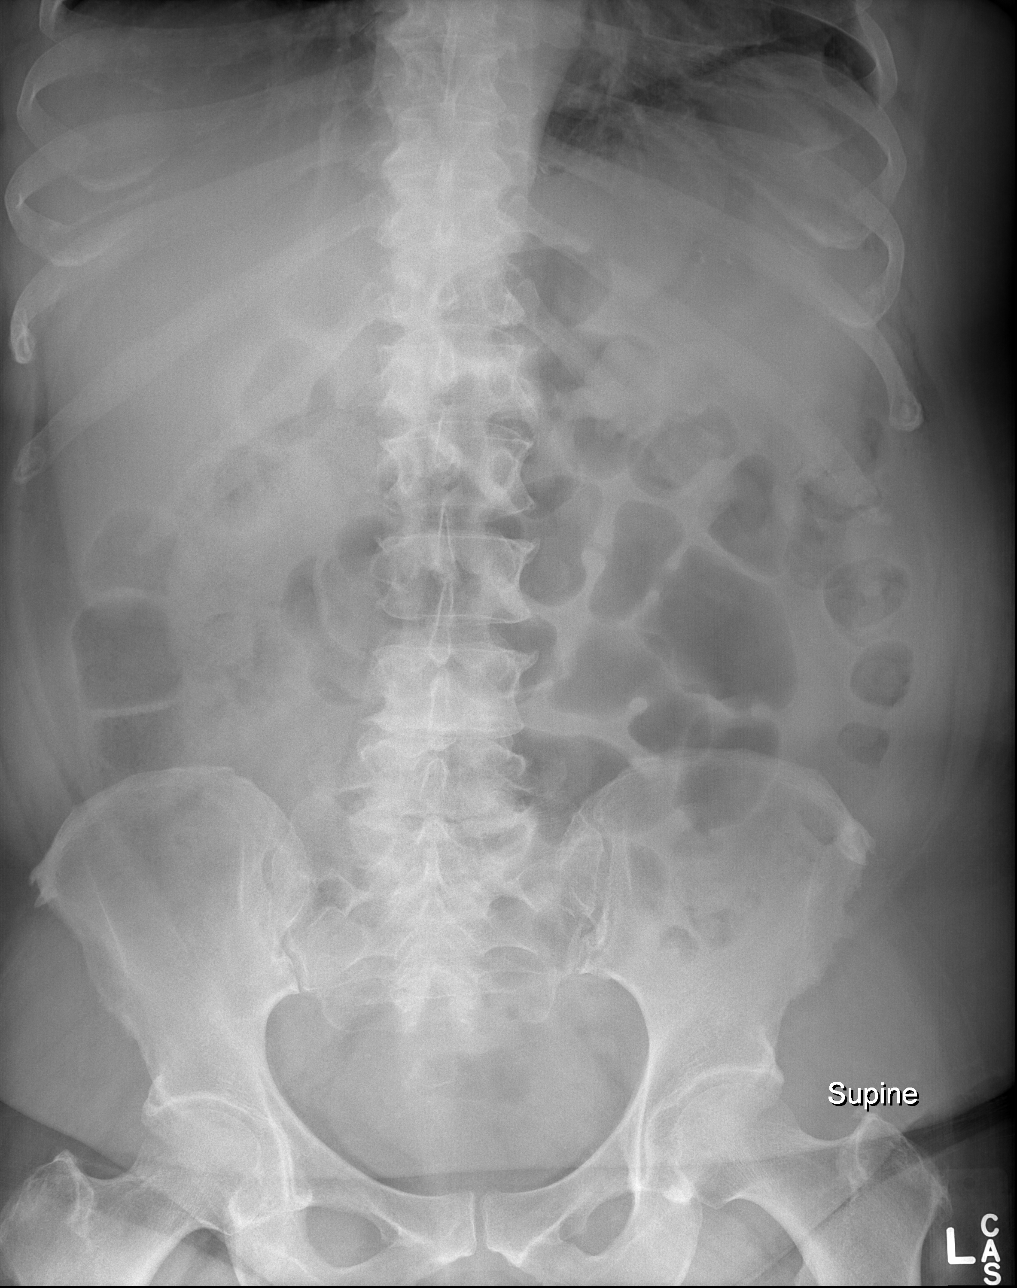

[x chest ap]
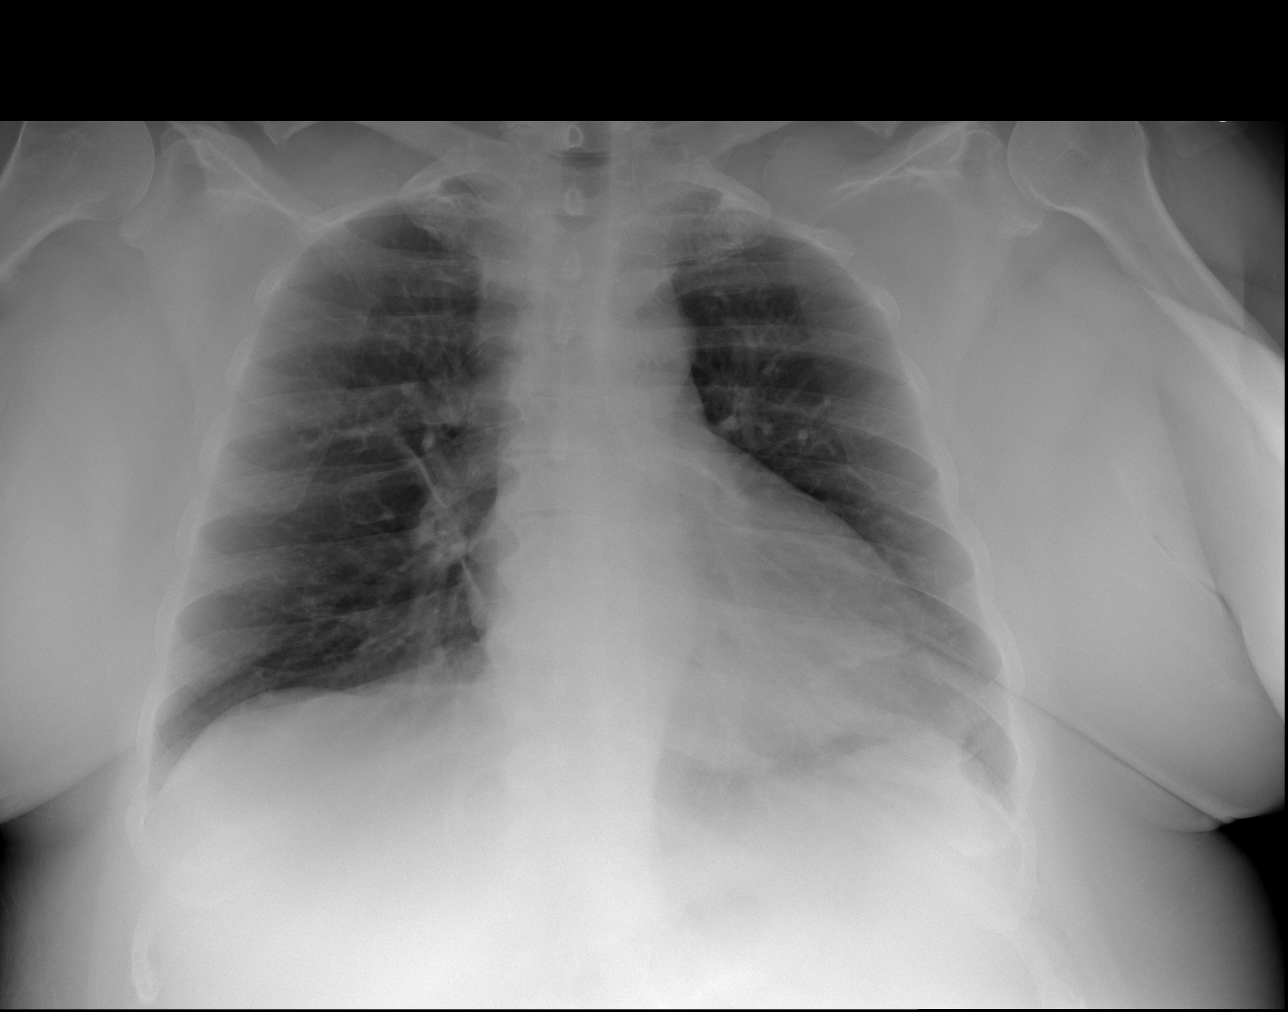

[3 of 3 positions shown; findings below may reference images not displayed]

FINDINGS: Heart size upper normal to mildly enlarged.  Mild
retrocardiac opacity.  Nonobstructive bowel gas pattern.  A small
amount of free intraperitoneal air is suggested, not necessarily
unexpected in the setting of recent surgery.  No acute osseous
finding.
IMPRESSION: Nonobstructive bowel gas pattern.

Small amount of free intraperitoneal air is suggested, not
unexpected in the setting of recent surgery.

Retrocardiac opacity; atelectasis versus infiltrate.

## 2012-09-30 IMAGING — CT CT HEAD WO/W CM
1 of 2 series · 13 of 30 positions shown, 17 images · IV contrast (OMNIPAQUE)
Comparison: CT and MRI 10/10/2011

CLINICAL DATA: Headache.  Possible meningitis.  Endometrial cancer.

CT HEAD WITHOUT AND WITH CONTRAST
TECHNIQUE: Contiguous axial images were obtained from the base of
the skull through the vertex without and with intravenous contrast.
Contrast: 100mL OMNIPAQUE IOHEXOL 300 MG/ML  SOLN

[Series 2: head w/o 4.8 h45s · axial · non-contrast · 0.43mm/px · z∈[+1222,+1336]mm · 13 of 30 slices shown, 17 images]
[im 3/30  brain]
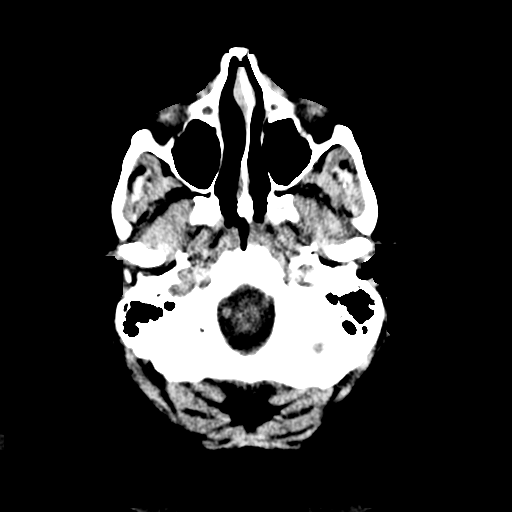
[im 3/30  bone]
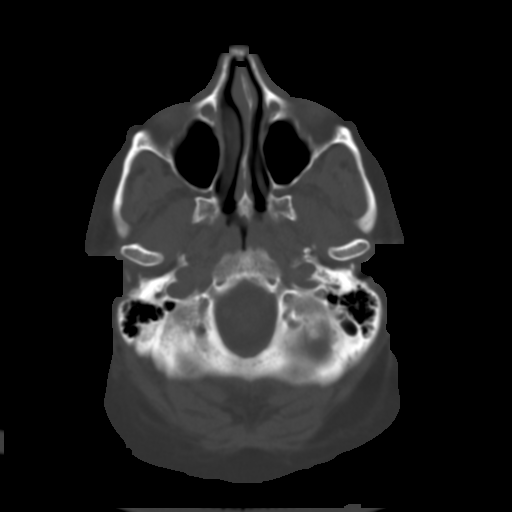
[im 5/30  brain]
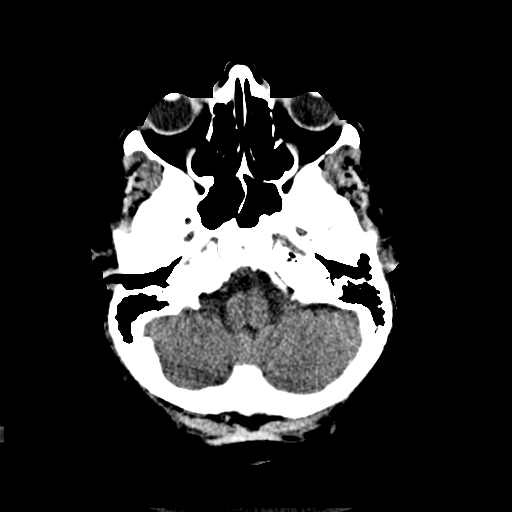
[im 7/30  brain]
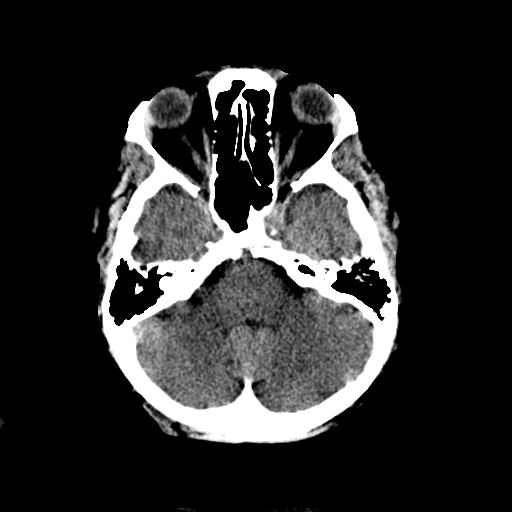
[im 9/30  brain]
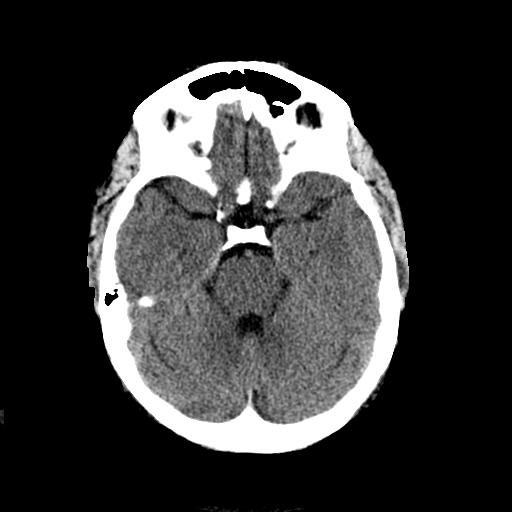
[im 11/30  brain]
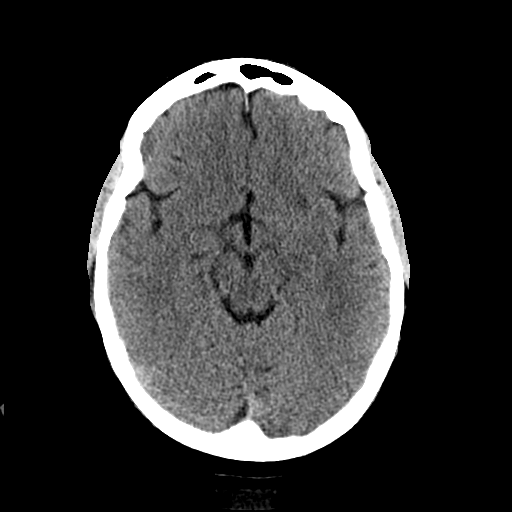
[im 11/30  bone]
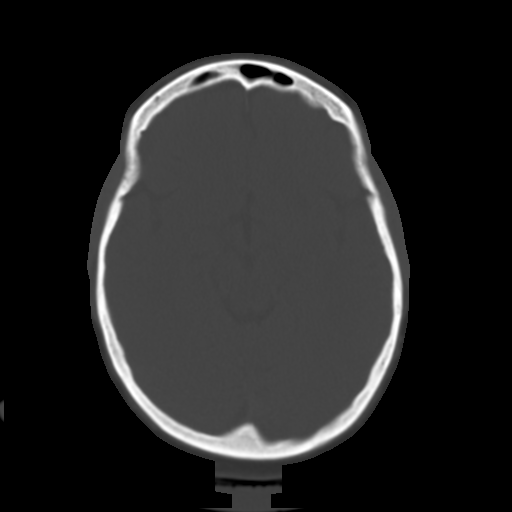
[im 13/30  brain]
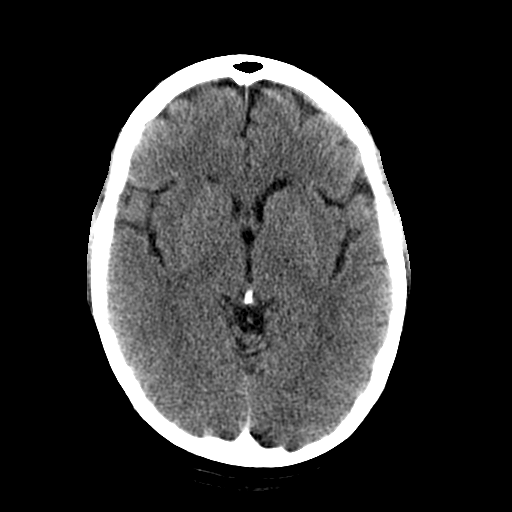
[im 15/30  brain]
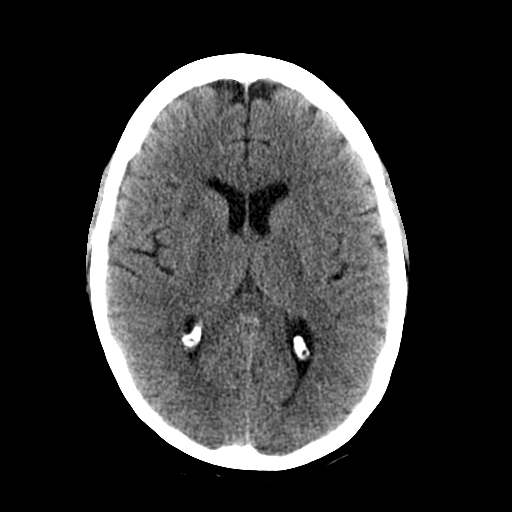
[im 17/30  brain]
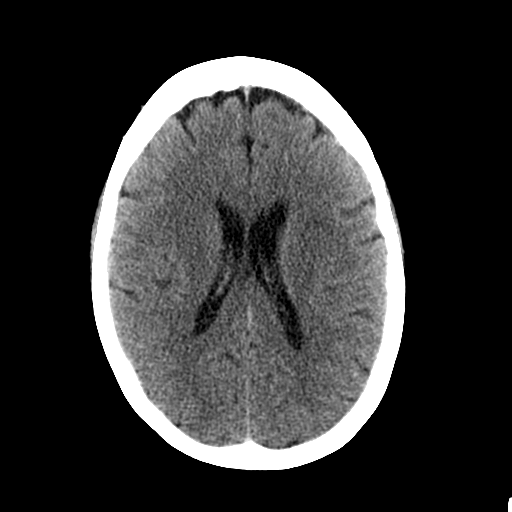
[im 19/30  brain]
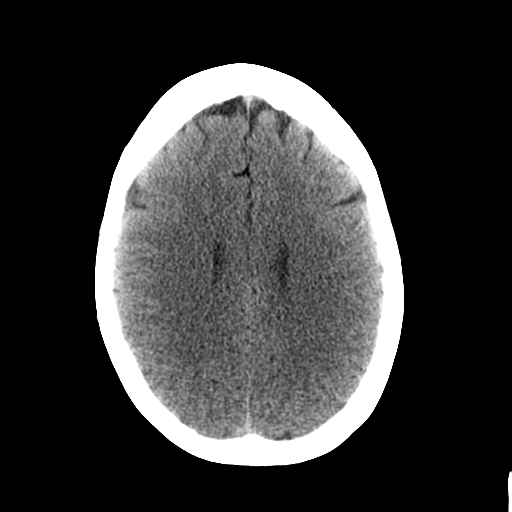
[im 19/30  bone]
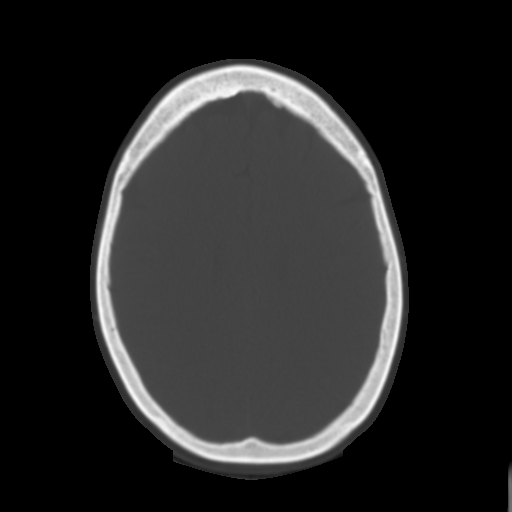
[im 21/30  brain]
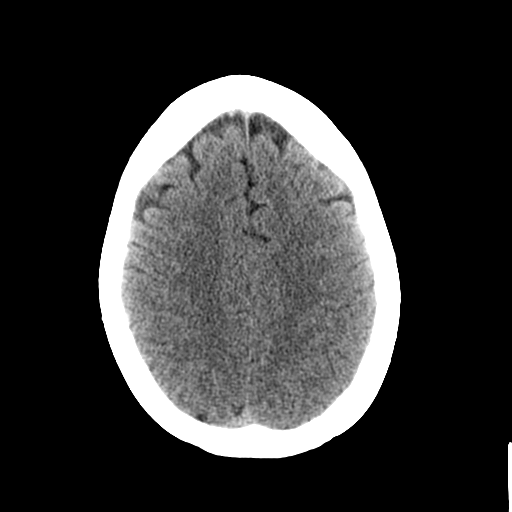
[im 23/30  brain]
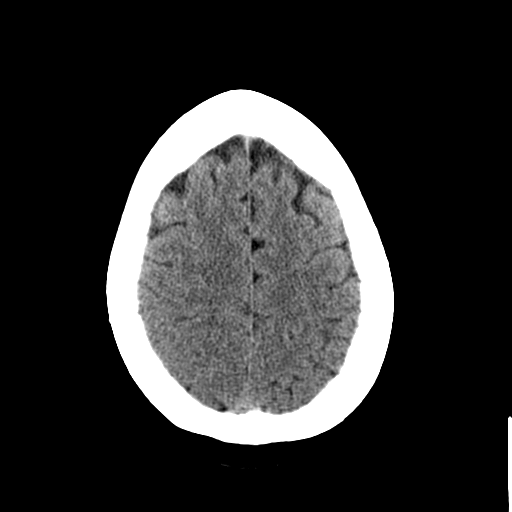
[im 25/30  brain]
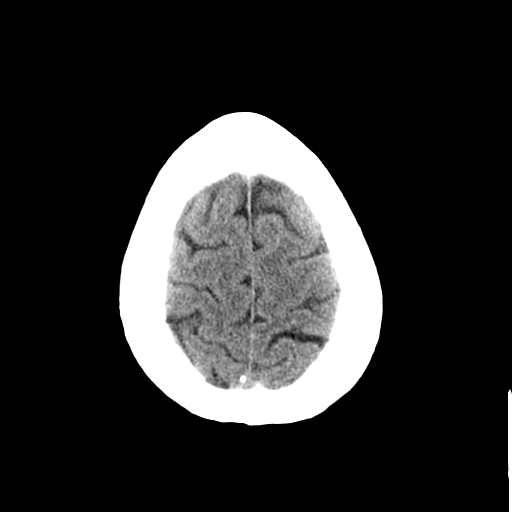
[im 27/30  brain]
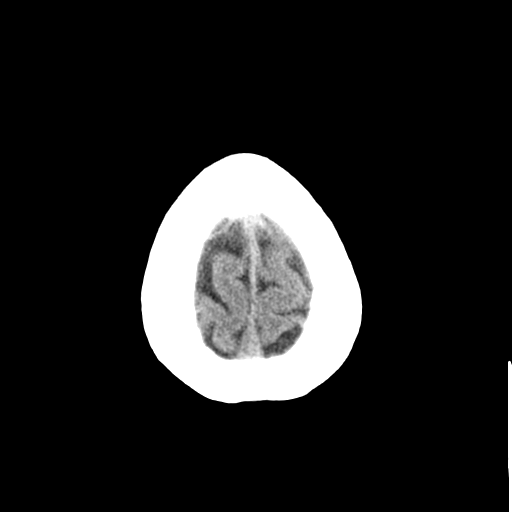
[im 27/30  bone]
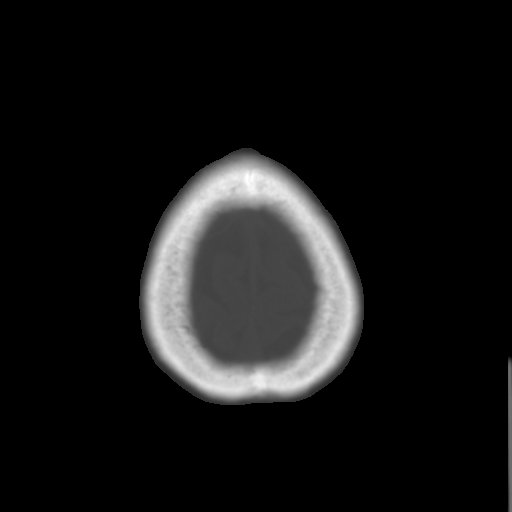

[13 of 30 positions shown; findings below may reference images not displayed]

FINDINGS: Small hypodensity left frontal temporal white matter is
unchanged.  Mild hypodensity in the left frontal deep white matter
is unchanged.  These areas do not enhance.  They are most likely
due to chronic microvascular ischemia.

Negative for acute infarct.  Negative for intracranial hemorrhage
or mass.  Ventricle size is normal and there is no midline shift.

Postcontrast imaging reveals normal enhancement.  No enhancing mass
is present.  No abnormal dural enhancement is present.  Calvarium
intact.
IMPRESSION: Small white matter hypodensities on the left are stable from the
prior MRI.  No acute infarct or abnormal enhancement.

## 2012-10-20 ENCOUNTER — Encounter (HOSPITAL_COMMUNITY): Payer: Self-pay | Admitting: *Deleted

## 2012-10-20 DIAGNOSIS — G8929 Other chronic pain: Secondary | ICD-10-CM | POA: Insufficient documentation

## 2012-10-20 DIAGNOSIS — F411 Generalized anxiety disorder: Secondary | ICD-10-CM | POA: Insufficient documentation

## 2012-10-20 DIAGNOSIS — I10 Essential (primary) hypertension: Secondary | ICD-10-CM | POA: Insufficient documentation

## 2012-10-20 DIAGNOSIS — R51 Headache: Secondary | ICD-10-CM | POA: Insufficient documentation

## 2012-10-20 DIAGNOSIS — IMO0001 Reserved for inherently not codable concepts without codable children: Secondary | ICD-10-CM | POA: Insufficient documentation

## 2012-10-20 DIAGNOSIS — F329 Major depressive disorder, single episode, unspecified: Secondary | ICD-10-CM | POA: Insufficient documentation

## 2012-10-20 DIAGNOSIS — F3289 Other specified depressive episodes: Secondary | ICD-10-CM | POA: Insufficient documentation

## 2012-10-20 DIAGNOSIS — R350 Frequency of micturition: Secondary | ICD-10-CM | POA: Insufficient documentation

## 2012-10-20 DIAGNOSIS — Z8739 Personal history of other diseases of the musculoskeletal system and connective tissue: Secondary | ICD-10-CM | POA: Insufficient documentation

## 2012-10-20 DIAGNOSIS — Z8544 Personal history of malignant neoplasm of other female genital organs: Secondary | ICD-10-CM | POA: Insufficient documentation

## 2012-10-20 DIAGNOSIS — M4716 Other spondylosis with myelopathy, lumbar region: Secondary | ICD-10-CM | POA: Insufficient documentation

## 2012-10-20 DIAGNOSIS — R509 Fever, unspecified: Secondary | ICD-10-CM | POA: Insufficient documentation

## 2012-10-20 DIAGNOSIS — Z79899 Other long term (current) drug therapy: Secondary | ICD-10-CM | POA: Insufficient documentation

## 2012-10-20 DIAGNOSIS — M4800 Spinal stenosis, site unspecified: Secondary | ICD-10-CM | POA: Insufficient documentation

## 2012-10-20 DIAGNOSIS — N390 Urinary tract infection, site not specified: Secondary | ICD-10-CM | POA: Insufficient documentation

## 2012-10-20 LAB — POCT I-STAT, CHEM 8
BUN: 10 mg/dL (ref 6–23)
Chloride: 104 mEq/L (ref 96–112)
Creatinine, Ser: 0.8 mg/dL (ref 0.50–1.10)
Hemoglobin: 16 g/dL — ABNORMAL HIGH (ref 12.0–15.0)
Potassium: 3.9 mEq/L (ref 3.5–5.1)
Sodium: 144 mEq/L (ref 135–145)

## 2012-10-20 MED ORDER — IBUPROFEN 200 MG PO TABS
400.0000 mg | ORAL_TABLET | Freq: Once | ORAL | Status: AC
  Administered 2012-10-20: 400 mg via ORAL
  Filled 2012-10-20: qty 2

## 2012-10-20 MED ORDER — OXYCODONE-ACETAMINOPHEN 5-325 MG PO TABS
1.0000 | ORAL_TABLET | Freq: Once | ORAL | Status: AC
  Administered 2012-10-20: 1 via ORAL
  Filled 2012-10-20: qty 1

## 2012-10-20 NOTE — ED Notes (Addendum)
C/o HA, fever, urinary frequency and bilateral low back pain (L>R) and lower mid abd pain. C/c is back pain. No meds taken for sx today. Pt having chronic and acute pain. H/o UTI. Pt restless, and intermitently crying.

## 2012-10-21 ENCOUNTER — Emergency Department (HOSPITAL_COMMUNITY)
Admission: EM | Admit: 2012-10-21 | Discharge: 2012-10-21 | Disposition: A | Discharge status: Home / Self Care | Payer: Medicaid Other | Attending: Emergency Medicine | Admitting: Emergency Medicine

## 2012-10-21 ENCOUNTER — Encounter (HOSPITAL_COMMUNITY): Payer: Self-pay | Admitting: Radiology

## 2012-10-21 ENCOUNTER — Emergency Department (HOSPITAL_COMMUNITY): Payer: Medicaid Other

## 2012-10-21 DIAGNOSIS — M549 Dorsalgia, unspecified: Secondary | ICD-10-CM

## 2012-10-21 DIAGNOSIS — N390 Urinary tract infection, site not specified: Secondary | ICD-10-CM

## 2012-10-21 LAB — URINALYSIS, ROUTINE W REFLEX MICROSCOPIC
Nitrite: NEGATIVE
Specific Gravity, Urine: 1.026 (ref 1.005–1.030)
Urobilinogen, UA: 0.2 mg/dL (ref 0.0–1.0)
pH: 5 (ref 5.0–8.0)

## 2012-10-21 LAB — CBC WITH DIFFERENTIAL/PLATELET
Eosinophils Absolute: 0.4 10*3/uL (ref 0.0–0.7)
Hemoglobin: 16 g/dL — ABNORMAL HIGH (ref 12.0–15.0)
Lymphocytes Relative: 36 % (ref 12–46)
Lymphs Abs: 3.5 10*3/uL (ref 0.7–4.0)
Monocytes Relative: 7 % (ref 3–12)
Neutro Abs: 5.2 10*3/uL (ref 1.7–7.7)
Neutrophils Relative %: 53 % (ref 43–77)
Platelets: 224 10*3/uL (ref 150–400)
RBC: 5.19 MIL/uL — ABNORMAL HIGH (ref 3.87–5.11)
WBC: 9.8 10*3/uL (ref 4.0–10.5)

## 2012-10-21 LAB — URINE MICROSCOPIC-ADD ON

## 2012-10-21 LAB — POCT I-STAT TROPONIN I: Troponin i, poc: 0.01 ng/mL (ref 0.00–0.08)

## 2012-10-21 MED ORDER — PHENAZOPYRIDINE HCL 100 MG PO TABS
200.0000 mg | ORAL_TABLET | Freq: Once | ORAL | Status: AC
  Administered 2012-10-21: 200 mg via ORAL
  Filled 2012-10-21: qty 2

## 2012-10-21 MED ORDER — NITROFURANTOIN MONOHYD MACRO 100 MG PO CAPS: 100.0000 mg | ORAL_CAPSULE | Freq: Two times a day (BID) | ORAL | Status: DC

## 2012-10-21 MED ORDER — NITROFURANTOIN MONOHYD MACRO 100 MG PO CAPS
100.0000 mg | ORAL_CAPSULE | Freq: Once | ORAL | Status: AC
  Administered 2012-10-21: 100 mg via ORAL
  Filled 2012-10-21: qty 1

## 2012-10-21 MED ORDER — HYDROMORPHONE HCL PF 1 MG/ML IJ SOLN
1.0000 mg | Freq: Once | INTRAMUSCULAR | Status: AC
  Administered 2012-10-21: 1 mg via INTRAVENOUS
  Filled 2012-10-21: qty 1

## 2012-10-21 MED ORDER — ONDANSETRON HCL 4 MG/2ML IJ SOLN
4.0000 mg | Freq: Once | INTRAMUSCULAR | Status: AC
  Administered 2012-10-21: 4 mg via INTRAVENOUS
  Filled 2012-10-21: qty 2

## 2012-10-21 MED ORDER — DIAZEPAM 5 MG/ML IJ SOLN
2.5000 mg | Freq: Once | INTRAMUSCULAR | Status: AC
  Administered 2012-10-21: 2.5 mg via INTRAVENOUS
  Filled 2012-10-21: qty 2

## 2012-10-21 MED ORDER — OXYCODONE-ACETAMINOPHEN 5-325 MG PO TABS: 1.0000 | ORAL_TABLET | Freq: Four times a day (QID) | ORAL | Status: DC | PRN

## 2012-10-21 MED ORDER — IOHEXOL 300 MG/ML  SOLN
100.0000 mL | Freq: Once | INTRAMUSCULAR | Status: AC | PRN
  Administered 2012-10-21: 100 mL via INTRAVENOUS

## 2012-10-21 NOTE — ED Notes (Addendum)
Pt states unable to move in the bed due to back pain. Pt states that the percocet and ibuprofen do not work for her. Pt states that her pain has increased for several weeks. Pt requesting CT scan for pain. Pt alert and oriented.

## 2012-10-21 NOTE — ED Notes (Signed)
Went to d/c Pt. But she stated that she was driving and could not drive herself home and had no one to pick her up. Asked her had she informed MD of this prior to medication administration and she stated she had not done so. MD, AD and charge nurse made aware. Pt to rest in room and will D/C at another time.

## 2012-10-21 NOTE — ED Notes (Signed)
Pt ambulated down hallway and around pod D to go to Bathroom. Pt laid back down on the bed and stated that she was still in pain. Pt was laying on her right side and when I walked in pt rolled to her back and stated she was still in pain.

## 2012-10-21 NOTE — ED Notes (Signed)
Pt. Up to BR prior to med administration. Pt. Was able to ambulate to BR with ease.

## 2012-10-21 NOTE — ED Notes (Signed)
New and Old EKG given to Dr Bonk. 

## 2012-10-22 LAB — URINE CULTURE

## 2012-10-22 NOTE — ED Provider Notes (Signed)
CSN: [id number]     Arrival date & time 10/20/12  2227 History     First MD Initiated Contact with Patient 10/21/12 0033     Chief Complaint  Patient presents with  . Back Pain  . Headache  . Urinary Frequency  . Fever   HPI [name] is a 57 y.o. female presents with back pain. Just complains of headache, subjective fever, urinary frequency. She says her back pain is chronic, she has no numbness or tingling, no saddle anesthesia, no loss of bowel or bladder functions including continence, no urinary retention. She denies dysuria. She denies abdominal pain, shortness of breath, recent cough, recent illness. Denies any chest pains. Back pain is severe, has worsened recently, she has not call hurting her back again, she has been dismissed from neurosurgery practice as well as chronic pain management for failure to comply with clinic visits.   Past Medical History  Diagnosis Date  . Thyroid disease   . Arthritis   . Asthma   . Depression   . Fibromyalgia   . Lumbar spondylolysis   . Allergy   . Endometrial cancer   . Spinal stenosis   . Hypertension   . Dysrhythmia     OCCAS PAC'S AND PVC'S  PER HOLTER MONITOR STUDY --PT HAS OCCAS CHEST PAINS AND PALPITATIONS--HAD ECHO NORMAL LV SIZE AND FUNCTION.  Marland Kitchen Sleep apnea     HAS CPAP MACHINE BUT DOES NOT USE  . Anxiety     HAS OCD AND ON DISABILITY-PER [name]    Past Surgical History  Procedure Laterality Date  . Cesarean section    . Abdominal hysterectomy     Family History  Problem Relation Age of Onset  . Hypertension Mother   . Coronary artery disease Father   . Cancer Brother     Renal cell carcinoma  . Hypertension Other   . Cancer Other   . Heart attack Father   . Coronary artery disease Brother    History  Substance Use Topics  . Smoking status: Never Smoker   . Smokeless tobacco: Never Used  . Alcohol Use: No   OB History   Grav Para Term Preterm Abortions TAB SAB Ect Mult Living   1 1              Review of Systems At least 10pt or greater review of systems completed and are negative except where specified in the HPI.  Allergies  Sulfa antibiotics  Home Medications   Current Outpatient Rx  Name  Route  Sig  Dispense  Refill  . acetaminophen (TYLENOL) 500 MG tablet   Oral   Take 1,000 mg by mouth every 6 (six) hours as needed for pain.         . Ascorbic Acid (VITAMIN C PO)   Oral   Take 1 tablet by mouth daily. Ester C 500         . estradiol (ESTRACE) 0.5 MG tablet   Oral   Take 0.5 mg by mouth every evening.         Marland Kitchen ibuprofen (ADVIL,MOTRIN) 200 MG tablet   Oral   Take 800 mg by mouth daily as needed for pain.         Marland Kitchen levothyroxine (SYNTHROID, LEVOTHROID) 150 MCG tablet   Oral   Take 150 mcg by mouth daily before breakfast.         . LORazepam (ATIVAN) 1 MG tablet   Oral   Take  1 tablet (1 mg total) by mouth 2 (two) times daily.   30 tablet   0   . Methylsulfonylmethane (MSM) 1000 MG CAPS   Oral   Take 1,000 mg by mouth daily.         . Naphazoline HCl (CLEAR EYES OP)   Both Eyes   Place 3 drops into both eyes daily as needed (for redness).         . sertraline (ZOLOFT) 100 MG tablet   Oral   Take 200 mg by mouth every evening.          . thiothixene (NAVANE) 2 MG capsule   Oral   Take 1 capsule (2 mg total) by mouth at bedtime.   10 capsule   0   . nitrofurantoin, macrocrystal-monohydrate, (MACROBID) 100 MG capsule   Oral   Take 1 capsule (100 mg total) by mouth 2 (two) times daily.   10 capsule   0   . oxyCODONE-acetaminophen (PERCOCET/ROXICET) 5-325 MG per tablet   Oral   Take 1-2 tablets by mouth every 6 (six) hours as needed for pain.   17 tablet   0    BP 105/59  Pulse 99  Temp(Src) 99.5 F (37.5 C) (Oral)  Resp 12  SpO2 97% Physical Exam  Nursing notes reviewed.  Electronic medical record reviewed. VITAL SIGNS:   Filed Vitals:   10/21/12 0436 10/21/12 0515 10/21/12 0615 10/21/12 0645  BP: 122/75  112/73 107/64 105/59  Pulse:  96 99 99  Temp:      TempSrc:      Resp: 15 13 13 12   SpO2: 97% 96% 96% 97%   CONSTITUTIONAL: Awake, oriented, tearful, histrionic HENT: Atraumatic, normocephalic, oral mucosa pink and moist, airway patent. Nares patent without drainage. External ears normal. EYES: Conjunctiva clear, EOMI, PERRLA NECK: Trachea midline, non-tender, supple CARDIOVASCULAR: Normal heart rate, Normal rhythm, No murmurs, rubs, gallops PULMONARY/CHEST: Clear to auscultation, no rhonchi, wheezes, or rales. Symmetrical breath sounds. Non-tender. ABDOMINAL: Non-distended, soft, non-tender - no rebound or guarding.  BS normal. BACK: No step-offs or deformities, nontender to palpation in the midline, no skin abnormalities. Patient has tenderness bilaterally in the paraspinous lumbar muscles surrounding L5, extending into the superior gluteal muscles. Negative straight leg test. NEUROLOGIC: Non-focal, moving all four extremities, she has 5 out of 5 strength in the lower extremities and no no gross sensory deficits. EXTREMITIES: No clubbing, cyanosis, or edema SKIN: Warm, Dry, No erythema, No rash  ED Course   Procedures (including critical care time)  Labs Reviewed  URINALYSIS, ROUTINE W REFLEX MICROSCOPIC - Abnormal; Notable for the following:    APPearance CLOUDY (*)    Ketones, ur 15 (*)    Leukocytes, UA LARGE (*)    All other components within normal limits  CBC WITH DIFFERENTIAL - Abnormal; Notable for the following:    RBC 5.19 (*)    Hemoglobin 16.0 (*)    MCHC 36.4 (*)    All other components within normal limits  URINE MICROSCOPIC-ADD ON - Abnormal; Notable for the following:    Squamous Epithelial / LPF MANY (*)    Bacteria, UA MANY (*)    All other components within normal limits  POCT I-STAT, CHEM 8 - Abnormal; Notable for the following:    Glucose, Bld 125 (*)    Hemoglobin 16.0 (*)    HCT 47.0 (*)    All other components within normal limits  URINE CULTURE   POCT I-STAT TROPONIN I   Ct  Abdomen Pelvis W Contrast  10/21/2012   *RADIOLOGY REPORT*  Clinical Data: Back pain, urinary frequency, and fever.  Mid abdominal pain.  Chronic and acute pain.  CT ABDOMEN AND PELVIS WITH CONTRAST  Technique:  Multidetector CT imaging of the abdomen and pelvis was performed following the standard protocol during bolus administration of intravenous contrast.  Contrast: OMNIPAQUE IOHEXOL 300 MG/ML  SOLN  Comparison: 08/23/2011  Findings: The lung bases are clear.  Diffuse fatty infiltration of the liver.  The gallbladder, spleen, pancreas, adrenal glands, kidneys, abdominal aorta, inferior vena cava, and retroperitoneal lymph nodes are unremarkable.  The stomach is distended with food. No wall thickening.  Small bowel are decompressed.  Stool filled colon without distension.  No free air or free fluid in the abdomen.  Minimal umbilical hernia containing fat.  Pelvis:  The appendix is normal.  Sigmoid colon is decompressed without evidence of diverticulitis.  Bladder wall is not thickened. Uterus is surgically absent.  No abnormal adnexal masses.  No significant pelvic lymphadenopathy.  No free or loculated pelvic fluid collections.  Normal alignment of the lumbar vertebrae.  Degenerative changes throughout the lumbar spine with narrowed lumbar interspaces and assisted endplate hypertrophic changes.  Degenerative disc disease at L4-5 and L5-S1.  Degenerative changes in the lumbar facet joints.  No vertebral compression deformities.  No focal bone lesion or bone destruction.  No paraspinal soft tissue infiltration.  IMPRESSION: No acute process demonstrated in the abdomen or pelvis.  Diffuse fatty infiltration of the liver.  Degenerative changes in the lumbar spine.  Normal alignment of the lumbar spine without displaced fracture identified.   Original Report Authenticated By: Burman Nieves, M.D.   1. Back pain, chronic   2. UTI (lower urinary tract infection)     Medications  oxyCODONE-acetaminophen (PERCOCET/ROXICET) 5-325 MG per tablet 1 tablet (1 tablet Oral Given 10/20/12 2317)  ibuprofen (ADVIL,MOTRIN) tablet 400 mg (400 mg Oral Given 10/20/12 2317)  HYDROmorphone (DILAUDID) injection 1 mg (1 mg Intravenous Given 10/21/12 0151)  ondansetron (ZOFRAN) injection 4 mg (4 mg Intravenous Given 10/21/12 0151)  diazepam (VALIUM) injection 2.5 mg (2.5 mg Intravenous Given 10/21/12 0152)  iohexol (OMNIPAQUE) 300 MG/ML solution 100 mL (100 mLs Intravenous Contrast Given 10/21/12 0157)  nitrofurantoin (macrocrystal-monohydrate) (MACROBID) capsule 100 mg (100 mg Oral Given 10/21/12 0409)  phenazopyridine (PYRIDIUM) tablet 200 mg (200 mg Oral Given 10/21/12 0409)  HYDROmorphone (DILAUDID) injection 1 mg (1 mg Intravenous Given 10/21/12 0450)  diazepam (VALIUM) injection 2.5 mg (2.5 mg Intravenous Given 10/21/12 0450)    MDM  Patient treated for acute back pain. I saw this patient multiple times during her ER visit. Initially, there was concern for possible red flags including subjective fevers with her back pain. Does have a history of lumbar stenosis, she has no recent history of trauma. Obtain imaging of the back to further quantify with the patient's history of frequency, and back pain -  With concerns for pyelonephritis versus abscess versus osteomyelitis of the back. Radiologist assured new imaging of the lumbar spine with CT of the abdomen and pelvis.  Patient does not have an elevated white count, possible urinary tract infection-there are many squamous epithelial cells and too numerous to count white blood cells, think this is possible contaminated with vaginal effluence. Patient does have degenerative changes by CT of the lumbar spine.  On repeat visits for the patient to assess her pain and progress, I stopped outside her door at which point there was no noises heard. As  soon as I stepped into the room the patient started crying histrionically about the severity  of her pain. There was a discordance between what the nurses observing him how the patient was acting for me in the severity of her pain. Pain was treated however I think there may be a drug-seeking component to this patient's presentation. She has been dismissed from chronic pain clinics for failure to comply with clinic visits etc.  We'll treat patient for UTI, culture urine.  Patient will not be sent home with any narcotics.  Patient is discharged home stable and good condition, she will followup with primary care-I've given her alternative care providers to followup with to refer her to chronic pain management.    Jones Skene, MD 10/23/12 [phone]

## 2012-12-08 ENCOUNTER — Encounter (HOSPITAL_COMMUNITY): Payer: Self-pay | Admitting: Emergency Medicine

## 2012-12-08 DIAGNOSIS — J45909 Unspecified asthma, uncomplicated: Secondary | ICD-10-CM | POA: Insufficient documentation

## 2012-12-08 DIAGNOSIS — E079 Disorder of thyroid, unspecified: Secondary | ICD-10-CM | POA: Insufficient documentation

## 2012-12-08 DIAGNOSIS — Z79899 Other long term (current) drug therapy: Secondary | ICD-10-CM | POA: Insufficient documentation

## 2012-12-08 DIAGNOSIS — Z8739 Personal history of other diseases of the musculoskeletal system and connective tissue: Secondary | ICD-10-CM | POA: Insufficient documentation

## 2012-12-08 DIAGNOSIS — R51 Headache: Secondary | ICD-10-CM | POA: Insufficient documentation

## 2012-12-08 DIAGNOSIS — Z8542 Personal history of malignant neoplasm of other parts of uterus: Secondary | ICD-10-CM | POA: Insufficient documentation

## 2012-12-08 DIAGNOSIS — F3289 Other specified depressive episodes: Secondary | ICD-10-CM | POA: Insufficient documentation

## 2012-12-08 DIAGNOSIS — Z8679 Personal history of other diseases of the circulatory system: Secondary | ICD-10-CM | POA: Insufficient documentation

## 2012-12-08 DIAGNOSIS — F329 Major depressive disorder, single episode, unspecified: Secondary | ICD-10-CM | POA: Insufficient documentation

## 2012-12-08 DIAGNOSIS — F411 Generalized anxiety disorder: Secondary | ICD-10-CM | POA: Insufficient documentation

## 2012-12-08 DIAGNOSIS — G4730 Sleep apnea, unspecified: Secondary | ICD-10-CM | POA: Insufficient documentation

## 2012-12-08 DIAGNOSIS — I10 Essential (primary) hypertension: Secondary | ICD-10-CM | POA: Insufficient documentation

## 2012-12-08 NOTE — ED Notes (Signed)
Patient with headache for one month.  Patient seen by PCP one week ago.  No photophobia, no nausea or vomiting.  Per EMS, patient is convinced that she has a tumor.

## 2012-12-09 ENCOUNTER — Emergency Department (HOSPITAL_COMMUNITY)
Admission: EM | Admit: 2012-12-09 | Discharge: 2012-12-09 | Disposition: A | Discharge status: Home / Self Care | Payer: Medicaid Other | Attending: Emergency Medicine | Admitting: Emergency Medicine

## 2012-12-09 ENCOUNTER — Emergency Department (HOSPITAL_COMMUNITY): Payer: Medicaid Other

## 2012-12-09 MED ORDER — KETOROLAC TROMETHAMINE 30 MG/ML IJ SOLN
30.0000 mg | Freq: Once | INTRAMUSCULAR | Status: AC
  Administered 2012-12-09: 30 mg via INTRAVENOUS
  Filled 2012-12-09: qty 1

## 2012-12-09 MED ORDER — SODIUM CHLORIDE 0.9 % IV BOLUS (SEPSIS)
1000.0000 mL | Freq: Once | INTRAVENOUS | Status: AC
  Administered 2012-12-09: 1000 mL via INTRAVENOUS

## 2012-12-09 MED ORDER — DEXAMETHASONE SODIUM PHOSPHATE 10 MG/ML IJ SOLN
10.0000 mg | Freq: Once | INTRAMUSCULAR | Status: AC
  Administered 2012-12-09: 10 mg via INTRAVENOUS
  Filled 2012-12-09: qty 1

## 2012-12-09 MED ORDER — PROMETHAZINE HCL 25 MG PO TABS: 25.0000 mg | ORAL_TABLET | Freq: Four times a day (QID) | ORAL | Status: DC | PRN

## 2012-12-09 MED ORDER — DIPHENHYDRAMINE HCL 50 MG/ML IJ SOLN
25.0000 mg | Freq: Once | INTRAMUSCULAR | Status: AC
  Administered 2012-12-09: 25 mg via INTRAVENOUS
  Filled 2012-12-09: qty 0.5

## 2012-12-09 MED ORDER — METOCLOPRAMIDE HCL 5 MG/ML IJ SOLN
10.0000 mg | Freq: Once | INTRAMUSCULAR | Status: AC
  Administered 2012-12-09: 10 mg via INTRAVENOUS
  Filled 2012-12-09: qty 2

## 2012-12-09 NOTE — ED Provider Notes (Signed)
CSN: [id number]     Arrival date & time 12/08/12  2320 History   First MD Initiated Contact with Patient 12/09/12 [phone]     Chief Complaint  Patient presents with  . Headache   (Consider location/radiation/quality/duration/timing/severity/associated sxs/prior Treatment) HPI History provided by pt.   Pt presents w/ c/o constant, waxing and waning, diffuse headache x several months.  Has been worse over the last month.  Has never been evaluated for this problem in the past.  Associated w/ generalized weakness and intermittent room-spinning dizziness that occurs with movement and resolves w/in 1 minute with rest.  Has also had personality changes that she describes as intermittent outbursts of anger and cussing.  Denies fever, vision changes, paresthesias, vomiting.  Denies head trauma.  Per prior chart, pt diagnosed w/ viral meningitis last year.  She reports that current pain is different. She has been seen for headache in the ED twice in the last 4 months.  Had a CT head and LP that was unremarkable on 08/23/12.   Past Medical History  Diagnosis Date  . Thyroid disease   . Arthritis   . Asthma   . Depression   . Fibromyalgia   . Lumbar spondylolysis   . Allergy   . Endometrial cancer   . Spinal stenosis   . Hypertension   . Dysrhythmia     OCCAS PAC'S AND PVC'S  PER HOLTER MONITOR STUDY --PT HAS OCCAS CHEST PAINS AND PALPITATIONS--HAD ECHO NORMAL LV SIZE AND FUNCTION.  Marland Kitchen Sleep apnea     HAS CPAP MACHINE BUT DOES NOT USE  . Anxiety     HAS OCD AND ON DISABILITY-PER [name]    Past Surgical History  Procedure Laterality Date  . Cesarean section    . Abdominal hysterectomy     Family History  Problem Relation Age of Onset  . Hypertension Mother   . Coronary artery disease Father   . Cancer Brother     Renal cell carcinoma  . Hypertension Other   . Cancer Other   . Heart attack Father   . Coronary artery disease Brother    History  Substance Use Topics  . Smoking status:  Never Smoker   . Smokeless tobacco: Never Used  . Alcohol Use: No   OB History   Grav Para Term Preterm Abortions TAB SAB Ect Mult Living   1 1             Review of Systems  All other systems reviewed and are negative.    Allergies  Sulfa antibiotics  Home Medications   Current Outpatient Rx  Name  Route  Sig  Dispense  Refill  . acetaminophen (TYLENOL) 500 MG tablet   Oral   Take 1,000 mg by mouth every 6 (six) hours as needed for pain.         . Ascorbic Acid (VITAMIN C PO)   Oral   Take 1 tablet by mouth daily. Ester C 500         . estradiol (ESTRACE) 0.5 MG tablet   Oral   Take 0.5 mg by mouth every evening.         Marland Kitchen levothyroxine (SYNTHROID, LEVOTHROID) 150 MCG tablet   Oral   Take 150 mcg by mouth daily before breakfast.         . LORazepam (ATIVAN) 1 MG tablet   Oral   Take 1 tablet (1 mg total) by mouth 2 (two) times daily.   30 tablet  0   . losartan (COZAAR) 50 MG tablet   Oral   Take 25 mg by mouth daily.         . Methylsulfonylmethane (MSM) 1000 MG CAPS   Oral   Take 1,000 mg by mouth daily.         . Naphazoline HCl (CLEAR EYES OP)   Both Eyes   Place 3 drops into both eyes daily as needed (for redness).         . sertraline (ZOLOFT) 100 MG tablet   Oral   Take 200 mg by mouth every evening.          . thiothixene (NAVANE) 2 MG capsule   Oral   Take 1 capsule (2 mg total) by mouth at bedtime.   10 capsule   0    BP 137/73  Pulse 91  Temp(Src) 97.6 F (36.4 C) (Oral)  Resp 18  Wt 183 lb (83.008 kg)  BMI 36.94 kg/m2  SpO2 98% Physical Exam  Nursing note and vitals reviewed. Constitutional: She is oriented to person, place, and time. She appears well-developed and well-nourished.  obese  HENT:  Head: Normocephalic and atraumatic.  No tenderness of sinuses or temples.   Eyes:  Normal appearance  Neck: Normal range of motion. Neck supple. No rigidity. No Brudzinski's sign and no Kernig's sign noted.   Cardiovascular: Normal rate, regular rhythm and intact distal pulses.   Pulmonary/Chest: Effort normal and breath sounds normal.  Musculoskeletal: Normal range of motion.  Neurological: She is alert and oriented to person, place, and time. No sensory deficit. Coordination normal.  CN 3-12 intact.  No nystagmus.  5/5 and equal upper and lower extremity strength.  No past pointing.    Skin: Skin is warm and dry. No rash noted.  Psychiatric: She has a normal mood and affect. Her behavior is normal.  Tangential, pressured speech    ED Course  Procedures (including critical care time) Labs Review Labs Reviewed - No data to display Imaging Review Ct Head Wo Contrast  12/09/2012   CLINICAL DATA:  Headache  EXAM: CT HEAD WITHOUT CONTRAST  TECHNIQUE: Contiguous axial images were obtained from the base of the skull through the vertex without intravenous contrast.  COMPARISON:  08/23/2012.  FINDINGS: Skull:No acute osseous abnormality. No lytic or blastic lesion.  Orbits: No acute abnormality.  Brain: No evidence of acute abnormality, such as acute infarction, hemorrhage, hydrocephalus, or mass lesion/mass effect.  Unchanged patchy bilateral cerebral white matter low-attenuation, abnormal for age.  IMPRESSION: 1. No evidence of acute intracranial disease. 2. Chronic white matter changes, usually related to small vessel ischemia.   Electronically Signed   By: Tiburcio Pea   On: 12/09/2012 05:54    MDM   1. Headache    57yo F w/ PMH sig for viral meningitis, chronic pain, HTN and endometrial cancer presents w/ severe, non-traumatic headache x several months.  Associated w/ intermittent dizziness and personality changes.  She is concerned she has a brain tumor and denies ever being evaluated for this problem in the past.  Per prior chart, she has complained of headache in the ED twice since 07/2012 and had a negative head CT and LP the first visit.  On exam today, afebrile, non-toxic appearing, no focal  neuro deficits or meningeal signs.  She was insistent that she have brain imaging today and CT head ordered and pending.  IV reglan, decadron and benadryl ordered for head pain.  Pt is upset with my  choice in analgesics and wants something stronger like dilaudid.  I declined and will reassess shortly.  She also complained of chronic, intermittent, non-exertional CP x several years.  Has not had an episode today.  EKG pending.  CT head negative.  Results discussed w/ pt.  She has had some improvement in pain w/ reglan. Will try 30mg  IV toradol as well.  Laveda Norman, PA-C to resume care.  Pt to be discharged shortly.  6:45 AM   Pt refused EKG  Otilio Miu, PA-C 12/09/12 0645  Otilio Miu, PA-C 12/09/12 [phone]

## 2012-12-09 NOTE — ED Notes (Signed)
Pt Refused EKG 

## 2012-12-09 NOTE — ED Notes (Signed)
Pt c/o ha X one month w/ light sensitivity and "dizziness spells". Pt states over the past month her "personality has changed" she "goes into a rage".

## 2012-12-09 NOTE — ED Provider Notes (Signed)
Medical screening examination/treatment/procedure(s) were performed by non-physician practitioner and as supervising physician I was immediately available for consultation/collaboration.  Darlys Gales, MD 12/09/12 1007

## 2012-12-09 NOTE — ED Notes (Signed)
Pt stated very loudly that she was not happy with the medications that have been ordered for her pain, and that she was not willing to try them unless she was also given dilaudid with them.  She also stated that she must have the dilaudid IV because IM does not work for her.  She stated that she needed a CT scan because she thinks she has a brain tumor, but she would not go to CT without the dilaudid.  ED PA and primary RN made aware

## 2012-12-26 ENCOUNTER — Emergency Department (HOSPITAL_COMMUNITY)
Admission: EM | Admit: 2012-12-26 | Discharge: 2012-12-27 | Disposition: A | Discharge status: Home / Self Care | Payer: Medicaid Other | Attending: Emergency Medicine | Admitting: Emergency Medicine

## 2012-12-26 ENCOUNTER — Encounter (HOSPITAL_COMMUNITY): Payer: Self-pay | Admitting: Emergency Medicine

## 2012-12-26 ENCOUNTER — Emergency Department (HOSPITAL_COMMUNITY): Payer: Medicaid Other

## 2012-12-26 DIAGNOSIS — J45909 Unspecified asthma, uncomplicated: Secondary | ICD-10-CM | POA: Insufficient documentation

## 2012-12-26 DIAGNOSIS — E079 Disorder of thyroid, unspecified: Secondary | ICD-10-CM | POA: Insufficient documentation

## 2012-12-26 DIAGNOSIS — Z8739 Personal history of other diseases of the musculoskeletal system and connective tissue: Secondary | ICD-10-CM | POA: Insufficient documentation

## 2012-12-26 DIAGNOSIS — R141 Gas pain: Secondary | ICD-10-CM | POA: Insufficient documentation

## 2012-12-26 DIAGNOSIS — F411 Generalized anxiety disorder: Secondary | ICD-10-CM | POA: Insufficient documentation

## 2012-12-26 DIAGNOSIS — F3289 Other specified depressive episodes: Secondary | ICD-10-CM | POA: Insufficient documentation

## 2012-12-26 DIAGNOSIS — R142 Eructation: Secondary | ICD-10-CM | POA: Insufficient documentation

## 2012-12-26 DIAGNOSIS — G4730 Sleep apnea, unspecified: Secondary | ICD-10-CM | POA: Insufficient documentation

## 2012-12-26 DIAGNOSIS — Z79899 Other long term (current) drug therapy: Secondary | ICD-10-CM | POA: Insufficient documentation

## 2012-12-26 DIAGNOSIS — Z8542 Personal history of malignant neoplasm of other parts of uterus: Secondary | ICD-10-CM | POA: Insufficient documentation

## 2012-12-26 DIAGNOSIS — Z8679 Personal history of other diseases of the circulatory system: Secondary | ICD-10-CM | POA: Insufficient documentation

## 2012-12-26 DIAGNOSIS — I10 Essential (primary) hypertension: Secondary | ICD-10-CM | POA: Insufficient documentation

## 2012-12-26 DIAGNOSIS — N390 Urinary tract infection, site not specified: Secondary | ICD-10-CM | POA: Insufficient documentation

## 2012-12-26 DIAGNOSIS — F329 Major depressive disorder, single episode, unspecified: Secondary | ICD-10-CM | POA: Insufficient documentation

## 2012-12-26 LAB — CBC WITH DIFFERENTIAL/PLATELET
Basophils Absolute: 0 10*3/uL (ref 0.0–0.1)
Basophils Relative: 0 % (ref 0–1)
Eosinophils Relative: 4 % (ref 0–5)
HCT: 40.6 % (ref 36.0–46.0)
Hemoglobin: 14.9 g/dL (ref 12.0–15.0)
MCHC: 36.7 g/dL — ABNORMAL HIGH (ref 30.0–36.0)
MCV: 84.1 fL (ref 78.0–100.0)
Monocytes Absolute: 0.6 10*3/uL (ref 0.1–1.0)
Monocytes Relative: 7 % (ref 3–12)
RDW: 12.9 % (ref 11.5–15.5)

## 2012-12-26 LAB — URINALYSIS, ROUTINE W REFLEX MICROSCOPIC
Bilirubin Urine: NEGATIVE
Glucose, UA: NEGATIVE mg/dL
Ketones, ur: NEGATIVE mg/dL
Protein, ur: NEGATIVE mg/dL
pH: 5 (ref 5.0–8.0)

## 2012-12-26 LAB — POCT I-STAT TROPONIN I: Troponin i, poc: 0 ng/mL (ref 0.00–0.08)

## 2012-12-26 LAB — URINE MICROSCOPIC-ADD ON

## 2012-12-26 NOTE — ED Notes (Signed)
Pt c/o of being in chair; RN tried to make pt comfortable by offering pillow and blanket and lying bed flat; pt refused stated she needed a bed due to severe back pain; pt standing and walking around in room w/o difficulty; bed pulled from acute side and placed in pt room; pt given pillow and head of bed elevated to comfort;

## 2012-12-26 NOTE — ED Notes (Signed)
Pt. reports chronic mid back pain for several days , denies injury or fall , ambulatory , no urinary discomfort.

## 2012-12-26 NOTE — ED Provider Notes (Signed)
CSN: [id number]     Arrival date & time 12/26/12  1910 History  This chart was scribed for non-physician practitioner Felicie Morn, NP, working with [name], MD by Ronal Fear, ED scribe. This patient was seen in room TR05C/TR05C and the patient's care was started at 10:32 PM.    Chief Complaint  Patient presents with  . Back Pain   Patient is a 57 y.o. female presenting with back pain. The history is provided by the patient. No language interpreter was used.  Back Pain Location:  Generalized Quality:  Aching Radiates to:  Does not radiate Pain severity:  Mild Pain is:  Worse during the day Onset quality:  Sudden Duration:  4 days Timing:  Constant Progression:  Worsening Chronicity:  New Relieved by:  None tried Worsened by:  Movement and ambulation Ineffective treatments:  None tried Associated symptoms: no bladder incontinence, no bowel incontinence, no dysuria, no leg pain and no numbness     HPI Comments: [name] is a 57 y.o. female who presents to the Emergency Department complaining of sudden onset generalized  Gradually worsening back pain onset 4x days ago. Denies dysuria or foul odor in urine  Past Medical History  Diagnosis Date  . Thyroid disease   . Arthritis   . Asthma   . Depression   . Fibromyalgia   . Lumbar spondylolysis   . Allergy   . Endometrial cancer   . Spinal stenosis   . Hypertension   . Dysrhythmia     OCCAS PAC'S AND PVC'S  PER HOLTER MONITOR STUDY --PT HAS OCCAS CHEST PAINS AND PALPITATIONS--HAD ECHO NORMAL LV SIZE AND FUNCTION.  Marland Kitchen Sleep apnea     HAS CPAP MACHINE BUT DOES NOT USE  . Anxiety     HAS OCD AND ON DISABILITY-PER [name]   . Back pain, chronic    Past Surgical History  Procedure Laterality Date  . Cesarean section    . Abdominal hysterectomy     Family History  Problem Relation Age of Onset  . Hypertension Mother   . Coronary artery disease Father   . Cancer Brother     Renal cell carcinoma  .  Hypertension Other   . Cancer Other   . Heart attack Father   . Coronary artery disease Brother    History  Substance Use Topics  . Smoking status: Never Smoker   . Smokeless tobacco: Never Used  . Alcohol Use: No   OB History   Grav Para Term Preterm Abortions TAB SAB Ect Mult Living   1 1             Review of Systems  Gastrointestinal: Negative for bowel incontinence.  Genitourinary: Negative for bladder incontinence, dysuria and difficulty urinating.  Musculoskeletal: Positive for back pain.  Neurological: Negative for numbness.  All other systems reviewed and are negative.    Allergies  Sulfa antibiotics  Home Medications   Current Outpatient Rx  Name  Route  Sig  Dispense  Refill  . acetaminophen (TYLENOL) 500 MG tablet   Oral   Take 1,000 mg by mouth every 6 (six) hours as needed for pain.         . Ascorbic Acid (VITAMIN C PO)   Oral   Take 1 tablet by mouth daily. Ester C 500         . estradiol (ESTRACE) 0.5 MG tablet   Oral   Take 0.5 mg by mouth every evening.         Marland Kitchen  levothyroxine (SYNTHROID, LEVOTHROID) 150 MCG tablet   Oral   Take 150 mcg by mouth daily before breakfast.         . LORazepam (ATIVAN) 1 MG tablet   Oral   Take 1 tablet (1 mg total) by mouth 2 (two) times daily.   30 tablet   0   . Naphazoline HCl (CLEAR EYES OP)   Both Eyes   Place 3 drops into both eyes daily as needed (for redness).         . sertraline (ZOLOFT) 100 MG tablet   Oral   Take 200 mg by mouth every evening.          . thiothixene (NAVANE) 2 MG capsule   Oral   Take 1 capsule (2 mg total) by mouth at bedtime.   10 capsule   0   . Vitamin D, Ergocalciferol, (DRISDOL) 50000 UNITS CAPS capsule   Oral   Take 50,000 Units by mouth every 7 (seven) days.         Marland Kitchen losartan (COZAAR) 50 MG tablet   Oral   Take 25 mg by mouth daily.         . Methylsulfonylmethane (MSM) 1000 MG CAPS   Oral   Take 1,000 mg by mouth daily.          BP  122/74  Pulse 97  Temp(Src) 99.2 F (37.3 C) (Oral)  Resp 14  SpO2 96% Physical Exam  Nursing note and vitals reviewed. Constitutional: She is oriented to person, place, and time. She appears well-developed and well-nourished. No distress.  HENT:  Head: Normocephalic and atraumatic.  Eyes: EOM are normal.  Neck: Neck supple. No tracheal deviation present.  Cardiovascular: Normal rate.   Pulmonary/Chest: Effort normal. No respiratory distress.  Abdominal: Soft. She exhibits distension. There is CVA tenderness.  Musculoskeletal: Normal range of motion. She exhibits tenderness.       Thoracic back: She exhibits no tenderness.       Lumbar back: She exhibits no tenderness.  Generalized back pain. Bilateral flank pain  Neurological: She is alert and oriented to person, place, and time.  Skin: Skin is warm and dry.  Psychiatric: She has a normal mood and affect. Her behavior is normal.    ED Course  Procedures (including critical care time)  Date: 12/26/2012  Rate: 87  Rhythm: normal sinus rhythm  QRS Axis: normal  Intervals: normal  ST/T Wave abnormalities: normal  Conduction Disutrbances:none  Narrative Interpretation:   Old EKG Reviewed: unchanged  DIAGNOSTIC STUDIES: Oxygen Saturation is 96% on RA, adequate by my interpretation.    COORDINATION OF CARE: 10:38 PM- Pt advised of plan for treatment UA, lab test for kidney function, CBC, acute abd series, chest eval and liver enzymes and pt agrees.    Labs Review Labs Reviewed - No data to display Imaging Review No results found. Lab and radiology results reviewed and shared with patient. MDM  Urinary tract infection.  I personally performed the services described in this documentation, which was scribed in my presence. The recorded information has been reviewed and is accurate.     Jimmye Norman, NP 12/27/12 [phone]

## 2012-12-27 LAB — COMPREHENSIVE METABOLIC PANEL
AST: 23 U/L (ref 0–37)
BUN: 10 mg/dL (ref 6–23)
CO2: 24 mEq/L (ref 19–32)
Calcium: 9.5 mg/dL (ref 8.4–10.5)
Creatinine, Ser: 0.61 mg/dL (ref 0.50–1.10)
GFR calc non Af Amer: >90 mL/min (ref >90)
Total Bilirubin: 0.2 mg/dL — ABNORMAL LOW (ref 0.3–1.2)

## 2012-12-27 MED ORDER — KETOROLAC TROMETHAMINE 60 MG/2ML IM SOLN
60.0000 mg | Freq: Once | INTRAMUSCULAR | Status: AC
  Administered 2012-12-27: 60 mg via INTRAMUSCULAR
  Filled 2012-12-27: qty 2

## 2012-12-27 MED ORDER — CEPHALEXIN 500 MG PO CAPS: 500.0000 mg | ORAL_CAPSULE | Freq: Four times a day (QID) | ORAL | Status: DC

## 2012-12-27 MED ORDER — CEPHALEXIN 250 MG PO CAPS
500.0000 mg | ORAL_CAPSULE | Freq: Once | ORAL | Status: AC
  Administered 2012-12-27: 500 mg via ORAL
  Filled 2012-12-27: qty 2

## 2012-12-27 MED ORDER — HYDROCODONE-ACETAMINOPHEN 5-325 MG PO TABS: 1.0000 | ORAL_TABLET | Freq: Four times a day (QID) | ORAL | Status: DC | PRN

## 2012-12-28 LAB — URINE CULTURE: Colony Count: NO GROWTH

## 2012-12-29 NOTE — ED Provider Notes (Signed)
Medical screening examination/treatment/procedure(s) were performed by non-physician practitioner and as supervising physician I was immediately available for consultation/collaboration.  Briget Shaheed T Carmello Cabiness, MD 12/29/12 0707 

## 2013-01-02 ENCOUNTER — Encounter: Payer: Self-pay | Admitting: Obstetrics & Gynecology

## 2013-01-24 ENCOUNTER — Encounter: Payer: Self-pay | Admitting: Neurology

## 2013-01-25 ENCOUNTER — Telehealth: Payer: Self-pay | Admitting: Neurology

## 2013-01-25 ENCOUNTER — Ambulatory Visit: Payer: Medicaid Other | Admitting: Neurology

## 2013-01-25 NOTE — Telephone Encounter (Signed)
Patient cancelled within hours of appointment

## 2013-01-30 ENCOUNTER — Emergency Department (HOSPITAL_COMMUNITY)
Admission: EM | Admit: 2013-01-30 | Discharge: 2013-01-30 | Disposition: A | Discharge status: Home / Self Care | Payer: Medicaid Other | Attending: Emergency Medicine | Admitting: Emergency Medicine

## 2013-01-30 ENCOUNTER — Encounter (HOSPITAL_COMMUNITY): Payer: Self-pay | Admitting: Emergency Medicine

## 2013-01-30 DIAGNOSIS — Z8542 Personal history of malignant neoplasm of other parts of uterus: Secondary | ICD-10-CM | POA: Insufficient documentation

## 2013-01-30 DIAGNOSIS — M129 Arthropathy, unspecified: Secondary | ICD-10-CM | POA: Insufficient documentation

## 2013-01-30 DIAGNOSIS — F3289 Other specified depressive episodes: Secondary | ICD-10-CM | POA: Insufficient documentation

## 2013-01-30 DIAGNOSIS — L02419 Cutaneous abscess of limb, unspecified: Secondary | ICD-10-CM | POA: Insufficient documentation

## 2013-01-30 DIAGNOSIS — L03115 Cellulitis of right lower limb: Secondary | ICD-10-CM

## 2013-01-30 DIAGNOSIS — G8929 Other chronic pain: Secondary | ICD-10-CM | POA: Insufficient documentation

## 2013-01-30 DIAGNOSIS — F329 Major depressive disorder, single episode, unspecified: Secondary | ICD-10-CM | POA: Insufficient documentation

## 2013-01-30 DIAGNOSIS — J45909 Unspecified asthma, uncomplicated: Secondary | ICD-10-CM | POA: Insufficient documentation

## 2013-01-30 DIAGNOSIS — F411 Generalized anxiety disorder: Secondary | ICD-10-CM | POA: Insufficient documentation

## 2013-01-30 DIAGNOSIS — Z792 Long term (current) use of antibiotics: Secondary | ICD-10-CM | POA: Insufficient documentation

## 2013-01-30 DIAGNOSIS — L02415 Cutaneous abscess of right lower limb: Secondary | ICD-10-CM

## 2013-01-30 DIAGNOSIS — I10 Essential (primary) hypertension: Secondary | ICD-10-CM | POA: Insufficient documentation

## 2013-01-30 DIAGNOSIS — E079 Disorder of thyroid, unspecified: Secondary | ICD-10-CM | POA: Insufficient documentation

## 2013-01-30 DIAGNOSIS — Z79899 Other long term (current) drug therapy: Secondary | ICD-10-CM | POA: Insufficient documentation

## 2013-01-30 MED ORDER — CLINDAMYCIN HCL 300 MG PO CAPS: 300.0000 mg | ORAL_CAPSULE | Freq: Four times a day (QID) | ORAL | Status: DC

## 2013-01-30 MED ORDER — IBUPROFEN 800 MG PO TABS
800.0000 mg | ORAL_TABLET | Freq: Once | ORAL | Status: AC
  Administered 2013-01-30: 800 mg via ORAL
  Filled 2013-01-30: qty 1

## 2013-01-30 MED ORDER — OXYCODONE-ACETAMINOPHEN 5-325 MG PO TABS
2.0000 | ORAL_TABLET | Freq: Once | ORAL | Status: AC
  Administered 2013-01-30: 2 via ORAL
  Filled 2013-01-30: qty 2

## 2013-01-30 MED ORDER — CLINDAMYCIN HCL 300 MG PO CAPS
300.0000 mg | ORAL_CAPSULE | Freq: Once | ORAL | Status: AC
  Administered 2013-01-30: 300 mg via ORAL
  Filled 2013-01-30: qty 1

## 2013-01-30 MED ORDER — LORAZEPAM 1 MG PO TABS
1.0000 mg | ORAL_TABLET | Freq: Once | ORAL | Status: AC
  Administered 2013-01-30: 1 mg via ORAL
  Filled 2013-01-30: qty 1

## 2013-01-30 NOTE — ED Provider Notes (Signed)
CSN: [id number]     Arrival date & time 01/30/13  1715 History   First MD Initiated Contact with Patient 01/30/13 1723     Chief Complaint  Patient presents with  . Abscess    The history is provided by the patient.   patient reports worsening right medial thigh pain over the past 36-48 hours.  She states she feels a large swelling with warmth.  She reports chills without documented fever.  Her pain at this time is 10 out of 10.  Her pain is worsened by movement and palpation of her right medial thigh.  No history of recent trauma to the area.  Initial observation of this area demonstrates a large abscess with surrounding erythema.  Nothing improves her pain.  She does not have a history of diabetes.  Past Medical History  Diagnosis Date  . Thyroid disease   . Arthritis   . Asthma   . Depression   . Fibromyalgia   . Lumbar spondylolysis   . Allergy   . Endometrial cancer   . Spinal stenosis   . Hypertension   . Dysrhythmia     OCCAS PAC'S AND PVC'S  PER HOLTER MONITOR STUDY --PT HAS OCCAS CHEST PAINS AND PALPITATIONS--HAD ECHO NORMAL LV SIZE AND FUNCTION.  Marland Kitchen Sleep apnea     HAS CPAP MACHINE BUT DOES NOT USE  . Anxiety     HAS OCD AND ON DISABILITY-PER [name]   . Back pain, chronic    Past Surgical History  Procedure Laterality Date  . Cesarean section    . Abdominal hysterectomy     Family History  Problem Relation Age of Onset  . Hypertension Mother   . Coronary artery disease Father   . Cancer Brother     Renal cell carcinoma  . Hypertension Other   . Cancer Other   . Heart attack Father   . Coronary artery disease Brother    History  Substance Use Topics  . Smoking status: Never Smoker   . Smokeless tobacco: Never Used  . Alcohol Use: No   OB History   Grav Para Term Preterm Abortions TAB SAB Ect Mult Living   1 1             Review of Systems  All other systems reviewed and are negative.    Allergies  Sulfa antibiotics  Home Medications    Current Outpatient Rx  Name  Route  Sig  Dispense  Refill  . acetaminophen (TYLENOL) 500 MG tablet   Oral   Take 1,000 mg by mouth every 6 (six) hours as needed for pain.         . Ascorbic Acid (VITAMIN C PO)   Oral   Take 1 tablet by mouth daily. Ester C 500         . estradiol (ESTRACE) 0.5 MG tablet   Oral   Take 0.5 mg by mouth every evening.         Marland Kitchen levothyroxine (SYNTHROID, LEVOTHROID) 150 MCG tablet   Oral   Take 150 mcg by mouth daily before breakfast.         . LORazepam (ATIVAN) 1 MG tablet   Oral   Take 1 tablet (1 mg total) by mouth 2 (two) times daily.   30 tablet   0   . sertraline (ZOLOFT) 100 MG tablet   Oral   Take 200 mg by mouth every evening.          Marland Kitchen  thiothixene (NAVANE) 2 MG capsule   Oral   Take 1 capsule (2 mg total) by mouth at bedtime.   10 capsule   0   . Vitamin D, Ergocalciferol, (DRISDOL) 50000 UNITS CAPS capsule   Oral   Take 50,000 Units by mouth every 7 (seven) days.         . clindamycin (CLEOCIN) 300 MG capsule   Oral   Take 1 capsule (300 mg total) by mouth 4 (four) times daily.   28 capsule   0    BP 139/76  Pulse 97  Temp(Src) 99.6 F (37.6 C) (Oral)  Resp 16  SpO2 97% Physical Exam  Nursing note and vitals reviewed. Constitutional: She is oriented to person, place, and time. She appears well-developed and well-nourished.  HENT:  Head: Normocephalic.  Eyes: EOM are normal.  Neck: Normal range of motion.  Pulmonary/Chest: Effort normal.  Abdominal: She exhibits no distension.  Musculoskeletal: Normal range of motion.  Large abscess with surrounding cellulitis of her right medial proximal thigh.  No drainage.  Obvious fluctuance.  Surrounding erythema involving approximately one third of her proximal medial thigh.  Neurological: She is alert and oriented to person, place, and time.  Psychiatric: She has a normal mood and affect.    ED Course  Procedures (including critical care  time)  INCISION AND DRAINAGE Performed by: Lyanne Co Consent: Verbal consent obtained. Risks and benefits: risks, benefits and alternatives were discussed Time out performed prior to procedure Type: abscess Body area: left medial thigh Anesthesia: local infiltration Incision was made with a scalpel. Local anesthetic: lidocaine 2 % without epinephrine Anesthetic total: 12 ml Complexity: complex Blunt dissection to break up loculations Drainage: purulent Drainage amount: Large  Packing material: None  Patient tolerance: Patient tolerated the procedure well with no immediate complications.     Labs Review Labs Reviewed - No data to display Imaging Review No results found.  EKG Interpretation   None       MDM   1. Abscess of right thigh   2. Cellulitis of right thigh    The patient will require incision and drainage of her right medial thigh abscess.  Patient is extremely anxious at this time.  Her pain will be treated and her anxiety will be treated.  7:38 PM Tolerated her seizure well.  Patient will return to the ER in 48 hours for recheck or sooner as needed.  Home on clindamycin.  First dose in the ER.    Lyanne Co, MD 01/30/13 [phone]

## 2013-01-30 NOTE — ED Notes (Signed)
MD at bedside.- draining abscess - pt appears to be tolerating well

## 2013-01-30 NOTE — ED Notes (Signed)
Patient states she is missing her coat. Patient stated that coat is black, light weight, with gold stripes going down the front of the jacket. Patient states only jacket has.

## 2013-01-30 NOTE — ED Notes (Signed)
Bed: OZ30 Expected date:  Expected time:  Means of arrival:  Comments: Abscess to inner thigh

## 2013-01-30 NOTE — ED Notes (Signed)
Pt has abscess to right inner thigh x 1 week, 10/10 pain.

## 2013-01-30 NOTE — ED Notes (Signed)
Pt ambulated to the restroom independently, gait was steady with some shuffling noticeable.

## 2013-02-28 ENCOUNTER — Encounter (INDEPENDENT_AMBULATORY_CARE_PROVIDER_SITE_OTHER): Payer: Self-pay

## 2013-02-28 ENCOUNTER — Ambulatory Visit (INDEPENDENT_AMBULATORY_CARE_PROVIDER_SITE_OTHER): Payer: Medicaid Other | Admitting: Neurology

## 2013-02-28 ENCOUNTER — Encounter: Payer: Self-pay | Admitting: Neurology

## 2013-02-28 VITALS — BP 135/89 | HR 88 | Ht <= 58 in | Wt 182.0 lb

## 2013-02-28 DIAGNOSIS — R51 Headache: Secondary | ICD-10-CM

## 2013-02-28 MED ORDER — PROPRANOLOL HCL 10 MG PO TABS: ORAL_TABLET | ORAL | Status: DC

## 2013-02-28 NOTE — Progress Notes (Signed)
Reason for visit: Headache  [name] is a 57 y.o. female  History of present illness:  [name] is a 57 year old white female with a history of obesity, depression , and headache. The patient indicates that she began having headaches approximately one year ago. The patient indicates that the headaches are daily in nature, better in the morning, worse as the day goes on. The patient indicates that she is unable to describe the characteristics of the headache. The patient has not had any photophobia, phonophobia, nausea, or vomiting with the headache. The patient may have some blurring of vision. The patient also indicates that she has a tremor involving both upper extremities that will come and go, and bother her when she is trying to do something with her hand. The patient has a history of hypothyroidism, on Synthroid. The patient has undergone a recent CT scan of the brain in September 2014 that showed evidence of small vessel disease. This study was reviewed on line. The patient denies any problems with balance, or problems controlling the bowels or the bladder. The patient denies any focal numbness or weakness of the face, arms, or legs. The patient is sent to this office for further evaluation.   Past Medical History  Diagnosis Date  . Thyroid disease   . Arthritis   . Asthma   . Depression   . Fibromyalgia   . Lumbar spondylolysis   . Allergy   . Endometrial cancer   . Spinal stenosis   . Hypertension   . Dysrhythmia     OCCAS PAC'S AND PVC'S  PER HOLTER MONITOR STUDY --PT HAS OCCAS CHEST PAINS AND PALPITATIONS--HAD ECHO NORMAL LV SIZE AND FUNCTION.  Marland Kitchen Sleep apnea     HAS CPAP MACHINE BUT DOES NOT USE  . Anxiety     HAS OCD AND ON DISABILITY-PER [name]   . Back pain, chronic   . Obese     Past Surgical History  Procedure Laterality Date  . Cesarean section    . Abdominal hysterectomy      total    Family History  Problem Relation Age of Onset  .  Hypertension Mother   . Coronary artery disease Father   . Cancer Brother     Renal cell carcinoma  . Hypertension Other   . Cancer Other   . Heart attack Father   . Coronary artery disease Brother     Social history:  reports that she has never smoked. She has never used smokeless tobacco. She reports that she does not drink alcohol or use illicit drugs.  Medications:  Current Outpatient Prescriptions on File Prior to Visit  Medication Sig Dispense Refill  . acetaminophen (TYLENOL) 500 MG tablet Take 1,000 mg by mouth every 6 (six) hours as needed for pain.      . Ascorbic Acid (VITAMIN C PO) Take 1 tablet by mouth daily. Ester C 500      . estradiol (ESTRACE) 0.5 MG tablet Take 0.5 mg by mouth every evening.      Marland Kitchen levothyroxine (SYNTHROID, LEVOTHROID) 150 MCG tablet Take 150 mcg by mouth daily before breakfast.      . LORazepam (ATIVAN) 1 MG tablet Take 1 tablet (1 mg total) by mouth 2 (two) times daily.  30 tablet  0  . sertraline (ZOLOFT) 100 MG tablet Take 200 mg by mouth every evening.       . thiothixene (NAVANE) 2 MG capsule Take 1 capsule (2 mg total)  by mouth at bedtime.  10 capsule  0  . Vitamin D, Ergocalciferol, (DRISDOL) 50000 UNITS CAPS capsule Take 50,000 Units by mouth every 7 (seven) days.       No current facility-administered medications on file prior to visit.      Allergies  Allergen Reactions  . Sulfa Antibiotics Other (See Comments)    Unknown-pt states she is unsure of what her reaction is    ROS:  Out of a complete 14 system review of symptoms, the patient complains only of the following symptoms, and all other reviewed systems are negative.  Fatigue  Chest pain, palpitations Blurred vision Snoring Blood in the stool, diarrhea, constipation Easy bruising Feeling hot, flushing Jaw pain, achy muscles Frequent infections Headache, weakness, dizziness, tremor Depression, anxiety, insomnia  Blood pressure 135/89, pulse 88, height 4' 9.5" (1.461  m), weight 182 lb (82.555 kg).  Physical Exam  General: The patient is alert and cooperative at the time of the examination.The patient is moderately to markedly obese.   Head: Pupils are equal, round, and reactive to light. Discs are flat bilaterally.  Neck: The neck is supple, no carotid bruits are noted.  Respiratory: The respiratory examination is clear.  Cardiovascular: The cardiovascular examination reveals a regular rate and rhythm, no obvious murmurs or rubs are noted.  Neuromuscular: The patient lacks about 30 of lateral rotation of the cervical spine bilaterally. No crepitus is noted in the temporomandibular joints.  Skin: Extremities are without significant edema.  Neurologic Exam  Mental status: the patient is alert and oriented x 3 the time of the examination.   Cranial nerves: Facial symmetry is present. There is good sensation of the face to pinprick and soft touch bilaterally. The strength of the facial muscles and the muscles to head turning and shoulder shrug are normal bilaterally. Speech is well enunciated, no aphasia or dysarthria is noted. Extraocular movements are full. Visual fields are full.  Motor: The motor testing reveals 5 over 5 strength of all 4 extremities. Good symmetric motor tone is noted throughout.  Sensory: Sensory testing is intact to pinprick, soft touch, vibration sensation, and position sense on all 4 extremities. No evidence of extinction is noted.  Coordination: Cerebellar testing reveals good finger-nose-finger and heel-to-shin bilaterally.  Gait and station: Gait is normal. Tandem gait is minimally unsteady.  Romberg is negative. No drift is seen.  Reflexes: Deep tendon reflexes are symmetric and normal bilaterally. Toes are downgoing bilaterally.   Assessment/Plan:  One. Headache  2. Depression  3. 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 I have prescribed propranolol,  but the patient does not wish to take this. The patient will be set up for MRI evaluation of the brain, as the head CT scan shows small vessel changes that are out of proportion to what one would expect in a patient of her age. The patient will followup in 4 or 5 months. 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   Marlan Palau MD 02/28/2013 8:34 PM  [facility] [address] [place], [place] [address]  Phone [phone] Fax [phone]

## 2013-02-28 NOTE — Patient Instructions (Signed)

## 2013-03-15 ENCOUNTER — Inpatient Hospital Stay: Admission: RE | Admit: 2013-03-15 | Payer: Medicaid Other | Source: Ambulatory Visit

## 2013-03-24 ENCOUNTER — Ambulatory Visit
Admission: RE | Admit: 2013-03-24 | Discharge: 2013-03-24 | Disposition: A | Discharge status: Home / Self Care | Payer: Medicaid Other | Source: Ambulatory Visit | Attending: Neurology | Admitting: Neurology

## 2013-03-24 DIAGNOSIS — R51 Headache: Secondary | ICD-10-CM

## 2013-03-27 ENCOUNTER — Telehealth: Payer: Self-pay | Admitting: Neurology

## 2013-03-27 MED ORDER — TOPIRAMATE 25 MG PO TABS: ORAL_TABLET | ORAL | Status: DC

## 2013-03-27 NOTE — Telephone Encounter (Signed)
Wants to speak with Dr about her results and what's going on with her since her scan was normal. Please call

## 2013-03-27 NOTE — Telephone Encounter (Signed)
I Called patient. I talked with her mother. MRI the brain was unremarkable, no change from 18 months ago. Very minimal white matter changes, hematocrit white matter changes could be fully consistent with the diagnosis of migraine.

## 2013-03-27 NOTE — Telephone Encounter (Signed)
I called patient. The patient had a normal MRI of the brain, or relatively normal with minimal white matter changes that are fully consistent with the diagnosis of migraine. I will start Topamax on this patient, as this may help tremors, migraine, and help induce weight loss.

## 2013-04-12 ENCOUNTER — Telehealth: Payer: Self-pay | Admitting: Gynecologic Oncology

## 2013-04-12 NOTE — Telephone Encounter (Signed)
Patient called stating "I am not doing good."  "They want to put me asleep for a colonoscopy and want to use propofol but I have severe sleep apnea and am not sure that I have ever had that before."  She wanted to know if she received propofol during her surgery (hysterectomy) in August 2013.  Informed that she was given propofol 150 mg during that operation per anesthesia records.  Verbalizing understanding.  Instructed to call for any needs.  No concerns voiced.

## 2013-07-04 ENCOUNTER — Ambulatory Visit: Payer: Medicaid Other | Admitting: Nurse Practitioner

## 2013-07-25 ENCOUNTER — Other Ambulatory Visit: Payer: Self-pay | Admitting: *Deleted

## 2013-07-25 DIAGNOSIS — N951 Menopausal and female climacteric states: Secondary | ICD-10-CM

## 2013-07-25 MED ORDER — ESTRADIOL 0.5 MG PO TABS: 0.5000 mg | ORAL_TABLET | Freq: Every evening | ORAL | Status: DC

## 2013-07-27 ENCOUNTER — Encounter: Payer: Self-pay | Admitting: Obstetrics & Gynecology

## 2013-11-01 ENCOUNTER — Ambulatory Visit: Payer: Medicaid Other | Admitting: Obstetrics & Gynecology

## 2013-11-06 ENCOUNTER — Other Ambulatory Visit: Payer: Self-pay | Admitting: *Deleted

## 2013-11-06 ENCOUNTER — Telehealth: Payer: Self-pay | Admitting: *Deleted

## 2013-11-06 DIAGNOSIS — N951 Menopausal and female climacteric states: Secondary | ICD-10-CM

## 2013-11-06 MED ORDER — ESTRADIOL 0.5 MG PO TABS: 0.5000 mg | ORAL_TABLET | Freq: Every evening | ORAL | Status: DC

## 2013-11-06 NOTE — Telephone Encounter (Signed)
Patient states she had an Annual Exam for this week but was rescheduled by the front for 01/02/14. Patient states she needs a refill on her Estrodiol 0.5 MG.   Patient advised we would refill her Rx until her annual exam and then Dr. Delsa Sale would reorder it at that time. Patient voiced understanding. Refills sent to patients pharmacy.

## 2013-11-21 ENCOUNTER — Encounter (HOSPITAL_COMMUNITY): Payer: Self-pay | Admitting: Emergency Medicine

## 2013-11-21 ENCOUNTER — Emergency Department (HOSPITAL_COMMUNITY)
Admission: EM | Admit: 2013-11-21 | Discharge: 2013-11-22 | Disposition: A | Discharge status: Home / Self Care | Payer: Medicaid Other | Attending: Emergency Medicine | Admitting: Emergency Medicine

## 2013-11-21 DIAGNOSIS — G8929 Other chronic pain: Secondary | ICD-10-CM | POA: Insufficient documentation

## 2013-11-21 DIAGNOSIS — R319 Hematuria, unspecified: Secondary | ICD-10-CM | POA: Diagnosis present

## 2013-11-21 DIAGNOSIS — J45909 Unspecified asthma, uncomplicated: Secondary | ICD-10-CM | POA: Insufficient documentation

## 2013-11-21 DIAGNOSIS — Z8739 Personal history of other diseases of the musculoskeletal system and connective tissue: Secondary | ICD-10-CM | POA: Diagnosis not present

## 2013-11-21 DIAGNOSIS — F3289 Other specified depressive episodes: Secondary | ICD-10-CM | POA: Insufficient documentation

## 2013-11-21 DIAGNOSIS — I10 Essential (primary) hypertension: Secondary | ICD-10-CM | POA: Diagnosis not present

## 2013-11-21 DIAGNOSIS — F411 Generalized anxiety disorder: Secondary | ICD-10-CM | POA: Insufficient documentation

## 2013-11-21 DIAGNOSIS — Z79899 Other long term (current) drug therapy: Secondary | ICD-10-CM | POA: Insufficient documentation

## 2013-11-21 DIAGNOSIS — IMO0001 Reserved for inherently not codable concepts without codable children: Secondary | ICD-10-CM | POA: Insufficient documentation

## 2013-11-21 DIAGNOSIS — E669 Obesity, unspecified: Secondary | ICD-10-CM | POA: Insufficient documentation

## 2013-11-21 DIAGNOSIS — N898 Other specified noninflammatory disorders of vagina: Secondary | ICD-10-CM | POA: Diagnosis not present

## 2013-11-21 DIAGNOSIS — N939 Abnormal uterine and vaginal bleeding, unspecified: Secondary | ICD-10-CM

## 2013-11-21 DIAGNOSIS — F329 Major depressive disorder, single episode, unspecified: Secondary | ICD-10-CM | POA: Insufficient documentation

## 2013-11-21 DIAGNOSIS — G4730 Sleep apnea, unspecified: Secondary | ICD-10-CM | POA: Insufficient documentation

## 2013-11-21 DIAGNOSIS — Z8544 Personal history of malignant neoplasm of other female genital organs: Secondary | ICD-10-CM | POA: Diagnosis not present

## 2013-11-21 DIAGNOSIS — Z90710 Acquired absence of both cervix and uterus: Secondary | ICD-10-CM | POA: Insufficient documentation

## 2013-11-21 DIAGNOSIS — E079 Disorder of thyroid, unspecified: Secondary | ICD-10-CM | POA: Diagnosis not present

## 2013-11-21 LAB — COMPREHENSIVE METABOLIC PANEL
ALBUMIN: 3.9 g/dL (ref 3.5–5.2)
ALK PHOS: 49 U/L (ref 39–117)
ALT: 24 U/L (ref 0–35)
AST: 41 U/L — ABNORMAL HIGH (ref 0–37)
Anion gap: 14 (ref 5–15)
BUN: 9 mg/dL (ref 6–23)
CO2: 27 mEq/L (ref 19–32)
Calcium: 10 mg/dL (ref 8.4–10.5)
Chloride: 102 mEq/L (ref 96–112)
Creatinine, Ser: 0.71 mg/dL (ref 0.50–1.10)
GFR calc Af Amer: >90 mL/min (ref >90)
GFR calc non Af Amer: >90 mL/min (ref >90)
Glucose, Bld: 93 mg/dL (ref 70–99)
POTASSIUM: 4.9 meq/L (ref 3.7–5.3)
SODIUM: 143 meq/L (ref 137–147)
Total Bilirubin: 0.3 mg/dL (ref 0.3–1.2)
Total Protein: 7.8 g/dL (ref 6.0–8.3)

## 2013-11-21 LAB — URINALYSIS, ROUTINE W REFLEX MICROSCOPIC
BILIRUBIN URINE: NEGATIVE
Glucose, UA: NEGATIVE mg/dL
KETONES UR: NEGATIVE mg/dL
NITRITE: NEGATIVE
PROTEIN: NEGATIVE mg/dL
Specific Gravity, Urine: 1.02 (ref 1.005–1.030)
Urobilinogen, UA: 0.2 mg/dL (ref 0.0–1.0)
pH: 5 (ref 5.0–8.0)

## 2013-11-21 LAB — CBC
HCT: 43.1 % (ref 36.0–46.0)
Hemoglobin: 14.9 g/dL (ref 12.0–15.0)
MCH: 30.4 pg (ref 26.0–34.0)
MCHC: 34.6 g/dL (ref 30.0–36.0)
MCV: 88 fL (ref 78.0–100.0)
Platelets: 221 10*3/uL (ref 150–400)
RBC: 4.9 MIL/uL (ref 3.87–5.11)
RDW: 13.1 % (ref 11.5–15.5)
WBC: 8.2 10*3/uL (ref 4.0–10.5)

## 2013-11-21 LAB — URINE MICROSCOPIC-ADD ON

## 2013-11-21 NOTE — ED Notes (Signed)
Pt reports blood in urine for 2 months with urinary leakage. Pt reports lower abdominal cramping. Pt with hx endometrial cancer. Pt also reports fatigue.

## 2013-11-22 ENCOUNTER — Telehealth: Payer: Self-pay | Admitting: Gynecologic Oncology

## 2013-11-22 LAB — WET PREP, GENITAL
Trich, Wet Prep: NONE SEEN
YEAST WET PREP: NONE SEEN

## 2013-11-22 LAB — HIV ANTIBODY (ROUTINE TESTING W REFLEX): HIV 1&2 Ab, 4th Generation: NONREACTIVE

## 2013-11-22 MED ORDER — OXYCODONE-ACETAMINOPHEN 5-325 MG PO TABS: 1.0000 | ORAL_TABLET | Freq: Two times a day (BID) | ORAL | Status: DC | PRN

## 2013-11-22 MED ORDER — HYDROCODONE-ACETAMINOPHEN 5-325 MG PO TABS
2.0000 | ORAL_TABLET | Freq: Once | ORAL | Status: DC
  Filled 2013-11-22: qty 2

## 2013-11-22 MED ORDER — OXYCODONE-ACETAMINOPHEN 5-325 MG PO TABS
1.0000 | ORAL_TABLET | Freq: Once | ORAL | Status: AC
  Administered 2013-11-22: 1 via ORAL
  Filled 2013-11-22: qty 1

## 2013-11-22 NOTE — Discharge Instructions (Signed)
Abnormal Uterine Bleeding [name], you were seen today for vaginal bleeding.  Your pelvic exam reveal a possible mass in your vaginal wall.  Please follow up with your gynecologist to further evaluation.  Your cancer may have reoccured.  Continue to take tylenol at home for pain as needed.  Return to the ED immediately for any worsening pain or bleeding.  Thank you. Abnormal uterine bleeding means bleeding from the vagina that is not your normal menstrual period. This can be:  Bleeding or spotting between periods.  Bleeding after sex (sexual intercourse).  Bleeding that is heavier or more than normal.  Periods that last longer than usual.  Bleeding after menopause. There are many problems that may cause this. Treatment will depend on the cause of the bleeding. Any kind of bleeding that is not normal should be reviewed by your doctor.  HOME CARE Watch your condition for any changes. These actions may lessen any discomfort you are having:  Do not use tampons or douches as told by your doctor.  Change your pads often. You should get regular pelvic exams and Pap tests. Keep all appointments for tests as told by your doctor. GET HELP IF:  You are bleeding for more than 1 week.  You feel dizzy at times. GET HELP RIGHT AWAY IF:   You pass out.  You have to change pads every 15 to 30 minutes.  You have belly pain.  You have a fever.  You become sweaty or weak.  You are passing large blood clots from the vagina.  You feel sick to your stomach (nauseous) and throw up (vomit). MAKE SURE YOU:  Understand these instructions.  Will watch your condition.  Will get help right away if you are not doing well or get worse. Document Released: 01/10/2009 Document Revised: 03/20/2013 Document Reviewed: 10/12/2012 [facility] Patient Information 2015 [place], [place]. This information is not intended to replace advice given to you by your health care provider. Make sure you discuss any  questions you have with your health care provider.

## 2013-11-22 NOTE — ED Notes (Signed)
NAD at this time.  

## 2013-11-22 NOTE — Telephone Encounter (Signed)
I received a TC from the ER physician who saw her last night. I have not seen the patient since her surgery. She called and cancelled appointments, was encouraged to reschedule and did not. There is a concern for a recurrence of her cancer. I spoke with her today.  She states that she has an appointment with Dr. Delsa Sale tomorrow at 10:45. I will also ensure that she has the next available appointment to see me at [facility]. I will ask our office nurses to call her today for an appointment to return to see me. I will also alert Dr. Glennon Mac Laurance Flatten to the need to consider a biopsy when she is seen tomorrow.   I spoke with the patient about the need for follow up and if this is an early recurrence it can be very curable, however, with lack of care, it can become a serious issue. She voiced understanding and states that she will keep her upcoming appointments.  Nancy Marus

## 2013-11-22 NOTE — ED Provider Notes (Signed)
CSN: [id number]     Arrival date & time 11/21/13  2033 History   First MD Initiated Contact with Patient 11/22/13 0026     Chief Complaint  Patient presents with  . Hematuria     (Consider location/radiation/quality/duration/timing/severity/associated sxs/prior Treatment) HPI  [name] 58 year old female with a past medical history of endometrial cancer coming in with hematuria for the past 3 months. Patient states she at times she has been incontinent, and when she's walking around she is felt urine and blood comes out. She's had to wear a diaper. Patient states that hematuria and bilateral lower quadrant abdominal pain is getting worse and is now constant every day. Patient has a history of the total hysterectomy including removal of the ovaries. She is concerned that her cancer has recurred. Patient denies any fevers chills recent infections.    Past Medical History  Diagnosis Date  . Thyroid disease   . Arthritis   . Asthma   . Depression   . Fibromyalgia   . Lumbar spondylolysis   . Allergy   . Endometrial cancer   . Spinal stenosis   . Hypertension   . Dysrhythmia     OCCAS PAC'S AND PVC'S  PER HOLTER MONITOR STUDY --PT HAS OCCAS CHEST PAINS AND PALPITATIONS--HAD ECHO NORMAL LV SIZE AND FUNCTION.  Marland Kitchen Sleep apnea     HAS CPAP MACHINE BUT DOES NOT USE  . Anxiety     HAS OCD AND ON DISABILITY-PER [name]   . Back pain, chronic   . Obese    Past Surgical History  Procedure Laterality Date  . Cesarean section    . Abdominal hysterectomy      total   Family History  Problem Relation Age of Onset  . Hypertension Mother   . Coronary artery disease Father   . Cancer Brother     Renal cell carcinoma  . Hypertension Other   . Cancer Other   . Heart attack Father   . Coronary artery disease Brother    History  Substance Use Topics  . Smoking status: Never Smoker   . Smokeless tobacco: Never Used  . Alcohol Use: No   OB History   Grav Para Term Preterm  Abortions TAB SAB Ect Mult Living   1 1             Review of Systems 10 Systems reviewed and are negative for acute change except as noted in the HPI.    Allergies  Sulfa antibiotics  Home Medications   Prior to Admission medications   Medication Sig Start Date End Date Taking? Authorizing Provider  Ascorbic Acid (VITAMIN C PO) Take 1 tablet by mouth daily. Ester C 500   Yes Historical Provider, MD  estradiol (ESTRACE) 0.5 MG tablet Take 1 tablet (0.5 mg total) by mouth every evening. 11/06/13  Yes Lahoma Crocker, MD  levothyroxine (SYNTHROID, LEVOTHROID) 125 MCG tablet Take 125 mcg by mouth daily before breakfast.   Yes Historical Provider, MD  LORazepam (ATIVAN) 1 MG tablet Take 1 tablet (1 mg total) by mouth 2 (two) times daily. 02/23/12  Yes Shari A Upstill, PA-C  sertraline (ZOLOFT) 100 MG tablet Take 200 mg by mouth every evening.  10/15/11  Yes Srikar Janna Arch, MD  thiothixene (NAVANE) 2 MG capsule Take 1 capsule (2 mg total) by mouth at bedtime. 03/07/12  Yes Carlisle Cater, PA-C  Vitamin D, Ergocalciferol, (DRISDOL) 50000 UNITS CAPS capsule Take 50,000 Units by mouth every 7 (seven) days. No  specific days   Yes Historical Provider, MD   BP 134/111  Pulse 90  Temp(Src) 98.5 F (36.9 C) (Oral)  Resp 18  SpO2 95% Physical Exam  Nursing note and vitals reviewed. Constitutional: She is oriented to person, place, and time. She appears well-developed and well-nourished. No distress.  HENT:  Head: Normocephalic and atraumatic.  Nose: Nose normal.  Mouth/Throat: Oropharynx is clear and moist. No oropharyngeal exudate.  Eyes: Conjunctivae and EOM are normal. Pupils are equal, round, and reactive to light. No scleral icterus.  Neck: Normal range of motion. Neck supple. No JVD present. No tracheal deviation present. No thyromegaly present.  Cardiovascular: Normal rate, regular rhythm and normal heart sounds.  Exam reveals no gallop and no friction rub.   No murmur  heard. Pulmonary/Chest: Effort normal and breath sounds normal. No respiratory distress. She has no wheezes. She exhibits no tenderness.  Abdominal: Soft. Bowel sounds are normal. She exhibits no distension and no mass. There is no tenderness. There is no rebound and no guarding.  Genitourinary:  Fungating mass seen at the 11:00 position with mild blood.  Musculoskeletal: Normal range of motion. She exhibits no edema and no tenderness.  Lymphadenopathy:    She has no cervical adenopathy.  Neurological: She is alert and oriented to person, place, and time.  Skin: Skin is warm and dry. No rash noted. She is not diaphoretic. No erythema. No pallor.    ED Course  Procedures (including critical care time) Labs Review Labs Reviewed  URINALYSIS, ROUTINE W REFLEX MICROSCOPIC - Abnormal; Notable for the following:    APPearance CLOUDY (*)    Hgb urine dipstick SMALL (*)    Leukocytes, UA SMALL (*)    All other components within normal limits  COMPREHENSIVE METABOLIC PANEL - Abnormal; Notable for the following:    AST 41 (*)    All other components within normal limits  URINE MICROSCOPIC-ADD ON - Abnormal; Notable for the following:    Squamous Epithelial / LPF FEW (*)    All other components within normal limits  GC/CHLAMYDIA PROBE AMP  WET PREP, GENITAL  CBC  HIV ANTIBODY (ROUTINE TESTING)    Imaging Review No results found.   EKG Interpretation None      MDM   Final diagnoses:  None    Patient presents today with hematuria and concern for recurrence of her cancer. Urinalysis reveals only a small amount of hemoglobin in the urine. My pelvic exam reveals a fungating mass with trace of blood around the. I have consented the patient does have recurrence of her cancer. She is strongly advised to followup with her oncologist or her gynecologist within the next week for further diagnosis and possible treatment. Patient remains hemodynamically stable, in no acute distress, and her  vital signs remained stable. Patient is not tachycardic when I'm in the room most recent heart rate is 83. Patient safe for discharge.  Everlene Balls, MD 11/22/13 [id number]

## 2013-11-23 ENCOUNTER — Ambulatory Visit: Payer: Medicaid Other | Admitting: Obstetrics & Gynecology

## 2013-11-23 LAB — GC/CHLAMYDIA PROBE AMP
CT Probe RNA: NEGATIVE
GC Probe RNA: NEGATIVE

## 2013-11-25 ENCOUNTER — Telehealth (HOSPITAL_BASED_OUTPATIENT_CLINIC_OR_DEPARTMENT_OTHER): Payer: Self-pay

## 2013-11-25 NOTE — Telephone Encounter (Signed)
Pt calling to see if biopsy of mass found during pelvic was done.  Informed pt it was not and she needs to f/u w/gyn. and onc. as instructed.

## 2013-11-26 ENCOUNTER — Telehealth: Payer: Self-pay | Admitting: Gynecologic Oncology

## 2013-11-26 DIAGNOSIS — C541 Malignant neoplasm of endometrium: Secondary | ICD-10-CM

## 2013-11-26 NOTE — Telephone Encounter (Signed)
Patient called with several questions, which were answered based on current information.  "Is it cancer?"  Advised to keep her appointment with Dr. Delsa Sale on Wednesday of this week and that she would be scheduled for a CT scan with follow up with Dr. [name] on Sept 16.  Advised to call the office for any questions or concerns.

## 2013-11-28 ENCOUNTER — Ambulatory Visit: Payer: Medicaid Other | Admitting: Obstetrics & Gynecology

## 2013-11-28 ENCOUNTER — Telehealth: Payer: Self-pay | Admitting: Gynecologic Oncology

## 2013-11-28 NOTE — Telephone Encounter (Signed)
Called to check with patient about upcoming appointment with Dr. Delsa Sale this afternoon.  Patient's mother answered the phone and stated she was asleep.  Her mother stated that her appt this afternoon was cancelled and per EPIC the patient cancelled.  Advised of the importance of the appointment and that she would need to pick up contrast for her upcoming CT on 12/06/13.  Mother stating that she would have patient call the office when she woke up.

## 2013-11-30 ENCOUNTER — Telehealth: Payer: Self-pay | Admitting: Gynecologic Oncology

## 2013-11-30 NOTE — Telephone Encounter (Signed)
Called to discuss upcoming appts with pts.  Patient very anxious.  Wanting to see Dr. [name] first before having her CT scan.  Stating she cannot come and pick up her contrast before her scan because she does not have air conditioning in her car.  CT scan will be moved per pt request to after her appt with Dr. [name].  Advised to call the office for any questions or concerns.

## 2013-12-06 ENCOUNTER — Ambulatory Visit (HOSPITAL_COMMUNITY): Payer: Medicaid Other

## 2013-12-11 ENCOUNTER — Telehealth: Payer: Self-pay | Admitting: Gynecologic Oncology

## 2013-12-11 NOTE — Telephone Encounter (Signed)
Patient called stating she is not doing good.  "I am not going to the pain clinic, [facility], anymore.  I was supposed to talk with a substance abuse counselor and have not done that in the past three months.  I waited up there yesterday and then they told me they would not prescribe my medication anymore and I let them have it.  I told them I was not coming back."  Continuing to report moderate lower abdominal pain.  Anxious about appointment tomorrow and having to come back on Thursday for her CT scan.  Wanting to have her CT scan on the same day as her appointment but stating she cannot get here early since she wakes up around noon.  CT scan moved to Wed. afternoon at 4:00pm.  Patient informed that she would need to drink her contrast during and after her appointment and that she is not to have anything to eat or drink after noon.  Verbalizing understanding.  Advised to call for any questions or concerns.

## 2013-12-12 ENCOUNTER — Encounter: Payer: Self-pay | Admitting: Gynecologic Oncology

## 2013-12-12 ENCOUNTER — Encounter (HOSPITAL_COMMUNITY): Payer: Self-pay

## 2013-12-12 ENCOUNTER — Ambulatory Visit: Payer: Medicaid Other | Attending: Gynecologic Oncology | Admitting: Gynecologic Oncology

## 2013-12-12 ENCOUNTER — Ambulatory Visit (HOSPITAL_COMMUNITY)
Admission: RE | Admit: 2013-12-12 | Discharge: 2013-12-12 | Disposition: A | Discharge status: Home / Self Care | Payer: Medicaid Other | Source: Ambulatory Visit | Attending: Gynecologic Oncology | Admitting: Gynecologic Oncology

## 2013-12-12 VITALS — BP 128/69 | HR 88 | Temp 98.6°F | Resp 18 | Ht <= 58 in | Wt 178.0 lb

## 2013-12-12 DIAGNOSIS — Z8542 Personal history of malignant neoplasm of other parts of uterus: Secondary | ICD-10-CM | POA: Insufficient documentation

## 2013-12-12 DIAGNOSIS — C549 Malignant neoplasm of corpus uteri, unspecified: Secondary | ICD-10-CM | POA: Insufficient documentation

## 2013-12-12 DIAGNOSIS — Z90710 Acquired absence of both cervix and uterus: Secondary | ICD-10-CM | POA: Insufficient documentation

## 2013-12-12 DIAGNOSIS — K7689 Other specified diseases of liver: Secondary | ICD-10-CM | POA: Diagnosis not present

## 2013-12-12 DIAGNOSIS — C775 Secondary and unspecified malignant neoplasm of intrapelvic lymph nodes: Secondary | ICD-10-CM | POA: Diagnosis not present

## 2013-12-12 DIAGNOSIS — C541 Malignant neoplasm of endometrium: Secondary | ICD-10-CM

## 2013-12-12 DIAGNOSIS — Z9079 Acquired absence of other genital organ(s): Secondary | ICD-10-CM | POA: Diagnosis not present

## 2013-12-12 MED ORDER — IOHEXOL 300 MG/ML  SOLN
100.0000 mL | Freq: Once | INTRAMUSCULAR | Status: AC | PRN
  Administered 2013-12-12: 100 mL via INTRAVENOUS

## 2013-12-12 NOTE — Progress Notes (Signed)
Consult Note: Gyn-Onc  [name] 58 y.o. female  CC:  Chief Complaint  Patient presents with  . Endometrial Cancer    Follow up    HPI: Patient is a 58 year old gravida 1 para 1 who I saw in 2013. At that time she had more than a year of bleeding. She was seen by Dr. Delsa Sale and had an endometrial biopsy revealed a grade 1 endometrial carcinoma. I initially saw her in April of 2013 but she delayed care and subsequently underwent hysterectomy in August of 2013. 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 - NO EVIDENCE OF ANGIOLYMPHATIC INVASION OR CERVICAL STROMAL INVOLVEMENT IDENTIFIED. - CERVIX: BENIGN SQUAMOUS MUCOSA AND ENDOCERVICAL MUCOSA. NO DYSPLASIA OR MALIGNANCY. - BILATERAL OVARIES AND FALLOPIAN TUBES: NO PATHOLOGIC ABNORMALITIES. Microscopic Comment UTERUS Specimen: Uterus, bilateral ovaries and fallopian tubes Procedure: Total hysterectomy and bilateral salpingo-oophorectomy Lymph node sampling performed: No Specimen integrity: Intact Maximum tumor size (cm): 4.0 cm Histologic type: Invasive endometrioid carcinoma arising in an endometrial polyp Grade: FIGO grade II Myometrial invasion: 0.3 cm where myometrium is 2.4 cm in thickness Cervical stromal involvement: No Extent of involvement of other organs: No Lymph vascular invasion: Not identified Peritoneal washings: N/A Lymph nodes: number examined N/A ; number positive N/A TNM code: pT1a, pNX FIGO Stage (based on pathologic findings, needs clinical correlation): IA  I have not seen her since that time despite the recommendation for followup of multiple cold strides get her in. She did not require any adjuvant therapy as she had a minimally invasive disease with only a grade 2 lesion but still required  close followup. She states that she was not aware of the need for followup. I received a call from the emergency room on August 27. Emergency room physician stated that he is worse concern for recurrence of her endometrial cancer. The patient had a followup appointment the subsequent day on August 28 for Dr. Delsa Sale she was aware of. The patient did not keep that appointment. We had requested that she keep that appointment haven't biopsy performed as well as a CT scan prior to today's visit which she did not do either. She comes to clinic today.  She is very tearful today stating that she went to her pain clinic and they would not prescribe any pain medications. It seems that there is something in her pain medicine contract regarding seeing a substance abuse counselor that she did not adhere to and she "told them off" when they would not provide her with her prescription for her pain medication. She is now requesting that I provide those pain medications for her which I will not do. She states that she was tired from her hysterectomy for a long time. She took estrogen for her hot flashes she is feeling better now. She stay she wears pads her stress urinary incontinence and for the past month and a half she's had what she describes as urine "coming out all of the time". She is wearing a pad for this and has noticee some pink discharge. She's also states the pink discharge started before the drainage from the vagina and she did not seek attention for this and she cannot state why she did not do that. She states that she is yellow vaginal discharge and a foul odor. She is complaining of constipation but she's not using  her stool softeners. She stay she's complaining of pelvic pain that radiates to her back. She has pain going down her right leg in her right hip.  Review of Systems  Constitutional: Feels tired with no energy during the day.  + pain Denies fever. Gastro Intestinal: Reporting intermittent  lower abdominal soreness. Pain through her pelvis and radiating to her back. No nausea, vomiting, constipation, or diarrhea reported. No bright red blood per rectum or change in bowel movement.  Genitourinary: + frequency, urgency, or dysuria.  +vaginal bleeding and discharge.  Musculoskeletal: No myalgia, arthralgia, joint swelling or pain.  Neurologic: + weakness, numbness, or change in gait.  Psychology: crying   Current Meds:  Outpatient Encounter Prescriptions as of 12/12/2013  Medication Sig  . albuterol (VENTOLIN HFA) 108 (90 BASE) MCG/ACT inhaler Inhale 2 puffs into the lungs.  . buprenorphine-naloxone (SUBOXONE) 2-0.5 MG SUBL SL tablet Place 1 tablet under the tongue 2 (two) times daily. Per pt  . estradiol (ESTRACE) 0.5 MG tablet Take 1 tablet (0.5 mg total) by mouth every evening.  Marland Kitchen levothyroxine (SYNTHROID, LEVOTHROID) 125 MCG tablet Take 125 mcg by mouth daily before breakfast.  . LORazepam (ATIVAN) 1 MG tablet Take 1 tablet (1 mg total) by mouth 2 (two) times daily.  . sertraline (ZOLOFT) 100 MG tablet Take 200 mg by mouth every evening.   . thiothixene (NAVANE) 2 MG capsule Take 1 capsule (2 mg total) by mouth at bedtime.  . Ascorbic Acid (VITAMIN C PO) Take 1 tablet by mouth daily. Ester C 500  . oxyCODONE-acetaminophen (PERCOCET/ROXICET) 5-325 MG per tablet Take 1 tablet by mouth 2 (two) times daily as needed for severe pain.  . Vitamin D, Ergocalciferol, (DRISDOL) 50000 UNITS CAPS capsule Take 50,000 Units by mouth every 7 (seven) days. No specific days    Allergy:  Allergies  Allergen Reactions  . Sulfa Antibiotics Other (See Comments)    Unknown-pt states she is unsure of what her reaction is  . Sulfamethoxazole-Trimethoprim Other (See Comments)    Unknown reaction    Social Hx:   History   Social History  . Marital Status: Single    Spouse Name: N/A    Number of Children: 1  . Years of Education: 12th   Occupational History  . disabled    Social History  Main Topics  . Smoking status: Never Smoker   . Smokeless tobacco: Never Used  . Alcohol Use: No  . Drug Use: No  . Sexual Activity: Not on file   Other Topics Concern  . Not on file   Social History Narrative  . No narrative on file    Past Surgical Hx:  Past Surgical History  Procedure Laterality Date  . Cesarean section    . Abdominal hysterectomy      total    Past Medical Hx:  Past Medical History  Diagnosis Date  . Thyroid disease   . Arthritis   . Asthma   . Depression   . Fibromyalgia   . Lumbar spondylolysis   . Allergy   . Endometrial cancer   . Spinal stenosis   . Hypertension   . Dysrhythmia     OCCAS PAC'S AND PVC'S  PER HOLTER MONITOR STUDY --PT HAS OCCAS CHEST PAINS AND PALPITATIONS--HAD ECHO NORMAL LV SIZE AND FUNCTION.  Marland Kitchen Sleep apnea     HAS CPAP MACHINE BUT DOES NOT USE  . Anxiety     HAS OCD AND ON DISABILITY-PER [name]   . Back  pain, chronic   . Obese     Oncology Hx:    Endometrial ca   07/21/2011 Initial Diagnosis Endometrial ca   11/23/2011 Surgery Robotic hysterectomy bilateral salpingo-oophorectomy.    Family Hx:  Family History  Problem Relation Age of Onset  . Hypertension Mother   . Coronary artery disease Father   . Cancer Brother     Renal cell carcinoma  . Hypertension Other   . Cancer Other   . Heart attack Father   . Coronary artery disease Brother     Vitals:  Blood pressure 128/69, pulse 88, temperature 98.6 F (37 C), resp. rate 18, height 4' 9.48" (1.46 m), weight 178 lb (80.74 kg).  Physical Exam: Well-nourished well-developed female very tearful but in no physical acute distress.  Pelvic: External genitalia within normal limits. Speculum examination was inserted in there is a fungating lesion arising from the top of the vagina. Patient was very poorly tolerated exam. Biopsy was performed without difficulty. On bimanual examination there is no tenderness. There is a 5-6 cm mass replacing the top of the  vaginal cuff. Rectovaginal examination confirms no sidewall involvement up to  Assessment/Plan: 58 year old history of a stage IA grade 2 endometrial carcinoma diagnosed and treated in August of 2013. Patient has not returned for any followup since time of her surgery. She now appears to have a vaginal cuff recurrence. We'll followup in results of the biopsy from today. She is a CT scan to rule out metastatic disease later today. We'll contact her with these results. We'll determine her disposition pending them. At the very lesion probably require pelvic radiation with vaginal cuff brachii therapy and referral to radiation oncology. I have called her physicians at the pain clinic at [phone]. They will call me back after the review of her records.   Her contact numbers are [phone]  Greater than 45 minutes face to face time with the patient and her mother. She emotionally would range from being a grade recurrence to being historical crying. Her mother was also and tearful. I told her that I believe that she has a recurrence of her cancer. If it is limited to the top of the vagina she may go to be treated with adjuvant radiation. In this setting it could be potentially curable. However, if she does have evidence of metastatic disease we will need to proceed with chemotherapy. In this setting if she has metastatic disease she may or may not be a curable disease. I stressed to her and she verbalized understanding of the importance of keeping her appointments moving forward. She is very distressed about not been compliant in keeping her appointments previously.   Bert Ptacek A., MD 12/12/2013, 12:45 PM

## 2013-12-12 NOTE — Patient Instructions (Signed)
I will call you with the results of your biopsy and CT scan.

## 2013-12-13 ENCOUNTER — Ambulatory Visit (HOSPITAL_COMMUNITY): Payer: Medicaid Other

## 2013-12-13 ENCOUNTER — Telehealth: Payer: Self-pay | Admitting: Gynecologic Oncology

## 2013-12-13 NOTE — Telephone Encounter (Signed)
Returned call to patient.  Patient called the office three times today during clinic.  [name] began the conversation with questions about her pain medication.  Reinforced the conversation with Dr. [name] from yesterday that we would not be prescribing her pain medication and she would need to follow up with [facility] where she was receiving care.  Stating she did not want to go back there.  Becoming loud and demanding on the phone stating she had to have her pain medication.  Once again, reinforced that we would not be writing for her pain medication.  She then asked about the results of her CT scan.  Results discussed.  Patient's mother on the phone and results discussed as well.  Patient asking about speaking with Dr. [name].  Results forwarded to Dr. [name].  Patient then began yelling on the phone about having to have her pain medication.  In between calls, [facility] notified with a representative stating she has to see a substance abuse counselor before she can be prescribed any more pain medication.  Called the patient back and reinforced the instructions from the pain clinic.  Stating she lost the form with the contact information for the counselor so advised to call the pain clinic to obtain the information again.  Advised to call for any questions or concerns.

## 2013-12-14 NOTE — Telephone Encounter (Signed)
Spoke with patient. I told her that I spoke with Dr. Mirna Mires [phone]) and she can be seen in their clinic and she is "not fired" but she does need to go to a substance abuse clinic and bring that paper work back to them. We discussed her pathology and CT scan. I told her that it was imperative that she make all of her appointments. She said that she can drive. We will get her set up with radiation oncology and medical oncology. I would recommend chemosens with her XRT due to compliance issues.

## 2013-12-17 ENCOUNTER — Other Ambulatory Visit: Payer: Self-pay | Admitting: Gynecologic Oncology

## 2013-12-17 ENCOUNTER — Ambulatory Visit: Payer: Medicaid Other | Admitting: Obstetrics & Gynecology

## 2013-12-17 ENCOUNTER — Telehealth: Payer: Self-pay | Admitting: Gynecologic Oncology

## 2013-12-17 DIAGNOSIS — C541 Malignant neoplasm of endometrium: Secondary | ICD-10-CM

## 2013-12-17 NOTE — Telephone Encounter (Addendum)
Returned call to patient.  Patient calling stating "I have been upset.  I have no one to take me to the cancer center."  She spoke with Dr. [name] on Friday and is aware that she will be receiving a phone call from the [facility] to arrange for appointments with Dr. [name] in [place] and Dr. Marko Plume in [place].  All questions answered at this time.  Advised to call for any questions or concerns.  Chemo class discussed.  Social work referral will be made to address patient's needs with this recurrence.

## 2013-12-18 ENCOUNTER — Telehealth: Payer: Self-pay | Admitting: Oncology

## 2013-12-18 NOTE — Telephone Encounter (Signed)
C/D 12/18/13 for appt. 12/28/13

## 2013-12-18 NOTE — Telephone Encounter (Signed)
s/w patient and gave np appt for 10/02 @ 3 w/Dr. Marko Plume, Chemo edu 09/29 @ 5. Referring Dr. [name] Dx- Endometrial Ca

## 2013-12-21 ENCOUNTER — Telehealth: Payer: Self-pay | Admitting: Gynecologic Oncology

## 2013-12-21 NOTE — Telephone Encounter (Signed)
"  Having more pain but better today.  Afraid the cancer is growing."  Asking about transportation.  Appointments arranged with Dr. Marko Plume and Dr. [name] next week.  Referral sent to social worker after last visit.  Will follow up.  Patient advised to call for any questions or concerns.

## 2013-12-25 ENCOUNTER — Other Ambulatory Visit: Payer: Medicaid Other

## 2013-12-25 ENCOUNTER — Telehealth: Payer: Self-pay | Admitting: *Deleted

## 2013-12-25 NOTE — Progress Notes (Signed)
GYN Location of Tumor / Histology: Endometrial Cancer with vaginal cuff recurrence  [name] presented with a 3 month history of hematuria.  Biopsies revealed:  12/12/13 Diagnosis Vagina, biopsy - INVASIVE ADENOCARCINOMA, SEE COMMENT.  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 - NO EVIDENCE OF ANGIOLYMPHATIC INVASION OR CERVICAL STROMAL INVOLVEMENT IDENTIFIED. - CERVIX: BENIGN SQUAMOUS MUCOSA AND ENDOCERVICAL MUCOSA. NO DYSPLASIA OR MALIGNANCY. - BILATERAL OVARIES AND FALLOPIAN TUBES: NO PATHOLOGIC ABNORMALITIES.  Past/Anticipated interventions by Gyn/Onc surgery, if any: 10/13/11 - abdominal hysterectomy, 12/12/13 - vaginal biopsy  Past/Anticipated interventions by medical oncology, if any: Appointment with Dr. Marko Plume on 12/28/13.  Weight changes, if any:   Bowel/Bladder complaints, if any:   Nausea/Vomiting, if any:  Pain issues, if any:    SAFETY ISSUES:  Prior radiation?   Pacemaker/ICD?   Possible current pregnancy? no  Is the patient on methotrexate? no  Current Complaints / other details:

## 2013-12-25 NOTE — Telephone Encounter (Signed)
No additional note

## 2013-12-26 ENCOUNTER — Other Ambulatory Visit: Payer: Medicaid Other

## 2013-12-26 ENCOUNTER — Ambulatory Visit
Admission: RE | Admit: 2013-12-26 | Discharge: 2013-12-26 | Disposition: A | Discharge status: Home / Self Care | Payer: Medicaid Other | Source: Ambulatory Visit | Attending: Radiation Oncology | Admitting: Radiation Oncology

## 2013-12-26 ENCOUNTER — Encounter: Payer: Self-pay | Admitting: *Deleted

## 2013-12-26 ENCOUNTER — Telehealth: Payer: Self-pay | Admitting: *Deleted

## 2013-12-26 ENCOUNTER — Ambulatory Visit: Payer: Medicaid Other

## 2013-12-26 ENCOUNTER — Telehealth: Payer: Self-pay | Admitting: Oncology

## 2013-12-26 NOTE — Telephone Encounter (Signed)
No other note. 

## 2013-12-26 NOTE — Telephone Encounter (Signed)
pt mother cld wantuing to cancel all appts(new [facility]) trans to [facility]

## 2013-12-26 NOTE — Progress Notes (Signed)
[place] Work  Clinical Social Work was referred by Joylene John, GYN NP, for assessment of psychosocial needs.  Clinical Social Worker contacted patient by phone on 12/24/13 to offer support and assess for needs.  Ms. Sherard shared she has transportation concerns- CSW and patient planned to meet on 12/25/13 before chemotherapy education class.    Per Jethro Bastos, Chemo Education RN, patient canceled appointments on 12/25/13 and cancelled rescheduled appointment on 12/26/13.    CSW contacted patient by phone today.  Ms. Hemrick stated she "does not feel well", but could not specify symptoms.  Patient reports she has pain, but is also fearful of her cancer.  CSW validated patients feelings of fear and anxiety, and re-empasized importance of attending scheduled medical appointments.  CSW and patient discussed the fear of uncertainty and possible modalities to address these concerns is to meet with her medical team.  The patient shared she is currently taking suboxone at a pain clinic and often feels "drugged".  She reports taking benadryl(reports side effect of drug is itching) today which caused her to "feel out of it".  CSW recommended patient not take benadryl on Friday before medical appointments.  Patient reported she drives to her pain clinic appointments or her friend transports her when he is not working.  Ms. Rivere agreed to attend appointment on 12/28/13 with medical oncologist and reschedule radiation oncology appointment.  CSW will meet with patient prior to medical oncology visit.    Polo Riley, MSW, LCSW, OSW-C Clinical Social Worker [facility] [phone]

## 2013-12-26 NOTE — Telephone Encounter (Signed)
Left message for social worker, [name], about phone call from patient's mom.

## 2013-12-28 ENCOUNTER — Ambulatory Visit: Payer: Medicaid Other | Admitting: Oncology

## 2013-12-28 ENCOUNTER — Other Ambulatory Visit: Payer: Medicaid Other

## 2013-12-28 ENCOUNTER — Other Ambulatory Visit: Payer: Self-pay | Admitting: Oncology

## 2013-12-28 ENCOUNTER — Encounter: Payer: Self-pay | Admitting: *Deleted

## 2013-12-28 ENCOUNTER — Ambulatory Visit: Payer: Medicaid Other

## 2013-12-28 DIAGNOSIS — C541 Malignant neoplasm of endometrium: Secondary | ICD-10-CM

## 2013-12-28 NOTE — Progress Notes (Signed)
Clinical Social Work called patient to determine if she would be attending today's medical oncology appointment.  Ms. Poplaski stated "I just can't make it".  Patient shared the weather is an obstacle and she does not want to drive in this weather.  The patient had many medical questions such as "what kind of surgery did I have? Could the surgery have caused my cancer?" "will I have side effects from the chemo?" ... CSW encouraged patient to attend medical appointments to have questions answered by a physician.    CSW provided brief emotional support and discussed importance of patient making the decision to move forward despite fears, obstacles, and concerns.  Patient expressed understanding and agreed to reschedule appointments.  CSW left voicemail from Dr. Marko Plume RN.  Polo Riley, MSW, LCSW, OSW-C Clinical Social Worker [facility] [phone]

## 2014-01-01 ENCOUNTER — Telehealth: Payer: Self-pay | Admitting: *Deleted

## 2014-01-01 ENCOUNTER — Encounter: Payer: Self-pay | Admitting: *Deleted

## 2014-01-01 NOTE — Telephone Encounter (Signed)
Spoke with Jonelle Sidle - she will try to reschedule missed new patient appointment.

## 2014-01-02 ENCOUNTER — Ambulatory Visit: Payer: Medicaid Other | Admitting: Obstetrics & Gynecology

## 2014-01-04 ENCOUNTER — Telehealth: Payer: Self-pay | Admitting: *Deleted

## 2014-01-04 NOTE — Telephone Encounter (Signed)
Patient called asking to speak with [name] in gyn onc to schedule appt with Dr. Marko Plume. Told patient I could help her. She's requesting to get an appointment with Dr. Marko Plume next week. Told patient I would speak with scheduling and call her back today. She specifically requests afternoon appointments only as "I have bad sleep apnea and don't wake up till at least noon."  Called patient back and let her know that there is nothing available next week in the afternoon with Dr. Marko Plume. Patient got extremely upset and starting crying and yelling "I have cancer and y'all are going to let me die." Told patient that I do care and am trying to get her rescheduled. Patient demanding to be seen next week by Dr. Marko Plume. Told patient I would pass this along to her when she comes into the office on Monday and someone will give her a call back. Patient states that anytime of day is now fine and she will come in the morning. Once again stressed the importance of keeping all future appointments at the cancer center. Message given to Dr. Marko Plume.

## 2014-01-06 ENCOUNTER — Encounter (HOSPITAL_COMMUNITY): Payer: Self-pay | Admitting: Emergency Medicine

## 2014-01-06 ENCOUNTER — Emergency Department (HOSPITAL_COMMUNITY): Payer: Medicaid Other

## 2014-01-06 ENCOUNTER — Inpatient Hospital Stay (HOSPITAL_COMMUNITY)
Admission: EM | Admit: 2014-01-06 | Discharge: 2014-01-15 | DRG: 392 | Disposition: A | Discharge status: Home / Self Care | Payer: Medicaid Other | Attending: Internal Medicine | Admitting: Internal Medicine

## 2014-01-06 DIAGNOSIS — E039 Hypothyroidism, unspecified: Secondary | ICD-10-CM | POA: Diagnosis present

## 2014-01-06 DIAGNOSIS — D72829 Elevated white blood cell count, unspecified: Secondary | ICD-10-CM | POA: Diagnosis present

## 2014-01-06 DIAGNOSIS — G893 Neoplasm related pain (acute) (chronic): Secondary | ICD-10-CM | POA: Diagnosis present

## 2014-01-06 DIAGNOSIS — M1990 Unspecified osteoarthritis, unspecified site: Secondary | ICD-10-CM | POA: Diagnosis present

## 2014-01-06 DIAGNOSIS — Z90710 Acquired absence of both cervix and uterus: Secondary | ICD-10-CM | POA: Diagnosis not present

## 2014-01-06 DIAGNOSIS — E669 Obesity, unspecified: Secondary | ICD-10-CM | POA: Diagnosis present

## 2014-01-06 DIAGNOSIS — M797 Fibromyalgia: Secondary | ICD-10-CM | POA: Diagnosis present

## 2014-01-06 DIAGNOSIS — F32A Depression, unspecified: Secondary | ICD-10-CM

## 2014-01-06 DIAGNOSIS — C541 Malignant neoplasm of endometrium: Secondary | ICD-10-CM | POA: Diagnosis present

## 2014-01-06 DIAGNOSIS — K5732 Diverticulitis of large intestine without perforation or abscess without bleeding: Secondary | ICD-10-CM | POA: Diagnosis present

## 2014-01-06 DIAGNOSIS — F42 Obsessive-compulsive disorder: Secondary | ICD-10-CM | POA: Diagnosis present

## 2014-01-06 DIAGNOSIS — Z7289 Other problems related to lifestyle: Secondary | ICD-10-CM

## 2014-01-06 DIAGNOSIS — J45909 Unspecified asthma, uncomplicated: Secondary | ICD-10-CM | POA: Diagnosis present

## 2014-01-06 DIAGNOSIS — R102 Pelvic and perineal pain: Secondary | ICD-10-CM | POA: Diagnosis present

## 2014-01-06 DIAGNOSIS — F919 Conduct disorder, unspecified: Secondary | ICD-10-CM | POA: Diagnosis present

## 2014-01-06 DIAGNOSIS — I10 Essential (primary) hypertension: Secondary | ICD-10-CM

## 2014-01-06 DIAGNOSIS — Z8249 Family history of ischemic heart disease and other diseases of the circulatory system: Secondary | ICD-10-CM

## 2014-01-06 DIAGNOSIS — Z6835 Body mass index (BMI) 35.0-35.9, adult: Secondary | ICD-10-CM

## 2014-01-06 DIAGNOSIS — Z79899 Other long term (current) drug therapy: Secondary | ICD-10-CM

## 2014-01-06 DIAGNOSIS — F411 Generalized anxiety disorder: Secondary | ICD-10-CM

## 2014-01-06 DIAGNOSIS — Z8051 Family history of malignant neoplasm of kidney: Secondary | ICD-10-CM | POA: Diagnosis not present

## 2014-01-06 DIAGNOSIS — E038 Other specified hypothyroidism: Secondary | ICD-10-CM

## 2014-01-06 DIAGNOSIS — F332 Major depressive disorder, recurrent severe without psychotic features: Secondary | ICD-10-CM

## 2014-01-06 DIAGNOSIS — C52 Malignant neoplasm of vagina: Secondary | ICD-10-CM

## 2014-01-06 DIAGNOSIS — K5792 Diverticulitis of intestine, part unspecified, without perforation or abscess without bleeding: Secondary | ICD-10-CM | POA: Diagnosis present

## 2014-01-06 DIAGNOSIS — E876 Hypokalemia: Secondary | ICD-10-CM | POA: Diagnosis present

## 2014-01-06 DIAGNOSIS — Z609 Problem related to social environment, unspecified: Secondary | ICD-10-CM | POA: Diagnosis present

## 2014-01-06 DIAGNOSIS — F329 Major depressive disorder, single episode, unspecified: Secondary | ICD-10-CM

## 2014-01-06 DIAGNOSIS — F0631 Mood disorder due to known physiological condition with depressive features: Secondary | ICD-10-CM

## 2014-01-06 DIAGNOSIS — R509 Fever, unspecified: Secondary | ICD-10-CM

## 2014-01-06 DIAGNOSIS — K5712 Diverticulitis of small intestine without perforation or abscess without bleeding: Secondary | ICD-10-CM

## 2014-01-06 LAB — URINALYSIS, ROUTINE W REFLEX MICROSCOPIC
BILIRUBIN URINE: NEGATIVE
Glucose, UA: NEGATIVE mg/dL
KETONES UR: NEGATIVE mg/dL
NITRITE: NEGATIVE
PH: 6 (ref 5.0–8.0)
PROTEIN: NEGATIVE mg/dL
Specific Gravity, Urine: 1.01 (ref 1.005–1.030)
Urobilinogen, UA: 0.2 mg/dL (ref 0.0–1.0)

## 2014-01-06 LAB — COMPREHENSIVE METABOLIC PANEL WITH GFR
ALT: 14 U/L (ref 0–35)
AST: 20 U/L (ref 0–37)
Albumin: 3.5 g/dL (ref 3.5–5.2)
Alkaline Phosphatase: 58 U/L (ref 39–117)
Anion gap: 14 (ref 5–15)
BUN: 8 mg/dL (ref 6–23)
CO2: 24 meq/L (ref 19–32)
Calcium: 9.2 mg/dL (ref 8.4–10.5)
Chloride: 100 meq/L (ref 96–112)
Creatinine, Ser: 0.69 mg/dL (ref 0.50–1.10)
GFR calc Af Amer: >90 mL/min
GFR calc non Af Amer: >90 mL/min
Glucose, Bld: 151 mg/dL — ABNORMAL HIGH (ref 70–99)
Potassium: 4.4 meq/L (ref 3.7–5.3)
Sodium: 138 meq/L (ref 137–147)
Total Bilirubin: 0.5 mg/dL (ref 0.3–1.2)
Total Protein: 7.7 g/dL (ref 6.0–8.3)

## 2014-01-06 LAB — CBC WITH DIFFERENTIAL/PLATELET
BASOS PCT: 0 % (ref 0–1)
Basophils Absolute: 0 10*3/uL (ref 0.0–0.1)
Eosinophils Absolute: 0.2 10*3/uL (ref 0.0–0.7)
Eosinophils Relative: 2 % (ref 0–5)
HEMATOCRIT: 39.8 % (ref 36.0–46.0)
HEMOGLOBIN: 13.9 g/dL (ref 12.0–15.0)
LYMPHS ABS: 2.1 10*3/uL (ref 0.7–4.0)
Lymphocytes Relative: 17 % (ref 12–46)
MCH: 30.7 pg (ref 26.0–34.0)
MCHC: 34.9 g/dL (ref 30.0–36.0)
MCV: 87.9 fL (ref 78.0–100.0)
MONO ABS: 1.1 10*3/uL — AB (ref 0.1–1.0)
Monocytes Relative: 9 % (ref 3–12)
NEUTROS ABS: 8.9 10*3/uL — AB (ref 1.7–7.7)
NEUTROS PCT: 72 % (ref 43–77)
Platelets: 193 10*3/uL (ref 150–400)
RBC: 4.53 MIL/uL (ref 3.87–5.11)
RDW: 12.5 % (ref 11.5–15.5)
WBC: 12.3 10*3/uL — AB (ref 4.0–10.5)

## 2014-01-06 LAB — URINE MICROSCOPIC-ADD ON

## 2014-01-06 MED ORDER — CIPROFLOXACIN IN D5W 400 MG/200ML IV SOLN
400.0000 mg | Freq: Once | INTRAVENOUS | Status: DC
  Administered 2014-01-06: 400 mg via INTRAVENOUS
  Filled 2014-01-06: qty 200

## 2014-01-06 MED ORDER — SODIUM CHLORIDE 0.9 % IV BOLUS (SEPSIS)
1000.0000 mL | Freq: Once | INTRAVENOUS | Status: AC
  Administered 2014-01-06: 1000 mL via INTRAVENOUS

## 2014-01-06 MED ORDER — ONDANSETRON HCL 4 MG/2ML IJ SOLN
4.0000 mg | Freq: Four times a day (QID) | INTRAMUSCULAR | Status: DC | PRN
  Administered 2014-01-07 – 2014-01-08 (×2): 4 mg via INTRAVENOUS
  Filled 2014-01-06 (×2): qty 2

## 2014-01-06 MED ORDER — ONDANSETRON HCL 4 MG PO TABS: 4.0000 mg | ORAL_TABLET | Freq: Four times a day (QID) | ORAL | Status: DC | PRN

## 2014-01-06 MED ORDER — HYDROMORPHONE HCL 2 MG/ML IJ SOLN
2.0000 mg | Freq: Once | INTRAMUSCULAR | Status: AC
  Administered 2014-01-06: 2 mg via INTRAVENOUS
  Filled 2014-01-06: qty 1

## 2014-01-06 MED ORDER — THIOTHIXENE 2 MG PO CAPS
2.0000 mg | ORAL_CAPSULE | Freq: Every day | ORAL | Status: DC
  Administered 2014-01-06 – 2014-01-07 (×2): 2 mg via ORAL
  Filled 2014-01-06 (×3): qty 1

## 2014-01-06 MED ORDER — ONDANSETRON HCL 4 MG/2ML IJ SOLN
4.0000 mg | Freq: Once | INTRAMUSCULAR | Status: AC
  Administered 2014-01-06: 4 mg via INTRAVENOUS
  Filled 2014-01-06: qty 2

## 2014-01-06 MED ORDER — LORAZEPAM 2 MG/ML IJ SOLN
1.0000 mg | Freq: Once | INTRAMUSCULAR | Status: AC
  Administered 2014-01-06: 1 mg via INTRAVENOUS
  Filled 2014-01-06: qty 1

## 2014-01-06 MED ORDER — HYDROMORPHONE HCL 1 MG/ML IJ SOLN
1.0000 mg | Freq: Once | INTRAMUSCULAR | Status: AC
  Administered 2014-01-06: 1 mg via INTRAVENOUS
  Filled 2014-01-06: qty 1

## 2014-01-06 MED ORDER — HYDROMORPHONE HCL 1 MG/ML IJ SOLN
1.0000 mg | INTRAMUSCULAR | Status: DC | PRN
  Administered 2014-01-06: 1 mg via INTRAVENOUS
  Filled 2014-01-06: qty 1

## 2014-01-06 MED ORDER — CIPROFLOXACIN IN D5W 400 MG/200ML IV SOLN
400.0000 mg | Freq: Two times a day (BID) | INTRAVENOUS | Status: DC
  Administered 2014-01-07 – 2014-01-13 (×12): 400 mg via INTRAVENOUS
  Filled 2014-01-06 (×14): qty 200

## 2014-01-06 MED ORDER — ALBUTEROL SULFATE (2.5 MG/3ML) 0.083% IN NEBU: 2.5000 mg | INHALATION_SOLUTION | RESPIRATORY_TRACT | Status: DC | PRN

## 2014-01-06 MED ORDER — HYDROCODONE-ACETAMINOPHEN 5-325 MG PO TABS
1.0000 | ORAL_TABLET | ORAL | Status: DC | PRN
  Administered 2014-01-06: 2 via ORAL
  Filled 2014-01-06 (×2): qty 2

## 2014-01-06 MED ORDER — LORAZEPAM 1 MG PO TABS
1.0000 mg | ORAL_TABLET | Freq: Two times a day (BID) | ORAL | Status: DC
  Administered 2014-01-06 – 2014-01-15 (×18): 1 mg via ORAL
  Filled 2014-01-06 (×18): qty 1

## 2014-01-06 MED ORDER — LEVOTHYROXINE SODIUM 125 MCG PO TABS
125.0000 ug | ORAL_TABLET | Freq: Every day | ORAL | Status: DC
  Administered 2014-01-07 – 2014-01-15 (×9): 125 ug via ORAL
  Filled 2014-01-06 (×11): qty 1

## 2014-01-06 MED ORDER — METRONIDAZOLE IN NACL 5-0.79 MG/ML-% IV SOLN
500.0000 mg | Freq: Three times a day (TID) | INTRAVENOUS | Status: DC
  Administered 2014-01-06 – 2014-01-13 (×20): 500 mg via INTRAVENOUS
  Filled 2014-01-06 (×21): qty 100

## 2014-01-06 MED ORDER — SERTRALINE HCL 100 MG PO TABS
200.0000 mg | ORAL_TABLET | Freq: Every evening | ORAL | Status: DC
  Administered 2014-01-06 – 2014-01-14 (×9): 200 mg via ORAL
  Filled 2014-01-06 (×10): qty 2

## 2014-01-06 MED ORDER — IOHEXOL 300 MG/ML  SOLN
100.0000 mL | Freq: Once | INTRAMUSCULAR | Status: AC | PRN
  Administered 2014-01-06: 100 mL via INTRAVENOUS

## 2014-01-06 MED ORDER — IOHEXOL 300 MG/ML  SOLN
50.0000 mL | Freq: Once | INTRAMUSCULAR | Status: AC | PRN
  Administered 2014-01-06: 50 mL via ORAL

## 2014-01-06 MED ORDER — METRONIDAZOLE IN NACL 5-0.79 MG/ML-% IV SOLN
500.0000 mg | Freq: Once | INTRAVENOUS | Status: DC
  Filled 2014-01-06: qty 100

## 2014-01-06 MED ORDER — SODIUM CHLORIDE 0.9 % IV SOLN
INTRAVENOUS | Status: AC
  Administered 2014-01-06 – 2014-01-07 (×2): via INTRAVENOUS

## 2014-01-06 MED ORDER — DIAZEPAM 5 MG/ML IJ SOLN
5.0000 mg | Freq: Once | INTRAMUSCULAR | Status: AC
  Administered 2014-01-06: 5 mg via INTRAVENOUS
  Filled 2014-01-06: qty 2

## 2014-01-06 MED ORDER — ENOXAPARIN SODIUM 40 MG/0.4ML ~~LOC~~ SOLN
40.0000 mg | Freq: Every day | SUBCUTANEOUS | Status: DC
  Administered 2014-01-06 – 2014-01-12 (×6): 40 mg via SUBCUTANEOUS
  Filled 2014-01-06 (×8): qty 0.4

## 2014-01-06 NOTE — ED Notes (Signed)
Pt is agitated and yelling for pain medication and refusing CT until she gets some. Dr Darl Householder notified. Upon return to room, pt has changed her mind and wants to go to CT then get meds.

## 2014-01-06 NOTE — ED Notes (Signed)
Called unit for report. Nurse unavailable to take report

## 2014-01-06 NOTE — ED Notes (Signed)
Pt up to restroom with ambulation assistance  of 1 staff member RN made aware of pt stating that she was still in pain.

## 2014-01-06 NOTE — H&P (Signed)
[facility] History and Physical  [name] YIR:[id number] DOB: 03/06/1956 DOA: 01/06/2014  Referring physician: ED physician PCP: [name], FNP   Chief Complaint: abdominal pain   HPI:  Pt is 58 yo female with hypothyroidism, endometrial cancer with recent recurrence, now presenting to [facility] ED with main concern of several days duration of progressively worsening generalized abd pain, constant and sharp, radiating to the lower abd quadrants L >  R side, worse with eating and with no specific alleviating factors, no similar events in the past. Pt denies recent sick contacts or exposures, also denies chest pain or shortness of breath. She has had poor oral intake due to abd pain.  In ED, pt noted to be in mild distress due to abd pain, VSS notable for T 99.4, O2 sat 92% on RA, WBC 12.3, Ct abd with acute sigmoid diverticulitis. Pt started on Cipro and Flagyl IV and TRH asked to admit for further evaluation.   Assessment and Plan: Active Problems: Acute Diverticulitis - admit to medical unit  - place on Cipro and Flagyl IV until oral intake improved - provide IVF, analgesia, antiemetics as needed Endometrial cancer - recent recurrence and slightly larger pelvic mass  - pt has missed several appointments, will need close outpatient follow up  Leukocytosis - secondary to acute diverticulitis  - ABX as noted above and repeat CBC in AM Hypothyroidism - continue synthroid   Lovenox SQ for DVT prophylaxis   Radiological Exams on Admission: Ct Abdomen Pelvis W Contrast  01/06/2014   Slightly larger pelvic mass and relatively stable pelvic adenopathy.  Acute sigmoid diverticulitis without complicating features.    Code Status: Full Family Communication: Pt at bedside Disposition Plan: Admit for further evaluation    Review of Systems:  Constitutional: Negative for diaphoresis.  HENT: Negative for hearing loss, ear pain, nosebleeds, congestion, sore throat, neck  pain, tinnitus and ear discharge.   Eyes: Negative for blurred vision, double vision, photophobia, pain, discharge and redness.  Respiratory: Negative for cough, hemoptysis, sputum production, shortness of breath, wheezing and stridor.   Cardiovascular: Negative for chest pain, palpitations, orthopnea, claudication and leg swelling.  Gastrointestinal: Per HPI  Genitourinary: Negative for dysuria, urgency, frequency, hematuria and flank pain.  Musculoskeletal: Negative for myalgias, back pain, joint pain and falls.  Skin: Negative for itching and rash.  Neurological: Negative for dizziness and weakness.  Endo/Heme/Allergies: Negative for environmental allergies and polydipsia. Does not bruise/bleed easily.  Psychiatric/Behavioral: Negative for suicidal ideas. The patient is not nervous/anxious.      Past Medical History  Diagnosis Date  . Thyroid disease   . Arthritis   . Asthma   . Depression   . Fibromyalgia   . Lumbar spondylolysis   . Allergy   . Endometrial cancer   . Spinal stenosis   . Hypertension   . Dysrhythmia     OCCAS PAC'S AND PVC'S  PER HOLTER MONITOR STUDY --PT HAS OCCAS CHEST PAINS AND PALPITATIONS--HAD ECHO NORMAL LV SIZE AND FUNCTION.  Marland Kitchen Sleep apnea     HAS CPAP MACHINE BUT DOES NOT USE  . Anxiety     HAS OCD AND ON DISABILITY-PER [name]   . Back pain, chronic   . Obese     Past Surgical History  Procedure Laterality Date  . Cesarean section    . Abdominal hysterectomy      total    Social History:  reports that she has never smoked. She has never used smokeless tobacco. She  reports that she does not drink alcohol or use illicit drugs.  Allergies  Allergen Reactions  . Sulfa Antibiotics Other (See Comments)    Unknown-pt states she is unsure of what her reaction is  . Sulfamethoxazole-Trimethoprim Other (See Comments)    Unknown reaction    Family History  Problem Relation Age of Onset  . Hypertension Mother   . Coronary artery disease  Father   . Cancer Brother     Renal cell carcinoma  . Hypertension Other   . Cancer Other   . Heart attack Father   . Coronary artery disease Brother     Prior to Admission medications   Medication Sig Start Date End Date Taking? Authorizing Provider  albuterol (VENTOLIN HFA) 108 (90 BASE) MCG/ACT inhaler Inhale 2 puffs into the lungs. 05/08/12  Yes Historical Provider, MD  estradiol (ESTRACE) 0.5 MG tablet Take 1 tablet (0.5 mg total) by mouth every evening. 11/06/13  Yes Lahoma Crocker, MD  levothyroxine (SYNTHROID, LEVOTHROID) 125 MCG tablet Take 125 mcg by mouth daily before breakfast.   Yes Historical Provider, MD  LORazepam (ATIVAN) 1 MG tablet Take 1 tablet (1 mg total) by mouth 2 (two) times daily. 02/23/12  Yes Shari A Upstill, PA-C  sertraline (ZOLOFT) 100 MG tablet Take 200 mg by mouth every evening.  10/15/11  Yes Srikar Janna Arch, MD  thiothixene (NAVANE) 2 MG capsule Take 1 capsule (2 mg total) by mouth at bedtime. 03/07/12  Yes Carlisle Cater, PA-C  Vitamin D, Ergocalciferol, (DRISDOL) 50000 UNITS CAPS capsule Take 50,000 Units by mouth every 7 (seven) days. No specific days   Yes Historical Provider, MD    Physical Exam: Filed Vitals:   01/06/14 1606 01/06/14 1714 01/06/14 1826  BP: 125/71 112/64 97/69  Pulse: 96 91 93  Temp: 99.4 F (37.4 C)    TempSrc: Oral    Resp: 18 16 16   SpO2: 96% 91% 97%    Physical Exam  Constitutional: Appears well-developed and well-nourished. No distress.  HENT: Normocephalic. External right and left ear normal. Oropharynx is clear and moist.  Eyes: Conjunctivae and EOM are normal. PERRLA, no scleral icterus.  Neck: Normal ROM. Neck supple. No JVD. No tracheal deviation. No thyromegaly.  CVS: RRR, S1/S2 +, no murmurs, no gallops, no carotid bruit.  Pulmonary: Effort and breath sounds normal, no stridor, rhonchi, wheezes, rales.  Abdominal: Soft. BS +,  no distension, tenderness, rebound or guarding.  Musculoskeletal: Normal range of  motion. No edema and no tenderness.  Lymphadenopathy: No lymphadenopathy noted, cervical, inguinal. Neuro: Alert. Normal reflexes, muscle tone coordination. No cranial nerve deficit. Skin: Skin is warm and dry. No rash noted. Not diaphoretic. No erythema. No pallor.  Psychiatric: Normal mood and affect. Behavior, judgment, thought content normal.   Labs on Admission:  Basic Metabolic Panel:  Recent Labs Lab 01/06/14 1601  NA 138  K 4.4  CL 100  CO2 24  GLUCOSE 151*  BUN 8  CREATININE 0.69  CALCIUM 9.2   Liver Function Tests:  Recent Labs Lab 01/06/14 1601  AST 20  ALT 14  ALKPHOS 58  BILITOT 0.5  PROT 7.7  ALBUMIN 3.5   CBC:  Recent Labs Lab 01/06/14 1601  WBC 12.3*  NEUTROABS 8.9*  HGB 13.9  HCT 39.8  MCV 87.9  PLT 193    EKG: Normal sinus rhythm, no ST/T wave changes  Faye Ramsay, MD  [facility] Pager [phone]  If 7PM-7AM, please contact night-coverage [email or web address] Password [facility] 01/06/2014, 8:48 PM

## 2014-01-06 NOTE — ED Provider Notes (Addendum)
CSN: [id number]     Arrival date & time 01/06/14  1539 History   First MD Initiated Contact with Patient 01/06/14 1542     Chief Complaint  Patient presents with  . Vaginal Pain     (Consider location/radiation/quality/duration/timing/severity/associated sxs/prior Treatment) The history is provided by the patient.  [name] is a 58 y.o. female hx of hypothyroidism, endometrial cancer with recent recurrence here with lower pelvic pain and vaginal bleeding. Patient was recently diagnosed with recurrent endometrial cancer. She said that she is not currently on pain medicines. She was suppose to start chemo but she missed several appointments. Patient had some vaginal spotting and worsening vaginal pain. She denies vomiting or fever or urinary symptoms or diarrhea.    Past Medical History  Diagnosis Date  . Thyroid disease   . Arthritis   . Asthma   . Depression   . Fibromyalgia   . Lumbar spondylolysis   . Allergy   . Endometrial cancer   . Spinal stenosis   . Hypertension   . Dysrhythmia     OCCAS PAC'S AND PVC'S  PER HOLTER MONITOR STUDY --PT HAS OCCAS CHEST PAINS AND PALPITATIONS--HAD ECHO NORMAL LV SIZE AND FUNCTION.  Marland Kitchen Sleep apnea     HAS CPAP MACHINE BUT DOES NOT USE  . Anxiety     HAS OCD AND ON DISABILITY-PER [name]   . Back pain, chronic   . Obese    Past Surgical History  Procedure Laterality Date  . Cesarean section    . Abdominal hysterectomy      total   Family History  Problem Relation Age of Onset  . Hypertension Mother   . Coronary artery disease Father   . Cancer Brother     Renal cell carcinoma  . Hypertension Other   . Cancer Other   . Heart attack Father   . Coronary artery disease Brother    History  Substance Use Topics  . Smoking status: Never Smoker   . Smokeless tobacco: Never Used  . Alcohol Use: No   OB History   Grav Para Term Preterm Abortions TAB SAB Ect Mult Living   1 1             Review of Systems   Genitourinary: Positive for vaginal bleeding and vaginal pain.  All other systems reviewed and are negative.     Allergies  Sulfa antibiotics and Sulfamethoxazole-trimethoprim  Home Medications   Prior to Admission medications   Medication Sig Start Date End Date Taking? Authorizing Provider  albuterol (VENTOLIN HFA) 108 (90 BASE) MCG/ACT inhaler Inhale 2 puffs into the lungs. 05/08/12  Yes Historical Provider, MD  estradiol (ESTRACE) 0.5 MG tablet Take 1 tablet (0.5 mg total) by mouth every evening. 11/06/13  Yes Lahoma Crocker, MD  levothyroxine (SYNTHROID, LEVOTHROID) 125 MCG tablet Take 125 mcg by mouth daily before breakfast.   Yes Historical Provider, MD  LORazepam (ATIVAN) 1 MG tablet Take 1 tablet (1 mg total) by mouth 2 (two) times daily. 02/23/12  Yes Shari A Upstill, PA-C  sertraline (ZOLOFT) 100 MG tablet Take 200 mg by mouth every evening.  10/15/11  Yes Srikar Janna Arch, MD  thiothixene (NAVANE) 2 MG capsule Take 1 capsule (2 mg total) by mouth at bedtime. 03/07/12  Yes Carlisle Cater, PA-C  Vitamin D, Ergocalciferol, (DRISDOL) 50000 UNITS CAPS capsule Take 50,000 Units by mouth every 7 (seven) days. No specific days   Yes Historical Provider, MD   BP [phone]  Pulse 93  Temp(Src) 99.4 F (37.4 C) (Oral)  Resp 16  SpO2 97% Physical Exam  Nursing note and vitals reviewed. Constitutional: She is oriented to person, place, and time.  Crying, tearful, anxious, uncomfortable   HENT:  Head: Normocephalic.  Mouth/Throat: Oropharynx is clear and moist.  Eyes: Conjunctivae are normal. Pupils are equal, round, and reactive to light.  Neck: Normal range of motion. Neck supple.  Cardiovascular: Normal rate, regular rhythm and normal heart sounds.   Pulmonary/Chest: Effort normal and breath sounds normal. No respiratory distress. She has no wheezes. She has no rales.  Abdominal: Soft. Bowel sounds are normal.  + lower pelvic tenderness, no rebound   Genitourinary:  Some vaginal  mass, minimal discharge. Very tender on exam.   Musculoskeletal: Normal range of motion. She exhibits no edema and no tenderness.  Neurological: She is alert and oriented to person, place, and time. No cranial nerve deficit. Coordination normal.  Skin: Skin is warm and dry.  Psychiatric: She has a normal mood and affect. Her behavior is normal. Judgment and thought content normal.    ED Course  Procedures (including critical care time) Labs Review Labs Reviewed  CBC WITH DIFFERENTIAL - Abnormal; Notable for the following:    WBC 12.3 (*)    Neutro Abs 8.9 (*)    Monocytes Absolute 1.1 (*)    All other components within normal limits  COMPREHENSIVE METABOLIC PANEL - Abnormal; Notable for the following:    Glucose, Bld 151 (*)    All other components within normal limits  URINALYSIS, ROUTINE W REFLEX MICROSCOPIC - Abnormal; Notable for the following:    Hgb urine dipstick SMALL (*)    Leukocytes, UA SMALL (*)    All other components within normal limits  URINE MICROSCOPIC-ADD ON    Imaging Review Ct Abdomen Pelvis W Contrast  01/06/2014   CLINICAL DATA:  Severe lower pelvic pain which began today. Patient has a history of endometrial cancer.  EXAM: CT ABDOMEN AND PELVIS WITH CONTRAST  TECHNIQUE: Multidetector CT imaging of the abdomen and pelvis was performed using the standard protocol following bolus administration of intravenous contrast.  CONTRAST:  12mL OMNIPAQUE IOHEXOL 300 MG/ML SOLN, 168mL OMNIPAQUE IOHEXOL 300 MG/ML SOLN  COMPARISON:  12/12/2013  FINDINGS: Lower chest: The lung bases are clear of acute process. No pleural effusion or pulmonary lesions. The heart is normal in size. No pericardial effusion. The distal esophagus and aorta are unremarkable.  Hepatobiliary: Mild diffuse fatty infiltration of the liver but no focal hepatic lesions or intrahepatic biliary dilatation. The gallbladder is normal. No common bowel duct dilatation.  Pancreas: Normal.  Spleen: Stable  splenomegaly.  Adrenals/Urinary Tract: Normal.  Stomach/Bowel: The stomach, duodenum, and small bowel are unremarkable. The terminal ileum is normal. The colon is normal except for focal acute diverticulitis involving the mid sigmoid colon. There is tumor surrounding the colon at this level but this appears to be an acute inflammatory process.  Vascular/Lymphatic: The aorta and branch vessels are patent. The major venous structures are patent. There are several small scattered mesenteric and retroperitoneal lymph nodes which appear relatively stable. Stable enlarged pelvic sidewall lymphadenopathy bilaterally. The necrotic locking pelvic mass is slightly larger. It previously measured a maximum of 7 x 3.7 cm and now measures 7.8 x 4.0 cm.  Reproductive: The uterus and ovaries are surgically absent. The bladder appears grossly normal. Could not exclude involvement of the left bladder wall by the pelvic mass.  Other:  Musculoskeletal: No significant findings.  IMPRESSION: Slightly larger pelvic mass and relatively stable pelvic adenopathy.  Acute sigmoid diverticulitis without complicating features.   Electronically Signed   By: Kalman Jewels M.D.   On: 01/06/2014 20:03     EKG Interpretation None      MDM   Final diagnoses:  None    [name] is a 58 y.o. female here with vaginal pain. Likely pain from recurrent cancer. Will repeat CT to assess status of cancer. Will give pain meds and reassess.   8:19 PM CT showed larger pelvic mass. Also acute sigmoid diverticulitis. WBC 13. Still in pain despite multiple rounds of pain meds. Given cipro, flagyl. Will admit.    Wandra Arthurs, MD 01/06/14 2020  Wandra Arthurs, MD 01/06/14 2021

## 2014-01-06 NOTE — ED Notes (Signed)
She has hx of recurrent endometrial cancer; and states she has had severe vaginal pain x 2 days with some vaginal bleeding (not heavy).  She is quite anxious, and her mother is with her.

## 2014-01-06 NOTE — ED Notes (Signed)
Bed: [facility]60 Expected date: 01/06/14 Expected time: 3:35 PM Means of arrival: Ambulance Comments: Vag pain, Cancer pt

## 2014-01-07 ENCOUNTER — Encounter (HOSPITAL_COMMUNITY): Payer: Self-pay | Admitting: Surgery

## 2014-01-07 DIAGNOSIS — F411 Generalized anxiety disorder: Secondary | ICD-10-CM | POA: Diagnosis present

## 2014-01-07 DIAGNOSIS — C541 Malignant neoplasm of endometrium: Secondary | ICD-10-CM

## 2014-01-07 DIAGNOSIS — E039 Hypothyroidism, unspecified: Secondary | ICD-10-CM

## 2014-01-07 DIAGNOSIS — F329 Major depressive disorder, single episode, unspecified: Secondary | ICD-10-CM

## 2014-01-07 DIAGNOSIS — K5712 Diverticulitis of small intestine without perforation or abscess without bleeding: Secondary | ICD-10-CM

## 2014-01-07 DIAGNOSIS — F919 Conduct disorder, unspecified: Secondary | ICD-10-CM | POA: Diagnosis present

## 2014-01-07 LAB — URINE MICROSCOPIC-ADD ON

## 2014-01-07 LAB — BASIC METABOLIC PANEL
ANION GAP: 11 (ref 5–15)
BUN: 6 mg/dL (ref 6–23)
CHLORIDE: 101 meq/L (ref 96–112)
CO2: 25 meq/L (ref 19–32)
CREATININE: 0.67 mg/dL (ref 0.50–1.10)
Calcium: 8.7 mg/dL (ref 8.4–10.5)
GFR calc Af Amer: >90 mL/min (ref >90)
GFR calc non Af Amer: >90 mL/min (ref >90)
Glucose, Bld: 104 mg/dL — ABNORMAL HIGH (ref 70–99)
Potassium: 4 mEq/L (ref 3.7–5.3)
Sodium: 137 mEq/L (ref 137–147)

## 2014-01-07 LAB — RAPID URINE DRUG SCREEN, HOSP PERFORMED
Amphetamines: NOT DETECTED
BENZODIAZEPINES: NOT DETECTED
Barbiturates: NOT DETECTED
Cocaine: NOT DETECTED
Opiates: POSITIVE — AB
TETRAHYDROCANNABINOL: NOT DETECTED

## 2014-01-07 LAB — URINALYSIS, ROUTINE W REFLEX MICROSCOPIC
Bilirubin Urine: NEGATIVE
GLUCOSE, UA: NEGATIVE mg/dL
Ketones, ur: NEGATIVE mg/dL
Nitrite: NEGATIVE
Protein, ur: NEGATIVE mg/dL
Specific Gravity, Urine: 1.016 (ref 1.005–1.030)
Urobilinogen, UA: 1 mg/dL (ref 0.0–1.0)
pH: 6.5 (ref 5.0–8.0)

## 2014-01-07 LAB — CBC
HEMATOCRIT: 35.3 % — AB (ref 36.0–46.0)
HEMOGLOBIN: 12 g/dL (ref 12.0–15.0)
MCH: 29.5 pg (ref 26.0–34.0)
MCHC: 34 g/dL (ref 30.0–36.0)
MCV: 86.7 fL (ref 78.0–100.0)
Platelets: 180 10*3/uL (ref 150–400)
RBC: 4.07 MIL/uL (ref 3.87–5.11)
RDW: 12.5 % (ref 11.5–15.5)
WBC: 11 10*3/uL — ABNORMAL HIGH (ref 4.0–10.5)

## 2014-01-07 LAB — TSH: TSH: 1.41 u[IU]/mL (ref 0.350–4.500)

## 2014-01-07 MED ORDER — CHLORHEXIDINE GLUCONATE 0.12 % MT SOLN
15.0000 mL | Freq: Two times a day (BID) | OROMUCOSAL | Status: DC
  Administered 2014-01-08 – 2014-01-10 (×2): 15 mL via OROMUCOSAL
  Filled 2014-01-07 (×18): qty 15

## 2014-01-07 MED ORDER — ACETAMINOPHEN 325 MG PO TABS
650.0000 mg | ORAL_TABLET | Freq: Four times a day (QID) | ORAL | Status: DC | PRN
  Administered 2014-01-07: 650 mg via ORAL
  Filled 2014-01-07 (×2): qty 2

## 2014-01-07 MED ORDER — SODIUM CHLORIDE 0.9 % IV SOLN
INTRAVENOUS | Status: DC
  Administered 2014-01-07: 23:00:00 via INTRAVENOUS

## 2014-01-07 MED ORDER — CETYLPYRIDINIUM CHLORIDE 0.05 % MT LIQD
7.0000 mL | Freq: Two times a day (BID) | OROMUCOSAL | Status: DC
  Administered 2014-01-10 (×2): 7 mL via OROMUCOSAL

## 2014-01-07 MED ORDER — HYDROMORPHONE HCL 1 MG/ML IJ SOLN
1.0000 mg | INTRAMUSCULAR | Status: DC | PRN
Start: 2014-01-07 — End: 2014-01-10
  Administered 2014-01-07 (×5): 1.5 mg via INTRAVENOUS
  Administered 2014-01-08: 1 mg via INTRAVENOUS
  Administered 2014-01-08 – 2014-01-10 (×11): 1.5 mg via INTRAVENOUS
  Administered 2014-01-10: 1 mg via INTRAVENOUS
  Administered 2014-01-10: 1.5 mg via INTRAVENOUS
  Filled 2014-01-07 (×2): qty 2
  Filled 2014-01-07 (×2): qty 1
  Filled 2014-01-07 (×15): qty 2

## 2014-01-07 MED ORDER — OXYCODONE HCL 5 MG PO TABS
5.0000 mg | ORAL_TABLET | Freq: Once | ORAL | Status: AC
  Administered 2014-01-07: 5 mg via ORAL
  Filled 2014-01-07: qty 1

## 2014-01-07 NOTE — Progress Notes (Signed)
Progress Note   [name] IRS:[id number] DOB: 1955-11-13 DOA: 01/06/2014 PCP: [name], FNP   Brief Narrative:   [name] is an 58 y.o. 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 She subsequently was scheduled to see Dr. Marko Plume on 12/28/13 but did not keep appointment. She presented to the ER 01/06/14 with a chief complaint of abdominal pain and was found to have acute sigmoid diverticulitis on CT.  Assessment/Plan:   Principal Problem:   Diverticulitis  Continue Cipro/Flagyl.  Continue IV fluids and supportive care with analgesics, pain medication.  Active Problems:   Behavior disturbance  Patient has a history of possible polysubstance abuse, is on psychotropic medication, and has had behavioral outbursts on the nursing unit.  We'll ask psychiatrist to evaluate.    Hypothyroidism  Continue Synthroid.  Given issues with compliance, check TSH.    Endometrial ca  Outpatient followup with Dr. Marko Plume for further evaluation/treatment recommendations.  No role for inpatient chemotherapy well the patient is undergoing treatment for an active infection.    Depression / Anxiety state  Continue Zoloft, Navane, and when necessary Ativan.    DVT Prophylaxis   Continue Lovenox.  Code Status: Full. Family Communication: Mother updated at the bedside. Disposition Plan: Home when stable.   IV Access:    Peripheral IV   Procedures and diagnostic studies:   Ct Abdomen Pelvis W Contrast 01/06/2014: Slightly larger pelvic mass and relatively stable pelvic  adenopathy.  Acute sigmoid diverticulitis without complicating features.      Medical Consultants:    Psychiatry   Other Consultants:    None.   Anti-Infectives:    Cipro 01/06/14--->  Flagyl 01/06/14--->  Subjective:   Tracy General Mcdaniel has had behavioral outbursts with the nursing staff, his pain medication seeking, and is hysterical at times. Her mother is at the bedside with her and there is a strong odor of urine in the room. Nursing staff reports that her mother has been incontinent of urine.  Objective:    Filed Vitals:   01/06/14 2030 01/06/14 2100 01/06/14 2234 01/07/14 0553  BP: 121/72 100/72 112/65 91/62  Pulse: 90 91 101 89  Temp:   100.2 F (37.9 C) 99.7 F (37.6 C)  TempSrc:   Oral Oral  Resp:   16 16  Height:   4\' 11"  (1.499 m)   Weight:   79.243 kg (174 lb 11.2 oz)   SpO2: 97% 91% 96% 93%    Intake/Output Summary (Last 24 hours) at 01/07/14 0757 Last data filed at 01/07/14 0634  Gross per 24 hour  Intake  850.9 ml  Output    300 ml  Net  550.9 ml    Exam: Gen:  Mood labile Cardiovascular:  RRR, No M/R/G Respiratory:  Lungs CTAB Gastrointestinal:  Abdomen soft, lower abdominal tenderness, + BS Extremities:  No C/E/C   Data Reviewed:    Labs: Basic Metabolic Panel:  Recent Labs Lab 01/06/14 1601 01/07/14 0440  NA 138 137  K 4.4 4.0  CL 100 101  CO2 24 25  GLUCOSE 151* 104*  BUN 8 6  CREATININE 0.69  0.67  CALCIUM 9.2 8.7   GFR Estimated Creatinine Clearance: 68.9 ml/min (by C-G formula based on Cr of 0.67). Liver Function Tests:  Recent Labs Lab 01/06/14 1601  AST 20  ALT 14  ALKPHOS 58  BILITOT 0.5  PROT 7.7  ALBUMIN 3.5   CBC:  Recent Labs Lab 01/06/14 1601 01/07/14 0440  WBC 12.3* 11.0*  NEUTROABS 8.9*  --   HGB 13.9 12.0  HCT 39.8 35.3*  MCV 87.9 86.7  PLT 193 180   Microbiology No results found for this or any previous visit (from the past 240 hour(s)).   Medications:   . ciprofloxacin   400 mg Intravenous Q12H  . enoxaparin (LOVENOX) injection  40 mg Subcutaneous QHS  . levothyroxine  125 mcg Oral QAC breakfast  . LORazepam  1 mg Oral BID  . metronidazole  500 mg Intravenous Q8H  . sertraline  200 mg Oral QPM  . thiothixene  2 mg Oral QHS   Continuous Infusions: . sodium chloride 75 mL/hr at 01/07/14 3532    Time spent: 35 minutes with > 50% of time discussing current diagnostic test results, clinical impression and plan of care.    LOS: 1 day   Stefana Lodico  [facility] Pager [phone]. If unable to reach me by pager, please call my cell phone at [phone].  *Please refer to [email or web address], password [email or web address] to get updated schedule on who will round on this patient, as hospitalists switch teams weekly. If 7PM-7AM, please contact night-coverage at [email or web address], password [email or web address] for any overnight needs.  01/07/2014, 7:57 AM

## 2014-01-07 NOTE — Plan of Care (Signed)
Problem: Phase I Progression Outcomes Goal: Pain controlled with appropriate interventions Outcome: Not Progressing Pain medication increased

## 2014-01-07 NOTE — Care Management Note (Signed)
CARE MANAGEMENT NOTE 01/07/2014  Patient:  [name],[name]   Account Number:  [id number]  Date Initiated:  01/07/2014  Documentation initiated by:  Marney Doctor  Subjective/Objective Assessment:   58 yo admitted with abd pain.  Hx of  Endometrial ca and polysubstance abuse     Action/Plan:   From home with mom   Anticipated DC Date:  01/11/2014   Anticipated DC Plan:  [place]  CM consult      Choice offered to / List presented to:             Status of service:  In process, will continue to follow Medicare Important Message given?   (If response is "NO", the following Medicare IM given date fields will be blank) Date Medicare IM given:   Medicare IM given by:   Date Additional Medicare IM given:   Additional Medicare IM given by:    Discharge Disposition:    Per UR Regulation:    If discussed at Long Length of Stay Meetings, dates discussed:    Comments:  01/07/14 Marney Doctor RN,BSN,NCM CM following for DC needs.

## 2014-01-07 NOTE — Progress Notes (Signed)
Patient was heard screaming shortly after arrival on the unit. When I entered patient's room noted blood and IV fluid over bed linen. Patient was shouting "they don't know what they are doing around here. I am bleeding. I have cancer" Patient was very hysterical. Mother was at bedside but was unable to calm pt. It was noted that the  IV line became disconnected from the hub. Patient continued screaming despite efforts to calm her. However, after several minutes she became calm as her bed linen was changed and her hands and bed rails were cleaned due to blood stains. Patient however remained verbally abusive and uncooperative during physical assessment. Refused to answer pertinent questions and request to examine her vaginal area which was emanating a very foul odor. Patient stated " why do you all ask so many questions?". Patient noted to exhibit very childlike behaviors and is overly dependent on her elderly mother for guidance. However, patient was able to name all medications she takes and what doses each was. Cognitively, unable to fully understand basic hospital procedures; has poor concept of time and situation. Patient has settled down and was instructed to call for assistance using the call light. Will monitor pt behavior throughout the night.

## 2014-01-08 DIAGNOSIS — F332 Major depressive disorder, recurrent severe without psychotic features: Secondary | ICD-10-CM

## 2014-01-08 LAB — CBC
HCT: 35.2 % — ABNORMAL LOW (ref 36.0–46.0)
Hemoglobin: 11.6 g/dL — ABNORMAL LOW (ref 12.0–15.0)
MCH: 29.2 pg (ref 26.0–34.0)
MCHC: 33 g/dL (ref 30.0–36.0)
MCV: 88.7 fL (ref 78.0–100.0)
Platelets: 166 10*3/uL (ref 150–400)
RBC: 3.97 MIL/uL (ref 3.87–5.11)
RDW: 12.4 % (ref 11.5–15.5)
WBC: 12.1 10*3/uL — AB (ref 4.0–10.5)

## 2014-01-08 LAB — URINE CULTURE
Colony Count: NO GROWTH
Culture: NO GROWTH

## 2014-01-08 LAB — BASIC METABOLIC PANEL
ANION GAP: 9 (ref 5–15)
BUN: 6 mg/dL (ref 6–23)
CHLORIDE: 102 meq/L (ref 96–112)
CO2: 28 mEq/L (ref 19–32)
Calcium: 8.5 mg/dL (ref 8.4–10.5)
Creatinine, Ser: 0.7 mg/dL (ref 0.50–1.10)
Glucose, Bld: 103 mg/dL — ABNORMAL HIGH (ref 70–99)
POTASSIUM: 4.6 meq/L (ref 3.7–5.3)
SODIUM: 139 meq/L (ref 137–147)

## 2014-01-08 MED ORDER — OXYCODONE HCL 5 MG PO TABS
10.0000 mg | ORAL_TABLET | ORAL | Status: DC | PRN
  Administered 2014-01-08 – 2014-01-13 (×18): 10 mg via ORAL
  Filled 2014-01-08 (×18): qty 2

## 2014-01-08 MED ORDER — ARIPIPRAZOLE 5 MG PO TABS
5.0000 mg | ORAL_TABLET | Freq: Two times a day (BID) | ORAL | Status: DC
  Administered 2014-01-10: 5 mg via ORAL
  Filled 2014-01-08 (×6): qty 1

## 2014-01-08 MED ORDER — HYDROMORPHONE HCL 1 MG/ML IJ SOLN
0.5000 mg | Freq: Once | INTRAMUSCULAR | Status: AC
  Administered 2014-01-08: 0.5 mg via INTRAVENOUS
  Filled 2014-01-08: qty 1

## 2014-01-08 MED ORDER — POLYETHYLENE GLYCOL 3350 17 G PO PACK
17.0000 g | PACK | Freq: Every day | ORAL | Status: DC
  Administered 2014-01-08 – 2014-01-10 (×3): 17 g via ORAL
  Filled 2014-01-08 (×8): qty 1

## 2014-01-08 NOTE — Progress Notes (Signed)
Clinical Social Work Department CLINICAL SOCIAL WORK PSYCHIATRY SERVICE LINE ASSESSMENT 01/08/2014  Patient:  [name]  Account:  [id number]  Admit Date:  01/06/2014  Clinical Social Worker:  Sindy Messing, LCSW  Date/Time:  01/08/2014 11:30 AM Referred by:  Physician  Date referred:  01/08/2014 Reason for Referral  Psychosocial assessment   Presenting Symptoms/Problems (In the person's/family's own words):   Psych consulted for depression and anxiety.   Abuse/Neglect/Trauma History (check all that apply)  Denies history   Abuse/Neglect/Trauma Comments:   Psychiatric History (check all that apply)  Outpatient treatment   Psychiatric medications:  Abilify 5 mg  Ativan 1 mg  Zoloft 200 mg   Current Mental Health Hospitalizations/Previous Mental Health History:   Patient reports she was diagnosed with depression and anxiety several years ago. Patient reports she is currently medication management on outpatient basis.   Current provider:   [employer] and Date:   [place], [place]   Current Medications:   Scheduled Meds:      . antiseptic oral rinse  7 mL Mouth Rinse q12n4p  . ARIPiprazole  5 mg Oral BID  . chlorhexidine  15 mL Mouth Rinse BID  . ciprofloxacin  400 mg Intravenous Q12H  . enoxaparin (LOVENOX) injection  40 mg Subcutaneous QHS  . levothyroxine  125 mcg Oral QAC breakfast  . LORazepam  1 mg Oral BID  . metronidazole  500 mg Intravenous Q8H  . polyethylene glycol  17 g Oral Daily  . sertraline  200 mg Oral QPM        Continuous Infusions:      . sodium chloride 75 mL/hr at 01/07/14 2250          PRN Meds:.acetaminophen, albuterol, HYDROmorphone (DILAUDID) injection, ondansetron (ZOFRAN) IV, ondansetron, oxyCODONE       Previous Impatient Admission/Date/Reason:   None reported   Emotional Health / Current Symptoms    Suicide/Self Harm  None reported   Suicide attempt in the past:   Patient reports she makes statements  about wanting to die when pain is uncontrolled but patient reports she has never tried to harm herself and does not mean these statements. Patient reports she feels overwhelmed at times but denies any SI or HI.   Other harmful behavior:   None reported   Psychotic/Dissociative Symptoms  None reported   Other Psychotic/Dissociative Symptoms:    Attention/Behavioral Symptoms  Inattentive   Other Attention / Behavioral Symptoms:   Patient has to be redirected throughout assessment.    Cognitive Impairment  Within Normal Limits   Other Cognitive Impairment:   Patient alert and oriented.    Mood and Adjustment  Labile    Stress, Anxiety, Trauma, Any Recent Loss/Stressor  Other - See comment   Anxiety (frequency):   Patient reports medication is effective for anxiety and no concerns with current treatment.   Phobia (specify):   N/A   Compulsive behavior (specify):   N/A   Obsessive behavior (specify):   N/A   Other:   Patient has cancer diagnosis and reports it has been difficult to adjust to illness.   Substance Abuse/Use  None   SBIRT completed (please refer for detailed history):  N  Self-reported substance use:   Patient denies SA.   Urinary Drug Screen Completed:  N Alcohol level:   N/A    Environmental/Housing/Living Arrangement  With Biological Parent(s)   Who is in the home:   Mom   Emergency contact:  Bertha-mom  Financial  Medicaid   Patient's Strengths and Goals (patient's own words):   Patient reports supportive mother.   Clinical Social Worker's Interpretive Summary:   CSW received referral in order to complete psychosocial assessment. CSW reviewed chat and met with patient at bedside with psych MD. CSW introduced myself and explained role.    Patient reports she lives at home with her mother and has been disabled for over 47 years. Patient plans to DC back home with mom and reports she is a strong support for her. Patient reports  that she is upset to be back in the hospital but needs her pain managed. Patient was diagnosed with cancer in 2013 and had surgery but discovered cancer had returned this year. Patient reports that she is hopeful to get better soon so that pain is managed.    Patient reports that she is compliant with medication for depression and anxiety and that her [place] concerns are not the problem. Patient denies any SI or HI or psychosis. Patient reports that she just wants the attending MD to help her with the cancer and pain and does not feel her depression needs to be addressed at this time.    Patient is tearful and easily irritated during assessment. Patient is alert and oriented and able to participate in her own assessment. Patient reports no concerns and she does not need CSW assistance at this time.   Disposition:  Recommend Psych CSW continuing to support while in hospital  [place], [place] [phone]

## 2014-01-08 NOTE — Consult Note (Signed)
[facility] Consult   Reason for Consult:  Depression and anxiett Referring Physician:  Dr. [name] is an 58 y.o. female. Total Time spent with patient: 45 minutes  Assessment: AXIS I:  Generalized Anxiety Disorder and Major Depression, Recurrent severe AXIS II:  Cluster B Traits AXIS III:   Past Medical History  Diagnosis Date  . Thyroid disease   . Arthritis   . Asthma   . Depression   . Fibromyalgia   . Lumbar spondylolysis   . Allergy   . Endometrial cancer   . Spinal stenosis   . Hypertension   . Dysrhythmia     OCCAS PAC'S AND PVC'S  PER HOLTER MONITOR STUDY --PT HAS OCCAS CHEST PAINS AND PALPITATIONS--HAD ECHO NORMAL LV SIZE AND FUNCTION.  Marland Kitchen Sleep apnea     HAS CPAP MACHINE BUT DOES NOT USE  . Anxiety     HAS OCD AND ON DISABILITY-PER [name]   . Back pain, chronic   . Obese    AXIS IV:  other psychosocial or environmental problems, problems related to social environment and problems with primary support group AXIS V:  41-50 serious symptoms  Plan: Patient has capacity to make her own medical decision and living arrangement as per the evaluation today. No evidence of imminent risk to self or others at present.   Patient does not meet criteria for psychiatric inpatient admission. Supportive therapy provided about ongoing stressors. Discussed crisis plan, support from social network, calling 911, coming to the Emergency Department, and calling Suicide Hotline. Recommend Abilify 5 mg PO BID and Discontinue Navane for mood stabilization and insomnia.  Appreciate psychiatric consultation and follow up as clinically required Please contact [phone] if needs further assistance  Subjective:   [name] is a 58 y.o. female patient admitted with depression and anxiety  HPI: Patient is seen, chart reviewed and case discussed with Sindy Messing, LCSW. Patient appeared lying in her bed and stated that she has been seeing a  psychiatrist at [employer] services" for depression and anxiety over five years and denied current symptoms of depression, anxiety and psychosis. She has denied suicidal or homicidal ideation, intention or plans. She has no evidence of psychosis. She has labile mood, tearful and mostly reports she needs pain medication with Dilaudid for her lower abdominal pain and wants to talk about her scans and medical investigation. She has been disabled and lives with her mother over several years. She gave consent to communicate with her mother. She has intact cognitions including orientation, concentration, language and memory. She knows she has been suffering with hypothyroidism, acute sigmoid diverticulitis, endometrial cancer with recent recurrence, depression, anxiety and spinal stenosis etc. Denied substance abuse and frustrated about pain during this evaluation.   Medical history: Patient is 58 yo female with hypothyroidism, endometrial cancer with recent recurrence, now presenting to [facility] ED with main concern of several days duration of progressively worsening generalized abd pain, constant and sharp, radiating to the lower abd quadrants L > R side, worse with eating and with no specific alleviating factors, no similar events in the past. Pt denies recent sick contacts or exposures, also denies chest pain or shortness of breath. She has had poor oral intake due to abd pain. In ED, pt noted to be in mild distress due to abd pain, VSS notable for T 99.4, O2 sat 92% on RA, WBC 12.3, Ct abd with acute sigmoid diverticulitis. Pt started on Cipro and Flagyl IV  and TRH asked to admit for further evaluation  HPI Elements:   Location:  depression secondary to [facility]. Quality:  sad, depressed, tearful and disturbed sleep. Severity:  unable to function at her home,staying in her bed. Timing:  increased abd pain due to cancer spread. Duration:  few weeks. Context:  complicated medical condition.  Past Psychiatric  History: Past Medical History  Diagnosis Date  . Thyroid disease   . Arthritis   . Asthma   . Depression   . Fibromyalgia   . Lumbar spondylolysis   . Allergy   . Endometrial cancer   . Spinal stenosis   . Hypertension   . Dysrhythmia     OCCAS PAC'S AND PVC'S  PER HOLTER MONITOR STUDY --PT HAS OCCAS CHEST PAINS AND PALPITATIONS--HAD ECHO NORMAL LV SIZE AND FUNCTION.  Marland Kitchen Sleep apnea     HAS CPAP MACHINE BUT DOES NOT USE  . Anxiety     HAS OCD AND ON DISABILITY-PER [name]   . Back pain, chronic   . Obese     reports that she has never smoked. She has never used smokeless tobacco. She reports that she does not drink alcohol or use illicit drugs. Family History  Problem Relation Age of Onset  . Hypertension Mother   . Coronary artery disease Father   . Cancer Brother     Renal cell carcinoma  . Hypertension Other   . Cancer Other   . Heart attack Father   . Coronary artery disease Brother      Living Arrangements: Parent (mom)   Abuse/Neglect [facility]) Physical Abuse: Denies Verbal Abuse: Denies Sexual Abuse: Denies Allergies:   Allergies  Allergen Reactions  . Sulfa Antibiotics Other (See Comments)    Unknown-pt states she is unsure of what her reaction is  . Sulfamethoxazole-Trimethoprim Other (See Comments)    Unknown reaction    ACT Assessment Complete:  NO Objective: Blood pressure 106/60, pulse 96, temperature 99.3 F (37.4 C), temperature source Oral, resp. rate 16, height 4' 11"  (1.499 m), weight 79.243 kg (174 lb 11.2 oz), SpO2 95.00%.Body mass index is 35.27 kg/(m^2). Results for orders placed during the hospital encounter of 01/06/14 (from the past 72 hour(s))  CBC WITH DIFFERENTIAL     Status: Abnormal   Collection Time    01/06/14  4:01 PM      Result Value Ref Range   WBC 12.3 (*) 4.0 - 10.5 K/uL   RBC 4.53  3.87 - 5.11 MIL/uL   Hemoglobin 13.9  12.0 - 15.0 g/dL   HCT 39.8  36.0 - 46.0 %   MCV 87.9  78.0 - 100.0 fL   MCH 30.7  26.0 - 34.0 pg    MCHC 34.9  30.0 - 36.0 g/dL   RDW 12.5  11.5 - 15.5 %   Platelets 193  150 - 400 K/uL   Neutrophils Relative % 72  43 - 77 %   Neutro Abs 8.9 (*) 1.7 - 7.7 K/uL   Lymphocytes Relative 17  12 - 46 %   Lymphs Abs 2.1  0.7 - 4.0 K/uL   Monocytes Relative 9  3 - 12 %   Monocytes Absolute 1.1 (*) 0.1 - 1.0 K/uL   Eosinophils Relative 2  0 - 5 %   Eosinophils Absolute 0.2  0.0 - 0.7 K/uL   Basophils Relative 0  0 - 1 %   Basophils Absolute 0.0  0.0 - 0.1 K/uL  COMPREHENSIVE METABOLIC PANEL  Status: Abnormal   Collection Time    01/06/14  4:01 PM      Result Value Ref Range   Sodium 138  137 - 147 mEq/L   Potassium 4.4  3.7 - 5.3 mEq/L   Chloride 100  96 - 112 mEq/L   CO2 24  19 - 32 mEq/L   Glucose, Bld 151 (*) 70 - 99 mg/dL   BUN 8  6 - 23 mg/dL   Creatinine, Ser 0.69  0.50 - 1.10 mg/dL   Calcium 9.2  8.4 - 10.5 mg/dL   Total Protein 7.7  6.0 - 8.3 g/dL   Albumin 3.5  3.5 - 5.2 g/dL   AST 20  0 - 37 U/L   ALT 14  0 - 35 U/L   Alkaline Phosphatase 58  39 - 117 U/L   Total Bilirubin 0.5  0.3 - 1.2 mg/dL   GFR calc non Af Amer >90  >90 mL/min   GFR calc Af Amer >90  >90 mL/min   Comment: (NOTE)     The eGFR has been calculated using the CKD EPI equation.     This calculation has not been validated in all clinical situations.     eGFR's persistently <90 mL/min signify possible Chronic Kidney     Disease.   Anion gap 14  5 - 15  URINALYSIS, ROUTINE W REFLEX MICROSCOPIC     Status: Abnormal   Collection Time    01/06/14  4:09 PM      Result Value Ref Range   Color, Urine YELLOW  YELLOW   APPearance CLEAR  CLEAR   Specific Gravity, Urine 1.010  1.005 - 1.030   pH 6.0  5.0 - 8.0   Glucose, UA NEGATIVE  NEGATIVE mg/dL   Hgb urine dipstick SMALL (*) NEGATIVE   Bilirubin Urine NEGATIVE  NEGATIVE   Ketones, ur NEGATIVE  NEGATIVE mg/dL   Protein, ur NEGATIVE  NEGATIVE mg/dL   Urobilinogen, UA 0.2  0.0 - 1.0 mg/dL   Nitrite NEGATIVE  NEGATIVE   Leukocytes, UA SMALL (*)  NEGATIVE  URINE MICROSCOPIC-ADD ON     Status: None   Collection Time    01/06/14  4:09 PM      Result Value Ref Range   Squamous Epithelial / LPF RARE  RARE   WBC, UA 7-10  <3 WBC/hpf   RBC / HPF 0-2  <3 RBC/hpf   Bacteria, UA RARE  RARE  URINE CULTURE     Status: None   Collection Time    01/07/14  2:04 AM      Result Value Ref Range   Specimen Description URINE, RANDOM     Special Requests NONE     Culture  Setup Time       Value: 01/07/2014 10:34     Performed at [facility]       Value: NO GROWTH     Performed at [facility]   Culture       Value: NO GROWTH     Performed at [facility]   Report Status 01/08/2014 FINAL    URINALYSIS, ROUTINE W REFLEX MICROSCOPIC     Status: Abnormal   Collection Time    01/07/14  2:05 AM      Result Value Ref Range   Color, Urine YELLOW  YELLOW   APPearance CLOUDY (*) CLEAR   Specific Gravity, Urine 1.016  1.005 - 1.030   pH 6.5  5.0 - 8.0   Glucose, UA NEGATIVE  NEGATIVE mg/dL   Hgb urine dipstick MODERATE (*) NEGATIVE   Bilirubin Urine NEGATIVE  NEGATIVE   Ketones, ur NEGATIVE  NEGATIVE mg/dL   Protein, ur NEGATIVE  NEGATIVE mg/dL   Urobilinogen, UA 1.0  0.0 - 1.0 mg/dL   Nitrite NEGATIVE  NEGATIVE   Leukocytes, UA MODERATE (*) NEGATIVE  URINE MICROSCOPIC-ADD ON     Status: None   Collection Time    01/07/14  2:05 AM      Result Value Ref Range   Squamous Epithelial / LPF RARE  RARE   WBC, UA 11-20  <3 WBC/hpf   Comment: WITH CLUMPS   RBC / HPF 3-6  <3 RBC/hpf   Bacteria, UA RARE  RARE  BASIC METABOLIC PANEL     Status: Abnormal   Collection Time    01/07/14  4:40 AM      Result Value Ref Range   Sodium 137  137 - 147 mEq/L   Potassium 4.0  3.7 - 5.3 mEq/L   Chloride 101  96 - 112 mEq/L   CO2 25  19 - 32 mEq/L   Glucose, Bld 104 (*) 70 - 99 mg/dL   BUN 6  6 - 23 mg/dL   Creatinine, Ser 0.67  0.50 - 1.10 mg/dL   Calcium 8.7  8.4 - 10.5 mg/dL   GFR calc non Af Amer >90  >90  mL/min   GFR calc Af Amer >90  >90 mL/min   Comment: (NOTE)     The eGFR has been calculated using the CKD EPI equation.     This calculation has not been validated in all clinical situations.     eGFR's persistently <90 mL/min signify possible Chronic Kidney     Disease.   Anion gap 11  5 - 15  CBC     Status: Abnormal   Collection Time    01/07/14  4:40 AM      Result Value Ref Range   WBC 11.0 (*) 4.0 - 10.5 K/uL   RBC 4.07  3.87 - 5.11 MIL/uL   Hemoglobin 12.0  12.0 - 15.0 g/dL   HCT 35.3 (*) 36.0 - 46.0 %   MCV 86.7  78.0 - 100.0 fL   MCH 29.5  26.0 - 34.0 pg   MCHC 34.0  30.0 - 36.0 g/dL   RDW 12.5  11.5 - 15.5 %   Platelets 180  150 - 400 K/uL  URINE RAPID DRUG SCREEN (HOSP PERFORMED)     Status: Abnormal   Collection Time    01/07/14 12:13 PM      Result Value Ref Range   Opiates POSITIVE (*) NONE DETECTED   Cocaine NONE DETECTED  NONE DETECTED   Benzodiazepines NONE DETECTED  NONE DETECTED   Amphetamines NONE DETECTED  NONE DETECTED   Tetrahydrocannabinol NONE DETECTED  NONE DETECTED   Barbiturates NONE DETECTED  NONE DETECTED   Comment:            DRUG SCREEN FOR MEDICAL PURPOSES     ONLY.  IF CONFIRMATION IS NEEDED     FOR ANY PURPOSE, NOTIFY LAB     WITHIN 5 DAYS.                LOWEST DETECTABLE LIMITS     FOR URINE DRUG SCREEN     Drug Class       Cutoff (ng/mL)     Amphetamine  1000     Barbiturate      200     Benzodiazepine   063     Tricyclics       016     Opiates          300     Cocaine          300     THC              50  TSH     Status: None   Collection Time    01/07/14  2:30 PM      Result Value Ref Range   TSH 1.410  0.350 - 4.500 uIU/mL   Comment: Performed at [facility]  CBC     Status: Abnormal   Collection Time    01/08/14  4:23 AM      Result Value Ref Range   WBC 12.1 (*) 4.0 - 10.5 K/uL   RBC 3.97  3.87 - 5.11 MIL/uL   Hemoglobin 11.6 (*) 12.0 - 15.0 g/dL   HCT 35.2 (*) 36.0 - 46.0 %   MCV 88.7  78.0 - 100.0  fL   MCH 29.2  26.0 - 34.0 pg   MCHC 33.0  30.0 - 36.0 g/dL   RDW 12.4  11.5 - 15.5 %   Platelets 166  150 - 400 K/uL  BASIC METABOLIC PANEL     Status: Abnormal   Collection Time    01/08/14  4:23 AM      Result Value Ref Range   Sodium 139  137 - 147 mEq/L   Potassium 4.6  3.7 - 5.3 mEq/L   Chloride 102  96 - 112 mEq/L   CO2 28  19 - 32 mEq/L   Glucose, Bld 103 (*) 70 - 99 mg/dL   BUN 6  6 - 23 mg/dL   Creatinine, Ser 0.70  0.50 - 1.10 mg/dL   Calcium 8.5  8.4 - 10.5 mg/dL   GFR calc non Af Amer >90  >90 mL/min   GFR calc Af Amer >90  >90 mL/min   Comment: (NOTE)     The eGFR has been calculated using the CKD EPI equation.     This calculation has not been validated in all clinical situations.     eGFR's persistently <90 mL/min signify possible Chronic Kidney     Disease.   Anion gap 9  5 - 15   Labs are reviewed.  Current Facility-Administered Medications  Medication Dose Route Frequency Provider Last Rate Last Dose  . 0.9 %  sodium chloride infusion   Intravenous Continuous Gardiner Barefoot, NP 75 mL/hr at 01/07/14 2250    . acetaminophen (TYLENOL) tablet 650 mg  650 mg Oral Q6H PRN Gardiner Barefoot, NP   650 mg at 01/07/14 2247  . albuterol (PROVENTIL) (2.5 MG/3ML) 0.083% nebulizer solution 2.5 mg  2.5 mg Inhalation Q4H PRN Theodis Blaze, MD      . antiseptic oral rinse (CPC / CETYLPYRIDINIUM CHLORIDE 0.05%) solution 7 mL  7 mL Mouth Rinse q12n4p Christina P Rama, MD      . chlorhexidine (PERIDEX) 0.12 % solution 15 mL  15 mL Mouth Rinse BID Venetia Maxon Rama, MD   15 mL at 01/08/14 0109  . ciprofloxacin (CIPRO) IVPB 400 mg  400 mg Intravenous Q12H Theodis Blaze, MD   400 mg at 01/08/14 1035  . enoxaparin (LOVENOX) injection 40 mg  40 mg Subcutaneous QHS Iskra M  Doyle Askew, MD   40 mg at 01/07/14 2102  . HYDROmorphone (DILAUDID) injection 1-1.5 mg  1-1.5 mg Intravenous Q4H PRN Shanda Howells, MD   1.5 mg at 01/08/14 0704  . levothyroxine (SYNTHROID, LEVOTHROID) tablet 125  mcg  125 mcg Oral QAC breakfast Theodis Blaze, MD   125 mcg at 01/08/14 2060  . LORazepam (ATIVAN) tablet 1 mg  1 mg Oral BID Theodis Blaze, MD   1 mg at 01/08/14 1035  . metroNIDAZOLE (FLAGYL) IVPB 500 mg  500 mg Intravenous Q8H Theodis Blaze, MD   500 mg at 01/08/14 1561  . ondansetron (ZOFRAN) tablet 4 mg  4 mg Oral Q6H PRN Theodis Blaze, MD       Or  . ondansetron [facility]) injection 4 mg  4 mg Intravenous Q6H PRN Theodis Blaze, MD   4 mg at 01/07/14 2247  . sertraline (ZOLOFT) tablet 200 mg  200 mg Oral QPM Theodis Blaze, MD   200 mg at 01/07/14 1800  . thiothixene (NAVANE) capsule 2 mg  2 mg Oral QHS Theodis Blaze, MD   2 mg at 01/07/14 2101    Psychiatric Specialty Exam: Physical Exam as per history and physical  Review of Systems  Constitutional: Positive for malaise/fatigue.  Genitourinary: Positive for frequency.  Musculoskeletal: Positive for back pain and myalgias.  Neurological: Positive for dizziness, weakness and headaches.  Psychiatric/Behavioral: Positive for depression. The patient is nervous/anxious and has insomnia.     Blood pressure 106/60, pulse 96, temperature 99.3 F (37.4 C), temperature source Oral, resp. rate 16, height 4' 11"  (1.499 m), weight 79.243 kg (174 lb 11.2 oz), SpO2 95.00%.Body mass index is 35.27 kg/(m^2).  General Appearance: Guarded  Eye Contact::  Fair  Speech:  Clear and Coherent and Slow  Volume:  Decreased  Mood:  Anxious, Depressed, Dysphoric and Hopeless  Affect:  Depressed, Labile and Tearful  Thought Process:  Coherent and Tangential  Orientation:  Full (Time, Place, and Person)  Thought Content:  WDL  Suicidal Thoughts:  No  Homicidal Thoughts:  No  Memory:  Immediate;   Good Recent;   Good  Judgement:  Fair  Insight:  Fair  Psychomotor Activity:  Decreased  Concentration:  Fair  Recall:  Good  Fund of Knowledge:Good  Language: Good  Akathisia:  NA  Handed:  Right  AIMS (if indicated):     Assets:  Communication  Skills Desire for Improvement Financial Resources/Insurance Housing Intimacy Leisure Time Resilience Social Support  Sleep:      Musculoskeletal: Strength & Muscle Tone: decreased Gait & Station: unable to stand Patient leans: N/A  Treatment Plan Summary: Daily contact with patient to assess and evaluate symptoms and progress in treatment Medication management Discontinue Navane and start Abilify 5 mg PO BID for depression and mood stabilization and continue Zoloft and Ativan for depression and anxiety  Samarie Pinder,JANARDHAHA R. 01/08/2014 10:42 AM

## 2014-01-08 NOTE — Progress Notes (Signed)
Progress Note   Jodiann Ognibene Mizer WIO:[id number] DOB: [birth year] DOA: 01/06/2014 PCP: [name], FNP   Brief Narrative:   LIBERTY SETO is an 58 y.o. 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 She subsequently was scheduled to see Dr. Marko Plume on 12/28/13 but did not keep appointment. She presented to the ER 01/06/14 with a chief complaint of abdominal pain and was found to have acute sigmoid diverticulitis on CT.  Assessment/Plan:   Principal Problem:   Diverticulitis  Continue Cipro/Flagyl.  Continue IV fluids and supportive care with analgesics, pain medication.  Still spiking fever.  Active Problems:   Pain medication seeking behavior  Insists percocet does not help her pain, only IV dilaudid.  When told she could not be discharged on dilaudid, wanted me to prescribe 60 mg of oxycodone at a time.  Told her we would have to slowly titrate up her pain medications.  No history of drug abuse reported, but pain previously managed at a pain clinic.    Add 10 mg oxycodone Q 4 hours, patient instructed to alternate with IV dilaudid, titrate oxycodone up and dilaudid down to ascertain effective home regimen.  Will need to follow up with her pain management MD at discharge.      Behavior disturbance  Patient has a history of possible polysubstance abuse, is on psychotropic medication, and has had behavioral outbursts on the nursing unit.  Psychiatry consultation requested.    Hypothyroidism  Continue Synthroid.  TSH WNL at 1.410.    Endometrial ca  Outpatient followup with  Dr. Marko Plume for further evaluation/treatment recommendations.  No role for inpatient chemotherapy well the patient is undergoing treatment for an active infection.    Depression / Anxiety state  Continue Zoloft, and when necessary Ativan.  Evaluated by psychiatrist 01/08/14.  Has capacity.  Navane changed to Abilify by psychiatrist.    DVT Prophylaxis   Continue Lovenox.  Code Status: Full. Family Communication: Mother updated at the bedside 01/07/14. Disposition Plan: Home when stable.   IV Access:    Peripheral IV   Procedures and diagnostic studies:   Ct Abdomen Pelvis W Contrast 01/06/2014: Slightly larger pelvic mass and relatively stable pelvic adenopathy.  Acute sigmoid diverticulitis without complicating features.      Medical Consultants:    Psychiatry, Durward Parcel, MD   Other Consultants:    None.   Anti-Infectives:    Cipro 01/06/14--->  Flagyl 01/06/14--->  Subjective:   Emeline General Gist continues to complain of 100/10 vaginal/abdominal pain.  Says the pain is a "burning pain" in her vagina.  She also has right hip pain "it feels like someone is boring a hole in it".  Some nausea but no vomiting.  LBM was 3 days ago.  Very insistent that dilaudid is the only thing that alleviates her vaginal pain.    Objective:    Filed Vitals:   01/07/14 0553 01/07/14 1400 01/07/14 2243 01/08/14 0500  BP: 91/62 126/69 111/68 106/60  Pulse: 89 101 105 96  Temp: 99.7 F (37.6 C) 100 F (37.8 C) 101.2 F (38.4 C) 99.3 F (37.4 C)  TempSrc:  Oral Oral Oral Oral  Resp: 16 18 16 16   Height:      Weight:      SpO2: 93% 93% 93% 95%    Intake/Output Summary (Last 24 hours) at 01/08/14 0757 Last data filed at 01/08/14 0500  Gross per 24 hour  Intake    240 ml  Output   1325 ml  Net  -1085 ml    Exam: Gen:  Mood labile, yelling out at nursing staff earlier today Cardiovascular:  RRR, No M/R/G Respiratory:  Lungs  CTAB Gastrointestinal:  Abdomen soft, lower abdominal tenderness, + BS Gentitourinary: Refused vaginal exam, but foul odor noted.  Vulva with signs of atrophy. Extremities:  No C/E/C   Data Reviewed:    Labs: Basic Metabolic Panel:  Recent Labs Lab 01/06/14 1601 01/07/14 0440 01/08/14 0423  NA 138 137 139  K 4.4 4.0 4.6  CL 100 101 102  CO2 24 25 28   GLUCOSE 151* 104* 103*  BUN 8 6 6   CREATININE 0.69 0.67 0.70  CALCIUM 9.2 8.7 8.5   GFR Estimated Creatinine Clearance: 68.9 ml/min (by C-G formula based on Cr of 0.7). Liver Function Tests:  Recent Labs Lab 01/06/14 1601  AST 20  ALT 14  ALKPHOS 58  BILITOT 0.5  PROT 7.7  ALBUMIN 3.5   CBC:  Recent Labs Lab 01/06/14 1601 01/07/14 0440 01/08/14 0423  WBC 12.3* 11.0* 12.1*  NEUTROABS 8.9*  --   --   HGB 13.9 12.0 11.6*  HCT 39.8 35.3* 35.2*  MCV 87.9 86.7 88.7  PLT 193 180 166   Microbiology Recent Results (from the past 240 hour(s))  URINE CULTURE     Status: None   Collection Time    01/07/14  2:04 AM      Result Value Ref Range Status   Specimen Description URINE, RANDOM   Final   Special Requests NONE   Final   Culture  Setup Time     Final   Value: 01/07/2014 10:34     Performed at [place]     Final   Value: NO GROWTH     Performed at [facility]   Culture     Final   Value: NO GROWTH     Performed at [facility]   Report Status 01/08/2014 FINAL   Final     Medications:   . antiseptic oral rinse  7 mL Mouth Rinse q12n4p  . chlorhexidine  15 mL Mouth Rinse BID  . ciprofloxacin  400 mg Intravenous Q12H  . enoxaparin (LOVENOX) injection  40 mg Subcutaneous QHS  . levothyroxine  125 mcg Oral QAC breakfast  . LORazepam  1 mg Oral BID  . metronidazole  500 mg Intravenous Q8H  . sertraline  200 mg Oral QPM  . thiothixene  2 mg Oral QHS   Continuous Infusions: . sodium chloride 75 mL/hr at 01/07/14 2250    Time spent: 35 minutes with > 50%  of time discussing current diagnostic test results, clinical impression and plan of care, coordination of care with psychiatry and nursing staff.    LOS: 2 days   RAMA,CHRISTINA  [facility] Pager [phone]. If unable to reach me by pager, please call my cell phone at [phone].  *Please refer to [email or web address], password [email or web address] to get updated schedule on who will round on this patient, as hospitalists switch teams weekly. If 7PM-7AM, please contact night-coverage at [email or web address], password [email or web address] for any overnight needs.  01/08/2014, 7:57 AM

## 2014-01-08 NOTE — Progress Notes (Signed)
Nutrition Brief Note  Patient identified on the Malnutrition Screening Tool (MST) Report  Wt Readings from Last 15 Encounters:  01/06/14 174 lb 11.2 oz (79.243 kg)  12/12/13 178 lb (80.74 kg)  02/28/13 182 lb (82.555 kg)  12/07/12 183 lb (83.008 kg)  12/08/12 183 lb (83.008 kg)  08/31/12 190 lb (86.183 kg)  11/25/11 199 lb 15.3 oz (90.7 kg)  11/23/11 198 lb (89.812 kg)  11/23/11 198 lb (89.812 kg)  11/19/11 198 lb (89.812 kg)  11/17/11 201 lb 8 oz (91.4 kg)  10/14/11 203 lb 14.4 oz (92.488 kg)  09/20/11 201 lb (91.173 kg)  09/09/11 190 lb (86.183 kg)  07/26/11 198 lb (89.812 kg)    Body mass index is 35.27 kg/(m^2). Patient meets criteria for obesity based on current BMI.   Current diet order is regular, patient is consuming approximately 100% of meals at this time. Labs and medications reviewed.   No nutrition interventions warranted at this time. If nutrition issues arise, please consult RD.   Laurette Schimke RD, LDN

## 2014-01-08 NOTE — Progress Notes (Signed)
Patient continued to display odd behaviors throughout the night.  She was emotional, tearful/crying at times, and very agitated/easily annoyed at others.  This RN did witness her screaming at her mother over the phone.  She continued to report significant pain despite pain medications being given as soon as patient could get them.  Patient refused several things regarding care as well.  Patient refused a bath(asked numerous times by this RN and tech) and also continues to refuse perineal assessment.  Patient this morning became very agitated when she believed she did not get her IV medication because she did not "taste" the medication.  2 RN's assessed that site was working well with great blood return.  Paged IV team to start new IV per patient request anyway.  Will continue to monitor patient. Azzie Glatter [place]

## 2014-01-09 ENCOUNTER — Inpatient Hospital Stay (HOSPITAL_COMMUNITY): Payer: Medicaid Other

## 2014-01-09 DIAGNOSIS — F3130 Bipolar disorder, current episode depressed, mild or moderate severity, unspecified: Secondary | ICD-10-CM

## 2014-01-09 LAB — URINE MICROSCOPIC-ADD ON

## 2014-01-09 LAB — URINALYSIS, ROUTINE W REFLEX MICROSCOPIC
Bilirubin Urine: NEGATIVE
Glucose, UA: NEGATIVE mg/dL
Ketones, ur: NEGATIVE mg/dL
NITRITE: NEGATIVE
PROTEIN: 30 mg/dL — AB
Specific Gravity, Urine: 1.016 (ref 1.005–1.030)
Urobilinogen, UA: 1 mg/dL (ref 0.0–1.0)
pH: 5.5 (ref 5.0–8.0)

## 2014-01-09 MED ORDER — SODIUM CHLORIDE 0.9 % IJ SOLN
3.0000 mL | Freq: Two times a day (BID) | INTRAMUSCULAR | Status: DC
  Administered 2014-01-09 – 2014-01-15 (×10): 3 mL via INTRAVENOUS

## 2014-01-09 NOTE — Progress Notes (Signed)
Clinical Social Work  CSW met with patient and psych MD at bedside. Patient more pleasant today and engaged in assessment. Patient reports medication is effective and she has been able to sleep better. Patient spoke about her relationships with family and that her mother is 59 years old but very supportive. Patient does not have a close relationship with her sister but strong relationship with brother. Patient reports no SI or HI and thanked team for visit.  [place], [place] [phone]

## 2014-01-09 NOTE — Progress Notes (Signed)
[facility] MD Progress Note  01/09/2014 1:19 PM [name]  MRN:  [id number]  Subjective:  Patient is seen with Sindy Messing, LCSW for psych consult follow up. Patient appeared lying in her bed, much calmer, awake, alert, oriented and cooperative with the evaluation. She has been complaint with her psych medcations and no adverse effects. She slept better and her appetite is still poor. She is happy to talk to Dr. Charlies Silvers and has plan of pain management.   She has been seeing a psychiatrist at [facility]" for depression and anxiety over five years. She has denied suicidal or homicidal ideation, intention or plans. She has no evidence of psychosis. She has anxious mood, and reports she needs pain medication. She has been disabled and lives with her mother over several years. She has been talking with her mother, brother, friend and BF from time to time and unhappy that her sister is busy with her grand children and not supportive to her. She also states that she does not hold grudges.   Diagnosis:   DSM5: Schizophrenia Disorders:   Obsessive-Compulsive Disorders:   Trauma-Stressor Disorders:   Substance/Addictive Disorders:   Depressive Disorders:  Major Depressive Disorder - Severe (296.23) Total Time spent with patient: 30 minutes  Axis I: Bipolar, Depressed  ADL's:  Impaired  Sleep: Good  Appetite:  Poor  Suicidal Ideation:  Denied active suicidal ideation and states that she feels like she is going to die because of medical problems Homicidal Ideation:  denied AEB (as evidenced by):  Psychiatric Specialty Exam: Physical Exam  ROS  Blood pressure 111/77, pulse 95, temperature 98.9 F (37.2 C), temperature source Oral, resp. rate 16, height 4' 11" (1.499 m), weight 79.243 kg (174 lb 11.2 oz), SpO2 94.00%.Body mass index is 35.27 kg/(m^2).  General Appearance: Guarded  Eye Contact::  Good  Speech:  Clear and Coherent and Slow  Volume:  Normal  Mood:  Anxious  and Depressed  Affect:  Congruent and Depressed  Thought Process:  Coherent and Goal Directed  Orientation:  Full (Time, Place, and Person)  Thought Content:  WDL  Suicidal Thoughts:  No  Homicidal Thoughts:  No  Memory:  Immediate;   Good Recent;   Good  Judgement:  Intact  Insight:  Fair  Psychomotor Activity:  Decreased  Concentration:  Good  Recall:  Good  Fund of Knowledge:Fair  Language: Good  Akathisia:  No  Handed:  Right  AIMS (if indicated):     Assets:  Communication Skills Desire for Improvement Financial Resources/Insurance Housing Intimacy Leisure Time Resilience Social Support  Sleep:      Musculoskeletal: Strength & Muscle Tone: decreased Gait & Station: unable to stand Patient leans: N/A  Current Medications: Current Facility-Administered Medications  Medication Dose Route Frequency Provider Last Rate Last Dose  . acetaminophen (TYLENOL) tablet 650 mg  650 mg Oral Q6H PRN Gardiner Barefoot, NP   650 mg at 01/07/14 2247  . albuterol (PROVENTIL) (2.5 MG/3ML) 0.083% nebulizer solution 2.5 mg  2.5 mg Inhalation Q4H PRN Theodis Blaze, MD      . antiseptic oral rinse (CPC / CETYLPYRIDINIUM CHLORIDE 0.05%) solution 7 mL  7 mL Mouth Rinse q12n4p Christina P Rama, MD      . ARIPiprazole (ABILIFY) tablet 5 mg  5 mg Oral BID Durward Parcel, MD      . chlorhexidine (PERIDEX) 0.12 % solution 15 mL  15 mL Mouth Rinse BID Venetia Maxon Rama, MD   15 mL  at 01/08/14 0822  . ciprofloxacin (CIPRO) IVPB 400 mg  400 mg Intravenous Q12H Theodis Blaze, MD   400 mg at 01/09/14 0932  . enoxaparin (LOVENOX) injection 40 mg  40 mg Subcutaneous QHS Theodis Blaze, MD   40 mg at 01/07/14 2102  . HYDROmorphone (DILAUDID) injection 1-1.5 mg  1-1.5 mg Intravenous Q4H PRN Shanda Howells, MD   1.5 mg at 01/09/14 1142  . levothyroxine (SYNTHROID, LEVOTHROID) tablet 125 mcg  125 mcg Oral QAC breakfast Theodis Blaze, MD   125 mcg at 01/09/14 7106  . LORazepam (ATIVAN) tablet 1 mg   1 mg Oral BID Theodis Blaze, MD   1 mg at 01/09/14 0929  . metroNIDAZOLE (FLAGYL) IVPB 500 mg  500 mg Intravenous Q8H Theodis Blaze, MD   500 mg at 01/09/14 2694  . ondansetron (ZOFRAN) tablet 4 mg  4 mg Oral Q6H PRN Theodis Blaze, MD       Or  . ondansetron [facility]) injection 4 mg  4 mg Intravenous Q6H PRN Theodis Blaze, MD   4 mg at 01/08/14 2330  . oxyCODONE (Oxy IR/ROXICODONE) immediate release tablet 10 mg  10 mg Oral Q4H PRN Venetia Maxon Rama, MD   10 mg at 01/09/14 0929  . polyethylene glycol (MIRALAX / GLYCOLAX) packet 17 g  17 g Oral Daily Venetia Maxon Rama, MD   17 g at 01/09/14 0929  . sertraline (ZOLOFT) tablet 200 mg  200 mg Oral QPM Theodis Blaze, MD   200 mg at 01/08/14 1741    Lab Results:  Results for orders placed during the hospital encounter of 01/06/14 (from the past 48 hour(s))  TSH     Status: None   Collection Time    01/07/14  2:30 PM      Result Value Ref Range   TSH 1.410  0.350 - 4.500 uIU/mL   Comment: Performed at [facility]  CBC     Status: Abnormal   Collection Time    01/08/14  4:23 AM      Result Value Ref Range   WBC 12.1 (*) 4.0 - 10.5 K/uL   RBC 3.97  3.87 - 5.11 MIL/uL   Hemoglobin 11.6 (*) 12.0 - 15.0 g/dL   HCT 35.2 (*) 36.0 - 46.0 %   MCV 88.7  78.0 - 100.0 fL   MCH 29.2  26.0 - 34.0 pg   MCHC 33.0  30.0 - 36.0 g/dL   RDW 12.4  11.5 - 15.5 %   Platelets 166  150 - 400 K/uL  BASIC METABOLIC PANEL     Status: Abnormal   Collection Time    01/08/14  4:23 AM      Result Value Ref Range   Sodium 139  137 - 147 mEq/L   Potassium 4.6  3.7 - 5.3 mEq/L   Chloride 102  96 - 112 mEq/L   CO2 28  19 - 32 mEq/L   Glucose, Bld 103 (*) 70 - 99 mg/dL   BUN 6  6 - 23 mg/dL   Creatinine, Ser 0.70  0.50 - 1.10 mg/dL   Calcium 8.5  8.4 - 10.5 mg/dL   GFR calc non Af Amer >90  >90 mL/min   GFR calc Af Amer >90  >90 mL/min   Comment: (NOTE)     The eGFR has been calculated using the CKD EPI equation.     This calculation has not been validated  in all clinical  situations.     eGFR's persistently <90 mL/min signify possible Chronic Kidney     Disease.   Anion gap 9  5 - 15    Physical Findings: AIMS:  , ,  ,  ,    CIWA:    COWS:     Treatment Plan Summary: Daily contact with patient to assess and evaluate symptoms and progress in treatment Medication management  Plan: Abilify 5 mg twice daily for mood swings and Zoloft 200 mg for depression May refer to out patient psych treatment when medically stable Will follow up as clinically required while in hospital.  Medical Decision Making Problem Points:  Established problem, worsening (2), New problem, with no additional work-up planned (3), Review of last therapy session (1), Review of psycho-social stressors (1) and Self-limited or minor (1) Data Points:  Review or order clinical lab tests (1) Review or order medicine tests (1) Review of medication regiment & side effects (2) Review of new medications or change in dosage (2)  I certify that inpatient services furnished can reasonably be expected to improve the patient's condition.   ,JANARDHAHA R. 01/09/2014, 1:19 PM

## 2014-01-09 NOTE — Progress Notes (Signed)
Patient ID: [name], female   DOB: 1956-01-20, 58 y.o.   MRN: [id number]  [facility] PROGRESS NOTE  Lylian Sanagustin Eaddy BCW:[id number] DOB: 04-Nov-1955 DOA: 01/06/2014 PCP: [name], FNP  Brief Narrative:   58 y.o. 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 She subsequently was scheduled to see Dr. Marko Plume on 12/28/13 but did not keep appointment. She presented to the ER 01/06/14 with a chief complaint of abdominal pain and was found to have acute sigmoid diverticulitis on CT.   Assessment/Plan:   Principal Problem:  Diverticulitis  Continue Cipro/Flagyl.  Continue IV fluids and supportive care with analgesics, pain medication.  Still spiking fever, will proceed with UA, urine culture and CXR to ensure no other source  Active Problems:  FUO  Unclear etiology, diverticulitis is being treated so should not cause persistent fevers  Tmax 100.22F, WBC trending up 11 --> 12.1  CXR, UA, urine cultures, blood cx requested as noted above  Pain medication seeking behavior  Insists percocet does not help her pain, only IV dilaudid. When told she could not be discharged on dilaudid, wanted me to prescribe 60 mg of oxycodone at a time. Told her we would have to slowly titrate up her pain medications.  No history of drug abuse reported, but pain previously managed at a pain clinic.  Continue 10 mg oxycodone Q 4 hours, patient instructed to alternate with IV dilaudid, titrate oxycodone up and dilaudid down to ascertain effective home regimen. Will need to follow up with her pain management MD at  discharge.  Behavior disturbance  Patient has a history of possible polysubstance abuse, is on psychotropic medication, and has had behavioral outbursts on the nursing unit.  Psychiatry consultation requested, assistance is appreciated. Hypothyroidism  Continue Synthroid.  TSH WNL at 1.410. Endometrial ca  Outpatient followup with Dr. Marko Plume for further evaluation/treatment recommendations.  No role for inpatient chemotherapy well the patient is undergoing treatment for an active infection. Depression / Anxiety state  Continue Zoloft, and when necessary Ativan.  Evaluated by psychiatrist 01/08/14. Has capacity. Navane changed to Abilify by psychiatrist. DVT Prophylaxis  Continue Lovenox.  Code Status: Full.  Family Communication: Mother updated at the bedside 01/07/14.  Disposition Plan: Home when stable.   IV Access:   Peripheral IV Procedures and diagnostic studies:   Ct Abdomen Pelvis W Contrast 01/06/2014: Slightly larger pelvic mass and relatively stable pelvic adenopathy. Acute sigmoid diverticulitis without complicating features.  Medical Consultants:   Psychiatry, Durward Parcel, MD Other Consultants:   None. Anti-Infectives:   Cipro 01/06/14--->  Flagyl 01/06/14--->   Leisa Lenz, MD  [facility] Pager [phone]  If 7PM-7AM, please contact night-coverage [email or web address] Password [email or web address] 01/09/2014, 10:41 AM   LOS: 3 days    HPI/Subjective: No acute overnight events.  Objective: Filed Vitals:   01/08/14 0500 01/08/14 1347 01/08/14 2130 01/09/14 0509  BP: 106/60 102/59 105/61 111/77  Pulse: 96 91 95 95  Temp: 99.3 F (37.4 C) 98.6 F (37 C) 100.9 F (38.3 C) 98.9 F (37.2 C)  TempSrc: Oral Oral Oral Oral  Resp: 16 18 16 16   Height:  Weight:      SpO2: 95% 98% 100% 94%    Intake/Output Summary (Last 24 hours) at 01/09/14 1041 Last data filed at 01/09/14 0511  Gross per 24 hour  Intake    240 ml  Output    650 ml  Net   -410  ml    Exam:   General:  Pt is alert, follows commands appropriately, not in acute distress  Cardiovascular: Regular rate and rhythm, S1/S2, no murmurs  Respiratory: Clear to auscultation bilaterally, no wheezing, no crackles, no rhonchi  Abdomen: Soft, non tender, non distended, bowel sounds present  Extremities: No edema, pulses DP and PT palpable bilaterally  Neuro: Grossly nonfocal  Data Reviewed: Basic Metabolic Panel:  Recent Labs Lab 01/06/14 1601 01/07/14 0440 01/08/14 0423  NA 138 137 139  K 4.4 4.0 4.6  CL 100 101 102  CO2 24 25 28   GLUCOSE 151* 104* 103*  BUN 8 6 6   CREATININE 0.69 0.67 0.70  CALCIUM 9.2 8.7 8.5   Liver Function Tests:  Recent Labs Lab 01/06/14 1601  AST 20  ALT 14  ALKPHOS 58  BILITOT 0.5  PROT 7.7  ALBUMIN 3.5   CBC:  Recent Labs Lab 01/06/14 1601 01/07/14 0440 01/08/14 0423  WBC 12.3* 11.0* 12.1*  NEUTROABS 8.9*  --   --   HGB 13.9 12.0 11.6*  HCT 39.8 35.3* 35.2*  MCV 87.9 86.7 88.7  PLT 193 180 166    Recent Results (from the past 240 hour(s))  URINE CULTURE     Status: None   Collection Time    01/07/14  2:04 AM      Result Value Ref Range Status   Specimen Description URINE, RANDOM   Final   Special Requests NONE   Final   Culture  Setup Time     Final   Value: 01/07/2014 10:34     Performed at [place]     Final   Value: NO GROWTH     Performed at [facility]   Culture     Final   Value: NO GROWTH     Performed at [facility]   Report Status 01/08/2014 FINAL   Final     Scheduled Meds: . antiseptic oral rinse  7 mL Mouth Rinse q12n4p  . ARIPiprazole  5 mg Oral BID  . chlorhexidine  15 mL Mouth Rinse BID  . ciprofloxacin  400 mg Intravenous Q12H  . enoxaparin (LOVENOX) injection  40 mg Subcutaneous QHS  . levothyroxine  125 mcg Oral QAC breakfast  . LORazepam  1 mg Oral BID  . metronidazole  500 mg Intravenous Q8H  . polyethylene glycol  17 g Oral  Daily  . sertraline  200 mg Oral QPM   Continuous Infusions: . sodium chloride 75 mL/hr at 01/07/14 2250

## 2014-01-09 NOTE — Progress Notes (Signed)
Please note, patient is refusing to take Abilify that was prescribed by psychiatry.  Zandra Abts [facility]  01/09/2014  6:47 PM

## 2014-01-10 DIAGNOSIS — G8929 Other chronic pain: Secondary | ICD-10-CM

## 2014-01-10 DIAGNOSIS — E669 Obesity, unspecified: Secondary | ICD-10-CM

## 2014-01-10 DIAGNOSIS — F328 Other depressive episodes: Secondary | ICD-10-CM

## 2014-01-10 DIAGNOSIS — K5792 Diverticulitis of intestine, part unspecified, without perforation or abscess without bleeding: Secondary | ICD-10-CM

## 2014-01-10 LAB — CBC
HCT: 31.5 % — ABNORMAL LOW (ref 36.0–46.0)
Hemoglobin: 10.5 g/dL — ABNORMAL LOW (ref 12.0–15.0)
MCH: 29.2 pg (ref 26.0–34.0)
MCHC: 33.3 g/dL (ref 30.0–36.0)
MCV: 87.7 fL (ref 78.0–100.0)
Platelets: 222 10*3/uL (ref 150–400)
RBC: 3.59 MIL/uL — ABNORMAL LOW (ref 3.87–5.11)
RDW: 12.5 % (ref 11.5–15.5)
WBC: 9.5 10*3/uL (ref 4.0–10.5)

## 2014-01-10 LAB — BASIC METABOLIC PANEL
ANION GAP: 12 (ref 5–15)
BUN: 7 mg/dL (ref 6–23)
CO2: 27 mEq/L (ref 19–32)
Calcium: 8.2 mg/dL — ABNORMAL LOW (ref 8.4–10.5)
Chloride: 102 mEq/L (ref 96–112)
Creatinine, Ser: 0.6 mg/dL (ref 0.50–1.10)
Glucose, Bld: 91 mg/dL (ref 70–99)
Potassium: 3.8 mEq/L (ref 3.7–5.3)
Sodium: 141 mEq/L (ref 137–147)

## 2014-01-10 MED ORDER — HYDROMORPHONE HCL 2 MG/ML IJ SOLN
2.0000 mg | INTRAMUSCULAR | Status: DC | PRN
  Administered 2014-01-10 – 2014-01-13 (×15): 2 mg via INTRAVENOUS
  Filled 2014-01-10 (×15): qty 1

## 2014-01-10 MED ORDER — ARIPIPRAZOLE 10 MG PO TABS
10.0000 mg | ORAL_TABLET | Freq: Two times a day (BID) | ORAL | Status: DC
  Filled 2014-01-10 (×12): qty 1

## 2014-01-10 NOTE — Progress Notes (Addendum)
Patient ID: [name], female   DOB: 11-Jul-1955, 58 y.o.   MRN: [id number] [facility] PROGRESS NOTE  [name] BMW:[id number] DOB: June 30, 1955 DOA: 01/06/2014 PCP: [name], FNP  Brief narrative: 58 y.o. female with a PMH of substance abuse managed at the [facility] pain clinic, hypothyroidism, endometrial cancer status post hysterectomy 10/2011 who did not keep any of her followup appointments, and who was found to have a vaginal cuff recurrence after she subsequently was evaluated for abdominal pain in the ER 11/22/13, status post CT scan showing a pelvic mass, followed by vaginal biopsy done 12/12/13 which showed invasive adenocarcinoma. She subsequently was scheduled to see Dr. Marko Plume on 12/28/13 but did not keep appointment. She presented to the ER 01/06/14 with a chief complaint of abdominal pain and was found to have acute sigmoid diverticulitis on CT.   Assessment/Plan:   Principal Problem:  Acute diverticulitis Continue Cipro/Flagyl.  Continue IV fluids and supportive care with analgesics, pain medication.  Still spiking fever but improving, Tmax 99.6 F, Active Problems:  Fever   Unclear etiology, diverticulitis is being treated so should not cause persistent fevers   Tmax 99.6 F, WBC trending up 11 --> 12.1 but still pending this AM  CXR unremarkable for acute infectious process, pt denies urinary symptoms  Pain medication seeking behavior  Insists percocet does not help her pain, only IV dilaudid. When told she could not be discharged on dilaudid, wanted me to prescribe 60 mg of oxycodone at a time. Told her we would have to slowly titrate up her pain medications.  Continue 10 mg oxycodone Q 4 hours, patient instructed to alternate with IV dilaudid, titrate oxycodone up and dilaudid down to ascertain effective home regimen. Will need to follow up with her pain management MD at discharge.  Behavior disturbance  Patient has a history of possible  polysubstance abuse, is on psychotropic medication, and has had behavioral outbursts on the nursing unit.  Psychiatry consultation requested, assistance is appreciated. Hypothyroidism  Continue Synthroid.  TSH WNL at 1.410. Endometrial ca  Outpatient followup with Dr. Marko Plume for further evaluation/treatment recommendations.  Depression / Anxiety state  Continue Zoloft, and when necessary Ativan.  Evaluated by psychiatrist 01/08/14. Has capacity. Navane changed to Abilify by psychiatrist. DVT Prophylaxis  Continue Lovenox.  Code Status: Full.  Family Communication: Mother updated at the bedside 01/07/14.  Disposition Plan: Home when stable.   IV Access:   Peripheral IV Procedures and diagnostic studies:   Ct Abdomen Pelvis W Contrast 01/06/2014: Slightly larger pelvic mass and relatively stable pelvic adenopathy. Acute sigmoid diverticulitis without complicating features.  Medical Consultants:   Psychiatry, Durward Parcel, MD Other Consultants:   None. Anti-Infectives:   Cipro 01/06/14--->  Flagyl 01/06/14--->   Leisa Lenz, MD  [facility] Pager [phone]  If 7PM-7AM, please contact night-coverage [email or web address] Password [email or web address] 01/10/2014, 9:53 AM   LOS: 4 days    HPI/Subjective: No acute overnight events.  Objective: Filed Vitals:   01/09/14 0509 01/09/14 1423 01/09/14 2254 01/10/14 0658  BP: 111/77 116/71 128/74 119/70  Pulse: 95 87 105 89  Temp: 98.9 F (37.2 C) 98.6 F (37 C) 99.4 F (37.4 C) 99.6 F (37.6 C)  TempSrc: Oral Oral Oral Oral  Resp: 16 18 19 16   Height:      Weight:      SpO2: 94% 98% 97% 99%    Intake/Output Summary (Last 24 hours) at 01/10/14 0953 Last data filed at 01/10/14 0944  Gross per 24 hour  Intake  845.5 ml  Output   1550 ml  Net -704.5 ml    Exam:   General:  Pt is alert, crying, then very agitated, arguing with staff  Cardiovascular: Regular rate and rhythm, S1/S2, no murmurs  Respiratory: Clear  to auscultation bilaterally, no wheezing, no crackles, no rhonchi  Abdomen: tender in mid abdomen, non distended, bowel sounds present  Extremities: No edema, pulses DP and PT palpable bilaterally  Neuro: Grossly nonfocal  Data Reviewed: Basic Metabolic Panel:  Recent Labs Lab 01/06/14 1601 01/07/14 0440 01/08/14 0423  NA 138 137 139  K 4.4 4.0 4.6  CL 100 101 102  CO2 24 25 28   GLUCOSE 151* 104* 103*  BUN 8 6 6   CREATININE 0.69 0.67 0.70  CALCIUM 9.2 8.7 8.5   Liver Function Tests:  Recent Labs Lab 01/06/14 1601  AST 20  ALT 14  ALKPHOS 58  BILITOT 0.5  PROT 7.7  ALBUMIN 3.5   CBC:  Recent Labs Lab 01/06/14 1601 01/07/14 0440 01/08/14 0423  WBC 12.3* 11.0* 12.1*  NEUTROABS 8.9*  --   --   HGB 13.9 12.0 11.6*  HCT 39.8 35.3* 35.2*  MCV 87.9 86.7 88.7  PLT 193 180 166    Recent Results (from the past 240 hour(s))  URINE CULTURE     Status: None   Collection Time    01/07/14  2:04 AM      Result Value Ref Range Status   Specimen Description URINE, RANDOM   Final   Special Requests NONE   Final   Culture  Setup Time     Final   Value: 01/07/2014 10:34     Performed at [facility] Count     Final   Value: NO GROWTH     Performed at [facility]   Culture     Final   Value: NO GROWTH     Performed at [facility]   Report Status 01/08/2014 FINAL   Final  CULTURE, BLOOD (ROUTINE X 2)     Status: None   Collection Time    01/09/14 11:22 AM      Result Value Ref Range Status   Specimen Description BLOOD LEFT ARM   Final   Special Requests BOTTLES DRAWN AEROBIC AND ANAEROBIC 10CC EACH   Final   Culture  Setup Time     Final   Value: 01/09/2014 14:44     Performed at [facility]   Culture     Final   Value:        BLOOD CULTURE RECEIVED NO GROWTH TO DATE CULTURE WILL BE HELD FOR 5 DAYS BEFORE ISSUING A FINAL NEGATIVE REPORT     Performed at [facility]   Report Status PENDING   Incomplete   CULTURE, BLOOD (ROUTINE X 2)     Status: None   Collection Time    01/09/14 11:27 AM      Result Value Ref Range Status   Specimen Description BLOOD LEFT HAND   Final   Special Requests BOTTLES DRAWN AEROBIC AND ANAEROBIC 5CC EACH   Final   Culture  Setup Time     Final   Value: 01/09/2014 14:44     Performed at [facility]   Culture     Final   Value:        BLOOD CULTURE RECEIVED NO GROWTH TO DATE CULTURE WILL BE HELD FOR 5 DAYS BEFORE ISSUING A  FINAL NEGATIVE REPORT     Performed at [facility]   Report Status PENDING   Incomplete     Scheduled Meds: . antiseptic oral rinse  7 mL Mouth Rinse q12n4p  . ARIPiprazole  5 mg Oral BID  . chlorhexidine  15 mL Mouth Rinse BID  . ciprofloxacin  400 mg Intravenous Q12H  . enoxaparin (LOVENOX) injection  40 mg Subcutaneous QHS  . levothyroxine  125 mcg Oral QAC breakfast  . LORazepam  1 mg Oral BID  . metronidazole  500 mg Intravenous Q8H  . polyethylene glycol  17 g Oral Daily  . sertraline  200 mg Oral QPM  . sodium chloride  3 mL Intravenous Q12H   Continuous Infusions:

## 2014-01-10 NOTE — Progress Notes (Signed)
MEDICAL ONCOLOGY 01-10-2014  Reason for Consult: recurrent endometrial carcinoma Referring Physician: Nancy Marus, Leisa Lenz PCP T.Anderson at [facility] "on [place]" phone may be [phone] Psychiatry: at least one prescription on admission from Dr Reece Levy  History is from EMR and communication with gyn oncology, as well as history that patient is able to give now.   [name] is an 58 y.o. female who was referred to myself and to Dr [name] by Dr Nancy Marus, for recently diagnosed recurrent endometrial carcinoma. The patient cancelled my new patient appointment at [facility], as well as cancelling chemotherapy education class and Dr Clabe Seal visit. She then called office requesting to be seen, but was admitted on 01-06-14 prior to another available new patient spot. Present admission is for acute diverticulitis. As it has been so difficult to get patient to the Cancer Center, Dr Charlies Silvers kindly let me know of admission now.  Oncologic HPI: Patient presented to Dr [name] in April 2013 with >1 year vaginal bleeding, endometrial biopsy with grade 1 endometrial carcinoma. Patient was reluctant to proceed with care, however did have robotic assisted hysterectomy BSO 10-2011, with pathology demonstrating superficially invasive endometroid carcinoma arising in endometrial polyp, FIGO 2, involving inner 1/2 of myometrium, negative ALI or cervical stromal involvement, ovaries and tubes negative. She did not keep follow up appointments with gyn oncology. She presented to ED 11-22-13 with concerns for recurrence, FTKA follow up with Dr Lurline Idol and CT. She was seen by Dr [name] on 12-12-13 requesting pain medication, complaining of continuous urinary leakage, some vaginal bleeding and pain involving back and right hip. Exam then had 5-6 cm mass at vaginal cuff, biopsy [id number] had invasive adenocarcinoma consistent with recurrent endometroid carcinoma. She had CT AP 12-12-13,  this compared with 09-2012 showed new lobulated soft tissue mass involving vaginal cuff and posterior wall of the urinary bladder, with areas of internal necrosis, measuring 3.7 x 6.9 cm; there was also new pelvic adenopathy at iliac bifurcations bilaterally, but no obvious disease outside of pelvis, including nothing involving bowels,  and no hydronephrosis. Patient subsequently cancelled new patient appointments with medical oncology and radiation oncology, despite several conversations with our offices, including [employer] officer, museum. Patient was admitted thru ED on 01-06-14 with acute diverticulitis. CT AP on admission had new focal acute diverticulosis involving mid sigmoid colon; on the prior CT there was no mention of tumor in that area. The pelvic mass was measured 7.8 x 4.0 cm.  Patient continues on cipro and flagyl, with improvement in temperatures but still elevated WBC. She reports dark or bloody urine and painful urination. She also complains of chronic pain, with concern for drug seeking behavior. Psychiatry has consulted, however she continues to refuse Abilify.   ROS Duration of recent abdominal and pelvic problems not clear, tho patient feels that symptoms began when her psychiatric medications were changed (?). Appetite poor, is drinking fluids. Slept some last pm. More comfortable supine in bed than up in chair. Denies SOB or cough. Denies other bleeding. Fatigue. Constipation PTA. No joint or bone pain. Depressed, "I feel like I am going to die", crying frequently.  Allergies:  Allergies  Allergen Reactions  . Sulfa Antibiotics Other (See Comments)    Unknown-pt states she is unsure of what her reaction is  . Sulfamethoxazole-Trimethoprim Other (See Comments)    Unknown reaction   Past Medical History  Diagnosis Date  . Thyroid disease   . Arthritis   . Asthma   .  Depression   . Fibromyalgia   . Lumbar spondylolysis   . Allergy   . Endometrial cancer   . Spinal stenosis   .  Hypertension   . Dysrhythmia     OCCAS PAC'S AND PVC'S  PER HOLTER MONITOR STUDY --PT HAS OCCAS CHEST PAINS AND PALPITATIONS--HAD ECHO NORMAL LV SIZE AND FUNCTION.  Marland Kitchen Sleep apnea     HAS CPAP MACHINE BUT DOES NOT USE  . Anxiety     HAS OCD AND ON DISABILITY-PER [name]   . Back pain, chronic   . Obese     Past Surgical History  Procedure Laterality Date  . Cesarean section    . Abdominal hysterectomy      total    Family History  Problem Relation Age of Onset  . Hypertension Mother   . Coronary artery disease Father   . Cancer Brother     Renal cell carcinoma  . Hypertension Other   . Cancer Other   . Heart attack Father   . Coronary artery disease Brother   Father was smoker  Social History:  reports that she has never smoked. She has never used smokeless tobacco. She reports that she does not drink alcohol or use illicit drugs. Originally from [facility], lives with mother. Sister and brother also live locally. Has not worked.   Medications: reviewed in EMR  Blood pressure 119/70, pulse 89, temperature 99.6 F (37.6 C), temperature source Oral, resp. rate 16, height 4\' 11"  (1.499 m), weight 174 lb 11.2 oz (79.243 kg), SpO2 99.00%. Physical Exam Obese WF looks stated age, crying several times during visit. Normal hair pattern. PERRL, not icteric. Oral mucosa a little dry without lesions. No JVD supine. Respirations not labored RA. Lungs clear anteriorly and laterally. Peripheral IV site ok. Breast exam not done. No supraclavicular, cervical, axillary or inguinal adenopathy. Abdomen obese, soft, quiet, not tender to gentle palpation. LE no pitting edema, cords, tenderness. Feet warm. Moves all extremities in bed, speech fluent and appropriate, oriented to place and situation. CN intact.     Results for orders placed during the hospital encounter of 01/06/14 (from the past 48 hour(s))  CULTURE, BLOOD (ROUTINE X 2)     Status: None   Collection Time    01/09/14 11:22 AM       Result Value Ref Range   Specimen Description BLOOD LEFT ARM     Special Requests BOTTLES DRAWN AEROBIC AND ANAEROBIC 10CC EACH     Culture  Setup Time       Value: 01/09/2014 14:44     Performed at [facility]   Culture       Value:        BLOOD CULTURE RECEIVED NO GROWTH TO DATE CULTURE WILL BE HELD FOR 5 DAYS BEFORE ISSUING A FINAL NEGATIVE REPORT     Performed at [facility]   Report Status PENDING    CULTURE, BLOOD (ROUTINE X 2)     Status: None   Collection Time    01/09/14 11:27 AM      Result Value Ref Range   Specimen Description BLOOD LEFT HAND     Special Requests BOTTLES DRAWN AEROBIC AND ANAEROBIC 5CC EACH     Culture  Setup Time       Value: 01/09/2014 14:44     Performed at [facility]   Culture       Value:        BLOOD CULTURE RECEIVED NO GROWTH  TO DATE CULTURE WILL BE HELD FOR 5 DAYS BEFORE ISSUING A FINAL NEGATIVE REPORT     Performed at [facility]   Report Status PENDING    URINALYSIS, ROUTINE W REFLEX MICROSCOPIC     Status: Abnormal   Collection Time    01/09/14  2:19 PM      Result Value Ref Range   Color, Urine AMBER (*) YELLOW   Comment: BIOCHEMICALS MAY BE AFFECTED BY COLOR   APPearance CLOUDY (*) CLEAR   Specific Gravity, Urine 1.016  1.005 - 1.030   pH 5.5  5.0 - 8.0   Glucose, UA NEGATIVE  NEGATIVE mg/dL   Hgb urine dipstick LARGE (*) NEGATIVE   Bilirubin Urine NEGATIVE  NEGATIVE   Ketones, ur NEGATIVE  NEGATIVE mg/dL   Protein, ur 30 (*) NEGATIVE mg/dL   Urobilinogen, UA 1.0  0.0 - 1.0 mg/dL   Nitrite NEGATIVE  NEGATIVE   Leukocytes, UA MODERATE (*) NEGATIVE  URINE MICROSCOPIC-ADD ON     Status: Abnormal   Collection Time    01/09/14  2:19 PM      Result Value Ref Range   Squamous Epithelial / LPF RARE  RARE   WBC, UA 11-20  <3 WBC/hpf   RBC / HPF 21-50  <3 RBC/hpf   Bacteria, UA FEW (*) RARE    Dg Chest Port 1 View  01/09/2014   CLINICAL DATA:  Initial evaluation for fever, personal history of  hypertension asthma and endometrial cancer  EXAM: PORTABLE CHEST - 1 VIEW  COMPARISON:  12/26/2012  FINDINGS: Heart size and vascular pattern are normal. No infiltrate or effusion.  IMPRESSION: No active disease.   Electronically Signed   By: Skipper Cliche M.D.   On: 01/09/2014 11:19   CT ABDOMEN AND PELVIS WITH CONTRAST   01-06-14  COMPARISON: 12/12/2013  FINDINGS:  Lower chest: The lung bases are clear of acute process. No pleural  effusion or pulmonary lesions. The heart is normal in size. No  pericardial effusion. The distal esophagus and aorta are  unremarkable.  Hepatobiliary: Mild diffuse fatty infiltration of the liver but no  focal hepatic lesions or intrahepatic biliary dilatation. The  gallbladder is normal. No common bowel duct dilatation.  Pancreas: Normal.  Spleen: Stable splenomegaly.  Adrenals/Urinary Tract: Normal.  Stomach/Bowel: The stomach, duodenum, and small bowel are  unremarkable. The terminal ileum is normal. The colon is normal  except for focal acute diverticulitis involving the mid sigmoid  colon. There is tumor surrounding the colon at this level but this  appears to be an acute inflammatory process.  Vascular/Lymphatic: The aorta and branch vessels are patent. The  major venous structures are patent. There are several small  scattered mesenteric and retroperitoneal lymph nodes which appear  relatively stable. Stable enlarged pelvic sidewall lymphadenopathy  bilaterally. The necrotic locking pelvic mass is slightly larger. It  previously measured a maximum of 7 x 3.7 cm and now measures 7.8 x  4.0 cm.  Reproductive: The uterus and ovaries are surgically absent. The  bladder appears grossly normal. Could not exclude involvement of the  left bladder wall by the pelvic mass.  Other:  Musculoskeletal: No significant findings.  IMPRESSION:  Slightly larger pelvic mass and relatively stable pelvic adenopathy.  Acute sigmoid diverticulitis without  complicating features.   DISCUSSION: Patient understands that recommendation is for low dose sensitizing chemotherapy with radiation for the recurrent endometrial cancer. She is very anxious about chemotherapy, but may agree to some teaching. She understands  that the acute diverticulitis problem needs to be resolved before any chemotherapy can be given. I will let Dr [name] know of admission, as it may also be best for him to see her in consultation while in hospital. She has not had cystoscopy, which could be considered if the bladder symptoms continue or progress, as she may well have significant involvement of bladder from this pelvic recurrence. Outpatient care is obviously going to be very challenging, including chronic pain and psychiatric problems.  Assessment/Plan: 1.Recurrent endometrial adenocarcinoma with pelvic mass which appears to involve posterior bladder and iliac nodes bilaterally: in patient with history of superficially invasive FIGO 2 endometroid carcinoma treated with (delayed) hysterectomy BSO by Dr [name] [phone], then lost to follow up. Recommendation is for radiation with sensitizing CDDP. I will let Dr [name] know of her admission. May be helpful for urology to evaluate bladder concerns. 2.acute sigmoid diverticulitis: not resolving as quickly as expected per Dr Fortino Sic notes. On cipro + flagyl 3.chronic pain and/or drug seeking behavior 4.major depressive disorder: psychiatry involved. She is refusing Abilify 5hypothyroidism on synthroid 6.obesity   Please call between my rounds if needed. Thank you.    Robley Matassa P 01/10/2014, 9:31 AM   Pager [phone] Office [phone]

## 2014-01-10 NOTE — Progress Notes (Signed)
Patient ID: [name], female   DOB: 1956/02/01, 58 y.o.   MRN: [id number] [facility] MD Progress Note  01/10/2014 1:43 PM Liberti Appleton Yamin  MRN:  [id number]  Subjective:  Patient is seen for psych consultation follow up. Patient has been suffering with severe mood swings, irritability, loud, blaming the some staff members not offering her pain medication ten minutes early etc. She has difficult to sleep and her mother is at bed side stated that she never seen her so miserable at home. She has been repeatedly asking what's wrong with her physical condition and wants to know her urin culture results etc. Staff RN who came to give her scheduled pain medication stated that she was informed about the results this morning. She has been complaint with her psych medcations and no adverse effects. She slept but reportedly disturbed with several awakening and her appetite is still poor.   She has been seeing a psychiatrist at [facility]" for depression and anxiety over five years. She has denied suicidal or homicidal ideation, intention or plans. She has no evidence of psychosis. She has anxious mood, and reports she needs frequent pain medication. She has been disabled and lives with her mother over several years.     Diagnosis:   DSM5: Schizophrenia Disorders:   Obsessive-Compulsive Disorders:   Trauma-Stressor Disorders:   Substance/Addictive Disorders:   Depressive Disorders:  Major Depressive Disorder - Severe (296.23) Total Time spent with patient: 30 minutes  Axis I: Bipolar, Depressed  ADL's:  Impaired  Sleep: Good  Appetite:  Poor  Suicidal Ideation:  Denied active suicidal ideation and states that she feels like she is going to die because of medical problems Homicidal Ideation:  denied AEB (as evidenced by):  Psychiatric Specialty Exam: Physical Exam  ROS  Blood pressure 119/70, pulse 89, temperature 99.6 F (37.6 C), temperature source Oral, resp. rate 16,  height 4' 11"  (1.499 m), weight 79.243 kg (174 lb 11.2 oz), SpO2 99.00%.Body mass index is 35.27 kg/(m^2).  General Appearance: Guarded  Eye Contact::  Good  Speech:  Clear and Coherent and Slow  Volume:  Normal  Mood:  Anxious and Depressed  Affect:  Congruent, Depressed and Tearful  Thought Process:  Coherent and Goal Directed  Orientation:  Full (Time, Place, and Person)  Thought Content:  WDL  Suicidal Thoughts:  No  Homicidal Thoughts:  No  Memory:  Immediate;   Good Recent;   Good  Judgement:  Intact  Insight:  Fair  Psychomotor Activity:  Decreased  Concentration:  Good  Recall:  Good  Fund of Knowledge:Fair  Language: Good  Akathisia:  No  Handed:  Right  AIMS (if indicated):     Assets:  Communication Skills Desire for Improvement Financial Resources/Insurance Housing Intimacy Leisure Time Resilience Social Support  Sleep:      Musculoskeletal: Strength & Muscle Tone: decreased Gait & Station: unable to stand Patient leans: N/A  Current Medications: Current Facility-Administered Medications  Medication Dose Route Frequency Provider Last Rate Last Dose  . acetaminophen (TYLENOL) tablet 650 mg  650 mg Oral Q6H PRN Gardiner Barefoot, NP   650 mg at 01/07/14 2247  . albuterol (PROVENTIL) (2.5 MG/3ML) 0.083% nebulizer solution 2.5 mg  2.5 mg Inhalation Q4H PRN Theodis Blaze, MD      . antiseptic oral rinse (CPC / CETYLPYRIDINIUM CHLORIDE 0.05%) solution 7 mL  7 mL Mouth Rinse q12n4p Christina P Rama, MD      . ARIPiprazole (ABILIFY) tablet 5  mg  5 mg Oral BID Durward Parcel, MD   5 mg at 01/10/14 0900  . chlorhexidine (PERIDEX) 0.12 % solution 15 mL  15 mL Mouth Rinse BID Venetia Maxon Rama, MD   15 mL at 01/10/14 0900  . ciprofloxacin (CIPRO) IVPB 400 mg  400 mg Intravenous Q12H Theodis Blaze, MD   400 mg at 01/10/14 1033  . enoxaparin (LOVENOX) injection 40 mg  40 mg Subcutaneous QHS Theodis Blaze, MD   40 mg at 01/09/14 2144  . HYDROmorphone  (DILAUDID) injection 2 mg  2 mg Intravenous Q4H PRN Robbie Lis, MD      . levothyroxine (SYNTHROID, LEVOTHROID) tablet 125 mcg  125 mcg Oral QAC breakfast Theodis Blaze, MD   125 mcg at 01/10/14 0900  . LORazepam (ATIVAN) tablet 1 mg  1 mg Oral BID Theodis Blaze, MD   1 mg at 01/10/14 0943  . metroNIDAZOLE (FLAGYL) IVPB 500 mg  500 mg Intravenous Q8H Theodis Blaze, MD   500 mg at 01/10/14 0640  . ondansetron (ZOFRAN) tablet 4 mg  4 mg Oral Q6H PRN Theodis Blaze, MD       Or  . ondansetron [facility]) injection 4 mg  4 mg Intravenous Q6H PRN Theodis Blaze, MD   4 mg at 01/08/14 2330  . oxyCODONE (Oxy IR/ROXICODONE) immediate release tablet 10 mg  10 mg Oral Q4H PRN Venetia Maxon Rama, MD   10 mg at 01/10/14 1141  . polyethylene glycol (MIRALAX / GLYCOLAX) packet 17 g  17 g Oral Daily Venetia Maxon Rama, MD   17 g at 01/10/14 0943  . sertraline (ZOLOFT) tablet 200 mg  200 mg Oral QPM Theodis Blaze, MD   200 mg at 01/09/14 1812  . sodium chloride 0.9 % injection 3 mL  3 mL Intravenous Q12H Robbie Lis, MD   3 mL at 01/10/14 7902    Lab Results:  Results for orders placed during the hospital encounter of 01/06/14 (from the past 48 hour(s))  CULTURE, BLOOD (ROUTINE X 2)     Status: None   Collection Time    01/09/14 11:22 AM      Result Value Ref Range   Specimen Description BLOOD LEFT ARM     Special Requests BOTTLES DRAWN AEROBIC AND ANAEROBIC 10CC EACH     Culture  Setup Time       Value: 01/09/2014 14:44     Performed at [facility]   Culture       Value:        BLOOD CULTURE RECEIVED NO GROWTH TO DATE CULTURE WILL BE HELD FOR 5 DAYS BEFORE ISSUING A FINAL NEGATIVE REPORT     Performed at [facility]   Report Status PENDING    CULTURE, BLOOD (ROUTINE X 2)     Status: None   Collection Time    01/09/14 11:27 AM      Result Value Ref Range   Specimen Description BLOOD LEFT HAND     Special Requests BOTTLES DRAWN AEROBIC AND ANAEROBIC 5CC EACH     Culture  Setup Time        Value: 01/09/2014 14:44     Performed at [facility]   Culture       Value:        BLOOD CULTURE RECEIVED NO GROWTH TO DATE CULTURE WILL BE HELD FOR 5 DAYS BEFORE ISSUING A FINAL NEGATIVE REPORT  Performed at [facility]   Report Status PENDING    URINALYSIS, ROUTINE W REFLEX MICROSCOPIC     Status: Abnormal   Collection Time    01/09/14  2:19 PM      Result Value Ref Range   Color, Urine AMBER (*) YELLOW   Comment: BIOCHEMICALS MAY BE AFFECTED BY COLOR   APPearance CLOUDY (*) CLEAR   Specific Gravity, Urine 1.016  1.005 - 1.030   pH 5.5  5.0 - 8.0   Glucose, UA NEGATIVE  NEGATIVE mg/dL   Hgb urine dipstick LARGE (*) NEGATIVE   Bilirubin Urine NEGATIVE  NEGATIVE   Ketones, ur NEGATIVE  NEGATIVE mg/dL   Protein, ur 30 (*) NEGATIVE mg/dL   Urobilinogen, UA 1.0  0.0 - 1.0 mg/dL   Nitrite NEGATIVE  NEGATIVE   Leukocytes, UA MODERATE (*) NEGATIVE  URINE MICROSCOPIC-ADD ON     Status: Abnormal   Collection Time    01/09/14  2:19 PM      Result Value Ref Range   Squamous Epithelial / LPF RARE  RARE   WBC, UA 11-20  <3 WBC/hpf   RBC / HPF 21-50  <3 RBC/hpf   Bacteria, UA FEW (*) RARE  CBC     Status: Abnormal   Collection Time    01/10/14 12:10 PM      Result Value Ref Range   WBC 9.5  4.0 - 10.5 K/uL   RBC 3.59 (*) 3.87 - 5.11 MIL/uL   Hemoglobin 10.5 (*) 12.0 - 15.0 g/dL   HCT 31.5 (*) 36.0 - 46.0 %   MCV 87.7  78.0 - 100.0 fL   MCH 29.2  26.0 - 34.0 pg   MCHC 33.3  30.0 - 36.0 g/dL   RDW 12.5  11.5 - 15.5 %   Platelets 222  150 - 400 K/uL  BASIC METABOLIC PANEL     Status: Abnormal   Collection Time    01/10/14 12:10 PM      Result Value Ref Range   Sodium 141  137 - 147 mEq/L   Potassium 3.8  3.7 - 5.3 mEq/L   Chloride 102  96 - 112 mEq/L   CO2 27  19 - 32 mEq/L   Glucose, Bld 91  70 - 99 mg/dL   BUN 7  6 - 23 mg/dL   Creatinine, Ser 0.60  0.50 - 1.10 mg/dL   Calcium 8.2 (*) 8.4 - 10.5 mg/dL   GFR calc non Af Amer >90  >90 mL/min   GFR calc  Af Amer >90  >90 mL/min   Comment: (NOTE)     The eGFR has been calculated using the CKD EPI equation.     This calculation has not been validated in all clinical situations.     eGFR's persistently <90 mL/min signify possible Chronic Kidney     Disease.   Anion gap 12  5 - 15    Physical Findings: AIMS:  , ,  ,  ,    CIWA:    COWS:     Treatment Plan Summary: Daily contact with patient to assess and evaluate symptoms and progress in treatment Medication management  Plan: Increase Abilify 10 mg twice daily for mood swings and continue Zoloft 200 mg for depression May refer to out patient psych treatment when medically stable Will follow up as clinically required while in hospital.  Medical Decision Making Problem Points:  Established problem, worsening (2), New problem, with no additional work-up planned (3), Review  of last therapy session (1), Review of psycho-social stressors (1) and Self-limited or minor (1) Data Points:  Review or order clinical lab tests (1) Review or order medicine tests (1) Review of medication regiment & side effects (2) Review of new medications or change in dosage (2)  I certify that inpatient services furnished can reasonably be expected to improve the patient's condition.   Nataliyah Packham,JANARDHAHA R. 01/10/2014, 1:43 PM

## 2014-01-11 LAB — URINE CULTURE
Colony Count: NO GROWTH
Culture: NO GROWTH

## 2014-01-11 LAB — BASIC METABOLIC PANEL
ANION GAP: 9 (ref 5–15)
BUN: 5 mg/dL — ABNORMAL LOW (ref 6–23)
CO2: 28 meq/L (ref 19–32)
Calcium: 8.4 mg/dL (ref 8.4–10.5)
Chloride: 100 mEq/L (ref 96–112)
Creatinine, Ser: 0.62 mg/dL (ref 0.50–1.10)
GFR calc Af Amer: >90 mL/min (ref >90)
GFR calc non Af Amer: >90 mL/min (ref >90)
Glucose, Bld: 88 mg/dL (ref 70–99)
Potassium: 3.6 mEq/L — ABNORMAL LOW (ref 3.7–5.3)
SODIUM: 137 meq/L (ref 137–147)

## 2014-01-11 LAB — CBC
HCT: 33.4 % — ABNORMAL LOW (ref 36.0–46.0)
Hemoglobin: 10.8 g/dL — ABNORMAL LOW (ref 12.0–15.0)
MCH: 28.7 pg (ref 26.0–34.0)
MCHC: 32.3 g/dL (ref 30.0–36.0)
MCV: 88.8 fL (ref 78.0–100.0)
Platelets: 231 10*3/uL (ref 150–400)
RBC: 3.76 MIL/uL — AB (ref 3.87–5.11)
RDW: 12.7 % (ref 11.5–15.5)
WBC: 9.3 10*3/uL (ref 4.0–10.5)

## 2014-01-11 MED ORDER — POTASSIUM CHLORIDE CRYS ER 20 MEQ PO TBCR
40.0000 meq | EXTENDED_RELEASE_TABLET | Freq: Once | ORAL | Status: AC
  Administered 2014-01-11: 40 meq via ORAL
  Filled 2014-01-11: qty 2

## 2014-01-11 MED ORDER — DOCUSATE SODIUM 100 MG PO CAPS
100.0000 mg | ORAL_CAPSULE | Freq: Two times a day (BID) | ORAL | Status: DC
  Administered 2014-01-11 – 2014-01-15 (×8): 100 mg via ORAL
  Filled 2014-01-11 (×9): qty 1

## 2014-01-11 NOTE — Progress Notes (Addendum)
Patient ID: [name], female   DOB: 02-06-1956, 58 y.o.   MRN: [id number] [facility] PROGRESS NOTE  [name] WUJ:[id number] DOB: 1955-08-16 DOA: 01/06/2014 PCP: [name], FNP  Brief narrative: 58 y.o. 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 She subsequently was scheduled to see Dr. Marko Plume on 12/28/13 but did not keep appointment. She presented to the ER 01/06/14 with a chief complaint of abdominal pain and was found to have acute sigmoid diverticulitis on CT.   Assessment/Plan:   Principal Problem:  Acute sigmoid diverticulitis  Continue Cipro/Flagyl. Tolerating regular diet well  Continue analgesia as needed.  Active Problems:  Fever  Perhaps diverticulitis. No fevers over past 24 hours.  On most recent urinalysis patient did have moderate leukocytes but urine culture so far shows no growth. Blood cultures to date show no growth. CXR with no evidence of acute cardiopulmonary findings.  Hypokalemia  Supplement and repeat BMP in AM Pain medication seeking behavior  Insists percocet does not help her pain, only IV dilaudid. When told she could not be discharged on dilaudid, wanted me to prescribe 60 mg of oxycodone at a time. Told her we would have to slowly titrate up her pain medications.  Pain previously managed at a pain clinic.  Continue 10 mg oxycodone Q 4 hours, patient instructed to alternate with IV dilaudid, titrate oxycodone up and dilaudid down to ascertain effective home regimen. Will need to follow up with her pain management MD at discharge.  Behavior  disturbance / Depression / Anxiety state Patient has a history of possible polysubstance abuse, is on psychotropic medication, and has had behavioral outbursts on the nursing unit.  Continue Zoloft, and when necessary Ativan. Patient refused Abilify. Evaluated by psychiatrist 01/08/14. Has capacity Hypothyroidism  Continue Synthroid.  TSH WNL at 1.410. Endometrial ca  Outpatient followup with Dr. Marko Plume for further evaluation/treatment recommendations.  No role for inpatient chemotherapy while the patient is undergoing treatment for an active infection. DVT Prophylaxis  Continue Lovenox.  Code Status: Full.  Family Communication: Mother updated at the bedside 01/07/14.  Disposition Plan: Home when stable.    IV Access:   Peripheral IV Procedures and diagnostic studies:   Ct Abdomen Pelvis W Contrast 01/06/2014: Slightly larger pelvic mass and relatively stable pelvic adenopathy. Acute sigmoid diverticulitis without complicating features.  Medical Consultants:   Psychiatry, Durward Parcel, MD Oncology (Dr Evlyn Clines) Other Consultants:   None. Anti-Infectives:   Cipro 01/06/14--->  Flagyl 01/06/14--->  Leisa Lenz, MD  [facility] Pager [phone]  If 7PM-7AM, please contact night-coverage [email or web address] Password [facility] 01/11/2014, 6:37 AM   LOS: 5 days   HPI/Subjective: No acute overnight events.  Objective: Filed Vitals:   01/10/14 0658 01/10/14 1418 01/10/14 2037 01/11/14 0517  BP: 119/70 119/63 95/67 118/67  Pulse: 89 88 83 76  Temp: 99.6 F (37.6 C) 98.4 F (36.9 C) 98.8 F (37.1 C) 99.2 F (37.3 C)  TempSrc: Oral Oral Oral Oral  Resp: 16 18 18 18   Height:      Weight:      SpO2: 99% 94% 96% 94%    Intake/Output Summary (Last  24 hours) at 01/11/14 5784 Last data filed at 01/11/14 0518  Gross per 24 hour  Intake    643 ml  Output   2100 ml  Net  -1457 ml    Exam:   General:  Pt is alert, follows commands appropriately, not  in acute distress  Cardiovascular: Regular rate and rhythm, S1/S2, no murmurs  Respiratory: Clear to auscultation bilaterally, no wheezing, no crackles, no rhonchi  Abdomen: tender in mid and lower abdomen, bowel sounds present  Extremities: No edema, pulses DP and PT palpable bilaterally  Neuro: Grossly nonfocal  Data Reviewed: Basic Metabolic Panel:  Recent Labs Lab 01/06/14 1601 01/07/14 0440 01/08/14 0423 01/10/14 1210 01/11/14 0425  NA 138 137 139 141 137  K 4.4 4.0 4.6 3.8 3.6*  CL 100 101 102 102 100  CO2 24 25 28 27 28   GLUCOSE 151* 104* 103* 91 88  BUN 8 6 6 7  5*  CREATININE 0.69 0.67 0.70 0.60 0.62  CALCIUM 9.2 8.7 8.5 8.2* 8.4   Liver Function Tests:  Recent Labs Lab 01/06/14 1601  AST 20  ALT 14  ALKPHOS 58  BILITOT 0.5  PROT 7.7  ALBUMIN 3.5   CBC:  Recent Labs Lab 01/06/14 1601 01/07/14 0440 01/08/14 0423 01/10/14 1210 01/11/14 0425  WBC 12.3* 11.0* 12.1* 9.5 9.3  NEUTROABS 8.9*  --   --   --   --   HGB 13.9 12.0 11.6* 10.5* 10.8*  HCT 39.8 35.3* 35.2* 31.5* 33.4*  MCV 87.9 86.7 88.7 87.7 88.8  PLT 193 180 166 222 231   Recent Results (from the past 240 hour(s))  URINE CULTURE     Status: None   Collection Time    01/07/14  2:04 AM      Result Value Ref Range Status   Specimen Description URINE, RANDOM   Final   Special Requests NONE   Final   Culture  Setup Time     Final   Value: 01/07/2014 10:34     Performed at [place]     Final   Value: NO GROWTH     Performed at [facility]   Culture     Final   Value: NO GROWTH     Performed at [facility]   Report Status 01/08/2014 FINAL   Final  CULTURE, BLOOD (ROUTINE X 2)     Status: None   Collection Time    01/09/14 11:22 AM      Result Value Ref Range Status   Specimen Description BLOOD LEFT ARM   Final   Special Requests BOTTLES DRAWN AEROBIC AND ANAEROBIC 10CC EACH   Final   Culture  Setup Time     Final   Value: 01/09/2014  14:44     Performed at [facility]   Culture     Final   Value:        BLOOD CULTURE RECEIVED NO GROWTH TO DATE CULTURE WILL BE HELD FOR 5 DAYS BEFORE ISSUING A FINAL NEGATIVE REPORT     Performed at [facility]   Report Status PENDING   Incomplete  CULTURE, BLOOD (ROUTINE X 2)     Status: None   Collection Time    01/09/14 11:27 AM      Result Value Ref Range Status   Specimen Description BLOOD LEFT HAND   Final   Special Requests BOTTLES DRAWN AEROBIC AND ANAEROBIC [facility] EACH   Final  Culture  Setup Time     Final   Value: 01/09/2014 14:44     Performed at [facility]   Culture     Final   Value:        BLOOD CULTURE RECEIVED NO GROWTH TO DATE CULTURE WILL BE HELD FOR 5 DAYS BEFORE ISSUING A FINAL NEGATIVE REPORT     Performed at [facility]   Report Status PENDING   Incomplete  URINE CULTURE     Status: None   Collection Time    01/09/14  2:19 PM      Result Value Ref Range Status   Specimen Description URINE, CLEAN CATCH   Final   Special Requests NONE   Final   Culture  Setup Time     Final   Value: 01/10/2014 04:25     Performed at [place]     Final   Value: NO GROWTH     Performed at [facility]   Culture     Final   Value: NO GROWTH     Performed at [facility]   Report Status 01/11/2014 FINAL   Final     Scheduled Meds: . ARIPiprazole  10 mg Oral BID  . ciprofloxacin  400 mg Intravenous Q12H  . enoxaparin (LOVENOX) injection  40 mg Subcutaneous QHS  . levothyroxine  125 mcg Oral QAC breakfast  . LORazepam  1 mg Oral BID  . metronidazole  500 mg Intravenous Q8H  . polyethylene glycol  17 g Oral Daily  . potassium chloride  40 mEq Oral Once  . sertraline  200 mg Oral QPM

## 2014-01-12 DIAGNOSIS — C52 Malignant neoplasm of vagina: Secondary | ICD-10-CM

## 2014-01-12 LAB — BASIC METABOLIC PANEL
ANION GAP: 12 (ref 5–15)
BUN: 6 mg/dL (ref 6–23)
CALCIUM: 8.7 mg/dL (ref 8.4–10.5)
CHLORIDE: 100 meq/L (ref 96–112)
CO2: 26 mEq/L (ref 19–32)
Creatinine, Ser: 0.67 mg/dL (ref 0.50–1.10)
GFR calc Af Amer: >90 mL/min (ref >90)
GFR calc non Af Amer: >90 mL/min (ref >90)
Glucose, Bld: 96 mg/dL (ref 70–99)
Potassium: 4.1 mEq/L (ref 3.7–5.3)
SODIUM: 138 meq/L (ref 137–147)

## 2014-01-12 NOTE — Progress Notes (Signed)
Patient ID: [name], female   DOB: October 13, 1955, 58 y.o.   MRN: [id number] [facility] PROGRESS NOTE  [name] LOV:[id number] DOB: Feb 25, 1956 DOA: 01/06/2014 PCP: [name]., FNP  Brief narrative: 58 y.o. 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 She subsequently was scheduled to see Dr. Marko Plume on 12/28/13 but did not keep appointment. She presented to the ER 01/06/14 with a chief complaint of abdominal pain and was found to have acute sigmoid diverticulitis on CT. Hospital course is complicated due to largely uncontrolled lower abdomen pain.   Assessment/Plan:   Principal Problem:  Acute sigmoid diverticulitis / Fever  Urinalysis seen with moderate leukocytes but urine culture shows no growth. Blood cultures to date show no growth. CXR with no evidence of acute cardiopulmonary findings.   Diverticulitis seen on CT abdomen. Continue Cipro and flagyl.  Continue pain management efforts: dilaudid 2 mg every 4 hours IV PRN severe pain, oxycodone 10 mg PO Q 4 hours PRN moderate pain. Active Problems:  Hypokalemia  Likely due to nausea and possible GI losses. Potassium supplemented and now WNL. Pain medication seeking behavior   Do not increase pain meds other than what the current dose is due to history of drug seeking behavior.  Pain previously managed at a pain clinic.   Will need to follow up with her pain management MD at discharge.  Behavior disturbance / Depression / Anxiety state   Patient has a history of possible polysubstance abuse, is on psychotropic medication,  and has had behavioral outbursts on the nursing unit.   Continue Zoloft, and when necessary Ativan. Patient refused Abilify.   Evaluated by psychiatrist 01/08/14. Has capacity Hypothyroidism   Continue Synthroid.   TSH WNL at 1.410. Endometrial ca   Outpatient followup with Dr. Marko Plume for further evaluation/treatment recommendations.   No role for inpatient chemotherapy while the patient is undergoing treatment for an active infection.  Will see if there is a role for palliative radiation therapy to pelvic mass. DVT Prophylaxis   Continue Lovenox.   Code Status: Full.  Family Communication: Mother updated at the bedside 01/11/14.  Disposition Plan: Home when stable.    IV Access:   Peripheral IV Procedures and diagnostic studies:    Ct Abdomen Pelvis W Contrast 01/06/2014: Slightly larger pelvic mass and relatively stable pelvic adenopathy. Acute sigmoid diverticulitis without complicating features.  Medical Consultants:   Psychiatry, Durward Parcel, MD  Oncology (Dr Evlyn Clines) Radiation oncology  Other Consultants:   None. Anti-Infectives:   Cipro 01/06/14--->  Flagyl 01/06/14--->   Tracy Lenz, MD  [facility] Pager [phone]  If 7PM-7AM, please contact night-coverage [email or web address] Password [facility] 01/12/2014, 12:37 PM   LOS: 6 days    HPI/Subjective: No acute overnight events.  Objective: Filed Vitals:   01/10/14 2037 01/11/14 0517 01/11/14 1400 01/12/14 0640  BP: 95/67 118/67 120/62 124/60  Pulse: 83 76 72 79  Temp: 98.8 F (37.1 C) 99.2 F (37.3 C) 98.6 F (37 C) 98.6 F (37 C)  TempSrc: Oral Oral Oral Oral  Resp: 18 18 18 18   Height:      Weight:  SpO2: 96% 94% 94% 94%    Intake/Output Summary (Last 24 hours) at 01/12/14 1237 Last data filed at 01/12/14 1028  Gross per 24 hour  Intake    800 ml  Output    800 ml  Net      0 ml    Exam:   General:  Pt is not in acute distress  Cardiovascular: Regular  rate and rhythm, S1/S2, no murmurs  Respiratory: Clear to auscultation bilaterally, no wheezing, no crackles, no rhonchi  Abdomen: tender in lower abdomen, bowel sounds present  Extremities: No edema, pulses DP and PT palpable bilaterally  Neuro: Grossly nonfocal  Data Reviewed: Basic Metabolic Panel:  Recent Labs Lab 01/07/14 0440 01/08/14 0423 01/10/14 1210 01/11/14 0425 01/12/14 0555  NA 137 139 141 137 138  K 4.0 4.6 3.8 3.6* 4.1  CL 101 102 102 100 100  CO2 25 28 27 28 26   GLUCOSE 104* 103* 91 88 96  BUN 6 6 7  5* 6  CREATININE 0.67 0.70 0.60 0.62 0.67  CALCIUM 8.7 8.5 8.2* 8.4 8.7   Liver Function Tests:  Recent Labs Lab 01/06/14 1601  AST 20  ALT 14  ALKPHOS 58  BILITOT 0.5  PROT 7.7  ALBUMIN 3.5   No results found for this basename: LIPASE, AMYLASE,  in the last 168 hours No results found for this basename: AMMONIA,  in the last 168 hours CBC:  Recent Labs Lab 01/06/14 1601 01/07/14 0440 01/08/14 0423 01/10/14 1210 01/11/14 0425  WBC 12.3* 11.0* 12.1* 9.5 9.3  NEUTROABS 8.9*  --   --   --   --   HGB 13.9 12.0 11.6* 10.5* 10.8*  HCT 39.8 35.3* 35.2* 31.5* 33.4*  MCV 87.9 86.7 88.7 87.7 88.8  PLT 193 180 166 222 231   Cardiac Enzymes: No results found for this basename: CKTOTAL, CKMB, CKMBINDEX, TROPONINI,  in the last 168 hours BNP: No components found with this basename: POCBNP,  CBG: No results found for this basename: GLUCAP,  in the last 168 hours  URINE CULTURE     Status: None   Collection Time    01/07/14  2:04 AM      Result Value Ref Range Status   Specimen Description URINE, RANDOM   Final   Value: NO GROWTH     Performed at [facility]   Report Status 01/08/2014 FINAL   Final  CULTURE, BLOOD (ROUTINE X 2)     Status: None   Collection Time    01/09/14 11:22 AM      Result Value Ref Range Status   Specimen Description BLOOD LEFT ARM   Final   Value:        BLOOD CULTURE RECEIVED NO GROWTH TO DATE     Performed at  [facility]   Report Status PENDING   Incomplete  CULTURE, BLOOD (ROUTINE X 2)     Status: None   Collection Time    01/09/14 11:27 AM      Result Value Ref Range Status   Specimen Description BLOOD LEFT HAND   Final   Value:        BLOOD CULTURE RECEIVED NO GROWTH TO DATE      Performed at [facility]   Report Status PENDING   Incomplete  URINE CULTURE     Status: None   Collection Time    01/09/14  2:19 PM      Result Value Ref Range Status   Specimen  Description URINE, CLEAN CATCH   Final   Value: NO GROWTH     Performed at [facility]   Report Status 01/11/2014 FINAL   Final     Scheduled Meds: . ARIPiprazole  10 mg Oral BID  . ciprofloxacin  400 mg Intravenous Q12H  . docusate sodium  100 mg Oral BID  . enoxaparin (LOVENOX) injection  40 mg Subcutaneous QHS  . levothyroxine  125 mcg Oral QAC breakfast  . LORazepam  1 mg Oral BID  . metronidazole  500 mg Intravenous Q8H  . polyethylene glycol  17 g Oral Daily  . sertraline  200 mg Oral QPM

## 2014-01-13 MED ORDER — METRONIDAZOLE 500 MG PO TABS
500.0000 mg | ORAL_TABLET | Freq: Three times a day (TID) | ORAL | Status: DC
  Administered 2014-01-13 – 2014-01-15 (×6): 500 mg via ORAL
  Filled 2014-01-13 (×9): qty 1

## 2014-01-13 MED ORDER — CIPROFLOXACIN HCL 500 MG PO TABS
500.0000 mg | ORAL_TABLET | Freq: Two times a day (BID) | ORAL | Status: DC
  Administered 2014-01-13 – 2014-01-15 (×5): 500 mg via ORAL
  Filled 2014-01-13 (×7): qty 1

## 2014-01-13 MED ORDER — OXYCODONE HCL 5 MG PO TABS
10.0000 mg | ORAL_TABLET | Freq: Four times a day (QID) | ORAL | Status: DC | PRN
  Administered 2014-01-13 – 2014-01-14 (×4): 10 mg via ORAL
  Filled 2014-01-13 (×4): qty 2

## 2014-01-13 MED ORDER — HYDROMORPHONE HCL 1 MG/ML IJ SOLN
1.0000 mg | INTRAMUSCULAR | Status: DC | PRN
  Administered 2014-01-13 – 2014-01-14 (×7): 2 mg via INTRAVENOUS
  Administered 2014-01-14 – 2014-01-15 (×2): 1 mg via INTRAVENOUS
  Administered 2014-01-15: 2 mg via INTRAVENOUS
  Administered 2014-01-15: 1 mg via INTRAVENOUS
  Filled 2014-01-13: qty 2
  Filled 2014-01-13: qty 1
  Filled 2014-01-13: qty 2
  Filled 2014-01-13: qty 1
  Filled 2014-01-13 (×2): qty 2
  Filled 2014-01-13: qty 1
  Filled 2014-01-13 (×4): qty 2

## 2014-01-13 MED ORDER — HYDROMORPHONE HCL 1 MG/ML IJ SOLN
1.0000 mg | Freq: Once | INTRAMUSCULAR | Status: AC
  Administered 2014-01-13: 1 mg via INTRAVENOUS
  Filled 2014-01-13: qty 1

## 2014-01-13 NOTE — Progress Notes (Signed)
Patient's IV infiltrated while receiving dilaudid IV. On call hospitalist made aware of patient's request to receive extra dilaudid with new iv access. She states she never got any relief from the original dose. New orders received.

## 2014-01-13 NOTE — Progress Notes (Signed)
Patient ID: [name], female   DOB: Jun 29, 1955, 58 y.o.   MRN: [id number] [facility] PROGRESS NOTE  [name] WUJ:[id number] DOB: 03-Jul-1955 DOA: 01/06/2014 PCP: Tracy Drafts., FNP  Brief narrative: 58 y.o. female with a PMH of substance abuse managed at the 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 She subsequently was scheduled to see Dr. Marko Mcdaniel on 12/28/13 but did not keep appointment. She presented to the ER 01/06/14 with a chief complaint of abdominal pain and was found to have acute sigmoid diverticulitis on CT.   Assessment/Plan:    Principal Problem:  Acute sigmoid diverticulitis / Fever  Urinalysis seen with moderate leukocytes but urine culture shows no growth. Blood cultures to date show no growth. CXR with no evidence of acute cardiopulmonary findings.  Diverticulitis seen on CT abdomen. Continue Cipro and flagyl but switch to PO regimen in anticipation of possible D/C in next 24 hours. Continue pain management efforts: dilaudid 2 mg every 4 hours IV PRN severe pain, oxycodone 10 mg PO Q 4 hours PRN moderate pain. Active Problems:  Hypokalemia  Likely due to nausea and possible GI losses.  Potassium repleted and now WNL. Pain medication seeking behavior  Do not increase pain meds other than what the current dose is due to history of drug seeking behavior.  Pain previously managed at a pain clinic.  Will need to follow up with her pain management MD at discharge.  Behavior disturbance / Depression / Anxiety state  Patient has a history of possible polysubstance abuse, is on psychotropic medication, and has had  behavioral outbursts. Continue Zoloft, and when necessary Ativan. Patient refused Abilify.  Evaluated by psychiatrist 01/08/14. Has capacity. Hypothyroidism  Continue Synthroid.  TSH WNL at 1.410. Endometrial ca  Outpatient followup with Dr. Marko Mcdaniel for further evaluation/treatment recommendations. No role for inpatient chemotherapy while the patient is undergoing treatment for an active diverticulitis. Will see if there is a role for palliative radiation therapy to pelvic mass. DVT Prophylaxis  Continue Lovenox while in hospital.   Code Status: Full.  Family Communication: Mother updated at the bedside 01/11/14.  Disposition Plan: Home when stable.   IV Access:   Peripheral IV Procedures and diagnostic studies:    Ct Abdomen Pelvis W Contrast 01/06/2014: Slightly larger pelvic mass and relatively stable pelvic adenopathy. Acute sigmoid diverticulitis without complicating features.   Medical Consultants:   Psychiatry, Tracy Parcel, MD  Oncology (Dr [name])  Radiation oncology   Other Consultants:   None.  Anti-Infectives:   Cipro 01/06/14--->  Flagyl 01/06/14--->   Tracy Lenz, MD  [facility] Pager [phone]  If 7PM-7AM, please contact night-coverage [email or web address] Password [email or web address] 01/13/2014, 11:12 AM   LOS: 7 days    HPI/Subjective: No acute overnight events.  Objective: Filed Vitals:   01/12/14 0640 01/12/14 1355 01/12/14 2100 01/13/14 0444  BP: 124/60 111/51 126/63 124/70  Pulse: 79 70 72 79  Temp: 98.6 F (37 C) 98.3 F (36.8 C) 98.3 F (36.8 C) 98 F (36.7 C)  TempSrc: Oral Oral Oral Oral  Resp: 18 18 18 20   Height:      Weight:      SpO2: 94% 98% 98% 97%  Intake/Output Summary (Last 24 hours) at 01/13/14 1112 Last data filed at 01/13/14 0449  Gross per 24 hour  Intake   1130 ml  Output   1350 ml  Net   -220 ml    Exam:   General:  Pt is alert, not in acute distress  Cardiovascular: Regular rate and  rhythm, S1/S2, no murmurs  Respiratory: Clear to auscultation bilaterally, no wheezing, no crackles, no rhonchi  Abdomen: tender in mid abdomen, bowel sounds present  Extremities: No edema, pulses DP and PT palpable bilaterally  Data Reviewed: Basic Metabolic Panel:  Recent Labs Lab 01/07/14 0440 01/08/14 0423 01/10/14 1210 01/11/14 0425 01/12/14 0555  NA 137 139 141 137 138  K 4.0 4.6 3.8 3.6* 4.1  CL 101 102 102 100 100  CO2 25 28 27 28 26   GLUCOSE 104* 103* 91 88 96  BUN 6 6 7  5* 6  CREATININE 0.67 0.70 0.60 0.62 0.67  CALCIUM 8.7 8.5 8.2* 8.4 8.7   Liver Function Tests:  Recent Labs Lab 01/06/14 1601  AST 20  ALT 14  ALKPHOS 58  BILITOT 0.5  PROT 7.7  ALBUMIN 3.5   No results found for this basename: LIPASE, AMYLASE,  in the last 168 hours No results found for this basename: AMMONIA,  in the last 168 hours CBC:  Recent Labs Lab 01/06/14 1601 01/07/14 0440 01/08/14 0423 01/10/14 1210 01/11/14 0425  WBC 12.3* 11.0* 12.1* 9.5 9.3  NEUTROABS 8.9*  --   --   --   --   HGB 13.9 12.0 11.6* 10.5* 10.8*  HCT 39.8 35.3* 35.2* 31.5* 33.4*  MCV 87.9 86.7 88.7 87.7 88.8  PLT 193 180 166 222 231   Cardiac Enzymes: No results found for this basename: CKTOTAL, CKMB, CKMBINDEX, TROPONINI,  in the last 168 hours BNP: No components found with this basename: POCBNP,  CBG: No results found for this basename: GLUCAP,  in the last 168 hours  Recent Results (from the past 240 hour(s))  URINE CULTURE     Status: None   Collection Time    01/07/14  2:04 AM      Result Value Ref Range Status   Specimen Description URINE, RANDOM   Final   Special Requests NONE   Final   Culture  Setup Time     Final   Value: 01/07/2014 10:34     Performed at [facility]     Final   Value: NO GROWTH     Performed at [facility]   Culture     Final   Value: NO GROWTH     Performed at [facility]   Report Status 01/08/2014 FINAL   Final   CULTURE, BLOOD (ROUTINE X 2)     Status: None   Collection Time    01/09/14 11:22 AM      Result Value Ref Range Status   Specimen Description BLOOD LEFT ARM   Final   Special Requests BOTTLES DRAWN AEROBIC AND ANAEROBIC 10CC EACH   Final   Culture  Setup Time     Final   Value: 01/09/2014 14:44     Performed at [facility]   Culture     Final   Value:        BLOOD CULTURE RECEIVED NO GROWTH TO DATE CULTURE WILL BE HELD FOR 5 DAYS BEFORE ISSUING A FINAL NEGATIVE REPORT     Performed at [facility]  Report Status PENDING   Incomplete  CULTURE, BLOOD (ROUTINE X 2)     Status: None   Collection Time    01/09/14 11:27 AM      Result Value Ref Range Status   Specimen Description BLOOD LEFT HAND   Final   Special Requests BOTTLES DRAWN AEROBIC AND ANAEROBIC [facility] EACH   Final   Culture  Setup Time     Final   Value: 01/09/2014 14:44     Performed at [facility]   Culture     Final   Value:        BLOOD CULTURE RECEIVED NO GROWTH TO DATE CULTURE WILL BE HELD FOR 5 DAYS BEFORE ISSUING A FINAL NEGATIVE REPORT     Performed at [facility]   Report Status PENDING   Incomplete  URINE CULTURE     Status: None   Collection Time    01/09/14  2:19 PM      Result Value Ref Range Status   Specimen Description URINE, CLEAN CATCH   Final   Special Requests NONE   Final   Culture  Setup Time     Final   Value: 01/10/2014 04:25     Performed at [place]     Final   Value: NO GROWTH     Performed at [facility]   Culture     Final   Value: NO GROWTH     Performed at [facility]   Report Status 01/11/2014 FINAL   Final     Scheduled Meds: . ARIPiprazole  10 mg Oral BID  . ciprofloxacin  500 mg Oral BID  . docusate sodium  100 mg Oral BID  . levothyroxine  125 mcg Oral QAC breakfast  . LORazepam  1 mg Oral BID  . metroNIDAZOLE  500 mg Oral 3 times per day  . polyethylene glycol  17 g Oral Daily  . sertraline   200 mg Oral QPM

## 2014-01-14 ENCOUNTER — Ambulatory Visit
Admit: 2014-01-14 | Discharge: 2014-01-14 | Disposition: A | Discharge status: Home / Self Care | Payer: Medicaid Other | Attending: Radiation Oncology | Admitting: Radiation Oncology

## 2014-01-14 ENCOUNTER — Encounter: Payer: Self-pay | Admitting: Radiation Oncology

## 2014-01-14 DIAGNOSIS — C541 Malignant neoplasm of endometrium: Secondary | ICD-10-CM

## 2014-01-14 NOTE — Progress Notes (Signed)
Brief Nutrition Follow Up  Diet Regular.  Now with fair intake per patient.  Patient reports that she is not depressed but just too tired to eat.    Wt Readings from Last 3 Encounters:  01/06/14 174 lb 11.2 oz (79.243 kg)  12/12/13 178 lb (80.74 kg)  02/28/13 182 lb (82.555 kg)   Chart and labs reviewed.  No new weight.    Educated patient on the importance of regularly scheduled meals/snacks.  Teach back method used.  Patient refused supplements at this time.  RD to follow.  Antonieta Iba, RD, LDN Clinical Inpatient Dietitian Pager:  [phone] Weekend and after hours pager:  [phone]

## 2014-01-14 NOTE — Care Management Note (Signed)
CARE MANAGEMENT NOTE 01/14/2014  Patient:  [name],[name]   Account Number:  [id number]  Date Initiated:  01/07/2014  Documentation initiated by:  Marney Doctor  Subjective/Objective Assessment:   58 yo admitted with abd pain.  Hx of  Endometrial ca and polysubstance abuse     Action/Plan:   From home with mom   Anticipated DC Date:  01/15/2014   Anticipated DC Plan:  [place]  CM consult      Choice offered to / List presented to:             Status of service:  In process, will continue to follow Medicare Important Message given?   (If response is "NO", the following Medicare IM given date fields will be blank) Date Medicare IM given:   Medicare IM given by:   Date Additional Medicare IM given:   Additional Medicare IM given by:    Discharge Disposition:    Per UR Regulation:    If discussed at Long Length of Stay Meetings, dates discussed:    Comments:  01/14/14 Marney Doctor RN,BSN,NCM [phone] Met with pt to assess for DC needs.  Pt states that she doesn't want any HH coming to her house.  Pt informed that if she needs [facility] services in the future she can have that set up through her MD office.  01/07/14 Marney Doctor RN,BSN,NCM CM following for DC needs.

## 2014-01-14 NOTE — Progress Notes (Signed)
Medical Oncology  Patient seen, mother at bedside. She looks more comfortable than when I met her last week, easily conversant, not crying or agitated. She has eaten egg,bagel,bacon for breakfast and roast for lunch, has been up in room. She requested IV dilaudid, but did not persist with this when I told her that I was not involved with pain medication now. She is in agreement with plan for radiation therapy with sensitizing chemotherapy, still needs chemo education which I will set [facility] at [facility] after discharge. She understands that the cancer treatments have been held due to acute diverticulitis.  Chemo education class and appointments at [facility] will be set up. Thank you   MD Pager [phone] Office [phone] 

## 2014-01-14 NOTE — Progress Notes (Signed)
CSW received notification from MD that pt expressed concern about returning home.   CSW discussed with MD that pt had not yet been evaluated by PT in order to assess pt needs. MD ordered PT evaluation. PT attempted to evaluate pt, but pt declined.  CSW and RNCM met with pt at bedside to discuss disposition needs. Pt mother present at this time.  Pt states that she plans to return home upon discharge.   Pt shared that she was concerned about her nutrition at home and her pain control. CSW provided supportive listening as pt discussed that IV pain medication helped with her pain and she is concerned about transitioning to oral pain medication. CSW and RNCM discussed home health RN and other home health services that may be beneficial to pt at home and assist pt in addressing the concerns she has. Pt declined these services.   Pt continued to discuss what she felt her needs were for pain medication and asked questions regarding her diagnosis, treatment, and prognosis. CSW and RNCM discussed with pt that these are questions for the doctor. Pt became emotional stating that "she just wants to give up" and "how much longer do they think I have to live?" CSW provided emotional support and discussed with pt that these are questions that she needs to speak with her doctors about in order to have a better understanding. Pt expressed understanding and discussed that her pain is significant at this time and concerned about it being controlled at home and that is why she makes those statements.  Pt expressed concern about transportation to medical appointments. CSW discussed Medicaid transportation and provided pt with information on Medicaid Transportation in order for pt to call and enroll.   CSW provided pt with a blank sheet of paper and a pen and encouraged pt to write down her medical questions in order to make sure she can have them answered when the appropriate MD arrives that can answer her questions.    Pt offered home health RN, PT, OT, SW, but pt declined services. Pt accepting of information about Medicaid transportation.  No further social work needs identified at this time.  Pt returning home with pt mother upon discharge.   CSW signing off.   Oryan Winterton, MSW, LCSW Clinical Social Work [phone]    

## 2014-01-14 NOTE — Progress Notes (Signed)
Radiation Oncology         ([phone]) [phone] ________________________________  Initial inpatient Consultation  Name: [name] MRN: [id number]  Date: 01/14/2014  DOB: 1955-11-03  SE:GBTDVVOH,YWVPXT N., FNP  Robbie Lis, MD   REFERRING PHYSICIAN: Robbie Lis, MD  DIAGNOSIS: Recurrent endometrial cancer   HISTORY OF PRESENT ILLNESS::[name] is a 58 y.o. female who is seen out courtesy of Dr. Leisa Lenz and Dr.  Nancy Marus.  The patient has been admitted to the hospital for management of acute sigmoid diverticulitis.   prior to development of this issue the patient had been scheduled for consultation with medical oncology and radiation oncology, unfortunately she missed several appointments. The patient has a prior history of early stage endometrial cancer.  She underwent robotic assisted hysterectomy BSO by Dr [name] on 10-2011, with pathology demonstrating superficially invasive endometroid carcinoma arising in endometrial polyp, FIGO 2, involving inner 1/2 of myometrium, negative LVI or cervical stromal involvement, ovaries and tubes negative. She did not keep follow up appointments with gyn oncology. Earlier this year the patient presented with vaginal bleeding pelvic pain and some discharge. She was seen by Dr [name] on 12-12-13  complaining of continuous urinary leakage, some vaginal bleeding and pain involving back and right hip. Exam then had 5-6 cm mass at vaginal cuff, biopsy [id number] had invasive adenocarcinoma consistent with recurrent endometroid carcinoma. She had CT AP 12-12-13, this compared with 09-2012 showed new lobulated soft tissue mass involving vaginal cuff and posterior wall of the urinary bladder, with areas of internal necrosis, measuring 3.7 x 6.9 cm; there was also new pelvic adenopathy at iliac bifurcations bilaterally, but no obvious disease outside of pelvis, including nothing involving bowels, and no hydronephrosis. The patient's pain has improved  with her inpatient Cipro and Flagyl. She is tentatively scheduled for discharge tomorrow.  Radiation oncology has been consult for coordination of her care after discharge    PREVIOUS RADIATION THERAPY: No  PAST MEDICAL HISTORY:  has a past medical history of Thyroid disease; Arthritis; Asthma; Depression; Fibromyalgia; Lumbar spondylolysis; Allergy; Endometrial cancer; Spinal stenosis; Hypertension; Dysrhythmia; Sleep apnea; Anxiety; Back pain, chronic; and Obese.    PAST SURGICAL HISTORY: Past Surgical History  Procedure Laterality Date  . Cesarean section    . Abdominal hysterectomy      total    FAMILY HISTORY: family history includes Cancer in her brother and other; Coronary artery disease in her brother and father; Heart attack in her father; Hypertension in her mother and other.  SOCIAL HISTORY:  reports that she has never smoked. She has never used smokeless tobacco. She reports that she does not drink alcohol or use illicit drugs.  ALLERGIES: Sulfa antibiotics and Sulfamethoxazole-trimethoprim  MEDICATIONS:  No current facility-administered medications for this encounter.   No current outpatient prescriptions on file.   Facility-Administered Medications Ordered in Other Encounters  Medication Dose Route Frequency Provider Last Rate Last Dose  . acetaminophen (TYLENOL) tablet 650 mg  650 mg Oral Q6H PRN Gardiner Barefoot, NP   650 mg at 01/07/14 2247  . antiseptic oral rinse (CPC / CETYLPYRIDINIUM CHLORIDE 0.05%) solution 7 mL  7 mL Mouth Rinse q12n4p Venetia Maxon Rama, MD   7 mL at 01/10/14 1551  . ARIPiprazole (ABILIFY) tablet 10 mg  10 mg Oral BID Durward Parcel, MD      . chlorhexidine (PERIDEX) 0.12 % solution 15 mL  15 mL Mouth Rinse BID Venetia Maxon Rama, MD   15 mL at  01/10/14 0900  . ciprofloxacin (CIPRO) tablet 500 mg  500 mg Oral BID Robbie Lis, MD   500 mg at 01/14/14 0735  . docusate sodium (COLACE) capsule 100 mg  100 mg Oral BID Robbie Lis, MD    100 mg at 01/14/14 0940  . HYDROmorphone (DILAUDID) injection 1-2 mg  1-2 mg Intravenous Q4H PRN Robbie Lis, MD   2 mg at 01/14/14 1531  . levothyroxine (SYNTHROID, LEVOTHROID) tablet 125 mcg  125 mcg Oral QAC breakfast Theodis Blaze, MD   125 mcg at 01/14/14 0735  . LORazepam (ATIVAN) tablet 1 mg  1 mg Oral BID Theodis Blaze, MD   1 mg at 01/14/14 0940  . metroNIDAZOLE (FLAGYL) tablet 500 mg  500 mg Oral 3 times per day Robbie Lis, MD   500 mg at 01/14/14 1531  . ondansetron (ZOFRAN) tablet 4 mg  4 mg Oral Q6H PRN Theodis Blaze, MD       Or  . ondansetron [facility]) injection 4 mg  4 mg Intravenous Q6H PRN Theodis Blaze, MD   4 mg at 01/08/14 2330  . oxyCODONE (Oxy IR/ROXICODONE) immediate release tablet 10 mg  10 mg Oral Q6H PRN Robbie Lis, MD   10 mg at 01/14/14 1258  . polyethylene glycol (MIRALAX / GLYCOLAX) packet 17 g  17 g Oral Daily Venetia Maxon Rama, MD   17 g at 01/10/14 0943  . sertraline (ZOLOFT) tablet 200 mg  200 mg Oral QPM Theodis Blaze, MD   200 mg at 01/14/14 1739  . sodium chloride 0.9 % injection 3 mL  3 mL Intravenous Q12H Robbie Lis, MD   3 mL at 01/14/14 0941    REVIEW OF SYSTEMS:  A 15 point review of systems is documented in the electronic medical record. This was obtained by the nursing staff. However, I reviewed this with the patient to discuss relevant findings and make appropriate changes.  The patient complains of pain in the pelvis with some radiation into her hip and flank area. She's had vaginal drainage and some vaginal bleeding. She was noted to have significant constipation prior to admission.   PHYSICAL EXAM:  Vitals - 1 value per visit 73/42/8768  SYSTOLIC 115  DIASTOLIC 56  Pulse 55  Temperature 98.7  Respirations 16  Weight (lb)   Height   BMI   VISIT REPORT    This is a pleasant 58 year old female lying flat in her hospital bed. She is accompanied by her mother on evaluation this evening. She is rather talkative this evening but does  respond appropriately to questions. She has concerns about her pain medication as an outpatient.  Examination of the neck and supraclavicular region reveals no evidence of adenopathy. Axillary areas are free of adenopathy. Examination of the lungs with some to be clear. The heart has a regular rhythm and rate. The abdomen is mildly distended. Bowel sounds are normal. The patient is tender with palpation in the pelvic region. On neurological examination motor strength is 5 out of 5 in the proximal and distal muscle groups of the upper and lower extremities. A pelvic examination is deferred until her outpatient appointment   ECOG = 2  2 - Symptomatic, <50% in bed during the day (Ambulatory and capable of all self care but unable to carry out any work activities. Up and about more than 50% of waking hours)  LABORATORY DATA:  Lab Results  Component Value Date  WBC 9.3 01/11/2014   HGB 10.8* 01/11/2014   HCT 33.4* 01/11/2014   MCV 88.8 01/11/2014   PLT 231 01/11/2014   NEUTROABS 8.9* 01/06/2014   Lab Results  Component Value Date   NA 138 01/12/2014   K 4.1 01/12/2014   CL 100 01/12/2014   CO2 26 01/12/2014   GLUCOSE 96 01/12/2014   CREATININE 0.67 01/12/2014   CALCIUM 8.7 01/12/2014      RADIOGRAPHY: Ct Abdomen Pelvis W Contrast  01/06/2014   CLINICAL DATA:  Severe lower pelvic pain which began today. Patient has a history of endometrial cancer.  EXAM: CT ABDOMEN AND PELVIS WITH CONTRAST  TECHNIQUE: Multidetector CT imaging of the abdomen and pelvis was performed using the standard protocol following bolus administration of intravenous contrast.  CONTRAST:  30mL OMNIPAQUE IOHEXOL 300 MG/ML SOLN, 120mL OMNIPAQUE IOHEXOL 300 MG/ML SOLN  COMPARISON:  12/12/2013  FINDINGS: Lower chest: The lung bases are clear of acute process. No pleural effusion or pulmonary lesions. The heart is normal in size. No pericardial effusion. The distal esophagus and aorta are unremarkable.  Hepatobiliary: Mild  diffuse fatty infiltration of the liver but no focal hepatic lesions or intrahepatic biliary dilatation. The gallbladder is normal. No common bowel duct dilatation.  Pancreas: Normal.  Spleen: Stable splenomegaly.  Adrenals/Urinary Tract: Normal.  Stomach/Bowel: The stomach, duodenum, and small bowel are unremarkable. The terminal ileum is normal. The colon is normal except for focal acute diverticulitis involving the mid sigmoid colon. There is tumor surrounding the colon at this level but this appears to be an acute inflammatory process.  Vascular/Lymphatic: The aorta and branch vessels are patent. The major venous structures are patent. There are several small scattered mesenteric and retroperitoneal lymph nodes which appear relatively stable. Stable enlarged pelvic sidewall lymphadenopathy bilaterally. The necrotic locking pelvic mass is slightly larger. It previously measured a maximum of 7 x 3.7 cm and now measures 7.8 x 4.0 cm.  Reproductive: The uterus and ovaries are surgically absent. The bladder appears grossly normal. Could not exclude involvement of the left bladder wall by the pelvic mass.  Other:  Musculoskeletal: No significant findings.  IMPRESSION: Slightly larger pelvic mass and relatively stable pelvic adenopathy.  Acute sigmoid diverticulitis without complicating features.   Electronically Signed   By: Kalman Jewels M.D.   On: 01/06/2014 20:03   Dg Chest Port 1 View  01/09/2014   CLINICAL DATA:  Initial evaluation for fever, personal history of hypertension asthma and endometrial cancer  EXAM: PORTABLE CHEST - 1 VIEW  COMPARISON:  12/26/2012  FINDINGS: Heart size and vascular pattern are normal. No infiltrate or effusion.  IMPRESSION: No active disease.   Electronically Signed   By: Skipper Cliche M.D.   On: 01/09/2014 11:19      IMPRESSION: Recurrent endometrial cancer. The patient has a significant recurrence within the pelvis area with associated lymphadenopathy.  The initiation of  treatment has been complicated by the patient's psychiatric history, history of noncompliance and possible history of substance abuse. The patient's treatment has also been delayed in light of her most recent admission for acute diverticulitis. If we were to initiate radiation therapy to the pelvis at this time this would likely exacerbate the patient's diverticulitis symptoms and I would want to wait until she recovers more from this issue.  Today I discussed radiation therapy in general terms with the patient and her mother including external beam radiation therapy and possibly intracavitary brachytherapy treatments. We also discussed the benefits of radiosensitizing chemotherapy.  Patient does seem receptive to proceeding with treatment.  PLAN: Outpatient re-evaluation next week with treatments to begin approximately 2 weeks from now assuming the patient continues to recover from her diverticulitis.     ------------------------------------------------  Blair Promise, PhD, MD

## 2014-01-14 NOTE — Progress Notes (Signed)
Clinical Social Work  CSW spoke with bedside RN who reports that patient has not been compliant with taking her Abilify. Patient reports she is here because of her pain and does not understand why others are concerned about her depression. Patient continued to ask CSW questions about her chemo and radiation treatment or prognosis. CSW encouraged patient to ask MD these questions but patient continued to ask CSW those questions. Patient reports she is feeling "fine" in re: to her emotional status but is worried about her pain medication and managing her pain once she DC home. Patient spoke to unit CSW re: SNF placement but patient reports she would feel most comfortable at home with mother and her pets. Patient agreeable for CSW to continue to follow.  [place], [place] [phone]

## 2014-01-14 NOTE — Progress Notes (Signed)
PT Cancellation Note  Patient Details Name: [name] MRN: [id number] DOB: 12-20-1955   Cancelled Treatment:    Reason Eval/Treat Not Completed:  Attempted PT evaluation-pt declined to participate with therapy on today. When asked if pt wanted [name] to check back later today, pt stated no. Will plan to check back on tomorrow if pt will participate.    Weston Anna, MPT Pager: [phone]

## 2014-01-14 NOTE — Progress Notes (Addendum)
Patient ID: [name], female   DOB: [birth year], 58 y.o.   MRN: [id number]  [facility] PROGRESS NOTE  [name] IRJ:[id number] DOB: 1955/11/14 DOA: 01/06/2014 PCP: [name], FNP  Brief narrative:  58 y.o. 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 She subsequently was scheduled to see Dr. Marko Plume on 12/28/13 but did not keep appointment. She presented to the ER 01/06/14 with a chief complaint of abdominal pain and was found to have acute sigmoid diverticulitis on CT.   Assessment/Plan:   Principal Problem:  Acute sigmoid diverticulitis / Fever  Urinalysis seen with moderate leukocytes but urine culture shows no growth. Blood cultures to date show no growth. CXR with no evidence of acute cardiopulmonary findings.  Diverticulitis seen on CT abdomen. Continue Cipro and flagyl but switch to PO regimen in anticipation of possible D/C in next 24 hours.  Continue pain management efforts: dilaudid 2 mg every 4 hours IV PRN severe pain, oxycodone 10 mg PO Q 4 hours PRN moderate pain. Active Problems:  Hypokalemia  Likely due to nausea and possible GI losses.  Potassium repleted and now WNL. Pain medication seeking behavior  Do not increase pain meds other than what the current dose is due to history of drug seeking behavior.  Pain previously managed at a pain clinic.  Will need to follow up with her pain management MD at discharge.  Behavior disturbance / Depression / Anxiety state  Patient has a history of possible polysubstance abuse, is on psychotropic medication, and has had  behavioral outbursts.  Continue Zoloft, and when necessary Ativan. Patient refused Abilify.  Evaluated by psychiatrist 01/08/14. Has capacity. Hypothyroidism  Continue Synthroid.  TSH WNL at 1.410. Endometrial ca  Outpatient followup with Dr. Marko Plume for further evaluation/treatment recommendations. No role for inpatient chemotherapy while the patient is undergoing treatment for an active diverticulitis.  Will see if there is a role for palliative radiation therapy to pelvic mass. DVT Prophylaxis  Continue Lovenox while in hospital.  Code Status: Full.  Family Communication: Mother updated at the bedside 01/11/14.  Disposition Plan: Home when stable. Possibly in 24 - 48 hours   IV Access:   Peripheral IV Procedures and diagnostic studies:    Ct Abdomen Pelvis W Contrast 01/06/2014: Slightly larger pelvic mass and relatively stable pelvic adenopathy. Acute sigmoid diverticulitis without complicating features.   Medical Consultants:   Psychiatry, Durward Parcel, MD  Oncology (Dr Evlyn Clines)  Radiation oncology   Other Consultants:   None.   Anti-Infectives:   Cipro 01/06/14--->  Flagyl 01/06/14--->  Leisa Lenz, MD  [facility] Pager [phone]  If 7PM-7AM, please contact night-coverage [email or web address] Password [email or web address] 01/14/2014, 12:01 PM   LOS: 8 days    HPI/Subjective: No acute overnight events.  Objective: Filed Vitals:   01/12/14 2100 01/13/14 0444 01/13/14 1459 01/13/14 2102  BP: 126/63 124/70 109/67 116/73  Pulse: 72 79 62 66  Temp:  98 F (36.7 C) 98.3 F (36.8 C) 98.6 F (37 C)  TempSrc: Oral Oral Oral Oral  Resp: 18 20 18 16   Height:      Weight:  SpO2: 98% 97% 97% 98%    Intake/Output Summary (Last 24 hours) at 01/14/14 1201 Last data filed at 01/14/14 0558  Gross per 24 hour  Intake    600 ml  Output    600 ml  Net      0 ml   Exam:   General:  Pt is alert, follows commands appropriately, not in acute  distress  Cardiovascular: Regular rate and rhythm, S1/S2, no murmurs  Respiratory: Clear to auscultation bilaterally, no wheezing, no crackles, no rhonchi  Abdomen: Stender in lower abdomen, bowel sounds present  Extremities: No edema, pulses DP and PT palpable bilaterally  Neuro: Grossly nonfocal  Data Reviewed: Basic Metabolic Panel:  Recent Labs Lab 01/08/14 0423 01/10/14 1210 01/11/14 0425 01/12/14 0555  NA 139 141 137 138  K 4.6 3.8 3.6* 4.1  CL 102 102 100 100  CO2 28 27 28 26   GLUCOSE 103* 91 88 96  BUN 6 7 5* 6  CREATININE 0.70 0.60 0.62 0.67  CALCIUM 8.5 8.2* 8.4 8.7   CBC:  Recent Labs Lab 01/08/14 0423 01/10/14 1210 01/11/14 0425  WBC 12.1* 9.5 9.3  HGB 11.6* 10.5* 10.8*  HCT 35.2* 31.5* 33.4*  MCV 88.7 87.7 88.8  PLT 166 222 231   Recent Results (from the past 240 hour(s))  URINE CULTURE     Status: None   Collection Time    01/07/14  2:04 AM      Result Value Ref Range Status   Specimen Description URINE, RANDOM   Final   Special Requests NONE   Final   Culture  Setup Time     Final   Value: 01/07/2014 10:34     Performed at [place]     Final   Value: NO GROWTH     Performed at [facility]   Culture     Final   Value: NO GROWTH     Performed at [facility]   Report Status 01/08/2014 FINAL   Final  CULTURE, BLOOD (ROUTINE X 2)     Status: None   Collection Time    01/09/14 11:22 AM      Result Value Ref Range Status   Specimen Description BLOOD LEFT ARM   Final   Special Requests BOTTLES DRAWN AEROBIC AND ANAEROBIC 10CC EACH   Final   Culture  Setup Time     Final   Value: 01/09/2014 14:44     Performed at [facility]   Culture     Final   Value:        BLOOD CULTURE RECEIVED NO GROWTH TO DATE CULTURE WILL BE HELD FOR 5 DAYS BEFORE ISSUING A FINAL NEGATIVE REPORT     Performed at [facility]   Report Status PENDING   Incomplete  CULTURE, BLOOD (ROUTINE X 2)     Status:  None   Collection Time    01/09/14 11:27 AM      Result Value Ref Range Status   Specimen Description BLOOD LEFT HAND   Final   Special Requests BOTTLES DRAWN AEROBIC AND ANAEROBIC 5CC EACH   Final   Culture  Setup Time     Final   Value: 01/09/2014 14:44     Performed at [facility]   Culture     Final   Value:        BLOOD CULTURE RECEIVED NO GROWTH TO DATE CULTURE WILL BE HELD FOR 5  DAYS BEFORE ISSUING A FINAL NEGATIVE REPORT     Performed at [facility]   Report Status PENDING   Incomplete  URINE CULTURE     Status: None   Collection Time    01/09/14  2:19 PM      Result Value Ref Range Status   Specimen Description URINE, CLEAN CATCH   Final   Special Requests NONE   Final   Culture  Setup Time     Final   Value: 01/10/2014 04:25     Performed at [place]     Final   Value: NO GROWTH     Performed at [facility]   Culture     Final   Value: NO GROWTH     Performed at [facility]   Report Status 01/11/2014 FINAL   Final     Scheduled Meds: . antiseptic oral rinse  7 mL Mouth Rinse q12n4p  . ARIPiprazole  10 mg Oral BID  . chlorhexidine  15 mL Mouth Rinse BID  . ciprofloxacin  500 mg Oral BID  . docusate sodium  100 mg Oral BID  . levothyroxine  125 mcg Oral QAC breakfast  . LORazepam  1 mg Oral BID  . metroNIDAZOLE  500 mg Oral 3 times per day  . polyethylene glycol  17 g Oral Daily  . sertraline  200 mg Oral QPM  . sodium chloride  3 mL Intravenous Q12H   Continuous Infusions:

## 2014-01-15 ENCOUNTER — Telehealth: Payer: Self-pay | Admitting: Oncology

## 2014-01-15 DIAGNOSIS — I10 Essential (primary) hypertension: Secondary | ICD-10-CM

## 2014-01-15 LAB — CULTURE, BLOOD (ROUTINE X 2)
Culture: NO GROWTH
Culture: NO GROWTH

## 2014-01-15 MED ORDER — ARIPIPRAZOLE 10 MG PO TABS: 10.0000 mg | ORAL_TABLET | Freq: Two times a day (BID) | ORAL | Status: DC

## 2014-01-15 MED ORDER — CIPROFLOXACIN HCL 500 MG PO TABS: 500.0000 mg | ORAL_TABLET | Freq: Two times a day (BID) | ORAL | Status: DC

## 2014-01-15 MED ORDER — OXYCODONE HCL 10 MG PO TABS: 10.0000 mg | ORAL_TABLET | Freq: Four times a day (QID) | ORAL | Status: DC | PRN

## 2014-01-15 MED ORDER — HYDROMORPHONE HCL 1 MG/ML IJ SOLN
1.0000 mg | Freq: Once | INTRAMUSCULAR | Status: AC
Start: 2014-01-15 — End: 2014-01-15
  Administered 2014-01-15: 1 mg via INTRAVENOUS
  Filled 2014-01-15: qty 1

## 2014-01-15 MED ORDER — METRONIDAZOLE 500 MG PO TABS: 500.0000 mg | ORAL_TABLET | Freq: Three times a day (TID) | ORAL | Status: DC

## 2014-01-15 MED ORDER — ALBUTEROL SULFATE HFA 108 (90 BASE) MCG/ACT IN AERS: 2.0000 | INHALATION_SPRAY | RESPIRATORY_TRACT | Status: AC | PRN

## 2014-01-15 MED ORDER — ACETAMINOPHEN 325 MG PO TABS: 650.0000 mg | ORAL_TABLET | Freq: Four times a day (QID) | ORAL | Status: DC | PRN

## 2014-01-15 MED ORDER — CETYLPYRIDINIUM CHLORIDE 0.05 % MT LIQD: 7.0000 mL | Freq: Two times a day (BID) | OROMUCOSAL | Status: DC

## 2014-01-15 MED ORDER — POLYETHYLENE GLYCOL 3350 17 G PO PACK: 17.0000 g | PACK | Freq: Every day | ORAL | Status: DC

## 2014-01-15 MED ORDER — LEVOTHYROXINE SODIUM 125 MCG PO TABS: 125.0000 ug | ORAL_TABLET | Freq: Every day | ORAL | Status: DC

## 2014-01-15 MED ORDER — DSS 100 MG PO CAPS: 100.0000 mg | ORAL_CAPSULE | Freq: Two times a day (BID) | ORAL | Status: DC | PRN

## 2014-01-15 MED ORDER — KETOROLAC TROMETHAMINE 30 MG/ML IJ SOLN
30.0000 mg | Freq: Once | INTRAMUSCULAR | Status: AC
  Administered 2014-01-15: 30 mg via INTRAVENOUS
  Filled 2014-01-15: qty 1

## 2014-01-15 MED ORDER — CHLORHEXIDINE GLUCONATE 0.12 % MT SOLN: 15.0000 mL | Freq: Two times a day (BID) | OROMUCOSAL | Status: DC

## 2014-01-15 MED ORDER — ONDANSETRON HCL 4 MG PO TABS: 4.0000 mg | ORAL_TABLET | Freq: Four times a day (QID) | ORAL | Status: DC | PRN

## 2014-01-15 NOTE — Discharge Summary (Signed)
Physician Discharge Summary  [name] YQM:[id number] DOB: 1955/04/29 DOA: 01/06/2014  PCP: [name], FNP  Admit date: 01/06/2014 Discharge date: 01/15/2014  Recommendations for Outpatient Follow-up:  Cipro and Flagyl for 5 more days on discharge. This will complete a total of 2 weeks of treatment for acute diverticulitis. I have scheduled an appt with Dr. Marko Plume for patient on 01/25/2014.   Discharge Diagnoses:  Principal Problem:   Diverticulitis Active Problems:   Hypothyroidism   Endometrial ca   Depression   Anxiety state   Behavior disturbance   Discharge Condition: stable   Diet recommendation: as tolerated   History of present illness:  58 y.o. 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 She subsequently was scheduled to see Dr. Marko Plume on 12/28/13 but did not keep appointment. She presented to the ER 01/06/14 with a chief complaint of abdominal pain and was found to have acute sigmoid diverticulitis on CT.   Assessment/Plan:   Principal Problem:  Acute sigmoid diverticulitis / Fever  Urinalysis seen with moderate leukocytes but urine culture shows no growth. Blood cultures to date show no growth. CXR with no evidence of acute cardiopulmonary findings.  Diverticulitis seen on CT abdomen. Continue Cipro and flagyl for 5 more days on discharge which would complete total of 2 weeks treatment for diverticulitis.  Continue pain management efforts: prescription provided only for oxycodone, not dilaudid. Active Problems:  Hypokalemia  Likely due to nausea and possible GI  losses.  Potassium repleted and now WNL. Pain medication seeking behavior  History of drug seeking behavior.  Pain previously managed at a pain clinic.  Will need to follow up with her pain management MD at discharge.  Behavior disturbance / Depression / Anxiety state  Patient has a history of possible polysubstance abuse, is on psychotropic medication, and has had behavioral outbursts.  Continue Zoloft, and when necessary Ativan. Patient refused Abilify.  Evaluated by psychiatrist 01/08/14. Has capacity. Hypothyroidism  Continue Synthroid.  TSH WNL at 1.410. Endometrial ca  Outpatient followup with Dr. Marko Plume for further evaluation/treatment recommendations. No role for inpatient chemotherapy while the patient is undergoing treatment for an active diverticulitis.  Palliative radiotherapy will be done once complete resolution of diverticulitis. This will be done on outpatient basis. DVT Prophylaxis  Continue Lovenox while in hospital.   Code Status: Full.  Family Communication: Mother updated at the bedside 01/11/14.   IV Access:   Peripheral IV Procedures and diagnostic studies:   Ct Abdomen Pelvis W Contrast 01/06/2014: Slightly larger pelvic mass and relatively stable pelvic adenopathy. Acute sigmoid diverticulitis without complicating features.   Medical Consultants:   Psychiatry, Durward Parcel, MD  Oncology (Dr Evlyn Clines)  Radiation oncology   Other Consultants:   None.   Anti-Infectives:   Cipro 01/06/14---> for 5 more days on discharge Flagyl 01/06/14---> for 5 more days on discharge    Signed:  Leisa Lenz, MD  [facility] 01/15/2014, 11:29 AM  Pager #: [phone]  Discharge Exam: Filed Vitals:   01/14/14 1346  BP: 116/56  Pulse: 55  Temp: 98.7 F (37.1 C)  Resp: 16   Filed Vitals:   01/13/14  2831 01/13/14 1459 01/13/14 2102 01/14/14 1346  BP: 124/70 109/67 116/73 116/56  Pulse: 79 62 66 55  Temp: 98 F (36.7 C) 98.3  F (36.8 C) 98.6 F (37 C) 98.7 F (37.1 C)  TempSrc: Oral Oral Oral Oral  Resp: 20 18 16 16   Height:      Weight:      SpO2: 97% 97% 98% 100%    General: Pt is alert, follows commands appropriately, not in acute distress Cardiovascular: Regular rate and rhythm, S1/S2 +, no murmurs Respiratory: Clear to auscultation bilaterally, no wheezing, no crackles, no rhonchi Abdominal: Soft, non tender, non distended, bowel sounds +, no guarding Extremities: no edema, no cyanosis, pulses palpable bilaterally DP and PT Neuro: Grossly nonfocal  Discharge Instructions  Discharge Instructions   Call MD for:  difficulty breathing, headache or visual disturbances    Complete by:  As directed      Call MD for:  persistant dizziness or light-headedness    Complete by:  As directed      Call MD for:  persistant nausea and vomiting    Complete by:  As directed      Call MD for:  redness, tenderness, or signs of infection (pain, swelling, redness, odor or green/yellow discharge around incision site)    Complete by:  As directed      Diet - low sodium heart healthy    Complete by:  As directed      Discharge instructions    Complete by:  As directed   Cipro and Flagyl for 5 more days on discharge. This will complete a total of 2 weeks of treatment for acute diverticulitis.  You were cared for by Dr. Leisa Lenz (a hospitalist) during your hospital stay. If you have any questions about your discharge medications or the care you received while you were in the hospital after you are discharged, you can call the unit and ask to speak with the hospitalist on call if the hospitalist that took care of you is not available. Once you are discharged, your primary care physician will handle any further medical issues. Please note that NO REFILLS for any discharge medications will be authorized once you are discharged, as it is imperative that you return to your primary care physician (or establish a relationship  with a primary care physician if you do not have one) for your aftercare needs so that they can reassess your need for medications and monitor your lab values. Any outstanding tests can be reviewed by your PCP at your follow up visit. It is also important to review any medicine changes with your PCP. Please bring these d/c instructions with you to your next visit so your physician can review these changes with you.  If you do not have a primary care physician, you can call [phone] for a physician referral. It is highly recommended that you obtain a PCP for hospital follow up.     Increase activity slowly    Complete by:  As directed             Medication List         acetaminophen 325 MG tablet  Commonly known as:  TYLENOL  Take 2 tablets (650 mg total) by mouth every 6 (six) hours as needed for fever or mild pain.     albuterol 108 (90 BASE) MCG/ACT inhaler  Commonly known as:  VENTOLIN HFA  Inhale 2 puffs into the lungs every 4 (four) hours as needed  for wheezing or shortness of breath.     antiseptic oral rinse 0.05 % Liqd solution  Commonly known as:  CPC / CETYLPYRIDINIUM CHLORIDE 0.05%  7 mLs by Mouth Rinse route 2 times daily at 12 noon and 4 pm.     ARIPiprazole 10 MG tablet  Commonly known as:  ABILIFY  Take 1 tablet (10 mg total) by mouth 2 (two) times daily.     chlorhexidine 0.12 % solution  Commonly known as:  PERIDEX  15 mLs by Mouth Rinse route 2 (two) times daily.     ciprofloxacin 500 MG tablet  Commonly known as:  CIPRO  Take 1 tablet (500 mg total) by mouth 2 (two) times daily.     DSS 100 MG Caps  Take 100 mg by mouth 2 (two) times daily as needed for mild constipation.     estradiol 0.5 MG tablet  Commonly known as:  ESTRACE  Take 1 tablet (0.5 mg total) by mouth every evening.     levothyroxine 125 MCG tablet  Commonly known as:  SYNTHROID, LEVOTHROID  Take 1 tablet (125 mcg total) by mouth daily before breakfast.     LORazepam 1 MG tablet   Commonly known as:  ATIVAN  Take 1 tablet (1 mg total) by mouth 2 (two) times daily.     metroNIDAZOLE 500 MG tablet  Commonly known as:  FLAGYL  Take 1 tablet (500 mg total) by mouth every 8 (eight) hours.     ondansetron 4 MG tablet  Commonly known as:  ZOFRAN  Take 1 tablet (4 mg total) by mouth every 6 (six) hours as needed for nausea.     Oxycodone HCl 10 MG Tabs  Take 1 tablet (10 mg total) by mouth every 6 (six) hours as needed for severe pain.     polyethylene glycol packet  Commonly known as:  MIRALAX / GLYCOLAX  Take 17 g by mouth daily.     sertraline 100 MG tablet  Commonly known as:  ZOLOFT  Take 200 mg by mouth every evening.     thiothixene 2 MG capsule  Commonly known as:  NAVANE  Take 1 capsule (2 mg total) by mouth at bedtime.     Vitamin D (Ergocalciferol) 50000 UNITS Caps capsule  Commonly known as:  DRISDOL  Take 50,000 Units by mouth every 7 (seven) days. No specific days           Follow-up Information   Follow up with Gordy Levan, MD On 01/25/2014. (at 12:30 pm)    Specialty:  Oncology   Contact information:   [place] [address] [phone]        The results of significant diagnostics from this hospitalization (including imaging, microbiology, ancillary and laboratory) are listed below for reference.    Significant Diagnostic Studies: Ct Abdomen Pelvis W Contrast  01/06/2014   CLINICAL DATA:  Severe lower pelvic pain which began today. Patient has a history of endometrial cancer.  EXAM: CT ABDOMEN AND PELVIS WITH CONTRAST  TECHNIQUE: Multidetector CT imaging of the abdomen and pelvis was performed using the standard protocol following bolus administration of intravenous contrast.  CONTRAST:  77mL OMNIPAQUE IOHEXOL 300 MG/ML SOLN, 130mL OMNIPAQUE IOHEXOL 300 MG/ML SOLN  COMPARISON:  12/12/2013  FINDINGS: Lower chest: The lung bases are clear of acute process. No pleural effusion or pulmonary lesions. The heart is  normal in size. No pericardial effusion. The distal esophagus and aorta are unremarkable.  Hepatobiliary: Mild diffuse  fatty infiltration of the liver but no focal hepatic lesions or intrahepatic biliary dilatation. The gallbladder is normal. No common bowel duct dilatation.  Pancreas: Normal.  Spleen: Stable splenomegaly.  Adrenals/Urinary Tract: Normal.  Stomach/Bowel: The stomach, duodenum, and small bowel are unremarkable. The terminal ileum is normal. The colon is normal except for focal acute diverticulitis involving the mid sigmoid colon. There is tumor surrounding the colon at this level but this appears to be an acute inflammatory process.  Vascular/Lymphatic: The aorta and branch vessels are patent. The major venous structures are patent. There are several small scattered mesenteric and retroperitoneal lymph nodes which appear relatively stable. Stable enlarged pelvic sidewall lymphadenopathy bilaterally. The necrotic locking pelvic mass is slightly larger. It previously measured a maximum of 7 x 3.7 cm and now measures 7.8 x 4.0 cm.  Reproductive: The uterus and ovaries are surgically absent. The bladder appears grossly normal. Could not exclude involvement of the left bladder wall by the pelvic mass.  Other:  Musculoskeletal: No significant findings.  IMPRESSION: Slightly larger pelvic mass and relatively stable pelvic adenopathy.  Acute sigmoid diverticulitis without complicating features.   Electronically Signed   By: Kalman Jewels M.D.   On: 01/06/2014 20:03   Dg Chest Port 1 View  01/09/2014   CLINICAL DATA:  Initial evaluation for fever, personal history of hypertension asthma and endometrial cancer  EXAM: PORTABLE CHEST - 1 VIEW  COMPARISON:  12/26/2012  FINDINGS: Heart size and vascular pattern are normal. No infiltrate or effusion.  IMPRESSION: No active disease.   Electronically Signed   By: Skipper Cliche M.D.   On: 01/09/2014 11:19    Microbiology: Recent Results (from the past 240  hour(s))  URINE CULTURE     Status: None   Collection Time    01/07/14  2:04 AM      Result Value Ref Range Status   Specimen Description URINE, RANDOM   Final   Special Requests NONE   Final   Culture  Setup Time     Final   Value: 01/07/2014 10:34     Performed at [place]     Final   Value: NO GROWTH     Performed at [facility]   Culture     Final   Value: NO GROWTH     Performed at [facility]   Report Status 01/08/2014 FINAL   Final  CULTURE, BLOOD (ROUTINE X 2)     Status: None   Collection Time    01/09/14 11:22 AM      Result Value Ref Range Status   Specimen Description BLOOD LEFT ARM   Final   Special Requests BOTTLES DRAWN AEROBIC AND ANAEROBIC 10CC EACH   Final   Culture  Setup Time     Final   Value: 01/09/2014 14:44     Performed at [facility]   Culture     Final   Value: NO GROWTH 5 DAYS     Performed at [facility]   Report Status 01/15/2014 FINAL   Final  CULTURE, BLOOD (ROUTINE X 2)     Status: None   Collection Time    01/09/14 11:27 AM      Result Value Ref Range Status   Specimen Description BLOOD LEFT HAND   Final   Special Requests BOTTLES DRAWN AEROBIC AND ANAEROBIC [facility] EACH   Final   Culture  Setup Time     Final  Value: 01/09/2014 14:44     Performed at [facility]   Culture     Final   Value: NO GROWTH 5 DAYS     Performed at [facility]   Report Status 01/15/2014 FINAL   Final  URINE CULTURE     Status: None   Collection Time    01/09/14  2:19 PM      Result Value Ref Range Status   Specimen Description URINE, CLEAN CATCH   Final   Special Requests NONE   Final   Culture  Setup Time     Final   Value: 01/10/2014 04:25     Performed at [place]     Final   Value: NO GROWTH     Performed at [facility]   Culture     Final   Value: NO GROWTH     Performed at [facility]   Report Status 01/11/2014 FINAL    Final     Labs: Basic Metabolic Panel:  Recent Labs Lab 01/10/14 1210 01/11/14 0425 01/12/14 0555  NA 141 137 138  K 3.8 3.6* 4.1  CL 102 100 100  CO2 27 28 26   GLUCOSE 91 88 96  BUN 7 5* 6  CREATININE 0.60 0.62 0.67  CALCIUM 8.2* 8.4 8.7   Liver Function Tests: No results found for this basename: AST, ALT, ALKPHOS, BILITOT, PROT, ALBUMIN,  in the last 168 hours No results found for this basename: LIPASE, AMYLASE,  in the last 168 hours No results found for this basename: AMMONIA,  in the last 168 hours CBC:  Recent Labs Lab 01/10/14 1210 01/11/14 0425  WBC 9.5 9.3  HGB 10.5* 10.8*  HCT 31.5* 33.4*  MCV 87.7 88.8  PLT 222 231   Cardiac Enzymes: No results found for this basename: CKTOTAL, CKMB, CKMBINDEX, TROPONINI,  in the last 168 hours BNP: BNP (last 3 results) No results found for this basename: PROBNP,  in the last 8760 hours CBG: No results found for this basename: GLUCAP,  in the last 168 hours  Time coordinating discharge: Over 30 minutes

## 2014-01-15 NOTE — Telephone Encounter (Signed)
S/W DR. Olive Bass  AND GAVE HOSP F/U APPT FOR 10/30 @ 12:30 W/DR. LIVESAY, CHEMO EDU 10/29 @ 12:30.

## 2014-01-15 NOTE — Progress Notes (Signed)
PT Cancellation Note  Patient Details Name: [name] MRN: [id number] DOB: 08/25/1955   Cancelled Treatment:    Reason Eval/Treat Not Completed: PT screened, no needs identified, will sign off. spoke briefly with pt who denied need for therapy services. Will sign off. thanks.    Weston Anna, MPT Pager: [phone]

## 2014-01-15 NOTE — Discharge Instructions (Signed)

## 2014-01-15 NOTE — Progress Notes (Signed)
Patient is complaining of severe pain in her abdominal area. She states she is not receiving any relief from doses of iv dilaudid. She is screaming and crying that nobody believes her about her pain and not helping her. Hospitalist paged and made aware of situation. A one time extra dose of 1 mg iv dilaudid and a one time dose of toradol ordered and given.

## 2014-01-17 ENCOUNTER — Telehealth: Payer: Self-pay

## 2014-01-17 NOTE — Telephone Encounter (Signed)
[name] wanted to know if she should resume the estradiol.  It was stopped while in the hospital. She does continue with hot flashes.  Told [name] that Dr. [name] would prefer her not to take the estradiol with her cancer.  Pt. will  not restart the medication as she does not want to do anything to make the cancer worse.

## 2014-01-18 ENCOUNTER — Encounter: Payer: Self-pay | Admitting: *Deleted

## 2014-01-18 ENCOUNTER — Telehealth: Payer: Self-pay | Admitting: Oncology

## 2014-01-18 NOTE — Progress Notes (Signed)
[place] Work  Clinical Social Work phoned pt's house several times without success. CSW was able to get in contact with pt's mother to assess transportation concerns. Pt actually is residing in [place], not [facility] and her mother is staying with her. CSW attempted to explain transportation options to pt's mother, but she did not quite understand the various options available. Pt not willing to talk with CSW on the phone as she was not feeling well. CSW will follow up with pt on Monday in attempt to discuss transportation options. Pt should have many due to residing in [place] limits.    Clinical Social Work interventions: Resource education  Loren Racer, [place] Worker [place]  [place] Phone: [phone] Fax: [phone]

## 2014-01-18 NOTE — Telephone Encounter (Signed)
Harrold Donath, Clinical Social Worker regarding arranging transportation for [employer] consult and sim for 01/25/14.  Abby Potash said she would check on this.

## 2014-01-18 NOTE — Progress Notes (Signed)
[place] Work  Clinical Social Work was referred by Elmo Putt, nurse for assessment of psychosocial needs due to possible transportation concerns.  Clinical Social Worker has made several attempts to contact patient at home this am to offer support and assess for needs.  However, phone numbers in EPIC appear to not be working currently. CSW inpt appears to have provided pt and family with resource information for transportation assistance. [facility] transportation [phone] should be able to transport pt to appointments or assist with this concern. CSW will continue to try to reach pt, but may have to meet with pt at first appt, if phone continues to not work.   Loren Racer, [place] Worker [place]  [place] Phone: [phone] Fax: [phone]

## 2014-01-22 ENCOUNTER — Ambulatory Visit
Admission: RE | Admit: 2014-01-22 | Discharge: 2014-01-22 | Disposition: A | Discharge status: Home / Self Care | Payer: Medicaid Other | Source: Ambulatory Visit | Attending: Radiation Oncology | Admitting: Radiation Oncology

## 2014-01-22 ENCOUNTER — Encounter: Payer: Self-pay | Admitting: *Deleted

## 2014-01-22 ENCOUNTER — Other Ambulatory Visit: Payer: Self-pay | Admitting: Oncology

## 2014-01-22 DIAGNOSIS — C541 Malignant neoplasm of endometrium: Secondary | ICD-10-CM | POA: Insufficient documentation

## 2014-01-22 DIAGNOSIS — Z510 Encounter for antineoplastic radiation therapy: Secondary | ICD-10-CM | POA: Insufficient documentation

## 2014-01-22 MED ORDER — ONDANSETRON HCL 4 MG PO TABS: 4.0000 mg | ORAL_TABLET | Freq: Four times a day (QID) | ORAL | Status: DC | PRN

## 2014-01-22 NOTE — Progress Notes (Signed)
[place] Work  Holiday representative met with patient in radiation oncology to offer support and discuss transportation needs.  CSW, patient,and patients friend discussed available transportation resources including Medicaid transportation and [name].  CSW and patient reviewed [name] application and completed the patient portion.  CSW Polo Riley will follow up with patient regarding [name] eligibility and Medicaid transportation.  [name], MSW, LCSW, OSW-C Clinical Social Worker [facility] [phone]

## 2014-01-22 NOTE — Progress Notes (Signed)
Patient here for nurse eval.  She reports pain in her right lower pelvic area that radiates to her lower back.  She is taking oxycodone 10 mg once a day.  She reports feeling sick in the mornings after taking her pain medication.  Advised her to try eating something before taking her pain medication.

## 2014-01-23 ENCOUNTER — Encounter (HOSPITAL_COMMUNITY): Payer: Self-pay | Admitting: Emergency Medicine

## 2014-01-23 ENCOUNTER — Telehealth: Payer: Self-pay | Admitting: *Deleted

## 2014-01-23 ENCOUNTER — Emergency Department (HOSPITAL_COMMUNITY)
Admission: EM | Admit: 2014-01-23 | Discharge: 2014-01-24 | Disposition: A | Discharge status: Home / Self Care | Payer: Medicaid Other | Attending: Emergency Medicine | Admitting: Emergency Medicine

## 2014-01-23 ENCOUNTER — Telehealth: Payer: Self-pay | Admitting: Oncology

## 2014-01-23 DIAGNOSIS — Z510 Encounter for antineoplastic radiation therapy: Secondary | ICD-10-CM | POA: Diagnosis not present

## 2014-01-23 DIAGNOSIS — M6281 Muscle weakness (generalized): Secondary | ICD-10-CM | POA: Diagnosis not present

## 2014-01-23 DIAGNOSIS — N3000 Acute cystitis without hematuria: Secondary | ICD-10-CM | POA: Insufficient documentation

## 2014-01-23 DIAGNOSIS — Z792 Long term (current) use of antibiotics: Secondary | ICD-10-CM | POA: Diagnosis not present

## 2014-01-23 DIAGNOSIS — Z8542 Personal history of malignant neoplasm of other parts of uterus: Secondary | ICD-10-CM | POA: Diagnosis not present

## 2014-01-23 DIAGNOSIS — G4730 Sleep apnea, unspecified: Secondary | ICD-10-CM | POA: Diagnosis not present

## 2014-01-23 DIAGNOSIS — E079 Disorder of thyroid, unspecified: Secondary | ICD-10-CM | POA: Insufficient documentation

## 2014-01-23 DIAGNOSIS — R531 Weakness: Secondary | ICD-10-CM

## 2014-01-23 DIAGNOSIS — J45909 Unspecified asthma, uncomplicated: Secondary | ICD-10-CM | POA: Diagnosis not present

## 2014-01-23 DIAGNOSIS — Z9981 Dependence on supplemental oxygen: Secondary | ICD-10-CM | POA: Diagnosis not present

## 2014-01-23 DIAGNOSIS — Z79899 Other long term (current) drug therapy: Secondary | ICD-10-CM | POA: Diagnosis not present

## 2014-01-23 DIAGNOSIS — G8929 Other chronic pain: Secondary | ICD-10-CM | POA: Insufficient documentation

## 2014-01-23 DIAGNOSIS — F419 Anxiety disorder, unspecified: Secondary | ICD-10-CM | POA: Diagnosis not present

## 2014-01-23 DIAGNOSIS — E669 Obesity, unspecified: Secondary | ICD-10-CM | POA: Diagnosis not present

## 2014-01-23 DIAGNOSIS — M1990 Unspecified osteoarthritis, unspecified site: Secondary | ICD-10-CM | POA: Insufficient documentation

## 2014-01-23 DIAGNOSIS — I10 Essential (primary) hypertension: Secondary | ICD-10-CM | POA: Diagnosis not present

## 2014-01-23 DIAGNOSIS — R109 Unspecified abdominal pain: Secondary | ICD-10-CM | POA: Diagnosis present

## 2014-01-23 NOTE — ED Notes (Signed)
Bed: JY78 Expected date:  Expected time:  Means of arrival:  Comments: EMS 58yo F abd pain x 1 week

## 2014-01-23 NOTE — ED Notes (Signed)
Brought in by EMS from home with c/o abdominal pain.  Pt reported that she has been having abdominal pain for a week now, has nausea but no vomiting or diarrhea.  Pt reports that pain is getting progressively worse.  Pt has hx of endometrial cancer and has had hysterectomy.

## 2014-01-23 NOTE — Progress Notes (Signed)
  Radiation Oncology         ([phone]) [phone] ________________________________  Name: [name] MRN: [id number]  Date: 01/22/2014  DOB: [birth year]  SIMULATION AND TREATMENT PLANNING NOTE    ICD-9-CM ICD-10-CM  1. Endometrial ca 182.0 C54.1    DIAGNOSIS:  Recurrent endometrial cancer  NARRATIVE:  The patient was brought to the [facility].  Identity was confirmed.  All relevant records and images related to the planned course of therapy were reviewed.  The patient freely provided informed written consent to proceed with treatment after reviewing the details related to the planned course of therapy. The consent form was witnessed and verified by the simulation staff.  Then, the patient was set-up in a stable reproducible  supine position for radiation therapy.  CT images were obtained.  Surface markings were placed.  The CT images were loaded into the planning software.  Then the target and avoidance structures were contoured.  Treatment planning then occurred.  The radiation prescription was entered and confirmed.  Then, I designed and supervised the construction of a total of 5 medically necessary complex treatment devices.  I have requested : 3D Simulation  I have requested a DVH of the following structures: GTV, small bowel, rectum.  I have ordered:dose calc.  PLAN:  The patient will receive 45 Gy in 25 fractions along with radiosensitizing chemotherapy followed by a boost either brachytherapy or additional external beam radiation treatments.  ________________________________   Special treatment procedure note  The patient will be receiving radiosensitizing chemotherapy throughout her external beam treatments. Given the increased potential for toxicities as well as the necessity for close monitoring of the patient and bloodwork, this constitutes a special treatment procedure -----------------------------------  Blair Promise, PhD, MD

## 2014-01-23 NOTE — Telephone Encounter (Signed)
Pt left VM asking for clarification of appt times for this Thursday and Friday. Called pt back and let her know she is scheduled for the chemo class tomorrow and for the financial counselor and to see Dr. Marko Plume on Friday. Pt is concerned she doesn't have anyone to bring her tomorrow. Offered to reschedule for Friday am chemo class - pt states she cannot come to any appts before noon time as she has sleep apnea and is in a lot of pain. Told patient that we will keep appts as scheduled and to please try her best to find a ride for tomorrow. Pt states she will try and she already has a ride for Friday with her friend, Ronalee Belts.  Pt got emotional while on the phone. Listened and offered support. Let pt know that we are here to help her and that we really need to get her in to see Dr. Marko Plume to get appts scheduled and moving along. Pt very appreciative of the support and states "thanks for listening and helping me."

## 2014-01-23 NOTE — Telephone Encounter (Signed)
Per Donia Ast would like the prescription for Zofran called in to [facility] on [address].  Called in per Dr. [name] to [facility]: ondansetron (ZOFRAN) 4 MG tablet. Take 1 tablet (4 mg total) by mouth every 6 (six) hours as needed for nausea.  Disp. 20 tablets. 0 refills.

## 2014-01-24 ENCOUNTER — Encounter: Payer: Self-pay | Admitting: *Deleted

## 2014-01-24 ENCOUNTER — Other Ambulatory Visit: Payer: Medicaid Other

## 2014-01-24 DIAGNOSIS — Z510 Encounter for antineoplastic radiation therapy: Secondary | ICD-10-CM | POA: Diagnosis not present

## 2014-01-24 LAB — URINALYSIS, ROUTINE W REFLEX MICROSCOPIC
Bilirubin Urine: NEGATIVE
Glucose, UA: NEGATIVE mg/dL
KETONES UR: NEGATIVE mg/dL
NITRITE: NEGATIVE
Protein, ur: NEGATIVE mg/dL
Specific Gravity, Urine: 1.011 (ref 1.005–1.030)
UROBILINOGEN UA: 0.2 mg/dL (ref 0.0–1.0)
pH: 5 (ref 5.0–8.0)

## 2014-01-24 LAB — CBC WITH DIFFERENTIAL/PLATELET
Basophils Absolute: 0.1 10*3/uL (ref 0.0–0.1)
Basophils Relative: 1 % (ref 0–1)
EOS ABS: 0.3 10*3/uL (ref 0.0–0.7)
Eosinophils Relative: 2 % (ref 0–5)
HCT: 41.6 % (ref 36.0–46.0)
HEMOGLOBIN: 14.5 g/dL (ref 12.0–15.0)
Lymphocytes Relative: 41 % (ref 12–46)
Lymphs Abs: 4.8 10*3/uL — ABNORMAL HIGH (ref 0.7–4.0)
MCH: 30.1 pg (ref 26.0–34.0)
MCHC: 34.9 g/dL (ref 30.0–36.0)
MCV: 86.3 fL (ref 78.0–100.0)
Monocytes Absolute: 0.6 10*3/uL (ref 0.1–1.0)
Monocytes Relative: 6 % (ref 3–12)
NEUTROS ABS: 5.8 10*3/uL (ref 1.7–7.7)
NEUTROS PCT: 50 % (ref 43–77)
PLATELETS: 338 10*3/uL (ref 150–400)
RBC: 4.82 MIL/uL (ref 3.87–5.11)
RDW: 12.8 % (ref 11.5–15.5)
WBC: 11.6 10*3/uL — ABNORMAL HIGH (ref 4.0–10.5)

## 2014-01-24 LAB — URINE MICROSCOPIC-ADD ON

## 2014-01-24 LAB — BASIC METABOLIC PANEL
ANION GAP: 14 (ref 5–15)
BUN: 11 mg/dL (ref 6–23)
CHLORIDE: 95 meq/L — AB (ref 96–112)
CO2: 24 mEq/L (ref 19–32)
Calcium: 9.4 mg/dL (ref 8.4–10.5)
Creatinine, Ser: 0.74 mg/dL (ref 0.50–1.10)
Glucose, Bld: 95 mg/dL (ref 70–99)
Potassium: 4.4 mEq/L (ref 3.7–5.3)
SODIUM: 133 meq/L — AB (ref 137–147)

## 2014-01-24 MED ORDER — NITROFURANTOIN MONOHYD MACRO 100 MG PO CAPS
100.0000 mg | ORAL_CAPSULE | Freq: Once | ORAL | Status: AC
  Administered 2014-01-24: 100 mg via ORAL
  Filled 2014-01-24: qty 1

## 2014-01-24 MED ORDER — THERA VITAL M PO TABS: 1.0000 | ORAL_TABLET | Freq: Every day | ORAL | Status: DC

## 2014-01-24 MED ORDER — SODIUM CHLORIDE 0.9 % IV BOLUS (SEPSIS)
1000.0000 mL | Freq: Once | INTRAVENOUS | Status: AC
  Administered 2014-01-24: 1000 mL via INTRAVENOUS

## 2014-01-24 MED ORDER — NITROFURANTOIN MONOHYD MACRO 100 MG PO CAPS: 100.0000 mg | ORAL_CAPSULE | Freq: Two times a day (BID) | ORAL | Status: DC

## 2014-01-24 NOTE — Progress Notes (Signed)
[place] Work  Clinical Social Work contacted patient by phone to follow up on completing [name] application for transportation assistance.  [name] shared she is feeling "very weak today", "I don't think I can ride a [place]", and " I just don't think I can do all these treatments".  CSW encouraged patient to focus on the present and "take it day by day".  Patient easily overwhelmed with fear of the unknown, amount of appointments required, and currently physically feeling weak.  Ms. Michelle agreed to complete [name] application to provide patient an option if she chooses to use [name] in the future.  The patient shared she is having trouble preparing meals and completing daily living activities.  The patient was interested in going to SNF while in treatment.  CSW shared with patient she is unsure if she qualifies for SNF but will follow up with patient once she's communicated with [facility] CSW and medical team.  If she does not qualify for SNF, CSW will request medical oncology request necessity for home health services (RN, PT, SW).  CSW will continue to follow patient throughout cancer journey.   Polo Riley, MSW, LCSW, OSW-C Clinical Social Worker [facility] [phone]

## 2014-01-24 NOTE — ED Notes (Signed)
Pt ambulated to the bathroom with no distress, stand by assist provided by Probation officer

## 2014-01-24 NOTE — ED Provider Notes (Signed)
CSN: [id number]     Arrival date & time 01/23/14  2310 History   First MD Initiated Contact with Patient 01/23/14 2349     Chief Complaint  Patient presents with  . Abdominal Pain     (Consider location/radiation/quality/duration/timing/severity/associated sxs/prior Treatment) HPI 58 year old female presents to emergency department via EMS from home with complaint of generalized weakness, lightheadedness, and inability to care for herself.  Patient recently diagnosed with pelvic mass, invasive adenocarcinoma.  Patient recently admitted for diverticulitis and has been awaiting recovery from that illness prior to starting chemotherapy and radiation.  Patient reports that she was offered a rehabilitation stay at discharge, which she refused.  Patient feels that her elderly mother cannot care for her appropriately, and she may need to go to a nursing facility where they can care for her around-the-clock.  Patient reports that she is able to walk around her apartment, but feels generally weak.  Tonight she had a period of lightheadedness.  Patient requesting medication to relieve her weakness.  She reports that taking her pain medicine makes her sleepy.  Patient has appointment with her oncologist on Friday to go over her chemotherapy regimen.  Patient is concerned that she is having difficulty getting back and forth to appointments.  Chart reviewed, patient was offered home health, physical therapy, and rides to and from the cancer center.  Patient has consistently refused all of these programs.  Patient reports she was told by a friend that she needs to go to a rehabilitation hospital while she is having her chemotherapy done. Past Medical History  Diagnosis Date  . Thyroid disease   . Arthritis   . Asthma   . Depression   . Fibromyalgia   . Lumbar spondylolysis   . Allergy   . Endometrial cancer   . Spinal stenosis   . Hypertension   . Dysrhythmia     OCCAS PAC'S AND PVC'S  PER HOLTER MONITOR  STUDY --PT HAS OCCAS CHEST PAINS AND PALPITATIONS--HAD ECHO NORMAL LV SIZE AND FUNCTION.  Marland Kitchen Sleep apnea     HAS CPAP MACHINE BUT DOES NOT USE  . Anxiety     HAS OCD AND ON DISABILITY-PER [name]   . Back pain, chronic   . Obese    Past Surgical History  Procedure Laterality Date  . Cesarean section    . Abdominal hysterectomy      total   Family History  Problem Relation Age of Onset  . Hypertension Mother   . Coronary artery disease Father   . Cancer Brother     Renal cell carcinoma  . Hypertension Other   . Cancer Other   . Heart attack Father   . Coronary artery disease Brother    History  Substance Use Topics  . Smoking status: Never Smoker   . Smokeless tobacco: Never Used  . Alcohol Use: No   OB History   Grav Para Term Preterm Abortions TAB SAB Ect Mult Living   1 1             Review of Systems   See History of Present Illness; otherwise all other systems are reviewed and negative  Allergies  Sulfa antibiotics and Sulfamethoxazole-trimethoprim  Home Medications   Prior to Admission medications   Medication Sig Start Date End Date Taking? Authorizing Provider  acetaminophen (TYLENOL) 325 MG tablet Take 2 tablets (650 mg total) by mouth every 6 (six) hours as needed for fever or mild pain. 01/15/14   Link Snuffer  Charlies Silvers, MD  albuterol (VENTOLIN HFA) 108 (90 BASE) MCG/ACT inhaler Inhale 2 puffs into the lungs every 4 (four) hours as needed for wheezing or shortness of breath. 01/15/14   Robbie Lis, MD  antiseptic oral rinse (CPC / CETYLPYRIDINIUM CHLORIDE 0.05%) 0.05 % LIQD solution 7 mLs by Mouth Rinse route 2 times daily at 12 noon and 4 pm. 01/15/14   Robbie Lis, MD  ARIPiprazole (ABILIFY) 10 MG tablet Take 1 tablet (10 mg total) by mouth 2 (two) times daily. 01/15/14   Robbie Lis, MD  chlorhexidine (PERIDEX) 0.12 % solution 15 mLs by Mouth Rinse route 2 (two) times daily. 01/15/14   Robbie Lis, MD  ciprofloxacin (CIPRO) 500 MG tablet Take 1  tablet (500 mg total) by mouth 2 (two) times daily. 01/15/14   Robbie Lis, MD  docusate sodium 100 MG CAPS Take 100 mg by mouth 2 (two) times daily as needed for mild constipation. 01/15/14   Robbie Lis, MD  estradiol (ESTRACE) 0.5 MG tablet Take 1 tablet (0.5 mg total) by mouth every evening. 11/06/13   Lahoma Crocker, MD  levothyroxine (SYNTHROID, LEVOTHROID) 125 MCG tablet Take 1 tablet (125 mcg total) by mouth daily before breakfast. 01/15/14   Robbie Lis, MD  LORazepam (ATIVAN) 1 MG tablet Take 1 tablet (1 mg total) by mouth 2 (two) times daily. 02/23/12   Shari A Upstill, PA-C  metroNIDAZOLE (FLAGYL) 500 MG tablet Take 1 tablet (500 mg total) by mouth every 8 (eight) hours. 01/15/14   Robbie Lis, MD  ondansetron (ZOFRAN) 4 MG tablet Take 1 tablet (4 mg total) by mouth every 6 (six) hours as needed for nausea. 01/22/14   Blair Promise, MD  oxyCODONE 10 MG TABS Take 1 tablet (10 mg total) by mouth every 6 (six) hours as needed for severe pain. 01/15/14   Robbie Lis, MD  polyethylene glycol (MIRALAX / Floria Raveling) packet Take 17 g by mouth daily. 01/15/14   Robbie Lis, MD  sertraline (ZOLOFT) 100 MG tablet Take 200 mg by mouth every evening.  10/15/11   Srikar Janna Arch, MD  thiothixene (NAVANE) 2 MG capsule Take 1 capsule (2 mg total) by mouth at bedtime. 03/07/12   Carlisle Cater, PA-C  Vitamin D, Ergocalciferol, (DRISDOL) 50000 UNITS CAPS capsule Take 50,000 Units by mouth every 7 (seven) days. No specific days    Historical Provider, MD   BP 124/72  Pulse 90  Temp(Src) 98.4 F (36.9 C) (Oral)  Resp 19  Ht 5' (1.524 m)  Wt 179 lb (81.194 kg)  BMI 34.96 kg/m2  SpO2 96% Physical Exam  Nursing note and vitals reviewed. Constitutional: She is oriented to person, place, and time. She appears well-developed and well-nourished. She appears distressed (patient is emotional, agitated at times appears frustrated).  HENT:  Head: Normocephalic and atraumatic.  Nose: Nose normal.   Mouth/Throat: Oropharynx is clear and moist.  Eyes: Conjunctivae and EOM are normal. Pupils are equal, round, and reactive to light.  Neck: Normal range of motion. Neck supple. No JVD present. No tracheal deviation present. No thyromegaly present.  Cardiovascular: Normal rate, regular rhythm, normal heart sounds and intact distal pulses.  Exam reveals no gallop and no friction rub.   No murmur heard. Pulmonary/Chest: Effort normal and breath sounds normal. No stridor. No respiratory distress. She has no wheezes. She has no rales. She exhibits no tenderness.  Abdominal: Soft. Bowel sounds are normal. She exhibits no distension and  no mass. There is tenderness (diffuse abdominal tenderness with palpation). There is no rebound and no guarding.  Musculoskeletal: Normal range of motion. She exhibits no edema and no tenderness.  Lymphadenopathy:    She has no cervical adenopathy.  Neurological: She is alert and oriented to person, place, and time. She displays normal reflexes. She exhibits normal muscle tone. Coordination normal.  Skin: Skin is warm and dry. No rash noted. No erythema. No pallor.  Psychiatric: She has a normal mood and affect. Her behavior is normal.  Patient appears to have poor insight and judgment    ED Course  Procedures (including critical care time) Labs Review Labs Reviewed  URINALYSIS, ROUTINE W REFLEX MICROSCOPIC - Abnormal; Notable for the following:    APPearance CLOUDY (*)    Hgb urine dipstick MODERATE (*)    Leukocytes, UA MODERATE (*)    All other components within normal limits  CBC WITH DIFFERENTIAL - Abnormal; Notable for the following:    WBC 11.6 (*)    Lymphs Abs 4.8 (*)    All other components within normal limits  BASIC METABOLIC PANEL - Abnormal; Notable for the following:    Sodium 133 (*)    Chloride 95 (*)    All other components within normal limits  URINE CULTURE  URINE MICROSCOPIC-ADD ON    Imaging Review No results found.   EKG  Interpretation None      MDM   Final diagnoses:  Acute cystitis without hematuria  Weakness   58 year old female with pelvic mass, adenocarcinoma, awaiting initiation of radiation and chemotherapy.  Patient reports generalized weakness.  We'll check labs, give IV fluids.  Patient clinically does not appear dehydrated.  Will check orthostatics.   Pt reports feeling much better after fluids.  Noted to have UTI, will send for culture, start macrobid.  Pt encouraged to f/u with oncology for help with outpatient resources   Kalman Drape, MD 01/24/14 [phone]

## 2014-01-24 NOTE — Discharge Instructions (Signed)
Please take medications as prescribed.  Discussed with your oncologist taking a multivitamin.  Follow-up as scheduled on Friday.  Eat small frequent meals during the day, consider adding boost or ensure nutritional supplements to help with your nutritional needs.  If you find that you need more help in the home, please contact your oncology team who can set you up with home health or physical therapy to help strengthen your body.   Urinary Tract Infection A urinary tract infection (UTI) can occur any place along the urinary tract. The tract includes the kidneys, ureters, bladder, and urethra. A type of germ called bacteria often causes a UTI. UTIs are often helped with antibiotic medicine.  HOME CARE   If given, take antibiotics as told by your doctor. Finish them even if you start to feel better.  Drink enough fluids to keep your pee (urine) clear or pale yellow.  Avoid tea, drinks with caffeine, and bubbly (carbonated) drinks.  Pee often. Avoid holding your pee in for a long time.  Pee before and after having sex (intercourse).  Wipe from front to back after you poop (bowel movement) if you are a woman. Use each tissue only once. GET HELP RIGHT AWAY IF:   You have back pain.  You have lower belly (abdominal) pain.  You have chills.  You feel sick to your stomach (nauseous).  You throw up (vomit).  Your burning or discomfort with peeing does not go away.  You have a fever.  Your symptoms are not better in 3 days. MAKE SURE YOU:   Understand these instructions.  Will watch your condition.  Will get help right away if you are not doing well or get worse. Document Released: 09/01/2007 Document Revised: 12/08/2011 Document Reviewed: 10/14/2011 [facility] Patient Information 2015 [place], [place]. This information is not intended to replace advice given to you by your health care provider. Make sure you discuss any questions you have with your health care  provider.  Weakness Weakness is a lack of strength. It may be felt all over the body (generalized) or in one specific part of the body (focal). Some causes of weakness can be serious. You may need further medical evaluation, especially if you are elderly or you have a history of immunosuppression (such as chemotherapy or HIV), kidney disease, heart disease, or diabetes. CAUSES  Weakness can be caused by many different things, including:  Infection.  Physical exhaustion.  Internal bleeding or other blood loss that results in a lack of red blood cells (anemia).  Dehydration. This cause is more common in elderly people.  Side effects or electrolyte abnormalities from medicines, such as pain medicines or sedatives.  Emotional distress, anxiety, or depression.  Circulation problems, especially severe peripheral arterial disease.  Heart disease, such as rapid atrial fibrillation, bradycardia, or heart failure.  Nervous system disorders, such as Guillain-Barr syndrome, multiple sclerosis, or stroke. DIAGNOSIS  To find the cause of your weakness, your caregiver will take your history and perform a physical exam. Lab tests or X-rays may also be ordered, if needed. TREATMENT  Treatment of weakness depends on the cause of your symptoms and can vary greatly. HOME CARE INSTRUCTIONS   Rest as needed.  Eat a well-balanced diet.  Try to get some exercise every day.  Only take over-the-counter or prescription medicines as directed by your caregiver. SEEK MEDICAL CARE IF:   Your weakness seems to be getting worse or spreads to other parts of your body.  You develop new aches or  pains. SEEK IMMEDIATE MEDICAL CARE IF:   You cannot perform your normal daily activities, such as getting dressed and feeding yourself.  You cannot walk up and down stairs, or you feel exhausted when you do so.  You have shortness of breath or chest pain.  You have difficulty moving parts of your body.  You  have weakness in only one area of the body or on only one side of the body.  You have a fever.  You have trouble speaking or swallowing.  You cannot control your bladder or bowel movements.  You have black or bloody vomit or stools. MAKE SURE YOU:  Understand these instructions.  Will watch your condition.  Will get help right away if you are not doing well or get worse. Document Released: 03/15/2005 Document Revised: 09/14/2011 Document Reviewed: 05/14/2011 [facility] Patient Information 2015 [place], [place]. This information is not intended to replace advice given to you by your health care provider. Make sure you discuss any questions you have with your health care provider.

## 2014-01-25 ENCOUNTER — Ambulatory Visit (HOSPITAL_BASED_OUTPATIENT_CLINIC_OR_DEPARTMENT_OTHER): Payer: Medicaid Other | Admitting: Oncology

## 2014-01-25 ENCOUNTER — Other Ambulatory Visit: Payer: Medicaid Other

## 2014-01-25 ENCOUNTER — Telehealth: Payer: Self-pay | Admitting: Oncology

## 2014-01-25 ENCOUNTER — Encounter: Payer: Self-pay | Admitting: *Deleted

## 2014-01-25 ENCOUNTER — Encounter: Payer: Self-pay | Admitting: Oncology

## 2014-01-25 ENCOUNTER — Ambulatory Visit: Payer: Medicaid Other

## 2014-01-25 VITALS — BP 131/85 | HR 88 | Temp 98.9°F | Resp 18 | Ht 60.0 in | Wt 166.6 lb

## 2014-01-25 DIAGNOSIS — Z23 Encounter for immunization: Secondary | ICD-10-CM

## 2014-01-25 DIAGNOSIS — C541 Malignant neoplasm of endometrium: Secondary | ICD-10-CM

## 2014-01-25 LAB — URINE CULTURE: Colony Count: 80000

## 2014-01-25 MED ORDER — INFLUENZA VAC SPLIT QUAD 0.5 ML IM SUSY
0.5000 mL | PREFILLED_SYRINGE | Freq: Once | INTRAMUSCULAR | Status: AC
  Administered 2014-01-25: 0.5 mL via INTRAMUSCULAR
  Filled 2014-01-25: qty 0.5

## 2014-01-25 NOTE — Progress Notes (Signed)
Patient will bring bank statement to Levada Dy or Ailene Ravel for [employer]. She does not work, she gets disability.

## 2014-01-25 NOTE — Progress Notes (Signed)
[name]  Clinical Social Mcdaniel met with pt after her medonc appointment to further assess needs and review previous concerns. Pt shared she continues to emotionally struggle with her current situation.  She reports she has received counseling in the past "some where off [employer]", but cannot remember the name of the location or counselor she saw. CSW reviewed support services and counseling interns as an additional resource. Pt was provided with a counseling brochure as well.   Pt reports she receives SSI disability and has issues with food currently. She receives food stamps, but feels too tired to fix food. [name] lives with her currently and helps some, but is 59yo. Pt very concerned about transportation, but CSW has educated pt several times on options and has arranged [name] as well. Pt requesting to be placed in a rehab so she can get meals fixed and get to her appointments. CSW educated pt on SNF placement and that she must have a skilled need like PT, wound care or other in order to qualify. Pt declined PT eval and services while in the hospital. Pt would also have to agree to stay for 30 days and sign her check to the SNF as they would be covering her room and board. Pt declined this option and would not meet criteria currently. CSW also discussed PCS through medicaid for ADL help, but CSW not sure pt qualifies for this currently. Pt is still able to walk in her home and do her ADLs. There are not many options for food preparation, which appears to be her biggest concern. Pt is not a senior, nor homebound so most likely would not qualify for Meals on Wheels at this time. She does have her friend Ronalee Belts with her today and he reports to be able to assist with transportation, but she may need ride on Tuesday.  Pt very appreciative, but appeared very overwhelmed with many aspects of her life. CSW helped get pt to financial counselor for grant fund assistance.   CSW to follow up  early next week on transportation status with [name] and other concerns.    Clinical Social Mcdaniel interventions:  Supportive listening Resource education Pt advocacy [name], [name]  [name] Phone: [phone] Fax: [phone]

## 2014-01-25 NOTE — Progress Notes (Signed)
OFFICE PROGRESS NOTE   01/25/2014   Physicians: Nancy Marus, Gery Pray, J.Jonnalagadda (psych), Dr Mirna Mires [phone] pain management)  INTERVAL HISTORY:  Patient is seen, brought to office by friend Ronalee Belts, in continuing attention to pelvic recurrence of endometrial cancer. Treatment has been delayed because of noncompliance and multiple other issues since the recurrence was first identified on CT 11-22-13. Most recently she was hospitalized from 10-11 thru 01-15-14 for acute diverticulitis. She has had simulation for radiation, which is to begin 01-30-14. She missed chemotherapy education class earlier this week, but did meet with RN educator prior to visit today.  EMR notes concern for drug seeking behaviour. She was seen by psychiatry during hospitalization.  Patient has multiple complaints, concerns and requests today; [place] social worker has talked with her after MD now. She complains of fatigue x several years, worse since hospitalization such that she is unable to prepare her own food. She is anxious about possible chemotherapy and about chemo nausea. She complains of intermittent gallbladder pain x years, no mid abdominal pain now but some low pelvic discomfort. She reports using hydrocodone at hs only. She was seen in ED on 01-24-14, felt to have UTI but has not filled antibiotic prescription yet. She denies fever or SOB. She is walking room to room at home, still in bed significant amount daily.   She does not have PAC. She agreed to flu vaccine today.  ONCOLOGIC HISTORY   Endometrial ca   07/21/2011 Initial Diagnosis Endometrial ca   11/23/2011 Surgery Robotic hysterectomy bilateral salpingo-oophorectomy.  Oncologic HPI: Patient presented to Dr [name] in April 2013 with >1 year vaginal bleeding, endometrial biopsy with grade 1 endometrial carcinoma. Patient was reluctant to proceed with care, however did have robotic assisted hysterectomy BSO 10-2011, with pathology demonstrating  superficially invasive endometroid carcinoma arising in endometrial polyp, FIGO 2, involving inner 1/2 of myometrium, negative ALI or cervical stromal involvement, ovaries and tubes negative. She did not keep follow up appointments with gyn oncology. She presented to ED 11-22-13 with concerns for recurrence, FTKA follow up with Dr Lurline Idol and CT. She was seen by Dr [name] on 12-12-13 requesting pain medication, complaining of continuous urinary leakage, some vaginal bleeding and pain involving back and right hip. Exam then had 5-6 cm mass at vaginal cuff, biopsy [id number] had invasive adenocarcinoma consistent with recurrent endometroid carcinoma. She had CT AP 12-12-13, this compared with 09-2012 showed new lobulated soft tissue mass involving vaginal cuff and posterior wall of the urinary bladder, with areas of internal necrosis, measuring 3.7 x 6.9 cm; there was also new pelvic adenopathy at iliac bifurcations bilaterally, but no obvious disease outside of pelvis, including nothing involving bowels, and no hydronephrosis. Patient subsequently cancelled new patient appointments with medical oncology and radiation oncology, despite several conversations with our offices, including [place] Education officer, museum.  Patient was admitted thru ED on 01-06-14 with acute diverticulitis. CT AP on admission had new focal acute diverticulosis involving mid sigmoid colon; on the prior CT there was no mention of tumor in that area; the pelvic mass was measured 7.8 x 4.0 cm.   Review of systems as above, also: No fever. Bowels are moving. Not SOB in WC on RA. No vomiting. LE swelling improved. No bleeding. Remainder of 10 point Review of Systems negative.  Objective:  Vital signs in last 24 hours:  BP 131/85  Pulse 88  Temp(Src) 98.9 F (37.2 C) (Oral)  Resp 18  Ht 5' (1.524 m)  Wt 166 lb 9.6 oz (  75.569 kg)  BMI 32.54 kg/m2  Weight today confirmed, tho note weight in EMR 01-23-14 recorded as 179  Alert, oriented,  talkative. In [facility] for all of visit.Looks more comfortable than in hospital. No alopecia  HEENT:PERRL, sclerae not icteric. Oral mucosa moist without lesions, posterior pharynx clear.  Neck supple. No JVD.  Lymphatics:no cervical,suraclavicular, axillary or inguinal adenopathy Resp: clear to auscultation bilaterally and normal percussion bilaterally Cardio: regular rate and rhythm. No gallop. GI: soft, nontender, not distended, no mass or organomegaly. Normally active bowel sounds. Surgical incision not remarkable. Musculoskeletal/ Extremities: without pitting edema, cords, tenderness Neuro: no peripheral neuropathy. Otherwise nonfocal Skin without rash, ecchymosis, petechiae Breasts: without dominant mass, skin or nipple findings. Axillae benign. Portacath-without erythema or tenderness  Lab Results:  Results for orders placed during the hospital encounter of 01/23/14  URINALYSIS, ROUTINE W REFLEX MICROSCOPIC      Result Value Ref Range   Color, Urine YELLOW  YELLOW   APPearance CLOUDY (*) CLEAR   Specific Gravity, Urine 1.011  1.005 - 1.030   pH 5.0  5.0 - 8.0   Glucose, UA NEGATIVE  NEGATIVE mg/dL   Hgb urine dipstick MODERATE (*) NEGATIVE   Bilirubin Urine NEGATIVE  NEGATIVE   Ketones, ur NEGATIVE  NEGATIVE mg/dL   Protein, ur NEGATIVE  NEGATIVE mg/dL   Urobilinogen, UA 0.2  0.0 - 1.0 mg/dL   Nitrite NEGATIVE  NEGATIVE   Leukocytes, UA MODERATE (*) NEGATIVE  CBC WITH DIFFERENTIAL      Result Value Ref Range   WBC 11.6 (*) 4.0 - 10.5 K/uL   RBC 4.82  3.87 - 5.11 MIL/uL   Hemoglobin 14.5  12.0 - 15.0 g/dL   HCT 41.6  36.0 - 46.0 %   MCV 86.3  78.0 - 100.0 fL   MCH 30.1  26.0 - 34.0 pg   MCHC 34.9  30.0 - 36.0 g/dL   RDW 12.8  11.5 - 15.5 %   Platelets 338  150 - 400 K/uL   Neutrophils Relative % 50  43 - 77 %   Neutro Abs 5.8  1.7 - 7.7 K/uL   Lymphocytes Relative 41  12 - 46 %   Lymphs Abs 4.8 (*) 0.7 - 4.0 K/uL   Monocytes Relative 6  3 - 12 %   Monocytes Absolute  0.6  0.1 - 1.0 K/uL   Eosinophils Relative 2  0 - 5 %   Eosinophils Absolute 0.3  0.0 - 0.7 K/uL   Basophils Relative 1  0 - 1 %   Basophils Absolute 0.1  0.0 - 0.1 K/uL  BASIC METABOLIC PANEL      Result Value Ref Range   Sodium 133 (*) 137 - 147 mEq/L   Potassium 4.4  3.7 - 5.3 mEq/L   Chloride 95 (*) 96 - 112 mEq/L   CO2 24  19 - 32 mEq/L   Glucose, Bld 95  70 - 99 mg/dL   BUN 11  6 - 23 mg/dL   Creatinine, Ser 0.74  0.50 - 1.10 mg/dL   Calcium 9.4  8.4 - 10.5 mg/dL   GFR calc non Af Amer >90  >90 mL/min   GFR calc Af Amer >90  >90 mL/min   Anion gap 14  5 - 15  URINE MICROSCOPIC-ADD ON      Result Value Ref Range   Squamous Epithelial / LPF RARE  RARE   WBC, UA 21-50  <3 WBC/hpf   RBC / HPF 0-2  <3  RBC/hpf   Bacteria, UA RARE  RARE     Studies/Results:  No results found. Last CT AP was 01-06-14.  Medications: I have reviewed the patient's current medications. She is given flu vaccine today  DISCUSSION: I have explained that combination of sensitizing chemotherapy with radiation can give improved response to radiation, however she feels that she will not be able to tolerate chemotherapy despite expressing that she wants "everything done". She prefers to begin radiation only, but will see me back after this starts to see if we can add sensitizing chemotherapy.  Assessment/Plan:  1.Recurrent endometrial adenocarcinoma with pelvic mass which appears to involve posterior bladder and iliac nodes bilaterally: in patient with history of superficially invasive FIGO 2 endometroid carcinoma treated with (delayed) hysterectomy BSO by Dr [name] [phone], then lost to follow up. Recommendation is for radiation with sensitizing CDDP, however patient prefers not to attempt chemotherapy at least as RT begins. I will see her back coordinating with RT on 02-08-14, and will add CDDP from there if possible. 2.acute sigmoid diverticulitis: treated in hospital 10-11 thru 01-15-14,  resolved. 3.chronic pain and/or drug seeking behavior. She has been followed by pain clinic; per Dr Elenora Gamma phone note 12-14-13 that clinic [phone]) will follow her if she gets substance abuse counseling. I have not given any pain prescriptions. Note she demanded IV narcotics in hospital, but ok since DC on oxycodone 4.major depressive disorder: psychiatry followed in hospital 5hypothyroidism on synthroid  6.obesity 7.suspected UTI at recent ER evaluation, culture pending. She plans to fill antibiotic prescription today. 8.social concerns: reported difficulty with transportation, lives with 29 yo mother. SW to assist as possible.    TIme spent 30 min including >50% counseling and coordination of care.   LIVESAY,LENNIS P, MD   01/25/2014, 1:28 PM

## 2014-01-25 NOTE — Telephone Encounter (Signed)
per pof to sch pt appt-gave pt copy of sch-sent Melissa email to override MD appt w/LL for 11/13-sch pt lab-pt aware of sch and will get updated copy when she comes on 01/31/15

## 2014-01-26 ENCOUNTER — Encounter: Payer: Self-pay | Admitting: Oncology

## 2014-01-28 ENCOUNTER — Other Ambulatory Visit: Payer: Medicaid Other

## 2014-01-28 ENCOUNTER — Encounter: Payer: Self-pay | Admitting: Oncology

## 2014-01-28 ENCOUNTER — Encounter: Payer: Self-pay | Admitting: *Deleted

## 2014-01-28 NOTE — Progress Notes (Signed)
Clinical Social Work contacted patient to discuss transportation options.  Ms. Woelfel's friend is transporting her to her appointments on 11/3 and 11/4.  CSW arranged [name] transportation for 11/5, 11/6, and 11/9.  CSW will meet with patient tomorrow after radiation oncology visit.  Polo Riley, MSW, LCSW, OSW-C Clinical Social Worker [facility] [phone]

## 2014-01-29 ENCOUNTER — Ambulatory Visit
Admission: RE | Admit: 2014-01-29 | Discharge: 2014-01-29 | Disposition: A | Discharge status: Home / Self Care | Payer: Medicaid Other | Source: Ambulatory Visit | Attending: Radiation Oncology | Admitting: Radiation Oncology

## 2014-01-29 ENCOUNTER — Encounter: Payer: Self-pay | Admitting: *Deleted

## 2014-01-29 DIAGNOSIS — C541 Malignant neoplasm of endometrium: Secondary | ICD-10-CM

## 2014-01-29 DIAGNOSIS — Z510 Encounter for antineoplastic radiation therapy: Secondary | ICD-10-CM | POA: Diagnosis not present

## 2014-01-29 NOTE — Progress Notes (Signed)
CSW met with patient in [facility] lobby. [name] stated she needed to provide proof of income to financial advocate.  CSW made copy of patient's checking statement and left documentation for [employer], financial advocate.  CSW provided patient with a 10-ride [name] voucher.  Polo Riley, MSW, LCSW, OSW-C Clinical Social Worker [facility] [phone]

## 2014-01-29 NOTE — Progress Notes (Signed)
  Radiation Oncology         [phone] ________________________________  Name: [name] MRN: [id number]  Date: 01/29/2014  DOB: 07-20-1955  Simulation Verification Note    ICD-9-CM ICD-10-CM   1. Endometrial ca 182.0 C54.1     Status: outpatient  NARRATIVE: The patient was brought to the treatment unit and placed in the planned treatment position. The clinical setup was verified. Then port films were obtained and uploaded to the radiation oncology medical record software.  The treatment beams were carefully compared against the planned radiation fields. The position location and shape of the radiation fields was reviewed. They targeted volume of tissue appears to be appropriately covered by the radiation beams. Organs at risk appear to be excluded as planned.  Based on my personal review, I approved the simulation verification. The patient's treatment will proceed as planned.  -----------------------------------  Blair Promise, PhD, MD

## 2014-01-30 ENCOUNTER — Inpatient Hospital Stay
Admission: RE | Admit: 2014-01-30 | Discharge: 2014-01-30 | Disposition: A | Discharge status: Home / Self Care | Payer: Self-pay | Source: Ambulatory Visit | Attending: Radiation Oncology | Admitting: Radiation Oncology

## 2014-01-30 ENCOUNTER — Encounter: Payer: Self-pay | Admitting: Radiation Oncology

## 2014-01-30 ENCOUNTER — Ambulatory Visit
Admission: RE | Admit: 2014-01-30 | Discharge: 2014-01-30 | Disposition: A | Discharge status: Home / Self Care | Payer: Medicaid Other | Source: Ambulatory Visit | Attending: Radiation Oncology | Admitting: Radiation Oncology

## 2014-01-30 VITALS — BP 120/77 | HR 88 | Temp 98.8°F | Ht <= 58 in | Wt 167.2 lb

## 2014-01-30 DIAGNOSIS — C541 Malignant neoplasm of endometrium: Secondary | ICD-10-CM

## 2014-01-30 DIAGNOSIS — Z510 Encounter for antineoplastic radiation therapy: Secondary | ICD-10-CM | POA: Diagnosis not present

## 2014-01-30 NOTE — Progress Notes (Signed)
  Radiation Oncology         [phone] ________________________________  Name: [name] MRN: [id number]  Date: 01/30/2014  DOB: Dec 17, 1955  Weekly Radiation Therapy Management  Diagnosis: recurrent endometrial cancer  Current Dose: 1.8 Gy     Planned Dose:  45 + Gy  Narrative . . . . . . . . The patient presents for routine under treatment assessment.                                   The patient requested today to be seen. She has questions about her radiation therapy. . I discussed external beam radiation therapy and then the possibility for a boost either external beam or brachytherapy. At this time the patient does not wish to proceed with radiosensitizing chemotherapy but will reconsider later during the course of her external beam treatments. The patient denies any vaginal bleeding. She does complain of urinary incontinence and will start her Macrobid for bladder infection.  She continues to have pain in the pelvis area.  I have informed the patient that I will refill her oxycodone while she is under treatment but she will have to obtain additional pain medication through the pain clinic when she completes her therapy.                                 Set-up films were reviewed.                                 The chart was checked. Physical Findings. . .  height is 4\' 10"  (1.473 m) and weight is 167 lb 3.2 oz (75.841 kg). Her oral temperature is 98.8 F (37.1 C). Her blood pressure is 120/77 and her pulse is 88. . Weight essentially stable.  No significant changes. Impression . . . . . . . The patient is tolerating radiation. Plan . . . . . . . . . . . . Continue treatment as planned.  ________________________________   Blair Promise, PhD, MD

## 2014-01-30 NOTE — Progress Notes (Signed)
[name] has completed 1 fraction to her pelvis.  She has questions for Dr. [name] regarding the staging of her cancer and brachytherapy treatments.  She also states that she only took 1 Macrobid tablet and is wondering if she should take the rest.  She said she was told she had a UTI in the hospital.  She denies any dysuria now but does have frequency and incontinence.  She was given the Radiation Therapy and You book to review with the diarrhea, fatigue and bladder changes tagged for her to read.

## 2014-01-31 ENCOUNTER — Ambulatory Visit
Admission: RE | Admit: 2014-01-31 | Discharge: 2014-01-31 | Disposition: A | Discharge status: Home / Self Care | Payer: Medicaid Other | Source: Ambulatory Visit | Attending: Radiation Oncology | Admitting: Radiation Oncology

## 2014-01-31 DIAGNOSIS — Z510 Encounter for antineoplastic radiation therapy: Secondary | ICD-10-CM | POA: Diagnosis not present

## 2014-02-01 ENCOUNTER — Ambulatory Visit
Admission: RE | Admit: 2014-02-01 | Discharge: 2014-02-01 | Disposition: A | Discharge status: Home / Self Care | Payer: Medicaid Other | Source: Ambulatory Visit | Attending: Radiation Oncology | Admitting: Radiation Oncology

## 2014-02-01 DIAGNOSIS — Z510 Encounter for antineoplastic radiation therapy: Secondary | ICD-10-CM | POA: Diagnosis not present

## 2014-02-01 NOTE — Progress Notes (Addendum)
Reviewed Post Sim Ed including fatigue, diarrhea and skin irritation with Caren Griffins.  She did not have any questions.  Advised her to call with any concerns.

## 2014-02-03 ENCOUNTER — Other Ambulatory Visit: Payer: Self-pay | Admitting: Oncology

## 2014-02-04 ENCOUNTER — Ambulatory Visit
Admission: RE | Admit: 2014-02-04 | Discharge: 2014-02-04 | Disposition: A | Discharge status: Home / Self Care | Payer: Medicaid Other | Source: Ambulatory Visit | Attending: Radiation Oncology | Admitting: Radiation Oncology

## 2014-02-04 DIAGNOSIS — Z510 Encounter for antineoplastic radiation therapy: Secondary | ICD-10-CM | POA: Diagnosis not present

## 2014-02-05 ENCOUNTER — Ambulatory Visit
Admission: RE | Admit: 2014-02-05 | Discharge: 2014-02-05 | Disposition: A | Discharge status: Home / Self Care | Payer: Medicaid Other | Source: Ambulatory Visit | Attending: Radiation Oncology | Admitting: Radiation Oncology

## 2014-02-05 DIAGNOSIS — Z510 Encounter for antineoplastic radiation therapy: Secondary | ICD-10-CM | POA: Diagnosis not present

## 2014-02-06 ENCOUNTER — Telehealth: Payer: Self-pay | Admitting: Oncology

## 2014-02-06 ENCOUNTER — Ambulatory Visit
Admission: RE | Admit: 2014-02-06 | Discharge: 2014-02-06 | Disposition: A | Discharge status: Home / Self Care | Payer: Medicaid Other | Source: Ambulatory Visit | Attending: Radiation Oncology | Admitting: Radiation Oncology

## 2014-02-06 ENCOUNTER — Encounter: Payer: Self-pay | Admitting: Radiation Oncology

## 2014-02-06 VITALS — BP 135/76 | HR 93 | Temp 98.7°F | Resp 20 | Ht <= 58 in | Wt 168.8 lb

## 2014-02-06 DIAGNOSIS — C541 Malignant neoplasm of endometrium: Secondary | ICD-10-CM

## 2014-02-06 DIAGNOSIS — Z510 Encounter for antineoplastic radiation therapy: Secondary | ICD-10-CM | POA: Diagnosis not present

## 2014-02-06 MED ORDER — ONDANSETRON HCL 4 MG PO TABS: 4.0000 mg | ORAL_TABLET | Freq: Four times a day (QID) | ORAL | Status: DC | PRN

## 2014-02-06 MED ORDER — OXYCODONE HCL 10 MG PO TABS: 10.0000 mg | ORAL_TABLET | Freq: Four times a day (QID) | ORAL | Status: DC | PRN

## 2014-02-06 NOTE — Telephone Encounter (Signed)
Talked to [name] to see if [name] is coming for treatment today.  [name] said she has been waiting for a ride for 2 hours and the [name] bus just picked her up.  [name] that we will be waiting for her.

## 2014-02-06 NOTE — Progress Notes (Signed)
  Radiation Oncology         [phone] ________________________________  Name: [name] MRN: [id number]  Date: 02/06/2014  DOB: Aug 06, 1955  Weekly Radiation Therapy Management  Diagnosis: recurrent endometrial cancer  Current Dose: 10.8 Gy     Planned Dose:  45 + Gy  Narrative . . . . . . . . The patient presents for routine under treatment assessment.                                   The patient noticed some improvement in her pain. She is taking one oxycodone usually at night so that she can sleep. This is also noticed less vaginal discharge. She denies any vaginal bleeding. She denies any significant problems with nausea.  She is taking her Zofran as recommended                                 Set-up films were reviewed.                                 The chart was checked. Physical Findings. . .  height is 4\' 10"  (1.473 m) and weight is 168 lb 12.8 oz (76.567 kg). Her oral temperature is 98.7 F (37.1 C). Her blood pressure is 135/76 and her pulse is 93. Her respiration is 20. . The lungs are clear. The heart has a regular rhythm and rate. The abdomen is soft and nontender with normal bowel sounds. Impression . . . . . . . The patient is tolerating radiation. Plan . . . . . . . . . . . . Continue treatment as planned. Refilled Zofran and oxycodone today  ________________________________   Blair Promise, PhD, MD

## 2014-02-06 NOTE — Progress Notes (Addendum)
[name] has completed 6 fractions to her pelvis.  She reports having sharp pains in her pelvic area today that she is rating at a 6/10.  She is taking 1 oxycodone tablet at bedtime.  She reports having 6 left and is requesting a refill.  She reports feeling weak and faint.  She denies nausea and is taking zofran and is needing a refill.  She reports having a little bit of burning and itching with urination this morning.  She did not take the macrobid because she was scared it would make her tired.  She denies having diarrhea but is having more bowel movements.  She denies any vaginal/rectal bleeding.  She does report less vaginal discharge.

## 2014-02-07 ENCOUNTER — Encounter: Payer: Self-pay | Admitting: Radiation Oncology

## 2014-02-07 ENCOUNTER — Inpatient Hospital Stay
Admission: RE | Admit: 2014-02-07 | Discharge: 2014-02-07 | Disposition: A | Discharge status: Home / Self Care | Payer: Medicaid Other | Source: Ambulatory Visit | Attending: Radiation Oncology | Admitting: Radiation Oncology

## 2014-02-07 ENCOUNTER — Ambulatory Visit
Admission: RE | Admit: 2014-02-07 | Discharge: 2014-02-07 | Disposition: A | Discharge status: Home / Self Care | Payer: Medicaid Other | Source: Ambulatory Visit | Attending: Radiation Oncology | Admitting: Radiation Oncology

## 2014-02-07 VITALS — BP 123/79 | HR 101

## 2014-02-07 DIAGNOSIS — C541 Malignant neoplasm of endometrium: Secondary | ICD-10-CM

## 2014-02-07 NOTE — Progress Notes (Signed)
Patient states she is feeling weak and faint again today.  She said that she stumbled, fell and hit her right hip.  No bruising noted on her right hip and she is not having trouble walking.

## 2014-02-07 NOTE — Progress Notes (Signed)
Department of Radiation Oncology  Phone:  [phone] Fax:        [phone]  Weekly Treatment Note    Name: [name] Date: 02/07/2014 MRN: [id number] DOB: Jun 12, 1955   Current dose: 10.8 Gy  Current fraction:6   MEDICATIONS: Current Outpatient Prescriptions  Medication Sig Dispense Refill  . albuterol (VENTOLIN HFA) 108 (90 BASE) MCG/ACT inhaler Inhale 2 puffs into the lungs every 4 (four) hours as needed for wheezing or shortness of breath. 1 Inhaler 0  . b complex vitamins capsule Take 1 capsule by mouth daily.    Marland Kitchen ibuprofen (ADVIL,MOTRIN) 200 MG tablet Take 400 mg by mouth every 6 (six) hours as needed for moderate pain.    Marland Kitchen levothyroxine (SYNTHROID, LEVOTHROID) 125 MCG tablet Take 1 tablet (125 mcg total) by mouth daily before breakfast. 30 tablet 0  . LORazepam (ATIVAN) 1 MG tablet Take 1 tablet (1 mg total) by mouth 2 (two) times daily. 30 tablet 0  . Methylsulfonylmethane (MSM) 1000 MG TABS Take 1 tablet by mouth daily.    . Multiple Vitamins-Minerals (MULTIVITAMIN) tablet Take 1 tablet by mouth daily. 30 tablet 0  . nitrofurantoin, macrocrystal-monohydrate, (MACROBID) 100 MG capsule Take 1 capsule (100 mg total) by mouth 2 (two) times daily. 10 capsule 0  . ondansetron (ZOFRAN) 4 MG tablet Take 1 tablet (4 mg total) by mouth every 6 (six) hours as needed for nausea. 20 tablet 2  . Oxycodone HCl 10 MG TABS Take 1 tablet (10 mg total) by mouth every 6 (six) hours as needed. 30 tablet 0  . sertraline (ZOLOFT) 100 MG tablet Take 200 mg by mouth every evening.     . thiothixene (NAVANE) 2 MG capsule Take 1 capsule (2 mg total) by mouth at bedtime. 10 capsule 0  . Vitamin D, Ergocalciferol, (DRISDOL) 50000 UNITS CAPS capsule Take 50,000 Units by mouth every 7 (seven) days. No specific days     No current facility-administered medications for this encounter.     ALLERGIES: Sulfa antibiotics and Sulfamethoxazole-trimethoprim   LABORATORY DATA:  Lab  Results  Component Value Date   WBC 11.6* 01/24/2014   HGB 14.5 01/24/2014   HCT 41.6 01/24/2014   MCV 86.3 01/24/2014   PLT 338 01/24/2014   Lab Results  Component Value Date   NA 133* 01/24/2014   K 4.4 01/24/2014   CL 95* 01/24/2014   CO2 24 01/24/2014   Lab Results  Component Value Date   ALT 14 01/06/2014   AST 20 01/06/2014   ALKPHOS 58 01/06/2014   BILITOT 0.5 01/06/2014     NARRATIVE: [name] was seen today for weekly treatment management. The chart was checked and the patient's films were reviewed.  The patient has not been treated yet today.Patient states she is feeling weak and faint again today.  She said that she stumbled, fell and hit her right hip.  No bruising noted on her right hip and she is not having trouble walking. The patient denies ongoing significant pain in the right hip currently. She was able to walk well after falling. She does feel significant fatigue however and we discussed this can be a combination of things which can worsen during treatment.  PHYSICAL EXAMINATION: blood pressure is 123/79 and her pulse is 101.      Alert, in no acute distress. Sitting in a wheelchair.  ASSESSMENT: The patient is doing satisfactorily with treatment. She fell today on the bus. Orthostatic measurements looked fine.  PLAN: We  will continue with the patient's radiation treatment as planned.

## 2014-02-07 NOTE — Progress Notes (Signed)
  CARE MANAGEMENT ED NOTE 02/07/2014  Patient:  [name],[name]   Account Number:  [id number]  Date Initiated:  02/07/2014  Documentation initiated by:  Jackelyn Poling  Subjective/Objective Assessment:   58 yr old [place] oncology pt found by 2nfd floor administration staff wandering around [place] [facility] ED waiting area by [facility] security to get assist with transportation home     Subjective/Objective Assessment Detail:   Pt reports coming to cancer center for oncology treatment 02/07/14 via [name] was scheduled to return home via [name] but had medical concerns that oncology staff wanted to monitor her for Pt Reports a "fall" but found to be ok fo rd/c by oncology Pt reports calling [name] who was unable to provide her with a time for transport Pt reports wandering around until found by security Pt inquired if security staff would stay with her in lobby and be here tonight She was informed the security staff would be securing the building throughout the night but the staff member who found her shift'Mcdaniel would end soon  Pt denied need to be seen by ED staff in [place] for her "weakness" stating she just wanted to go home     Action/Plan:   ED CM contacted by administration staff to assist pt with transportation.  CM spoke with ED SW; spoke with pt to assess her condition offered triage services when pt reported being weak.  Assess her home address   Action/Plan Detail:   Provided SW with pt address to receive a voucher prn   Anticipated DC Date:  02/07/2014     Status Recommendation to Physician:   Result of Recommendation:    Other ED Services  Consult Working Plan   In-house referral  Clinical Social Worker   DC Forensic scientist  Other  Outpatient Services - Pt will follow up    Choice offered to / List presented to:            Status of service:  Completed, signed off  ED Comments:   ED Comments Detail:

## 2014-02-08 ENCOUNTER — Other Ambulatory Visit: Payer: Self-pay

## 2014-02-08 ENCOUNTER — Ambulatory Visit: Payer: Self-pay | Admitting: Oncology

## 2014-02-08 ENCOUNTER — Ambulatory Visit: Payer: Medicaid Other

## 2014-02-08 ENCOUNTER — Telehealth: Payer: Self-pay | Admitting: Oncology

## 2014-02-08 NOTE — Telephone Encounter (Signed)
[name] called and said that [name] did not pick her up yesterday afternoon.  She ended up going to the ER to get a taxi voucher but was able to have her neighbor take her home.  Transferred her to [name], CSW to see if the problems with [name] can be resolved.

## 2014-02-11 ENCOUNTER — Ambulatory Visit: Payer: Medicaid Other

## 2014-02-11 ENCOUNTER — Telehealth: Payer: Self-pay | Admitting: Oncology

## 2014-02-11 ENCOUNTER — Telehealth: Payer: Self-pay

## 2014-02-11 NOTE — Telephone Encounter (Signed)
Notified [name] that the machine is down so her treatment is canceled today.  [name] verbalized agreement and said that she is feeling tired today.  She said that this would give her a chance to rest.

## 2014-02-11 NOTE — Telephone Encounter (Signed)
Told [name]'s that Dr. Marko Plume said that she does not need to reschedule a return appointment with her at this time.   She just needs to finish the radiation and follow up with Dr. [name] ~a month after the completion of radiation and then her follow up appointments will be determined and with which physicians. [name]'s verbalized understanding.

## 2014-02-11 NOTE — Telephone Encounter (Signed)
Called [name] and rescheduled Cleopha's pick up time to 11:00-11:30 to get here for her 12:15 treatment.  Called Kenidy and notified her of the pick up time.  Rayetta said she was not sure if she would be able to make it tomorrow due to feeling tired.  Advised her to go to bed early tonight and to be sure the come in tomorrow to get her treatment.  Shikara verbalized agreement.

## 2014-02-12 ENCOUNTER — Ambulatory Visit: Payer: Medicaid Other

## 2014-02-12 ENCOUNTER — Ambulatory Visit: Payer: Medicaid Other | Admitting: Radiation Oncology

## 2014-02-13 ENCOUNTER — Ambulatory Visit: Payer: Medicaid Other

## 2014-02-13 ENCOUNTER — Telehealth: Payer: Self-pay | Admitting: Oncology

## 2014-02-13 NOTE — Telephone Encounter (Signed)
Zakyra called, tearful, and said "what am I going to do?  I am so weak."  She said that she has been told that she might be dehydrated and that people can die from being dehydrated.  Asked her if she had been drinking fluids and she said a little.  She asked if I could call 911 for her.  Advised her that it would be better if she called 911.  She then put her mother on the phone.  Asked her mother if Camron had been vomiting or having diarrhea.  She said that she was not vomiting but did have 4 soft bowel movements yesterday, none today.  Advised her to have Kristyn push clear fluids tonight and to come in to see the doctor tomorrow or she could go the ER tonight.  Jeannette said she was going to call 911.  Advised her mother to call back if she needs anything.  She verbalized agreement.

## 2014-02-14 ENCOUNTER — Other Ambulatory Visit: Payer: Self-pay | Admitting: Oncology

## 2014-02-14 ENCOUNTER — Other Ambulatory Visit: Payer: Self-pay | Admitting: Internal Medicine

## 2014-02-14 ENCOUNTER — Ambulatory Visit: Payer: Medicaid Other

## 2014-02-14 ENCOUNTER — Telehealth: Payer: Self-pay | Admitting: Oncology

## 2014-02-14 DIAGNOSIS — C541 Malignant neoplasm of endometrium: Secondary | ICD-10-CM

## 2014-02-14 NOTE — Telephone Encounter (Signed)
Called [name] and made an appointment for Elainah to get to the cancer center by 11:00.  They said they would pick her up at 10:15-10:46.  Called Vinia and advised her of the pick up time and explained that she will need to come in at 11:00 to get labs and then get her radiation treatment.  Mirabelle was concerned about being able to wake up early enough because she has sleep apnea.  Advised her that it is really important to come in for labs and to try to come in tomorrow.  Jolina said she would try.

## 2014-02-15 ENCOUNTER — Telehealth: Payer: Self-pay | Admitting: Oncology

## 2014-02-15 ENCOUNTER — Encounter: Payer: Self-pay | Admitting: *Deleted

## 2014-02-15 ENCOUNTER — Ambulatory Visit: Payer: Medicaid Other

## 2014-02-15 ENCOUNTER — Ambulatory Visit: Payer: Self-pay

## 2014-02-15 NOTE — Telephone Encounter (Signed)
Called Quintana due to her canceling her appointment today.  She said she is feeling to weak to come in.  She said she felt faint briefly yesterday. She said she was up 2-3 times last night to urinate which is normal for her.  She said she has itching/irritation inside her vaginal area. She is wondering if she had a bladder infection.  She took her temperature and it is 99.4 degrees.  She did not take her antibiotic for the last UTI she had.  She is also having 3-4 loose, small bowel movements per day.  She does not want to take Imodium yet.  She also said the pain in her pelvic area had gone away with radiation but is now back.  Advised her to push fluids and to go to the ER if she has a temperature of 101 or greater during the weekend.  Romy verbalized agreement and said she would try to come for her treatment on Monday.

## 2014-02-15 NOTE — Progress Notes (Signed)
CSW arranged patient's [name] transportation for 11/23, 11/24, and 11/25.  Polo Riley, MSW, LCSW, OSW-C Clinical Social Worker [facility] [place] [phone]

## 2014-02-18 ENCOUNTER — Ambulatory Visit
Admission: RE | Admit: 2014-02-18 | Discharge: 2014-02-18 | Disposition: A | Discharge status: Home / Self Care | Payer: Medicaid Other | Source: Ambulatory Visit | Attending: Radiation Oncology | Admitting: Radiation Oncology

## 2014-02-18 ENCOUNTER — Other Ambulatory Visit: Payer: Self-pay | Admitting: Internal Medicine

## 2014-02-18 ENCOUNTER — Telehealth: Payer: Self-pay | Admitting: Oncology

## 2014-02-18 ENCOUNTER — Ambulatory Visit: Payer: Medicaid Other

## 2014-02-18 DIAGNOSIS — Z510 Encounter for antineoplastic radiation therapy: Secondary | ICD-10-CM | POA: Diagnosis not present

## 2014-02-18 DIAGNOSIS — C541 Malignant neoplasm of endometrium: Secondary | ICD-10-CM

## 2014-02-18 LAB — CBC WITH DIFFERENTIAL/PLATELET
BASO%: 0.4 % (ref 0.0–2.0)
BASOS ABS: 0 10*3/uL (ref 0.0–0.1)
EOS ABS: 0.1 10*3/uL (ref 0.0–0.5)
EOS%: 2.9 % (ref 0.0–7.0)
HCT: 38.8 % (ref 34.8–46.6)
HEMOGLOBIN: 12.9 g/dL (ref 11.6–15.9)
LYMPH%: 29.7 % (ref 14.0–49.7)
MCH: 28.9 pg (ref 25.1–34.0)
MCHC: 33.2 g/dL (ref 31.5–36.0)
MCV: 87.3 fL (ref 79.5–101.0)
MONO#: 0.5 10*3/uL (ref 0.1–0.9)
MONO%: 10.7 % (ref 0.0–14.0)
NEUT%: 56.3 % (ref 38.4–76.8)
NEUTROS ABS: 2.8 10*3/uL (ref 1.5–6.5)
Platelets: 163 10*3/uL (ref 145–400)
RBC: 4.45 10*6/uL (ref 3.70–5.45)
RDW: 14.3 % (ref 11.2–14.5)
WBC: 5 10*3/uL (ref 3.9–10.3)
lymph#: 1.5 10*3/uL (ref 0.9–3.3)

## 2014-02-18 LAB — BASIC METABOLIC PANEL (CC13)
Anion Gap: 11 mEq/L (ref 3–11)
BUN: 7.8 mg/dL (ref 7.0–26.0)
CO2: 27 mEq/L (ref 22–29)
Calcium: 9.9 mg/dL (ref 8.4–10.4)
Chloride: 101 mEq/L (ref 98–109)
Creatinine: 0.7 mg/dL (ref 0.6–1.1)
Glucose: 93 mg/dl (ref 70–140)
POTASSIUM: 4.2 meq/L (ref 3.5–5.1)
Sodium: 139 mEq/L (ref 136–145)

## 2014-02-18 NOTE — Telephone Encounter (Signed)
[name] called and said her fatigue/weakness is better.  She reports she is having increased pain "where her cancer is."  She will be coming in today for radiation and her friend will be bringing her.  Advised her to come in at 3:15 for labs.  [name] verbalized agreement.

## 2014-02-19 ENCOUNTER — Telehealth: Payer: Self-pay | Admitting: Oncology

## 2014-02-19 ENCOUNTER — Ambulatory Visit: Payer: Medicaid Other

## 2014-02-19 ENCOUNTER — Ambulatory Visit: Payer: Medicaid Other | Admitting: Radiation Oncology

## 2014-02-19 NOTE — Telephone Encounter (Signed)
[name] called, tearful, saying that she is canceling her treatment today.  She is angry that she has been switched to Linac 1 from Linac 3.  She said she was comfortable on Linac 3 and liked the therapists.  She also said she was not told about the machine switch.  She said she was arguing with Joellen Jersey, RT who said she was notified.  Joanne also said she was feeling tired again and will not be able to come in today.  She asked about her lab results from yesterday.  Advised her that a doctor would need to give her the results.  Advised her that I would talk to the therapists to see if anything can be done and that I would call her back.  Also called Lauren, CSW and left her a message for assistance.

## 2014-02-19 NOTE — Telephone Encounter (Signed)
Called [name] to advise her that we are still working on her issue with switching machines.

## 2014-02-20 ENCOUNTER — Telehealth: Payer: Self-pay | Admitting: Oncology

## 2014-02-20 ENCOUNTER — Ambulatory Visit: Payer: Medicaid Other

## 2014-02-20 NOTE — Telephone Encounter (Signed)
Called [name] to see if she would be willing to have treatment on Linac 1 if one of the therapists that she knows on Linac 3 would be with her.  [name] said she would not be coming in today because she has a headache and because she had 4 loose bowel movements yesterday.  She said she would be willing to try Linac 1 again on Monday.  She also said that she thinks we put new people on Linac 3 to impress them and then move them after a week to another machine once they are used to radiation.  Advised her that this is not the case and that she was moved due to linac 3 being down and not wanting her treatment to be delayed.  [name] insisted 2 more times that she was moved because she was not a new patient anymore.  Reinforced that she was moved due to the machine being down.  Advised her that we will see her on Monday and that I would call to cancel her [name] ride.  [name] verbalized agreement.  Called [name] and talked to [place] who canceled [name]'s appointment for today.  Notified Linac 1 of patient's cancellation.

## 2014-02-25 ENCOUNTER — Telehealth: Payer: Self-pay | Admitting: Oncology

## 2014-02-25 ENCOUNTER — Ambulatory Visit: Payer: Medicaid Other

## 2014-02-25 NOTE — Telephone Encounter (Signed)
Called [name] to make sure patient had an reservation for transportation today.  Per [name], she does not.  Called to see if [name] has a ride for Tracy Radiation treatment today.  Per Tracy mother, [name] could not come to the phone because she was getting Tracy breakfast ready.  [name] told Tracy mother in the background that she is not coming to day because she is too weak.  She said she needs "something to build Tracy strength up."  [name] that [name] needs to come in to see the doctor.  [name] could be heard in the background saying "I will have to call Tracy back."  [name] that I would let the machine know that she is canceling today.

## 2014-02-25 NOTE — Telephone Encounter (Signed)
[name] called back and said she is too weak to come to Radiation today and doesn't want to get out of bed.  She said her cancer pain is back and she is also having increased vaginal discharge with some bleeding.  She reports she is also having burning with urination.  She said she needs something done to make her feel better.  Advised her that she will need to come in to see the doctor or call an ambulance to go to the ER.   She said she does not want to go to the ER because she does not have a ride home.  She said she will try to come in tomorrow.  Called [name] and made an appointment with Baker Janus and a pick up time was arranged for 2:03-2:33.  Called [name] back to tell her the time.  She then said she wanted to know what will be done for her before she comes tomorrow.  She is frustrated that nothing has been done about her fatigue and weakness.  Advised her that she will need to see the doctor before anything can be ordered.  She stated that she would like to request a scan to see if her cancer is growing.  Advised her that Dr. [name] would be notified of her request.

## 2014-02-26 ENCOUNTER — Ambulatory Visit: Payer: Medicaid Other

## 2014-02-26 ENCOUNTER — Telehealth: Payer: Self-pay | Admitting: Oncology

## 2014-02-26 ENCOUNTER — Ambulatory Visit: Payer: Medicaid Other | Admitting: Radiation Oncology

## 2014-02-26 NOTE — Telephone Encounter (Signed)
[name] called and said she did not sleep well due to increased pain last night.  She canceled treatment today and said she will most likely not being coming tomorrow.  She is upset that nothing is being done for her fatigue.  She is insisting that something be done for her weakness like a B12 shot or fluids.  Informed her that she needs to come in to see Dr. [name] or to go to the ER.  Advised her that we need to take her vitals to see how she if she is dehydrated.  She insisted that she is dehydrated and put her mother on the phone.  Advised her mother that she needs to come in to see the doctor or go to the ER.

## 2014-02-27 ENCOUNTER — Telehealth: Payer: Self-pay | Admitting: Oncology

## 2014-02-27 ENCOUNTER — Ambulatory Visit: Payer: Medicaid Other

## 2014-02-27 NOTE — Telephone Encounter (Signed)
Called [name] to see if she is coming to radiation today.  There was no answer.

## 2014-02-27 NOTE — Telephone Encounter (Signed)
Called [name] and she said she is not coming for treatment the rest of this week.  She is still feeling the same: weak and not sleeping well.  She said she is going to come on Monday, 12/7.  Advised her that Sharilyn Sites will be called to set up her rides for next week.  Sharilyn Sites has been called and rides have been set up for December 7,8,9.  Tried to call [name] back with the times with no answer.

## 2014-02-28 ENCOUNTER — Ambulatory Visit: Payer: Medicaid Other

## 2014-03-01 ENCOUNTER — Ambulatory Visit: Payer: Medicaid Other

## 2014-03-01 ENCOUNTER — Telehealth: Payer: Self-pay | Admitting: Oncology

## 2014-03-01 NOTE — Telephone Encounter (Signed)
Called to give [name] scat time for next week.  There was no answer.

## 2014-03-04 ENCOUNTER — Ambulatory Visit
Admission: RE | Admit: 2014-03-04 | Discharge: 2014-03-04 | Disposition: A | Discharge status: Home / Self Care | Payer: Medicaid Other | Source: Ambulatory Visit | Attending: Radiation Oncology | Admitting: Radiation Oncology

## 2014-03-04 ENCOUNTER — Telehealth: Payer: Self-pay | Admitting: Oncology

## 2014-03-04 ENCOUNTER — Encounter: Payer: Self-pay | Admitting: Radiation Oncology

## 2014-03-04 DIAGNOSIS — Z510 Encounter for antineoplastic radiation therapy: Secondary | ICD-10-CM | POA: Diagnosis not present

## 2014-03-04 NOTE — Telephone Encounter (Signed)
[name] called and said she did not know if she is going to make it for treatment today.  She is having a lot of pain in her pelvic area with increased vaginal discharge/bleeding.  She said she had a temperature of 99.2 last night.  She reports she is very weak.  Again asked if she could go the ER.  [name] said she does not want to make her friend wait that long.  Her friend can take her to treatment today so she is going to try and rest and see if she feels better.  She would like to talk to [name] regarding the [name] bus.  She does not want to continue taking the bus and wants to see if she can use the volunteer service.  Advised her I would leave a message for [name] and will cancel her [name] ride for today.  Asked that she call to let us know if she is coming for treatment or not.  [name] verbalized agreement.

## 2014-03-04 NOTE — Progress Notes (Signed)
[name] has completed 8 fractions to her pelvis.  She reports pain in her pelvis at a 6/10 and says she has been having pain in the left side of her abdomen like when she had diverticulitis.  She reports feeling very weak.  Orthostatic vitals done bp sitting 113/80, hr 85, bp standing 113/78, hr 97.  She reports a good appetitie.  She reports having vaginal discharge/bleeding and is using 1 pad a day.  She reports burning with urination especially at night.

## 2014-03-05 ENCOUNTER — Telehealth: Payer: Self-pay | Admitting: Oncology

## 2014-03-05 ENCOUNTER — Ambulatory Visit: Payer: Medicaid Other | Admitting: Radiation Oncology

## 2014-03-05 ENCOUNTER — Ambulatory Visit: Payer: Medicaid Other

## 2014-03-05 ENCOUNTER — Other Ambulatory Visit: Payer: Self-pay | Admitting: Oncology

## 2014-03-05 DIAGNOSIS — C541 Malignant neoplasm of endometrium: Secondary | ICD-10-CM

## 2014-03-05 NOTE — Telephone Encounter (Signed)
[name] called, very upset with a raised voice and tearful.  She doesn't think she can continue with radiation unless something is done for her weakness and dehydration.  She said she has been looking on the Internet and has read that radiation causes dehydration due to "fluid losses in tissues."  She said that even though her vital signs do not show that she is dehydrated, she "knows her body and is dehydrated and needs fluids."  She feels like she is so weak and that "she is not going to make it."  She is also upset that we have not given her a B12 shot or fluids and has talked to her pharmacist friend about this who was "appalled at how we are treating her."  Advised her that she needs to come in to see Dr. [name] so that he can order what she needs.  [name] then stated that she has had sharp pains in her chest today and also has been having 2-4 loose bowel movements after eating with bad cramps in her lower abdomen.  Advised her to try the BRAT diet and to make sure she comes to see Dr. [name] tomorrow.  [name] verbalized agreement.

## 2014-03-05 NOTE — Telephone Encounter (Signed)
Called [name] to advise her that the [name] times could not be changed for today.  [name] said that she is not coming in today and to cancel the [name] ride.  Encouraged her to keep her appointment today but she said she is too weak for treatment and is worried that the pain she is having is diverticulitis again.  Advised her that labs are scheduled for tomorrow and that her [name] time has been changed so that she can see Dr. [name].  [name] verbalized agreement and said she would come in tomorrow.  Called [name] on Linac 1 to let her know that [name] is canceling her treatment today.  Also called [name] and canceled her appointment.

## 2014-03-06 ENCOUNTER — Ambulatory Visit: Payer: Medicaid Other | Admitting: Radiation Oncology

## 2014-03-06 ENCOUNTER — Ambulatory Visit: Payer: Medicaid Other

## 2014-03-06 ENCOUNTER — Emergency Department (HOSPITAL_COMMUNITY): Payer: Medicaid Other

## 2014-03-06 ENCOUNTER — Emergency Department (HOSPITAL_COMMUNITY)
Admission: EM | Admit: 2014-03-06 | Discharge: 2014-03-07 | Disposition: A | Discharge status: Home / Self Care | Payer: Medicaid Other | Attending: Emergency Medicine | Admitting: Emergency Medicine

## 2014-03-06 ENCOUNTER — Telehealth: Payer: Self-pay | Admitting: Oncology

## 2014-03-06 ENCOUNTER — Encounter (HOSPITAL_COMMUNITY): Payer: Self-pay | Admitting: *Deleted

## 2014-03-06 DIAGNOSIS — C541 Malignant neoplasm of endometrium: Secondary | ICD-10-CM | POA: Diagnosis not present

## 2014-03-06 DIAGNOSIS — F419 Anxiety disorder, unspecified: Secondary | ICD-10-CM | POA: Diagnosis not present

## 2014-03-06 DIAGNOSIS — E669 Obesity, unspecified: Secondary | ICD-10-CM | POA: Diagnosis not present

## 2014-03-06 DIAGNOSIS — Z79899 Other long term (current) drug therapy: Secondary | ICD-10-CM | POA: Diagnosis not present

## 2014-03-06 DIAGNOSIS — I10 Essential (primary) hypertension: Secondary | ICD-10-CM | POA: Diagnosis not present

## 2014-03-06 DIAGNOSIS — G8929 Other chronic pain: Secondary | ICD-10-CM | POA: Diagnosis not present

## 2014-03-06 DIAGNOSIS — F329 Major depressive disorder, single episode, unspecified: Secondary | ICD-10-CM | POA: Diagnosis not present

## 2014-03-06 DIAGNOSIS — M1990 Unspecified osteoarthritis, unspecified site: Secondary | ICD-10-CM | POA: Diagnosis not present

## 2014-03-06 DIAGNOSIS — R531 Weakness: Secondary | ICD-10-CM | POA: Diagnosis present

## 2014-03-06 DIAGNOSIS — N390 Urinary tract infection, site not specified: Secondary | ICD-10-CM | POA: Insufficient documentation

## 2014-03-06 DIAGNOSIS — G4730 Sleep apnea, unspecified: Secondary | ICD-10-CM | POA: Insufficient documentation

## 2014-03-06 DIAGNOSIS — J45909 Unspecified asthma, uncomplicated: Secondary | ICD-10-CM | POA: Diagnosis not present

## 2014-03-06 DIAGNOSIS — E039 Hypothyroidism, unspecified: Secondary | ICD-10-CM | POA: Insufficient documentation

## 2014-03-06 LAB — CBC WITH DIFFERENTIAL/PLATELET
BASOS ABS: 0 10*3/uL (ref 0.0–0.1)
Basophils Relative: 0 % (ref 0–1)
EOS PCT: 4 % (ref 0–5)
Eosinophils Absolute: 0.3 10*3/uL (ref 0.0–0.7)
HEMATOCRIT: 37.1 % (ref 36.0–46.0)
Hemoglobin: 12.5 g/dL (ref 12.0–15.0)
LYMPHS ABS: 1.5 10*3/uL (ref 0.7–4.0)
LYMPHS PCT: 24 % (ref 12–46)
MCH: 28.8 pg (ref 26.0–34.0)
MCHC: 33.7 g/dL (ref 30.0–36.0)
MCV: 85.5 fL (ref 78.0–100.0)
MONOS PCT: 8 % (ref 3–12)
Monocytes Absolute: 0.5 10*3/uL (ref 0.1–1.0)
Neutro Abs: 4 10*3/uL (ref 1.7–7.7)
Neutrophils Relative %: 64 % (ref 43–77)
Platelets: 152 10*3/uL (ref 150–400)
RBC: 4.34 MIL/uL (ref 3.87–5.11)
RDW: 14.5 % (ref 11.5–15.5)
WBC: 6.3 10*3/uL (ref 4.0–10.5)

## 2014-03-06 LAB — COMPREHENSIVE METABOLIC PANEL
ALT: 25 U/L (ref 0–35)
AST: 23 U/L (ref 0–37)
Albumin: 4.1 g/dL (ref 3.5–5.2)
Alkaline Phosphatase: 58 U/L (ref 39–117)
Anion gap: 15 (ref 5–15)
BUN: 7 mg/dL (ref 6–23)
CO2: 22 meq/L (ref 19–32)
CREATININE: 0.6 mg/dL (ref 0.50–1.10)
Calcium: 9.7 mg/dL (ref 8.4–10.5)
Chloride: 99 mEq/L (ref 96–112)
GLUCOSE: 101 mg/dL — AB (ref 70–99)
Potassium: 3.5 mEq/L — ABNORMAL LOW (ref 3.7–5.3)
Sodium: 136 mEq/L — ABNORMAL LOW (ref 137–147)
Total Bilirubin: 0.4 mg/dL (ref 0.3–1.2)
Total Protein: 8.4 g/dL — ABNORMAL HIGH (ref 6.0–8.3)

## 2014-03-06 LAB — URINALYSIS, ROUTINE W REFLEX MICROSCOPIC
Bilirubin Urine: NEGATIVE
Glucose, UA: NEGATIVE mg/dL
KETONES UR: NEGATIVE mg/dL
NITRITE: NEGATIVE
PROTEIN: NEGATIVE mg/dL
Specific Gravity, Urine: 1.009 (ref 1.005–1.030)
Urobilinogen, UA: 0.2 mg/dL (ref 0.0–1.0)
pH: 5.5 (ref 5.0–8.0)

## 2014-03-06 LAB — URINE MICROSCOPIC-ADD ON

## 2014-03-06 LAB — LIPASE, BLOOD: LIPASE: 42 U/L (ref 11–59)

## 2014-03-06 MED ORDER — HYDROMORPHONE HCL 1 MG/ML IJ SOLN
1.0000 mg | Freq: Once | INTRAMUSCULAR | Status: AC
  Administered 2014-03-06: 1 mg via INTRAVENOUS
  Filled 2014-03-06: qty 1

## 2014-03-06 MED ORDER — SODIUM CHLORIDE 0.9 % IV BOLUS (SEPSIS)
1000.0000 mL | Freq: Once | INTRAVENOUS | Status: AC
  Administered 2014-03-06: 1000 mL via INTRAVENOUS

## 2014-03-06 MED ORDER — ONDANSETRON HCL 4 MG/2ML IJ SOLN
4.0000 mg | Freq: Once | INTRAMUSCULAR | Status: AC
  Administered 2014-03-06: 4 mg via INTRAVENOUS
  Filled 2014-03-06: qty 2

## 2014-03-06 NOTE — ED Notes (Signed)
Pt reports severe weakness and R abd pain x 2 weeks.  Pt reports hx of diverticulitis. Pt is a Ca pt.  Last radiation tx was Monday.

## 2014-03-06 NOTE — Telephone Encounter (Signed)
[name] called and said she is not coming for treatment today.  She is going to the ER due to her weakness and increased pain.  She had a question about her $400.00 grant for her scat rides.  Advised her to call Lauren CSW about this.  Notified Lenora, RT on Linac 1 about the cancellation.

## 2014-03-06 NOTE — ED Provider Notes (Signed)
CSN: [id number]     Arrival date & time 03/06/14  1854 History   First MD Initiated Contact with Patient 03/06/14 2147     Chief Complaint  Patient presents with  . Weakness     (Consider location/radiation/quality/duration/timing/severity/associated sxs/prior Treatment) HPI The patient has a history of endometrial cancer and is undergoing radiation therapy. She reports she has been getting increasingly weak for several weeks duration. She has not specifically had fever, vomiting or diarrhea. She does however report decreased appetite. She reports she has abdominal pain. The pain is sharp and cramping and feels similar to when she got the diagnosis of diverticulitis. She reports she's had several stools yesterday although none had blood in them and not diarrheal. The patient has been having recurrent pain in association with her pelvic tumor but she is concerned that she may have a recurrence of diverticulitis which she had about a month ago.she reports she has been really struggling to keep going to radiation treatments with the amount of fatigue that she experiences. Past Medical History  Diagnosis Date  . Thyroid disease   . Arthritis   . Asthma   . Depression   . Fibromyalgia   . Lumbar spondylolysis   . Allergy   . Endometrial cancer   . Spinal stenosis   . Hypertension   . Dysrhythmia     OCCAS PAC'S AND PVC'S  PER HOLTER MONITOR STUDY --PT HAS OCCAS CHEST PAINS AND PALPITATIONS--HAD ECHO NORMAL LV SIZE AND FUNCTION.  Marland Kitchen Sleep apnea     HAS CPAP MACHINE BUT DOES NOT USE  . Anxiety     HAS OCD AND ON DISABILITY-PER [name]   . Back pain, chronic   . Obese    Past Surgical History  Procedure Laterality Date  . Cesarean section    . Abdominal hysterectomy      total   Family History  Problem Relation Age of Onset  . Hypertension Mother   . Coronary artery disease Father   . Cancer Brother     Renal cell carcinoma  . Hypertension Other   . Cancer Other   . Heart  attack Father   . Coronary artery disease Brother    History  Substance Use Topics  . Smoking status: Never Smoker   . Smokeless tobacco: Never Used  . Alcohol Use: No   OB History    Gravida Para Term Preterm AB TAB SAB Ectopic Multiple Living   1 1             Review of Systems  10 Systems reviewed and are negative for acute change except as noted in the HPI.   Allergies  Sulfa antibiotics and Sulfamethoxazole-trimethoprim  Home Medications   Prior to Admission medications   Medication Sig Start Date End Date Taking? Authorizing Provider  albuterol (VENTOLIN HFA) 108 (90 BASE) MCG/ACT inhaler Inhale 2 puffs into the lungs every 4 (four) hours as needed for wheezing or shortness of breath. 01/15/14  Yes Robbie Lis, MD  b complex vitamins capsule Take 1 capsule by mouth daily.   Yes Historical Provider, MD  ibuprofen (ADVIL,MOTRIN) 200 MG tablet Take 400 mg by mouth every 6 (six) hours as needed for moderate pain.   Yes Historical Provider, MD  levothyroxine (SYNTHROID, LEVOTHROID) 125 MCG tablet Take 1 tablet (125 mcg total) by mouth daily before breakfast. 01/15/14  Yes Robbie Lis, MD  LORazepam (ATIVAN) 1 MG tablet Take 1 tablet (1 mg total) by mouth 2 (  two) times daily. 02/23/12  Yes Shari A Upstill, PA-C  Methylsulfonylmethane (MSM) 1000 MG TABS Take 1 tablet by mouth daily.   Yes Historical Provider, MD  sertraline (ZOLOFT) 100 MG tablet Take 200 mg by mouth every evening.  10/15/11  Yes Srikar Janna Arch, MD  thiothixene (NAVANE) 2 MG capsule Take 1 capsule (2 mg total) by mouth at bedtime. 03/07/12  Yes Carlisle Cater, PA-C  Vitamin D, Ergocalciferol, (DRISDOL) 50000 UNITS CAPS capsule Take 50,000 Units by mouth every 7 (seven) days. No specific days   Yes Historical Provider, MD  amoxicillin-clavulanate (AUGMENTIN) 875-125 MG per tablet Take 1 tablet by mouth 2 (two) times daily. One po bid x 7 days 03/07/14   Charlesetta Shanks, MD  Multiple Vitamins-Minerals (MULTIVITAMIN)  tablet Take 1 tablet by mouth daily. Patient not taking: Reported on 03/04/2014 01/24/14   Kalman Drape, MD  nitrofurantoin, macrocrystal-monohydrate, (MACROBID) 100 MG capsule Take 1 capsule (100 mg total) by mouth 2 (two) times daily. Patient not taking: Reported on 03/04/2014 01/24/14   Kalman Drape, MD  ondansetron (ZOFRAN) 4 MG tablet Take 1 tablet (4 mg total) by mouth every 6 (six) hours as needed for nausea. Patient not taking: Reported on 03/04/2014 02/06/14   Blair Promise, MD  Oxycodone HCl 10 MG TABS Take 1 tablet (10 mg total) by mouth every 6 (six) hours as needed. Patient not taking: Reported on 03/04/2014 02/06/14   Blair Promise, MD   BP 122/66 mmHg  Pulse 79  Temp(Src) 98 F (36.7 C) (Oral)  Resp 18  SpO2 98% Physical Exam  Constitutional: She is oriented to person, place, and time.  The patient is mildly obese. She is nontoxic and alert. Her color is good and she has no respiratory distress.  HENT:  Head: Normocephalic and atraumatic.  Eyes: EOM are normal. Pupils are equal, round, and reactive to light. No scleral icterus.  Cardiovascular: Normal rate, regular rhythm, normal heart sounds and intact distal pulses.   Pulmonary/Chest: Effort normal and breath sounds normal. No respiratory distress. She has no wheezes. She has no rales.  Abdominal: Soft. Bowel sounds are normal.  Patient has lower abdominal tenderness. I don't appreciate specific mass. She endorses tenderness predominantly in the right lower and suprapubic region.  Musculoskeletal: She exhibits no edema or tenderness.  Neurological: She is alert and oriented to person, place, and time. Coordination normal.  Skin: Skin is warm and dry. No rash noted.  Psychiatric: She has a normal mood and affect.    ED Course  Procedures (including critical care time) Labs Review Labs Reviewed  COMPREHENSIVE METABOLIC PANEL - Abnormal; Notable for the following:    Sodium 136 (*)    Potassium 3.5 (*)    Glucose,  Bld 101 (*)    Total Protein 8.4 (*)    All other components within normal limits  URINALYSIS, ROUTINE W REFLEX MICROSCOPIC - Abnormal; Notable for the following:    APPearance CLOUDY (*)    Hgb urine dipstick LARGE (*)    Leukocytes, UA LARGE (*)    All other components within normal limits  URINE MICROSCOPIC-ADD ON - Abnormal; Notable for the following:    Squamous Epithelial / LPF FEW (*)    Bacteria, UA FEW (*)    All other components within normal limits  URINE CULTURE  CBC WITH DIFFERENTIAL  LIPASE, BLOOD    Imaging Review Ct Renal Stone Study  03/07/2014   CLINICAL DATA:  Severe weakness and right abdominal  pain for 2 weeks.  EXAM: CT ABDOMEN AND PELVIS WITHOUT CONTRAST  TECHNIQUE: Multidetector CT imaging of the abdomen and pelvis was performed following the standard protocol without IV contrast.  COMPARISON:  01/06/2014  FINDINGS: Kidneys are symmetrical in size and shape. No hydronephrosis or hydroureter. No renal, ureteral, or bladder stones identified. There is asymmetric thickening of the left posterior lateral bladder wall with associated scarring in the left lower quadrant fat. This is in an area where a mass was previously located and presumably represents postoperative change. Residual or recurrent mass lesion is not entirely excluded.  Spleen is enlarged. Unenhanced appearance of the liver, gallbladder, pancreas, adrenal glands, abdominal aorta, inferior vena cava, and retroperitoneal lymph nodes is unremarkable. Stomach and small bowel are decompressed. Stool-filled colon without distention or wall thickening. No free air or free fluid in the abdomen. Small umbilical hernia containing fat.  Pelvis: Residual enlarged lymph nodes in the superior iliac chains, largest on the right measuring 14 mm diameter. This was present previously and probably represents metastasis or possibly reactive node. Focal gas in the region of the vagina the may represent physiologic gas although given  the history of recent mass and presumed surgical resection and prior diverticulitis in this area, colo-vaginal fistula is not excluded. No definite abscess. Appendix is normal. No evidence of significant residual diverticulitis. Degenerative changes in the lumbar spine. Vacuum disc at L4-5 with evidence of posterior disc protrusion. No destructive bone lesions appreciated.  IMPRESSION: No renal or ureteral stone or obstruction. Asymmetric wall thickening in the posterior bladder with infiltration in the pelvic fat, gas in the vagina, and enlarged lymph nodes. Changes may represent metastatic and/or postoperative changes. Colo-vaginal fistula is not excluded.   Electronically Signed   By: Lucienne Capers M.D.   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 The patient is afebrile and without leukocytosis. Her urinalysis however is positive for UTI. The CT shows possible advancing of her endometrial cancer. At this time I do not think these findings are acute. The plan will be to initiate treatment for urinary tract infection and have the patient call her oncologist in the morning to discuss ongoing care.    Charlesetta Shanks, MD 03/07/14 [id number]

## 2014-03-06 NOTE — ED Notes (Signed)
Patient states she experiencing lower abd pain that radiates to lower back. Denies any nausea, vomiting, and fever last 3 nights (99.3 oral). Pt states the pain is" going for weeks." Also, complains of generalized weakness.

## 2014-03-07 ENCOUNTER — Ambulatory Visit: Payer: Medicaid Other

## 2014-03-07 ENCOUNTER — Telehealth: Payer: Self-pay | Admitting: Oncology

## 2014-03-07 MED ORDER — AMOXICILLIN-POT CLAVULANATE 875-125 MG PO TABS: 1.0000 | ORAL_TABLET | Freq: Two times a day (BID) | ORAL | Status: DC

## 2014-03-07 MED ORDER — CEFTRIAXONE SODIUM 1 G IJ SOLR
1.0000 g | Freq: Once | INTRAMUSCULAR | Status: AC
  Administered 2014-03-07: 1 g via INTRAVENOUS
  Filled 2014-03-07: qty 10

## 2014-03-07 NOTE — Telephone Encounter (Signed)
[name] called and asked why we had not called her today.  Advised her that we did not want to wake her since she was in the ER late last night.  [name] said she is not coming for treatment today.  She said she is concerned because the ER doctor told her that her cancer is different on the CT scan she had last night.  She would like to talk to Dr. [name] about this.  She said that the pain in her right side is radiating around to her back and also that her rectal area is aching.  She said she has not been taking oxycodone because it was keeping her awake.  She has been taking ibuprofen.  She did have dilaudid last night in the ER and said it took away almost all of her pain.  She said she is going to need something stronger as her cancer pain gets worse.  She said she would not be coming tomorrow either for treatment.  Advised her that I would discuss the CT results with Dr. [name] and that we would call her back.

## 2014-03-07 NOTE — Discharge Instructions (Signed)
Urinary Tract Infection Urinary tract infections (UTIs) can develop anywhere along your urinary tract. Your urinary tract is your body's drainage system for removing wastes and extra water. Your urinary tract includes two kidneys, two ureters, a bladder, and a urethra. Your kidneys are a pair of bean-shaped organs. Each kidney is about the size of your fist. They are located below your ribs, one on each side of your spine. CAUSES Infections are caused by microbes, which are microscopic organisms, including fungi, viruses, and bacteria. These organisms are so small that they can only be seen through a microscope. Bacteria are the microbes that most commonly cause UTIs. SYMPTOMS  Symptoms of UTIs may vary by age and gender of the patient and by the location of the infection. Symptoms in young women typically include a frequent and intense urge to urinate and a painful, burning feeling in the bladder or urethra during urination. Older women and men are more likely to be tired, shaky, and weak and have muscle aches and abdominal pain. A fever may mean the infection is in your kidneys. Other symptoms of a kidney infection include pain in your back or sides below the ribs, nausea, and vomiting. DIAGNOSIS To diagnose a UTI, your caregiver will ask you about your symptoms. Your caregiver also will ask to provide a urine sample. The urine sample will be tested for bacteria and white blood cells. White blood cells are made by your body to help fight infection. TREATMENT  Typically, UTIs can be treated with medication. Because most UTIs are caused by a bacterial infection, they usually can be treated with the use of antibiotics. The choice of antibiotic and length of treatment depend on your symptoms and the type of bacteria causing your infection. HOME CARE INSTRUCTIONS  If you were prescribed antibiotics, take them exactly as your caregiver instructs you. Finish the medication even if you feel better after you  have only taken some of the medication.  Drink enough water and fluids to keep your urine clear or pale yellow.  Avoid caffeine, tea, and carbonated beverages. They tend to irritate your bladder.  Empty your bladder often. Avoid holding urine for long periods of time.  Empty your bladder before and after sexual intercourse.  After a bowel movement, women should cleanse from front to back. Use each tissue only once. SEEK MEDICAL CARE IF:   You have back pain.  You develop a fever.  Your symptoms do not begin to resolve within 3 days. SEEK IMMEDIATE MEDICAL CARE IF:   You have severe back pain or lower abdominal pain.  You develop chills.  You have nausea or vomiting.  You have continued burning or discomfort with urination. MAKE SURE YOU:   Understand these instructions.  Will watch your condition.  Will get help right away if you are not doing well or get worse. Document Released: 12/23/2004 Document Revised: 09/14/2011 Document Reviewed: 04/23/2011 [facility] Patient Information 2015 [place], [place]. This information is not intended to replace advice given to you by your health care provider. Make sure you discuss any questions you have with your health care provider.  YOU HAVE ABNORMALITIES ON YOUR CT SCAN THAT WILL NEED TO BE REVIEWED BY YOUR ONCOLOGIST TO DETERMINE IF YOUR CANCER IS WORSENING. CALL YOUR ONCOLOGIST IN THE MORNING TO DISCUSS YOUR CARE.

## 2014-03-08 ENCOUNTER — Ambulatory Visit: Payer: Medicaid Other

## 2014-03-08 ENCOUNTER — Telehealth: Payer: Self-pay | Admitting: Oncology

## 2014-03-08 NOTE — Telephone Encounter (Signed)
Called [name] to see if she is coming for treatment today.  She said she is not coming.  She said she took Suboxon that she had left over from the pain clinic last nightfor pain in her lower abdomen that is radiating around her left side.  She said it helped and is wondering if Dr. [name] would be able to prescribe her more.  Advised her that she will need to ask Dr. [name] at her next appointment.  She is not taking oxycodone.  Advised her to call [name] to make her appointments for next week.  Kynzie said she would and is going to keep all her appointments starting on Monday.  Advised her that I would cancel her [name] ride today.

## 2014-03-11 ENCOUNTER — Ambulatory Visit: Payer: Medicaid Other

## 2014-03-11 ENCOUNTER — Telehealth: Payer: Self-pay | Admitting: Oncology

## 2014-03-11 NOTE — Telephone Encounter (Signed)
Called [name] because she did not call or show up for treatment today.  [name] said she is not well and that her pain is worse in her lower pelvic area and it is radiating to her right hip and down her left leg.  She said the only thing that helped relieve the pain was the dilaudid she had in the Emergency Room.  She said the Soboxon helped a little but she does not have anymore.  She tried taking oxycodone last night without relief. She reported that she started taking the antibiotic for her UTI yesterday.  She has been reading the cancer patient's bill of rights online and said she is going to need something stronger for pain. She said that "it is not right that no one is helping her."  Advised her that she will need to come in for her radiation treatment and talk to Dr. [name] about her pain medication.  [name] said she did not call [name] to make an appointment for a ride tomorrow and her friend can't take her.  When asked if she could call [name] in the morning, she said she does not get up until 11:00.  Advised her that she needs to call [name] because she is only allowed so many no shows and cancellations before she will not be able to use it anymore.  [name] verbalized agreement.  Advised her that she needs to come in tomorrow to see Dr. [name] and to call in the morning to let us know if she is coming or not.

## 2014-03-12 ENCOUNTER — Ambulatory Visit: Payer: Medicaid Other | Admitting: Radiation Oncology

## 2014-03-12 ENCOUNTER — Ambulatory Visit: Payer: Medicaid Other

## 2014-03-13 ENCOUNTER — Ambulatory Visit: Payer: Medicaid Other

## 2014-03-13 ENCOUNTER — Telehealth: Payer: Self-pay | Admitting: Oncology

## 2014-03-13 NOTE — Telephone Encounter (Signed)
Roderick called and said she is not coming for treatment today.  She is going to have her friend bring her tomorrow.  Notified Stephanie, RT on linac 1.

## 2014-03-14 ENCOUNTER — Ambulatory Visit: Payer: Medicaid Other | Admitting: Radiation Oncology

## 2014-03-14 ENCOUNTER — Ambulatory Visit: Payer: Medicaid Other

## 2014-03-15 ENCOUNTER — Ambulatory Visit: Payer: Medicaid Other

## 2014-03-15 ENCOUNTER — Telehealth: Payer: Self-pay | Admitting: Oncology

## 2014-03-15 NOTE — Telephone Encounter (Signed)
[name] with no answer.

## 2014-03-18 ENCOUNTER — Ambulatory Visit: Payer: Medicaid Other

## 2014-03-18 ENCOUNTER — Telehealth: Payer: Self-pay | Admitting: Oncology

## 2014-03-18 NOTE — Telephone Encounter (Signed)
Jayleene called, crying, saying that she is having a lot of pain in her stomach, back and rectal area.  She said it feels like her insides are going to fall out.  She said she has not had a bowel movement since Friday.  She was having diarrhea before Friday and had been taking a medication to stop diarrhea that her friend brought.  She did not know the name of it.  She reported the pain is different than the pain she had before.  She is not taking any medication for pain at this time.  She said it feels like gas pain and constipation.  She also said she is feeling "down and not wanting to live."  She is not sure that she is going to come in today due to feeling bad.  Asked if she has any stool softeners or laxatives.  Anapaola said that she did but was afraid to take them.  Asked if she had finished her antibiotics from her UTI.  She said she had stopped taking them because they made her feel sick.  Advised her that she can either come in for her treatment today or go to the ER again.  Diksha said she was afraid of what the ER would do.  She said she is going to try eating breakfast and then will call back to let us know if she is coming for treatment today.    Called Polo Riley, CSW and advised her of what Emersyn said about feeling down and not wanting to live.

## 2014-03-19 ENCOUNTER — Telehealth: Payer: Self-pay | Admitting: Oncology

## 2014-03-19 ENCOUNTER — Ambulatory Visit: Payer: Medicaid Other | Admitting: Radiation Oncology

## 2014-03-19 ENCOUNTER — Ambulatory Visit: Payer: Medicaid Other

## 2014-03-19 NOTE — Telephone Encounter (Signed)
Shadiyah called and said she is not coming today.  She called [facility] for tomorrow and was told pick up time was at 1:30.  She said this is too early and would like [name] to call to make the appointment.  Advised her that I would call [facility] and call her back.

## 2014-03-19 NOTE — Telephone Encounter (Signed)
[name] called back and said she does not want to restart treatments until 03/25/14 and would like [name] to make her [facility] appointment.

## 2014-03-20 ENCOUNTER — Ambulatory Visit: Payer: Medicaid Other

## 2014-03-21 ENCOUNTER — Ambulatory Visit: Payer: Medicaid Other

## 2014-03-21 ENCOUNTER — Telehealth: Payer: Self-pay | Admitting: Oncology

## 2014-03-21 NOTE — Telephone Encounter (Signed)
Called [name] and advised her of her [facility] pick up times for Monday-Wednesday next week.  Braniya verbalized.

## 2014-03-25 ENCOUNTER — Encounter: Payer: Self-pay | Admitting: *Deleted

## 2014-03-25 ENCOUNTER — Encounter (HOSPITAL_COMMUNITY): Payer: Self-pay | Admitting: Nurse Practitioner

## 2014-03-25 ENCOUNTER — Emergency Department (HOSPITAL_COMMUNITY)
Admission: EM | Admit: 2014-03-25 | Discharge: 2014-03-26 | Disposition: A | Discharge status: Home / Self Care | Payer: Medicaid Other | Attending: Emergency Medicine | Admitting: Emergency Medicine

## 2014-03-25 ENCOUNTER — Ambulatory Visit: Payer: Medicaid Other

## 2014-03-25 DIAGNOSIS — Z90710 Acquired absence of both cervix and uterus: Secondary | ICD-10-CM | POA: Diagnosis not present

## 2014-03-25 DIAGNOSIS — M1990 Unspecified osteoarthritis, unspecified site: Secondary | ICD-10-CM | POA: Insufficient documentation

## 2014-03-25 DIAGNOSIS — J45909 Unspecified asthma, uncomplicated: Secondary | ICD-10-CM | POA: Insufficient documentation

## 2014-03-25 DIAGNOSIS — E669 Obesity, unspecified: Secondary | ICD-10-CM | POA: Insufficient documentation

## 2014-03-25 DIAGNOSIS — I10 Essential (primary) hypertension: Secondary | ICD-10-CM | POA: Insufficient documentation

## 2014-03-25 DIAGNOSIS — N390 Urinary tract infection, site not specified: Secondary | ICD-10-CM | POA: Diagnosis not present

## 2014-03-25 DIAGNOSIS — E079 Disorder of thyroid, unspecified: Secondary | ICD-10-CM | POA: Diagnosis not present

## 2014-03-25 DIAGNOSIS — Z79899 Other long term (current) drug therapy: Secondary | ICD-10-CM | POA: Diagnosis not present

## 2014-03-25 DIAGNOSIS — G8929 Other chronic pain: Secondary | ICD-10-CM | POA: Diagnosis not present

## 2014-03-25 DIAGNOSIS — F329 Major depressive disorder, single episode, unspecified: Secondary | ICD-10-CM | POA: Insufficient documentation

## 2014-03-25 DIAGNOSIS — F419 Anxiety disorder, unspecified: Secondary | ICD-10-CM | POA: Insufficient documentation

## 2014-03-25 DIAGNOSIS — R109 Unspecified abdominal pain: Secondary | ICD-10-CM

## 2014-03-25 DIAGNOSIS — M797 Fibromyalgia: Secondary | ICD-10-CM | POA: Diagnosis not present

## 2014-03-25 DIAGNOSIS — Z9889 Other specified postprocedural states: Secondary | ICD-10-CM | POA: Diagnosis not present

## 2014-03-25 DIAGNOSIS — Z8589 Personal history of malignant neoplasm of other organs and systems: Secondary | ICD-10-CM | POA: Insufficient documentation

## 2014-03-25 DIAGNOSIS — R102 Pelvic and perineal pain: Secondary | ICD-10-CM | POA: Diagnosis present

## 2014-03-25 DIAGNOSIS — G4730 Sleep apnea, unspecified: Secondary | ICD-10-CM | POA: Diagnosis not present

## 2014-03-25 NOTE — ED Notes (Signed)
Bed: WA21 Expected date:  Expected time:  Means of arrival:  Comments: EMS abd pain, hx of vaginal CA

## 2014-03-25 NOTE — ED Notes (Signed)
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 Pt also c/o diarrhea. In NAD.

## 2014-03-26 ENCOUNTER — Ambulatory Visit: Payer: Medicaid Other

## 2014-03-26 ENCOUNTER — Ambulatory Visit: Payer: Medicaid Other | Attending: Radiation Oncology | Admitting: Radiation Oncology

## 2014-03-26 ENCOUNTER — Encounter: Payer: Self-pay | Admitting: Obstetrics & Gynecology

## 2014-03-26 LAB — URINALYSIS, ROUTINE W REFLEX MICROSCOPIC
BILIRUBIN URINE: NEGATIVE
Glucose, UA: NEGATIVE mg/dL
Ketones, ur: NEGATIVE mg/dL
NITRITE: NEGATIVE
PH: 5.5 (ref 5.0–8.0)
Protein, ur: NEGATIVE mg/dL
SPECIFIC GRAVITY, URINE: 1.012 (ref 1.005–1.030)
Urobilinogen, UA: 0.2 mg/dL (ref 0.0–1.0)

## 2014-03-26 LAB — BASIC METABOLIC PANEL
ANION GAP: 6 (ref 5–15)
BUN: 7 mg/dL (ref 6–23)
CO2: 23 mmol/L (ref 19–32)
Calcium: 9.2 mg/dL (ref 8.4–10.5)
Chloride: 109 mEq/L (ref 96–112)
Creatinine, Ser: 0.62 mg/dL (ref 0.50–1.10)
GLUCOSE: 90 mg/dL (ref 70–99)
POTASSIUM: 3.8 mmol/L (ref 3.5–5.1)
Sodium: 138 mmol/L (ref 135–145)

## 2014-03-26 LAB — URINE MICROSCOPIC-ADD ON

## 2014-03-26 LAB — CBC WITH DIFFERENTIAL/PLATELET
Basophils Absolute: 0 10*3/uL (ref 0.0–0.1)
Basophils Relative: 0 % (ref 0–1)
EOS ABS: 0.8 10*3/uL — AB (ref 0.0–0.7)
Eosinophils Relative: 15 % — ABNORMAL HIGH (ref 0–5)
HCT: 37.1 % (ref 36.0–46.0)
Hemoglobin: 12.5 g/dL (ref 12.0–15.0)
Lymphocytes Relative: 29 % (ref 12–46)
Lymphs Abs: 1.7 10*3/uL (ref 0.7–4.0)
MCH: 29.1 pg (ref 26.0–34.0)
MCHC: 33.7 g/dL (ref 30.0–36.0)
MCV: 86.3 fL (ref 78.0–100.0)
MONOS PCT: 9 % (ref 3–12)
Monocytes Absolute: 0.5 10*3/uL (ref 0.1–1.0)
Neutro Abs: 2.7 10*3/uL (ref 1.7–7.7)
Neutrophils Relative %: 47 % (ref 43–77)
Platelets: 162 10*3/uL (ref 150–400)
RBC: 4.3 MIL/uL (ref 3.87–5.11)
RDW: 15 % (ref 11.5–15.5)
WBC: 5.8 10*3/uL (ref 4.0–10.5)

## 2014-03-26 MED ORDER — LIDOCAINE HCL (PF) 1 % IJ SOLN
INTRAMUSCULAR | Status: AC
  Administered 2014-03-26: 5 mL
  Filled 2014-03-26: qty 5

## 2014-03-26 MED ORDER — HYDROMORPHONE HCL 1 MG/ML IJ SOLN: 1.0000 mg | Freq: Once | INTRAMUSCULAR | Status: DC

## 2014-03-26 MED ORDER — CEPHALEXIN 500 MG PO CAPS: 500.0000 mg | ORAL_CAPSULE | Freq: Four times a day (QID) | ORAL | Status: DC

## 2014-03-26 MED ORDER — HYDROMORPHONE HCL 1 MG/ML IJ SOLN
1.0000 mg | Freq: Once | INTRAMUSCULAR | Status: AC
  Administered 2014-03-26: 1 mg via INTRAMUSCULAR
  Filled 2014-03-26: qty 1

## 2014-03-26 MED ORDER — LIDOCAINE HCL (PF) 1 % IJ SOLN
INTRAMUSCULAR | Status: DC
Start: 2014-03-26 — End: 2014-03-26
  Filled 2014-03-26: qty 5

## 2014-03-26 MED ORDER — CEFTRIAXONE SODIUM 1 G IJ SOLR
1.0000 g | Freq: Once | INTRAMUSCULAR | Status: AC
  Administered 2014-03-26: 1 g via INTRAMUSCULAR
  Filled 2014-03-26: qty 10

## 2014-03-26 NOTE — Discharge Instructions (Signed)
Abdominal Pain °Many things can cause abdominal pain. Usually, abdominal pain is not caused by a disease and will improve without treatment. It can often be observed and treated at home. Your health care provider will do a physical exam and possibly order blood tests and X-rays to help determine the seriousness of your pain. However, in many cases, more time must pass before a clear cause of the pain can be found. Before that point, your health care provider may not know if you need more testing or further treatment. °HOME CARE INSTRUCTIONS  °Monitor your abdominal pain for any changes. The following actions may help to alleviate any discomfort you are experiencing: °· Only take over-the-counter or prescription medicines as directed by your health care provider. °· Do not take laxatives unless directed to do so by your health care provider. °· Try a clear liquid diet (broth, tea, or water) as directed by your health care provider. Slowly move to a bland diet as tolerated. °SEEK MEDICAL CARE IF: °· You have unexplained abdominal pain. °· You have abdominal pain associated with nausea or diarrhea. °· You have pain when you urinate or have a bowel movement. °· You experience abdominal pain that wakes you in the night. °· You have abdominal pain that is worsened or improved by eating food. °· You have abdominal pain that is worsened with eating fatty foods. °· You have a fever. °SEEK IMMEDIATE MEDICAL CARE IF:  °· Your pain does not go away within 2 hours. °· You keep throwing up (vomiting). °· Your pain is felt only in portions of the abdomen, such as the right side or the left lower portion of the abdomen. °· You pass bloody or black tarry stools. °MAKE SURE YOU: °· Understand these instructions.   °· Will watch your condition.   °· Will get help right away if you are not doing well or get worse.   °Document Released: 12/23/2004 Document Revised: 03/20/2013 Document Reviewed: 11/22/2012 °[employer]® Patient Information  ©2015 [employer]. This information is not intended to replace advice given to you by your health care provider. Make sure you discuss any questions you have with your health care provider. °Urinary Tract Infection °Urinary tract infections (UTIs) can develop anywhere along your urinary tract. Your urinary tract is your body's drainage system for removing wastes and extra water. Your urinary tract includes two kidneys, two ureters, a bladder, and a urethra. Your kidneys are a pair of bean-shaped organs. Each kidney is about the size of your fist. They are located below your ribs, one on each side of your spine. °CAUSES °Infections are caused by microbes, which are microscopic organisms, including fungi, viruses, and bacteria. These organisms are so small that they can only be seen through a microscope. Bacteria are the microbes that most commonly cause UTIs. °SYMPTOMS  °Symptoms of UTIs may vary by age and gender of the patient and by the location of the infection. Symptoms in young women typically include a frequent and intense urge to urinate and a painful, burning feeling in the bladder or urethra during urination. Older women and men are more likely to be tired, shaky, and weak and have muscle aches and abdominal pain. A fever may mean the infection is in your kidneys. Other symptoms of a kidney infection include pain in your back or sides below the ribs, nausea, and vomiting. °DIAGNOSIS °To diagnose a UTI, your caregiver will ask you about your symptoms. Your caregiver also will ask to provide a urine sample. The urine sample   will be tested for bacteria and white blood cells. White blood cells are made by your body to help fight infection. °TREATMENT  °Typically, UTIs can be treated with medication. Because most UTIs are caused by a bacterial infection, they usually can be treated with the use of antibiotics. The choice of antibiotic and length of treatment depend on your symptoms and the type of bacteria  causing your infection. °HOME CARE INSTRUCTIONS °· If you were prescribed antibiotics, take them exactly as your caregiver instructs you. Finish the medication even if you feel better after you have only taken some of the medication. °· Drink enough water and fluids to keep your urine clear or pale yellow. °· Avoid caffeine, tea, and carbonated beverages. They tend to irritate your bladder. °· Empty your bladder often. Avoid holding urine for long periods of time. °· Empty your bladder before and after sexual intercourse. °· After a bowel movement, women should cleanse from front to back. Use each tissue only once. °SEEK MEDICAL CARE IF:  °· You have back pain. °· You develop a fever. °· Your symptoms do not begin to resolve within 3 days. °SEEK IMMEDIATE MEDICAL CARE IF:  °· You have severe back pain or lower abdominal pain. °· You develop chills. °· You have nausea or vomiting. °· You have continued burning or discomfort with urination. °MAKE SURE YOU:  °· Understand these instructions. °· Will watch your condition. °· Will get help right away if you are not doing well or get worse. °Document Released: 12/23/2004 Document Revised: 09/14/2011 Document Reviewed: 04/23/2011 °ExitCare® Patient Information ©2015 [employer]. This information is not intended to replace advice given to you by your health care provider. Make sure you discuss any questions you have with your health care provider. ° °

## 2014-03-26 NOTE — ED Provider Notes (Signed)
CSN: [id number]     Arrival date & time 03/25/14  2257 History   First MD Initiated Contact with Patient 03/26/14 0045     Chief Complaint  Patient presents with  . Vaginal Pain     (Consider location/radiation/quality/duration/timing/severity/associated sxs/prior Treatment) Patient is a 58 y.o. female presenting with vaginal pain. The history is provided by the patient. No language interpreter was used.  Vaginal Pain This is a chronic problem. Pertinent negatives include no chills, fever, nausea or vomiting. Associated symptoms comments: The patient is complaining of abdominal pain below the umbilicus that she reports is pain associated with her recurrent endometrial cancer. She states she is undergoing radiation treatments now with Dr. [name] who has provided oxycodone for pain. She states she is not taking it because it does not control her pain. No fever. She denies urinary symptoms of dysuria but reports infrequent hematuria. The pain radiates around to the RLQ abdomen and into the right lower back. She states she has had diarrhea but this ended 2 weeks ago. She is not vomiting. .    Past Medical History  Diagnosis Date  . Thyroid disease   . Arthritis   . Asthma   . Depression   . Fibromyalgia   . Lumbar spondylolysis   . Allergy   . Endometrial cancer   . Spinal stenosis   . Hypertension   . Dysrhythmia     OCCAS PAC'S AND PVC'S  PER HOLTER MONITOR STUDY --PT HAS OCCAS CHEST PAINS AND PALPITATIONS--HAD ECHO NORMAL LV SIZE AND FUNCTION.  Marland Kitchen Sleep apnea     HAS CPAP MACHINE BUT DOES NOT USE  . Anxiety     HAS OCD AND ON DISABILITY-PER [name]   . Back pain, chronic   . Obese    Past Surgical History  Procedure Laterality Date  . Cesarean section    . Abdominal hysterectomy      total   Family History  Problem Relation Age of Onset  . Hypertension Mother   . Coronary artery disease Father   . Cancer Brother     Renal cell carcinoma  . Hypertension Other   .  Cancer Other   . Heart attack Father   . Coronary artery disease Brother    History  Substance Use Topics  . Smoking status: Never Smoker   . Smokeless tobacco: Never Used  . Alcohol Use: No   OB History    Gravida Para Term Preterm AB TAB SAB Ectopic Multiple Living   1 1             Review of Systems  Constitutional: Negative for fever and chills.  HENT: Negative.   Respiratory: Negative.   Cardiovascular: Negative.   Gastrointestinal: Negative.  Negative for nausea and vomiting.  Genitourinary: Positive for hematuria, vaginal pain and pelvic pain.  Musculoskeletal: Negative.   Skin: Negative.   Neurological: Negative.       Allergies  Sulfa antibiotics and Sulfamethoxazole-trimethoprim  Home Medications   Prior to Admission medications   Medication Sig Start Date End Date Taking? Authorizing Provider  b complex vitamins capsule Take 1 capsule by mouth daily.   Yes Historical Provider, MD  ibuprofen (ADVIL,MOTRIN) 200 MG tablet Take 400 mg by mouth every 6 (six) hours as needed for moderate pain.   Yes Historical Provider, MD  levothyroxine (SYNTHROID, LEVOTHROID) 125 MCG tablet Take 1 tablet (125 mcg total) by mouth daily before breakfast. 01/15/14  Yes Robbie Lis, MD  LORazepam (ATIVAN)  1 MG tablet Take 1 tablet (1 mg total) by mouth 2 (two) times daily. 02/23/12  Yes Stephania Macfarlane A Latera Mclin, PA-C  Methylsulfonylmethane (MSM) 1000 MG TABS Take 1 tablet by mouth daily.   Yes Historical Provider, MD  sertraline (ZOLOFT) 100 MG tablet Take 200 mg by mouth every evening.  10/15/11  Yes Srikar Janna Arch, MD  thiothixene (NAVANE) 2 MG capsule Take 1 capsule (2 mg total) by mouth at bedtime. 03/07/12  Yes Carlisle Cater, PA-C  Vitamin D, Ergocalciferol, (DRISDOL) 50000 UNITS CAPS capsule Take 50,000 Units by mouth every 7 (seven) days. No specific days   Yes Historical Provider, MD  albuterol (VENTOLIN HFA) 108 (90 BASE) MCG/ACT inhaler Inhale 2 puffs into the lungs every 4 (four)  hours as needed for wheezing or shortness of breath. Patient not taking: Reported on 03/25/2014 01/15/14   Robbie Lis, MD  amoxicillin-clavulanate (AUGMENTIN) 875-125 MG per tablet Take 1 tablet by mouth 2 (two) times daily. One po bid x 7 days Patient not taking: Reported on 03/25/2014 03/07/14   Charlesetta Shanks, MD  Multiple Vitamins-Minerals (MULTIVITAMIN) tablet Take 1 tablet by mouth daily. Patient not taking: Reported on 03/04/2014 01/24/14   Kalman Drape, MD  nitrofurantoin, macrocrystal-monohydrate, (MACROBID) 100 MG capsule Take 1 capsule (100 mg total) by mouth 2 (two) times daily. Patient not taking: Reported on 03/04/2014 01/24/14   Kalman Drape, MD  ondansetron (ZOFRAN) 4 MG tablet Take 1 tablet (4 mg total) by mouth every 6 (six) hours as needed for nausea. Patient not taking: Reported on 03/04/2014 02/06/14   Blair Promise, MD  Oxycodone HCl 10 MG TABS Take 1 tablet (10 mg total) by mouth every 6 (six) hours as needed. Patient not taking: Reported on 03/04/2014 02/06/14   Blair Promise, MD   BP 124/82 mmHg  Pulse 71  Temp(Src) 98.8 F (37.1 C) (Oral)  Resp 18  SpO2 97% Physical Exam  Constitutional: She appears well-developed and well-nourished.  HENT:  Head: Normocephalic.  Neck: Normal range of motion. Neck supple.  Cardiovascular: Normal rate and regular rhythm.   Pulmonary/Chest: Effort normal and breath sounds normal.  Abdominal: Soft. Bowel sounds are normal. There is tenderness. There is no rebound and no guarding.  Tender to lower abdomen. No palpable mass.   Genitourinary:  External vagina without visualized abnormalities. Rectum appears normal.  Musculoskeletal: Normal range of motion.  Neurological: She is alert. No cranial nerve deficit.  Skin: Skin is warm and dry. No rash noted.  Psychiatric: She has a normal mood and affect.    ED Course  Procedures (including critical care time) Labs Review Labs Reviewed  URINALYSIS, ROUTINE W REFLEX MICROSCOPIC   CBC WITH DIFFERENTIAL  BASIC METABOLIC PANEL   Results for orders placed or performed during the hospital encounter of 03/25/14  Urinalysis, Routine w reflex microscopic  Result Value Ref Range   Color, Urine YELLOW YELLOW   APPearance CLOUDY (A) CLEAR   Specific Gravity, Urine 1.012 1.005 - 1.030   pH 5.5 5.0 - 8.0   Glucose, UA NEGATIVE NEGATIVE mg/dL   Hgb urine dipstick MODERATE (A) NEGATIVE   Bilirubin Urine NEGATIVE NEGATIVE   Ketones, ur NEGATIVE NEGATIVE mg/dL   Protein, ur NEGATIVE NEGATIVE mg/dL   Urobilinogen, UA 0.2 0.0 - 1.0 mg/dL   Nitrite NEGATIVE NEGATIVE   Leukocytes, UA MODERATE (A) NEGATIVE  CBC with Differential  Result Value Ref Range   WBC 5.8 4.0 - 10.5 K/uL   RBC 4.30 3.87 -  5.11 MIL/uL   Hemoglobin 12.5 12.0 - 15.0 g/dL   HCT 37.1 36.0 - 46.0 %   MCV 86.3 78.0 - 100.0 fL   MCH 29.1 26.0 - 34.0 pg   MCHC 33.7 30.0 - 36.0 g/dL   RDW 15.0 11.5 - 15.5 %   Platelets 162 150 - 400 K/uL   Neutrophils Relative % 47 43 - 77 %   Neutro Abs 2.7 1.7 - 7.7 K/uL   Lymphocytes Relative 29 12 - 46 %   Lymphs Abs 1.7 0.7 - 4.0 K/uL   Monocytes Relative 9 3 - 12 %   Monocytes Absolute 0.5 0.1 - 1.0 K/uL   Eosinophils Relative 15 (H) 0 - 5 %   Eosinophils Absolute 0.8 (H) 0.0 - 0.7 K/uL   Basophils Relative 0 0 - 1 %   Basophils Absolute 0.0 0.0 - 0.1 K/uL  Basic metabolic panel  Result Value Ref Range   Sodium 138 135 - 145 mmol/L   Potassium 3.8 3.5 - 5.1 mmol/L   Chloride 109 96 - 112 mEq/L   CO2 23 19 - 32 mmol/L   Glucose, Bld 90 70 - 99 mg/dL   BUN 7 6 - 23 mg/dL   Creatinine, Ser 0.62 0.50 - 1.10 mg/dL   Calcium 9.2 8.4 - 10.5 mg/dL   GFR calc non Af Amer >90 >90 mL/min   GFR calc Af Amer >90 >90 mL/min   Anion gap 6 5 - 15  Urine microscopic-add on  Result Value Ref Range   Squamous Epithelial / LPF RARE RARE   WBC, UA TOO NUMEROUS TO COUNT <3 WBC/hpf   RBC / HPF 3-6 <3 RBC/hpf   Bacteria, UA FEW (A) RARE    Imaging Review No results  found.   EKG Interpretation None      MDM   Final diagnoses:  None    1. Chronic abdominal pain 2. UTI  Chart reviewed. The patient is managed by Dr. [name]. There are multiple notes in the documentation that support patient noncompliance with radiation treatment appointments, and persistent and repeated calls regarding pain medication. She reports she has oxycodone at home but does not take it because "it does nothing for me".  She denies relationship with Pain Management, however, there is a reference to leftover suboxone she had from a pain clinic that "helped her pain better than oxycodone".   The patient has normal VS in ED. No vomiting. She has a UTI. Chart documentation also shows that she had been on antibiotics for UTI recently but that she quit taking the medication because she did not like side effects she was having. She will be encouraged to take the medication as prescribed. Urine culture pending.  Pain management provided in ED with IM Dilaudid. She will be encouraged to follow up with her doctor for further pain management in the outpatient setting.     Dewaine Oats, PA-C 03/26/14 1157  Orlie Dakin, MD 03/26/14 [phone]

## 2014-03-27 ENCOUNTER — Ambulatory Visit: Payer: Medicaid Other

## 2014-03-28 ENCOUNTER — Ambulatory Visit: Payer: Medicaid Other

## 2014-03-31 LAB — URINE CULTURE: Colony Count: >=100000

## 2014-04-01 ENCOUNTER — Telehealth: Payer: Self-pay | Admitting: Oncology

## 2014-04-01 ENCOUNTER — Ambulatory Visit: Payer: Medicaid Other

## 2014-04-01 ENCOUNTER — Telehealth (HOSPITAL_BASED_OUTPATIENT_CLINIC_OR_DEPARTMENT_OTHER): Payer: Self-pay | Admitting: Emergency Medicine

## 2014-04-01 NOTE — Progress Notes (Signed)
ED Antimicrobial Stewardship Positive Culture Follow Up   [name] is an 59 y.o. female who presented to [facility] on 03/25/2014 with a chief complaint of  Chief Complaint  Patient presents with  . Vaginal Pain    Recent Results (from the past 720 hour(s))  Urine culture     Status: None   Collection Time: 03/26/14  1:19 AM  Result Value Ref Range Status   Specimen Description URINE, CLEAN CATCH  Final   Special Requests NONE  Final   Colony Count   Final    >=100,000 COLONIES/ML Performed at [facility]    Culture   Final    STAPHYLOCOCCUS SPECIES (COAGULASE NEGATIVE) Note: RIFAMPIN AND GENTAMICIN SHOULD NOT BE USED AS SINGLE DRUGS FOR TREATMENT OF STAPH INFECTIONS. Performed at [facility]    Report Status 03/31/2014 FINAL  Final   Organism ID, Bacteria STAPHYLOCOCCUS SPECIES (COAGULASE NEGATIVE)  Final      Susceptibility   Staphylococcus species (coagulase negative) - MIC*    GENTAMICIN <=0.5 SENSITIVE Sensitive     LEVOFLOXACIN 4 INTERMEDIATE Intermediate     NITROFURANTOIN <=16 SENSITIVE Sensitive     OXACILLIN >=4 RESISTANT Resistant     PENICILLIN 0.25 RESISTANT Resistant     RIFAMPIN <=0.5 SENSITIVE Sensitive     TRIMETH/SULFA 80 RESISTANT Resistant     VANCOMYCIN <=0.5 SENSITIVE Sensitive     TETRACYCLINE <=1 SENSITIVE Sensitive     * STAPHYLOCOCCUS SPECIES (COAGULASE NEGATIVE)    [x]  Treated with cephalexin, organism resistant to prescribed antimicrobial []  Patient discharged originally without antimicrobial agent and treatment is now indicated  New antibiotic prescription: Macrobid 100 mg po BID x 5 days  ED Provider: Clemens Catholic, NP-C   Candie Mile 04/01/2014, 9:17 AM Infectious Diseases Pharmacist Phone# [phone]

## 2014-04-01 NOTE — Telephone Encounter (Signed)
[name] called stating that she will not be coming for radiation today due to not sleeping last night.  She reports that she went to the ER last week and has a UTI.  She was given Keflex but said she could not take it because it caused gas pain and burping.  She reports she will be coming for treatment tomorrow and will call [name] to make her appointment.

## 2014-04-02 ENCOUNTER — Telehealth (HOSPITAL_BASED_OUTPATIENT_CLINIC_OR_DEPARTMENT_OTHER): Payer: Self-pay | Admitting: Emergency Medicine

## 2014-04-02 ENCOUNTER — Ambulatory Visit: Payer: Medicaid Other

## 2014-04-02 ENCOUNTER — Ambulatory Visit: Payer: Medicaid Other | Admitting: Radiation Oncology

## 2014-04-02 ENCOUNTER — Telehealth: Payer: Self-pay | Admitting: Oncology

## 2014-04-02 NOTE — Telephone Encounter (Signed)
[name] called and said she was having a lot of pain last night and said it was too cold today to come for treatment.  She said she does not want to get sick from the cold.  She said she will be coming tomorrow.  She has had her car fixed and can drive herself.

## 2014-04-03 ENCOUNTER — Telehealth: Payer: Self-pay | Admitting: Oncology

## 2014-04-03 ENCOUNTER — Ambulatory Visit: Payer: Medicaid Other | Admitting: Radiation Oncology

## 2014-04-03 ENCOUNTER — Ambulatory Visit: Payer: Medicaid Other

## 2014-04-03 NOTE — Telephone Encounter (Signed)
Called [place] and there was no answer.

## 2014-04-04 ENCOUNTER — Ambulatory Visit: Payer: Medicaid Other

## 2014-04-05 ENCOUNTER — Telehealth: Payer: Self-pay | Admitting: Oncology

## 2014-04-05 ENCOUNTER — Ambulatory Visit: Payer: Medicaid Other

## 2014-04-05 NOTE — Telephone Encounter (Signed)
[name] called back and said that she is going to try and come for treatment on Monday.  She is having a lot of pain all around her lower abdomen/back and she needs to see Dr. [name].  She also is requesting a refill on synthroid because she has not been able to see her primary care doctor.  Advised her to come for treatment on Monday.  [name] verbalized agreement.

## 2014-04-05 NOTE — Telephone Encounter (Signed)
Called [name] because she has not shown up for treatment or called since Wednesday.  There was no answer on her phone.

## 2014-04-08 ENCOUNTER — Ambulatory Visit: Payer: Medicaid Other

## 2014-04-08 ENCOUNTER — Telehealth: Payer: Self-pay | Admitting: Oncology

## 2014-04-08 ENCOUNTER — Encounter: Payer: Self-pay | Admitting: *Deleted

## 2014-04-08 NOTE — Telephone Encounter (Signed)
[name] called back and said she was hurting too much to come in today.  She said she is too weak to drive.  She is going to try to come in tomorrow.

## 2014-04-08 NOTE — Progress Notes (Signed)
[place] [facility]  Clinical Social Work received message from radiation oncology RN that patient stated she feels like committing suicide by taking all her pills because she is in so much pain.  RN reported she strongly emphasized patient coming into office to be evaluated in order to receive pain medication.  CSW attempted to contact patient and patient's mother- unable to reach them by phone.  CSW will follow up with patient at a later time and will evaluate for personal safety risk.  CSW and RN discussed sharing with patient how concerned they are for her emotional well-being and discuss ways to address pain.     Polo Riley, MSW, LCSW, OSW-C Clinical Social Worker [facility] [phone]

## 2014-04-08 NOTE — Telephone Encounter (Signed)
[name] called and said "I feel like committing suicide because I am in so much pain."  She is wondering if Dr. [name] can prescribe the medication she was getting from the pain clinic ([place]?) or give her another referral to the pain clinic.  Advised her that she needs to come in and see Dr. [name].  She said the pain is in her rectal area and her lower abdominal area.  She said she has taken 1000 mg of tylenol without relief.  She said she is out of oxycodone.  Advised her to come in to be seen.  [name] verbalized agreement.    Left message for [name], CSW and [name], CSW about [name] stating that she wants to commit suicide.

## 2014-04-08 NOTE — Telephone Encounter (Signed)
Called [name] back to see if she is OK.  She said she is planning to come in tomorrow to see Dr. [name] about her pain.  Also asked if she is serious about hurting herself and she said "not really."  She said that she just needs something to relieve the pain so she can continue getting radiation.

## 2014-04-09 ENCOUNTER — Encounter: Payer: Self-pay | Admitting: Radiation Oncology

## 2014-04-09 ENCOUNTER — Ambulatory Visit
Admission: RE | Admit: 2014-04-09 | Discharge: 2014-04-09 | Disposition: A | Discharge status: Home / Self Care | Payer: Medicaid Other | Source: Ambulatory Visit | Attending: Radiation Oncology | Admitting: Radiation Oncology

## 2014-04-09 ENCOUNTER — Ambulatory Visit: Admission: RE | Admit: 2014-04-09 | Payer: Medicaid Other | Source: Ambulatory Visit

## 2014-04-09 VITALS — BP 136/93 | HR 105 | Temp 97.7°F

## 2014-04-09 DIAGNOSIS — C541 Malignant neoplasm of endometrium: Secondary | ICD-10-CM

## 2014-04-09 MED ORDER — HYDROMORPHONE HCL 4 MG PO TABS: 4.0000 mg | ORAL_TABLET | ORAL | Status: DC | PRN

## 2014-04-09 NOTE — Progress Notes (Signed)
  Radiation Oncology         [phone] ________________________________  Name: [name] MRN: [id number]  Date: 04/09/2014  DOB: 1955-11-23  Weekly Radiation Therapy Management  DIAGNOSIS: Recurrent endometrial cancer  Current Dose: 14.4 Gy     Planned Dose:  45+ Gy  Narrative . . . . . . . . The patient presents for routine under treatment assessment.                                   The patient complains of severe pain in the pelvis area. She feels her areas of pain of migrated further out into the inguinal and right pelvic bone area. Patient is completely out of her prescription medication. The patient is in too much pain to consider lying on the treatment table today. Patient asked that I prescribed Suboxone. She has used this medication in the past with good results. She obtained this through pain clinic. I discussed with the patient that I am unable to prescribe this medication does require special training and is limited under the drug addiction treatment act.       The patient does wish to resume her treatments but is unable to consider this and let she has adequate pain medication to lie on the treatment table. I will make another referral to the planning clinic. In the meantime the patient will be placed on Dilaudid 4 mg every 4 hours as needed for pain.                                 Set-up films were reviewed.                                 The chart was checked. Physical Findings. . .  oral temperature is 97.7 F (36.5 C). Her blood pressure is 136/93 and her pulse is 105. Her oxygen saturation is 99%. . The lungs are clear. The heart has regular rhythm and rate. The abdomen is soft and nontender with normal bowel sounds. Impression . . . . . . . The patient is tolerating radiation. Plan . . . . . . . . . . . . Resume treatment tomorrow.  Patient was seen by social work today  ________________________________ Angelica Ran, PhD, MD

## 2014-04-09 NOTE — Progress Notes (Signed)
[name] reports she is in pain at an 11/10 in her pelvic area radiating around to her back.  She is taking tylenol 1000 mg every 6 hours without releif.  She is out of her other pain medications.  She is requesting a refill on suboxone that she received from the pain clinic.   She is also out of levothyroxine 125 mcg tablets.  She has been cutting a 150 mcg tablet in half.  Her family doctor is not able to fill this without seeing her in the office.  She reports having a lot of yellow vaginal discharge.  She reports urinating frequently.

## 2014-04-10 ENCOUNTER — Ambulatory Visit: Payer: Medicaid Other

## 2014-04-10 ENCOUNTER — Telehealth: Payer: Self-pay | Admitting: Oncology

## 2014-04-10 NOTE — Telephone Encounter (Signed)
[name] called and said that she took one dilaudid tablet last night and it took the edge off her pain but did not relieve it.  She said that she noticed that her urine color was olive green this morning and that she is having "gooey" discharge.  She reports that she was called by the ER last week and advised to take Macrobid for her urinary tract infection.  [name] said she was reading about the side effects of Macrobid in her drug book (which said might lead to death) and was scared to take it.  Advised her that this is very rare.  She is not coming for radiation today because she wants to go to the ER to have her urinary tract infection checked.  She believes the UTI may be contributing to her pain.  Advised her that she can come and see one of the Radiation Oncologists.  She said she is not going to be able to arrive until latter in the afternoon and wants to go to the ER.  Advised her to let us know how she is doing.

## 2014-04-11 ENCOUNTER — Telehealth: Payer: Self-pay | Admitting: *Deleted

## 2014-04-11 ENCOUNTER — Ambulatory Visit
Admission: RE | Admit: 2014-04-11 | Discharge: 2014-04-11 | Disposition: A | Discharge status: Home / Self Care | Payer: Medicaid Other | Source: Ambulatory Visit | Attending: Radiation Oncology | Admitting: Radiation Oncology

## 2014-04-11 DIAGNOSIS — Z510 Encounter for antineoplastic radiation therapy: Secondary | ICD-10-CM | POA: Diagnosis not present

## 2014-04-11 NOTE — Telephone Encounter (Signed)
Called patient to inform of appt for pain clinic on 04-30-14- arrival time - 9:30 am, spoke with patient and she is aware of this appt.

## 2014-04-11 NOTE — Telephone Encounter (Signed)
CALLED PATIENT TO INFORM OF PAIN CLINIC MANAGEMENT ON 04/30/14 ARRIVAL TIME- 3 PM, SPOKE WITH PATIENT AND SHE IS AWARE OF THIS APPT., APPT. RESCHEDULED FROM 04/30/14 @ 9:30 AM

## 2014-04-12 ENCOUNTER — Ambulatory Visit: Admission: RE | Admit: 2014-04-12 | Payer: Medicaid Other | Source: Ambulatory Visit

## 2014-04-15 ENCOUNTER — Ambulatory Visit: Payer: Medicaid Other

## 2014-04-16 ENCOUNTER — Telehealth: Payer: Self-pay | Admitting: Oncology

## 2014-04-16 ENCOUNTER — Ambulatory Visit: Payer: Medicaid Other | Admitting: Radiation Oncology

## 2014-04-16 ENCOUNTER — Ambulatory Visit: Payer: Medicaid Other
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
## 2014-04-17 ENCOUNTER — Ambulatory Visit: Payer: Medicaid Other

## 2014-04-18 ENCOUNTER — Ambulatory Visit: Payer: Medicaid Other

## 2014-04-19 ENCOUNTER — Ambulatory Visit: Payer: Medicaid Other

## 2014-04-22 ENCOUNTER — Ambulatory Visit: Payer: Medicaid Other

## 2014-04-22 ENCOUNTER — Telehealth: Payer: Self-pay | Admitting: Oncology
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
## 2014-04-23 ENCOUNTER — Telehealth: Payer: Self-pay | Admitting: Oncology

## 2014-04-23 ENCOUNTER — Ambulatory Visit: Payer: Medicaid Other

## 2014-04-23 ENCOUNTER — Ambulatory Visit: Payer: Medicaid Other | Admitting: Radiation Oncology

## 2014-04-23 NOTE — Telephone Encounter (Signed)
Montoya called said her pain is worse today.  She is wondering if the dilaudid "comes any stronger."  She does have an appointment with the pain clinic on Tuesday.  She is also requesting a refill on her synthroid.  She also would like to restart radiation on March 1.  Advised her that Dr. [name] will be contacted and she will be called back.

## 2014-04-24 ENCOUNTER — Ambulatory Visit: Payer: Medicaid Other

## 2014-04-24 ENCOUNTER — Telehealth: Payer: Self-pay | Admitting: Oncology

## 2014-04-24 NOTE — Telephone Encounter (Signed)
[name] called, tearful, saying that she was in excruciating pain in her pelvic area.  She last took dilaudid early this morning.  Advised her to take one dilaudid tablet now.  She is also wondering if Dr. [name] would be able to send in an antibiotic because she thinks she has a UTI again. Advised her that she would need to be seen and have a urine culture before an antibiotic was sent in.  Also let her know that she will need to see her primary doctor for her TSH.

## 2014-04-25 ENCOUNTER — Ambulatory Visit: Payer: Medicaid Other

## 2014-04-26 ENCOUNTER — Ambulatory Visit: Payer: Medicaid Other

## 2014-04-29 ENCOUNTER — Ambulatory Visit: Payer: Medicaid Other

## 2014-04-29 ENCOUNTER — Telehealth: Payer: Self-pay | Admitting: Oncology

## 2014-04-29 NOTE — Telephone Encounter (Signed)
[name] with no answer.

## 2014-04-30 ENCOUNTER — Telehealth: Payer: Self-pay | Admitting: Oncology

## 2014-04-30 ENCOUNTER — Ambulatory Visit: Payer: Medicaid Other

## 2014-04-30 ENCOUNTER — Other Ambulatory Visit: Payer: Self-pay | Admitting: Radiation Oncology

## 2014-04-30 MED ORDER — HYDROMORPHONE HCL 4 MG PO TABS: 4.0000 mg | ORAL_TABLET | ORAL | Status: DC | PRN

## 2014-04-30 NOTE — Telephone Encounter (Signed)
[name] called and said she canceled her pain clinic appointment today due to not sleeping last night and feeling sick due to her pain.  She reports that she is taking dilaudid q 4 hours and has 7 tablets left.  She is requesting a refill right away and will have her friend Ronalee Belts pick it up.  Advised her that she would receive a call when the prescription is ready.  Also asked when she would like to start radiation again and she said she is not sure due to the weather and will call us when she is ready to start.

## 2014-05-01 ENCOUNTER — Ambulatory Visit: Payer: Medicaid Other

## 2014-05-02 ENCOUNTER — Ambulatory Visit: Payer: Medicaid Other

## 2014-05-03 ENCOUNTER — Ambulatory Visit: Payer: Medicaid Other

## 2014-05-06 ENCOUNTER — Ambulatory Visit: Payer: Medicaid Other

## 2014-05-07 ENCOUNTER — Ambulatory Visit: Payer: Medicaid Other

## 2014-05-08 ENCOUNTER — Ambulatory Visit: Payer: Medicaid Other

## 2014-05-09 ENCOUNTER — Ambulatory Visit: Payer: Medicaid Other

## 2014-05-10 ENCOUNTER — Ambulatory Visit: Payer: Medicaid Other

## 2014-05-13 ENCOUNTER — Ambulatory Visit: Payer: Medicaid Other

## 2014-05-14 ENCOUNTER — Ambulatory Visit: Payer: Medicaid Other

## 2014-05-15 ENCOUNTER — Telehealth: Payer: Self-pay | Admitting: Oncology

## 2014-05-15 NOTE — Telephone Encounter (Signed)
Berenice Primas, Dulcinea's mother, called and said Adylee is having pain "everywhere,' is not sleeping, has a poor appetite and is crying all the time.  She said that she "does not know what to do with her anymore."  She then put Margene on the phone.  Per Caren Griffins, she is having pain in her lower back, front, hips and legs.  She is taking dilaudid 1-1.5 tablets q 6 hours.  She said that she "doesn't want to live anymore" due to the pain.  She is wondering if she can have surgery to have the cancer taken out.  Advised her that she can reschedule her appointment with the pain clinic, come to see Dr. [name] or go to the ER.  She said she does not have transportation now and had tried to contact [employer] with [name].  Ellyanna said she was going to the ER after she ate her breakfast.  Notified Lauren, [place] about Xaria's condition and [name] issue.

## 2014-05-20 ENCOUNTER — Telehealth: Payer: Self-pay | Admitting: Oncology
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
## 2014-05-22 ENCOUNTER — Inpatient Hospital Stay (HOSPITAL_COMMUNITY)
Admission: EM | Admit: 2014-05-22 | Discharge: 2014-06-06 | DRG: 872 | Disposition: A | Discharge status: Hospice | Payer: Medicaid Other | Attending: Internal Medicine | Admitting: Internal Medicine

## 2014-05-22 ENCOUNTER — Encounter (HOSPITAL_COMMUNITY): Payer: Self-pay | Admitting: Emergency Medicine

## 2014-05-22 ENCOUNTER — Encounter: Payer: Self-pay | Admitting: Radiation Oncology

## 2014-05-22 ENCOUNTER — Emergency Department (HOSPITAL_COMMUNITY): Payer: Medicaid Other

## 2014-05-22 ENCOUNTER — Ambulatory Visit
Admission: RE | Admit: 2014-05-22 | Discharge: 2014-05-22 | Disposition: A | Discharge status: Home / Self Care | Payer: Medicaid Other | Source: Ambulatory Visit | Attending: Radiation Oncology | Admitting: Radiation Oncology

## 2014-05-22 VITALS — BP 107/61 | HR 104 | Temp 99.1°F | Resp 16 | Ht <= 58 in | Wt 166.4 lb

## 2014-05-22 DIAGNOSIS — Z7189 Other specified counseling: Secondary | ICD-10-CM | POA: Diagnosis present

## 2014-05-22 DIAGNOSIS — F29 Unspecified psychosis not due to a substance or known physiological condition: Secondary | ICD-10-CM | POA: Diagnosis present

## 2014-05-22 DIAGNOSIS — Z809 Family history of malignant neoplasm, unspecified: Secondary | ICD-10-CM

## 2014-05-22 DIAGNOSIS — C541 Malignant neoplasm of endometrium: Secondary | ICD-10-CM | POA: Diagnosis present

## 2014-05-22 DIAGNOSIS — E669 Obesity, unspecified: Secondary | ICD-10-CM | POA: Diagnosis present

## 2014-05-22 DIAGNOSIS — C7911 Secondary malignant neoplasm of bladder: Secondary | ICD-10-CM | POA: Diagnosis present

## 2014-05-22 DIAGNOSIS — F4323 Adjustment disorder with mixed anxiety and depressed mood: Secondary | ICD-10-CM | POA: Diagnosis present

## 2014-05-22 DIAGNOSIS — N390 Urinary tract infection, site not specified: Secondary | ICD-10-CM | POA: Diagnosis present

## 2014-05-22 DIAGNOSIS — E876 Hypokalemia: Secondary | ICD-10-CM | POA: Diagnosis not present

## 2014-05-22 DIAGNOSIS — I10 Essential (primary) hypertension: Secondary | ICD-10-CM | POA: Diagnosis present

## 2014-05-22 DIAGNOSIS — Z90710 Acquired absence of both cervix and uterus: Secondary | ICD-10-CM

## 2014-05-22 DIAGNOSIS — C7982 Secondary malignant neoplasm of genital organs: Secondary | ICD-10-CM | POA: Diagnosis present

## 2014-05-22 DIAGNOSIS — M797 Fibromyalgia: Secondary | ICD-10-CM | POA: Diagnosis present

## 2014-05-22 DIAGNOSIS — C775 Secondary and unspecified malignant neoplasm of intrapelvic lymph nodes: Secondary | ICD-10-CM | POA: Diagnosis present

## 2014-05-22 DIAGNOSIS — J45909 Unspecified asthma, uncomplicated: Secondary | ICD-10-CM | POA: Diagnosis present

## 2014-05-22 DIAGNOSIS — Z79899 Other long term (current) drug therapy: Secondary | ICD-10-CM

## 2014-05-22 DIAGNOSIS — Z8249 Family history of ischemic heart disease and other diseases of the circulatory system: Secondary | ICD-10-CM

## 2014-05-22 DIAGNOSIS — Z9114 Patient's other noncompliance with medication regimen: Secondary | ICD-10-CM | POA: Diagnosis present

## 2014-05-22 DIAGNOSIS — E039 Hypothyroidism, unspecified: Secondary | ICD-10-CM | POA: Diagnosis present

## 2014-05-22 DIAGNOSIS — A419 Sepsis, unspecified organism: Principal | ICD-10-CM | POA: Diagnosis present

## 2014-05-22 DIAGNOSIS — Z90722 Acquired absence of ovaries, bilateral: Secondary | ICD-10-CM | POA: Diagnosis present

## 2014-05-22 DIAGNOSIS — M4306 Spondylolysis, lumbar region: Secondary | ICD-10-CM | POA: Diagnosis present

## 2014-05-22 DIAGNOSIS — G4730 Sleep apnea, unspecified: Secondary | ICD-10-CM | POA: Diagnosis present

## 2014-05-22 DIAGNOSIS — Z515 Encounter for palliative care: Secondary | ICD-10-CM

## 2014-05-22 DIAGNOSIS — N1330 Unspecified hydronephrosis: Secondary | ICD-10-CM | POA: Diagnosis present

## 2014-05-22 DIAGNOSIS — Z882 Allergy status to sulfonamides status: Secondary | ICD-10-CM

## 2014-05-22 DIAGNOSIS — Z6834 Body mass index (BMI) 34.0-34.9, adult: Secondary | ICD-10-CM

## 2014-05-22 DIAGNOSIS — F411 Generalized anxiety disorder: Secondary | ICD-10-CM | POA: Diagnosis present

## 2014-05-22 DIAGNOSIS — I499 Cardiac arrhythmia, unspecified: Secondary | ICD-10-CM | POA: Diagnosis present

## 2014-05-22 DIAGNOSIS — R1900 Intra-abdominal and pelvic swelling, mass and lump, unspecified site: Secondary | ICD-10-CM

## 2014-05-22 DIAGNOSIS — C785 Secondary malignant neoplasm of large intestine and rectum: Secondary | ICD-10-CM | POA: Diagnosis present

## 2014-05-22 DIAGNOSIS — Z66 Do not resuscitate: Secondary | ICD-10-CM | POA: Diagnosis present

## 2014-05-22 DIAGNOSIS — G893 Neoplasm related pain (acute) (chronic): Secondary | ICD-10-CM

## 2014-05-22 LAB — COMPREHENSIVE METABOLIC PANEL
ALK PHOS: 61 U/L (ref 39–117)
ALT: 15 U/L (ref 0–35)
AST: 19 U/L (ref 0–37)
Albumin: 3.3 g/dL — ABNORMAL LOW (ref 3.5–5.2)
Anion gap: 9 (ref 5–15)
BUN: 11 mg/dL (ref 6–23)
CO2: 24 mmol/L (ref 19–32)
Calcium: 9 mg/dL (ref 8.4–10.5)
Chloride: 103 mmol/L (ref 96–112)
Creatinine, Ser: 1.03 mg/dL (ref 0.50–1.10)
GFR calc non Af Amer: 58 mL/min — ABNORMAL LOW (ref >90)
GFR, EST AFRICAN AMERICAN: 68 mL/min — AB (ref >90)
GLUCOSE: 107 mg/dL — AB (ref 70–99)
Potassium: 4 mmol/L (ref 3.5–5.1)
SODIUM: 136 mmol/L (ref 135–145)
TOTAL PROTEIN: 7.4 g/dL (ref 6.0–8.3)
Total Bilirubin: 0.5 mg/dL (ref 0.3–1.2)

## 2014-05-22 LAB — CBC WITH DIFFERENTIAL/PLATELET
BASOS ABS: 0 10*3/uL (ref 0.0–0.1)
BASOS PCT: 0 % (ref 0–1)
Eosinophils Absolute: 0.5 10*3/uL (ref 0.0–0.7)
Eosinophils Relative: 4 % (ref 0–5)
HEMATOCRIT: 32.5 % — AB (ref 36.0–46.0)
HEMOGLOBIN: 10.5 g/dL — AB (ref 12.0–15.0)
Lymphocytes Relative: 10 % — ABNORMAL LOW (ref 12–46)
Lymphs Abs: 1.4 10*3/uL (ref 0.7–4.0)
MCH: 27.9 pg (ref 26.0–34.0)
MCHC: 32.3 g/dL (ref 30.0–36.0)
MCV: 86.4 fL (ref 78.0–100.0)
MONOS PCT: 9 % (ref 3–12)
Monocytes Absolute: 1.2 10*3/uL — ABNORMAL HIGH (ref 0.1–1.0)
NEUTROS ABS: 10.3 10*3/uL — AB (ref 1.7–7.7)
NEUTROS PCT: 77 % (ref 43–77)
Platelets: 220 10*3/uL (ref 150–400)
RBC: 3.76 MIL/uL — ABNORMAL LOW (ref 3.87–5.11)
RDW: 12.9 % (ref 11.5–15.5)
WBC: 13.4 10*3/uL — ABNORMAL HIGH (ref 4.0–10.5)

## 2014-05-22 LAB — URINE MICROSCOPIC-ADD ON

## 2014-05-22 LAB — URINALYSIS, ROUTINE W REFLEX MICROSCOPIC
GLUCOSE, UA: NEGATIVE mg/dL
Ketones, ur: NEGATIVE mg/dL
Nitrite: NEGATIVE
Protein, ur: 100 mg/dL — AB
SPECIFIC GRAVITY, URINE: 1.02 (ref 1.005–1.030)
Urobilinogen, UA: 1 mg/dL (ref 0.0–1.0)
pH: 5.5 (ref 5.0–8.0)

## 2014-05-22 LAB — LIPASE, BLOOD: Lipase: 13 U/L (ref 11–59)

## 2014-05-22 LAB — I-STAT CG4 LACTIC ACID, ED: Lactic Acid, Venous: 0.8 mmol/L (ref 0.5–2.0)

## 2014-05-22 MED ORDER — HYDROMORPHONE HCL 1 MG/ML IJ SOLN
1.0000 mg | Freq: Once | INTRAMUSCULAR | Status: AC
  Administered 2014-05-22: 1 mg via INTRAVENOUS
  Filled 2014-05-22: qty 1

## 2014-05-22 MED ORDER — IOHEXOL 300 MG/ML  SOLN
50.0000 mL | Freq: Once | INTRAMUSCULAR | Status: AC | PRN
  Administered 2014-05-22: 50 mL via ORAL

## 2014-05-22 MED ORDER — SODIUM CHLORIDE 0.9 % IV BOLUS (SEPSIS)
1000.0000 mL | Freq: Once | INTRAVENOUS | Status: AC
  Administered 2014-05-22: 1000 mL via INTRAVENOUS

## 2014-05-22 MED ORDER — DEXTROSE 5 % IV SOLN
1.0000 g | Freq: Once | INTRAVENOUS | Status: AC
  Administered 2014-05-22: 1 g via INTRAVENOUS
  Filled 2014-05-22: qty 10

## 2014-05-22 MED ORDER — HYDROMORPHONE HCL 4 MG PO TABS: 4.0000 mg | ORAL_TABLET | ORAL | Status: DC | PRN

## 2014-05-22 MED ORDER — ONDANSETRON HCL 4 MG/2ML IJ SOLN
4.0000 mg | Freq: Once | INTRAMUSCULAR | Status: AC
  Administered 2014-05-22: 4 mg via INTRAVENOUS
  Filled 2014-05-22: qty 2

## 2014-05-22 MED ORDER — IOHEXOL 300 MG/ML  SOLN
100.0000 mL | Freq: Once | INTRAMUSCULAR | Status: AC | PRN
  Administered 2014-05-22: 100 mL via INTRAVENOUS

## 2014-05-22 NOTE — ED Notes (Signed)
Patient states she was seen at cancer center earlier today to discuss her treatment plan with the cancer team. Patient states she was sent here to rule out "cancer being worser" because "I wasn't going to be treatment appointments cause I was too sick." Patient reports nausea and vomiting, then denies vomiting states she has not had an appetite. Patient states she is constipated but having watery mucous-like stools. Patient states she is having pus draining from her "front and back" could not differentiate if she meant urethra or vagina. Patient ambulated to restroom unassisted. Patient mother at bedside. Patient and mother given warm blankets per their request. PA at bedside at this time.

## 2014-05-22 NOTE — ED Provider Notes (Signed)
CSN: [id number]     Arrival date & time 05/22/14  1726 History   First MD Initiated Contact with Patient 05/22/14 1949     Chief Complaint  Patient presents with  . Abdominal Pain  . Nausea  . Emesis  . Constipation     (Consider location/radiation/quality/duration/timing/severity/associated sxs/prior Treatment) HPI  Pt is a 59yo female with hx of thyroid disease, arthritis, asthma, depression, fibromyalgia, endometrial cancer, spinal stenosis, HTN, dysrhythmia, anxiety and chronic back pain sent to ED from cancer center with concern for recurrence of diverticulitis.  Pt states she was sent here to rule out "cancer being worse" because she "wasn't going to treatment appointments cause I was too sick."  She reports nausea and vomiting.  Pt also reports intermittent constipation and diarrhea. States when she has a BM it is mucous at times, reports "draining pus from front and back"  Pt is unsure if from urethra or vagina.  Per medical records from today from oncology, Dr. Gery Pray, pt has only completed about a week and half of therapy out of a planned 6 week course of therapy.  Pt reports drainage from the rectum which is concerning she has developed diverticulitis again.  Pt denies vaginal bleeding, rectal bleeding or hematuria.  Oncology recommends blood work and CT, likely will need admission. Dr. [name] also recommended pt consider hospice or palliate care evaluation so better support could be given at home.  Pt states she does not currently have a home health nurse but also does not recall discussion of hospice today.   Pt has also expressed concern about her pets recently being removed from her home by her landlord.  Pt states "due to a situation at home"  Pt is very upset and concerned about her pets. Pt states she "cannot go on living like this"  But denies plans of SI.  Denies HI.  Past Medical History  Diagnosis Date  . Thyroid disease   . Arthritis   . Asthma   .  Depression   . Fibromyalgia   . Lumbar spondylolysis   . Allergy   . Endometrial cancer   . Spinal stenosis   . Hypertension   . Dysrhythmia     OCCAS PAC'S AND PVC'S  PER HOLTER MONITOR STUDY --PT HAS OCCAS CHEST PAINS AND PALPITATIONS--HAD ECHO NORMAL LV SIZE AND FUNCTION.  Marland Kitchen Sleep apnea     HAS CPAP MACHINE BUT DOES NOT USE  . Anxiety     HAS OCD AND ON DISABILITY-PER [name]   . Back pain, chronic   . Obese    Past Surgical History  Procedure Laterality Date  . Cesarean section    . Abdominal hysterectomy      total   Family History  Problem Relation Age of Onset  . Hypertension Mother   . Coronary artery disease Father   . Cancer Brother     Renal cell carcinoma  . Hypertension Other   . Cancer Other   . Heart attack Father   . Coronary artery disease Brother    History  Substance Use Topics  . Smoking status: Never Smoker   . Smokeless tobacco: Never Used  . Alcohol Use: No   OB History    Gravida Para Term Preterm AB TAB SAB Ectopic Multiple Living   1 1             Review of Systems  Constitutional: Positive for chills, appetite change and fatigue. Negative for fever.  Respiratory: Negative  for shortness of breath.   Cardiovascular: Negative for chest pain.  Gastrointestinal: Positive for nausea, vomiting, abdominal pain and diarrhea. Negative for constipation.  Neurological: Positive for weakness.  All other systems reviewed and are negative.     Allergies  Sulfa antibiotics and Sulfamethoxazole-trimethoprim  Home Medications   Prior to Admission medications   Medication Sig Start Date End Date Taking? Authorizing Provider  acetaminophen (TYLENOL) 500 MG tablet Take 1,000 mg by mouth every 6 (six) hours as needed.   Yes Historical Provider, MD  b complex vitamins capsule Take 1 capsule by mouth daily.   Yes Historical Provider, MD  ibuprofen (ADVIL,MOTRIN) 200 MG tablet Take 400 mg by mouth every 6 (six) hours as needed for moderate pain.    Yes Historical Provider, MD  levothyroxine (SYNTHROID, LEVOTHROID) 125 MCG tablet Take 1 tablet (125 mcg total) by mouth daily before breakfast. 01/15/14  Yes Robbie Lis, MD  LORazepam (ATIVAN) 1 MG tablet Take 1 tablet (1 mg total) by mouth 2 (two) times daily. 02/23/12  Yes Shari A Upstill, PA-C  Methylsulfonylmethane (MSM) 1000 MG TABS Take 1 tablet by mouth daily.   Yes Historical Provider, MD  sertraline (ZOLOFT) 100 MG tablet Take 200 mg by mouth every evening.  10/15/11  Yes Srikar Janna Arch, MD  thiothixene (NAVANE) 2 MG capsule Take 1 capsule (2 mg total) by mouth at bedtime. 03/07/12  Yes Carlisle Cater, PA-C  Vitamin D, Ergocalciferol, (DRISDOL) 50000 UNITS CAPS capsule Take 50,000 Units by mouth every 7 (seven) days. No specific days   Yes Historical Provider, MD  albuterol (VENTOLIN HFA) 108 (90 BASE) MCG/ACT inhaler Inhale 2 puffs into the lungs every 4 (four) hours as needed for wheezing or shortness of breath. Patient not taking: Reported on 03/25/2014 01/15/14   Robbie Lis, MD  amoxicillin-clavulanate (AUGMENTIN) 875-125 MG per tablet Take 1 tablet by mouth 2 (two) times daily. One po bid x 7 days Patient not taking: Reported on 03/25/2014 03/07/14   Charlesetta Shanks, MD  cephALEXin (KEFLEX) 500 MG capsule Take 1 capsule (500 mg total) by mouth 4 (four) times daily. Patient not taking: Reported on 04/09/2014 03/26/14   Nehemiah Settle A Upstill, PA-C  HYDROmorphone (DILAUDID) 4 MG tablet Take 1 tablet (4 mg total) by mouth every 4 (four) hours as needed for severe pain. 05/22/14   Blair Promise, MD  Multiple Vitamins-Minerals (MULTIVITAMIN) tablet Take 1 tablet by mouth daily. Patient not taking: Reported on 03/04/2014 01/24/14   Kalman Drape, MD  nitrofurantoin, macrocrystal-monohydrate, (MACROBID) 100 MG capsule Take 1 capsule (100 mg total) by mouth 2 (two) times daily. Patient not taking: Reported on 03/04/2014 01/24/14   Kalman Drape, MD  ondansetron (ZOFRAN) 4 MG tablet Take 1 tablet (4  mg total) by mouth every 6 (six) hours as needed for nausea. Patient not taking: Reported on 03/04/2014 02/06/14   Blair Promise, MD  Oxycodone HCl 10 MG TABS Take 1 tablet (10 mg total) by mouth every 6 (six) hours as needed. Patient not taking: Reported on 03/04/2014 02/06/14   Blair Promise, MD   BP 103/62 mmHg  Pulse 72  Temp(Src) 98.3 F (36.8 C) (Oral)  Resp 16  Ht 4\' 10"  (1.473 m)  SpO2 97% Physical Exam  Constitutional: She appears well-developed and well-nourished. She appears distressed.  Pt initially calm in NAD in exam room, then tearful and whaling in exam room after Hx obtained.    HENT:  Head: Normocephalic and atraumatic.  Eyes: Conjunctivae are normal. No scleral icterus.  Neck: Normal range of motion.  Cardiovascular: Normal rate, regular rhythm and normal heart sounds.   Pulmonary/Chest: Effort normal and breath sounds normal. No respiratory distress. She has no wheezes. She has no rales. She exhibits no tenderness.  Abdominal: Soft. Bowel sounds are normal. She exhibits no distension and no mass. There is tenderness. There is no rebound and no guarding.  Genitourinary:  Chaperoned exam: normal external genitalia. Malodorous purulent yellow discharge.  No vaginal bleeding. Too tender for speculum exam.   Musculoskeletal: Normal range of motion.  Neurological: She is alert.  Skin: Skin is warm and dry. She is not diaphoretic.  Psychiatric: Her mood appears anxious. Her affect is labile. She exhibits a depressed mood. She expresses no homicidal and no suicidal ideation. She expresses no suicidal plans and no homicidal plans.  tearful  Nursing note and vitals reviewed.   ED Course  Procedures (including critical care time) Labs Review Labs Reviewed  COMPREHENSIVE METABOLIC PANEL - Abnormal; Notable for the following:    Glucose, Bld 107 (*)    Albumin 3.3 (*)    GFR calc non Af Amer 58 (*)    GFR calc Af Amer 68 (*)    All other components within normal limits   CBC WITH DIFFERENTIAL/PLATELET - Abnormal; Notable for the following:    WBC 13.4 (*)    RBC 3.76 (*)    Hemoglobin 10.5 (*)    HCT 32.5 (*)    Neutro Abs 10.3 (*)    Lymphocytes Relative 10 (*)    Monocytes Absolute 1.2 (*)    All other components within normal limits  URINALYSIS, ROUTINE W REFLEX MICROSCOPIC - Abnormal; Notable for the following:    Color, Urine AMBER (*)    APPearance TURBID (*)    Hgb urine dipstick SMALL (*)    Bilirubin Urine SMALL (*)    Protein, ur 100 (*)    Leukocytes, UA MODERATE (*)    All other components within normal limits  URINE MICROSCOPIC-ADD ON - Abnormal; Notable for the following:    Squamous Epithelial / LPF MANY (*)    Bacteria, UA MANY (*)    All other components within normal limits  TSH - Abnormal; Notable for the following:    TSH 22.742 (*)    All other components within normal limits  COMPREHENSIVE METABOLIC PANEL - Abnormal; Notable for the following:    Sodium 131 (*)    Potassium 3.4 (*)    Calcium 8.3 (*)    Albumin 2.9 (*)    GFR calc non Af Amer 61 (*)    GFR calc Af Amer 71 (*)    All other components within normal limits  CBC - Abnormal; Notable for the following:    WBC 12.6 (*)    RBC 3.43 (*)    Hemoglobin 9.7 (*)    HCT 29.4 (*)    All other components within normal limits  MRSA PCR SCREENING  LIPASE, BLOOD  MAGNESIUM  PHOSPHORUS  I-STAT CG4 LACTIC ACID, ED  I-STAT CG4 LACTIC ACID, ED    Imaging Review Ct Abdomen Pelvis W Contrast  05/22/2014   CLINICAL DATA:  Endometrial cancer, nausea, pelvic drainage  EXAM: CT ABDOMEN AND PELVIS WITH CONTRAST  TECHNIQUE: Multidetector CT imaging of the abdomen and pelvis was performed using the standard protocol following bolus administration of intravenous contrast.  CONTRAST:  161mL OMNIPAQUE IOHEXOL 300 MG/ML SOLN, 62mL OMNIPAQUE IOHEXOL 300 MG/ML SOLN  COMPARISON:  03/06/2014  FINDINGS: Lower chest:  Lung bases are clear.  Hepatobiliary: Liver is within normal limits.   Gallbladder is unremarkable. No intrahepatic or extrahepatic ductal dilatation.  Pancreas: Within normal limits.  Spleen: Splenomegaly, measuring 15.5 cm in maximal dimension.  Adrenals/Urinary Tract: Adrenal glands are within normal limits.  Left kidney is within normal limits.  Malrotated right kidney. Moderate hydroureteronephrosis, likely secondary to extrinsic compression.  Thick-walled bladder.  Stomach/Bowel: Stomach is within normal limits.  No evidence of bowel obstruction.  Appendix is not discretely visualized and may be surgically absent.  Moderate colonic stool burden, suggesting constipation.  Sigmoid colon is thick-walled and likely involved via the pelvic mass (described below).  Mass like thickening involving the rectum (series 2/image 57), new.  Vascular/Lymphatic: No evidence of abdominal aortic aneurysm.  Small subcentimeter retroperitoneal nodes, indeterminate.  Scattered nodes along the sigmoid mesocolon measuring up to 10 mm (series 2/image 52), along with bilateral internal iliac nodes measuring 19-20 mm (series 2/image 57), compatible with nodal metastases.  Reproductive: Status post hysterectomy.  7.4 x 5.7 x 6.1 cm necrotic pelvic mass (series 2/image 62), likely arising from the vaginal cuff, previously 3.2 x 5.6 cm. Gas within the lesion suggest direct communication with the sigmoid colon/ rectum. Additionally, the mass abuts the posterior wall of the bladder, which is also likely involved.  Other: No abdominopelvic ascites.  Musculoskeletal: Degenerative changes of the thoracolumbar spine, most prominent at L5-S1.  IMPRESSION: 7.4 cm necrotic pelvic mass arising from the vaginal cuff, increased.  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 Suspected involvement of the bladder.  Associated pelvic nodal metastases.  Moderate right hydroureteronephrosis, likely reflecting extrinsic compression by tumor.   Electronically Signed   By: Julian Hy M.D.    On: 05/22/2014 21:59     EKG Interpretation None      MDM   Final diagnoses:  Pelvic mass    Pt is a 59yo female with hx of recurrent endometrial Cancer and diverticulitis.  Pt is very emotional throughout exam.  Has been able to ambulate to restroom w/o difficulty but will burst into tears, talking about her cancer as well as situation at home where her pets were recently removed from her home. Pt does have diffuse tenderness to her abdomen. Pelvic exam significant for malodorous yellow discharge.   Labs: leukocytosis present, up from 5.8 one month ago to 13.4 today.  UA also significant for UTI, moderate leukocytes with TNTC WBC.  CT abd: significant for a 7.4cm necrotic pelvic mass arising from the vaginal cuff, increased.  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  Suspected involvement of the bladder.  Associated pelvic nodal metastases.  Moderate right hydroureteronephrosis, likely reflecting extrinsic compression by tumor.    10:47 PM Consulted with Dr. Roel Cluck who will review pt's medical records and examine pt to help determine disposition.   Pt will be admitted for further workup and evaluation.    Noland Fordyce, PA-C 05/23/14 1511  Blanchie Dessert, MD 05/24/14 [phone]

## 2014-05-22 NOTE — Progress Notes (Signed)
Called report to Rugby, Therapist, sports, Camera operator in the ER.  Raigen was taken to the ER in a wheelchair by Vivien Rota, Nurse Transporter.

## 2014-05-22 NOTE — Progress Notes (Addendum)
[name] is here today with her mother for follow up.  She reports that her pain has increased in her left pelvis, lower abdomen and lower back.  She is taking dilaudid 4 mg 1 - 1 1/2 tablet every 4 hours.  She reports feeling a stabbing pain in her vaginal and rectal area.  She reports urinating frequently with pain with urination occasionally.  She reports seeing "clear pus" from her rectal area today.  She is concerned that she has diverticulitis again.  She reports her bowel movements are small and are composed of mucous.  She reports constipation.  Patient reports a poor appetite.  She has lost 1 lb since 11/15.  She reports fatigue and has not been active at home.   BP 107/61 mmHg  Pulse 104  Temp(Src) 99.1 F (37.3 C) (Oral)  Resp 16  Ht 4\' 10"  (1.473 m)  Wt 166 lb 6.4 oz (75.479 kg)  BMI 34.79 kg/m2  SpO2 99%

## 2014-05-22 NOTE — Progress Notes (Signed)
Radiation Oncology         [phone]) [phone] ________________________________  Name: [name] MRN: [id number]  Date: 05/22/2014  DOB: [birth year]  Follow-Up Visit Note  CC: [name], FNP  Sherol Dade., MD    ICD-9-CM ICD-10-CM   1. Endometrial ca 182.0 C54.1     Diagnosis:   Recurrent endometrial cancer  Narrative:  The patient presents today as a work in late this afternoon. She never completed her radiation therapy directed at the pelvis area for management of her recurrence. She only completed approximately a week and a half of therapy out of a planned 6 week course of therapy. Patient is complaining of more pain in the pelvis and the abdominal area. She's also had drainage from the rectum is concerned she may have developed diverticulitis again. Patient complains of excessive thirst and a poor appetite. She denies any vaginal bleeding at this time or hematuria. She denies any rectal bleeding.  The patient's family pets were removed from her house recently with details concerning this issue pending at this time. Patient has presented too late to the clinic for blood work or to schedule a CT scan. I recommended she present to the emergency room for further evaluation. She may require admission and antibiotic therapy. She is inquiring about the status of her cancer and I've informed her that I do not know without repeating her scans. I've also recommended she consider hospice or palliative care evaluation so that better support can be given at home.  The patient's Dilaudid was refilled today.                            ALLERGIES:  is allergic to sulfa antibiotics and sulfamethoxazole-trimethoprim.  Meds: Current Outpatient Prescriptions  Medication Sig Dispense Refill  . b complex vitamins capsule Take 1 capsule by mouth daily.    Marland Kitchen HYDROmorphone (DILAUDID) 4 MG tablet Take 1 tablet (4 mg total) by mouth every 4 (four) hours as needed for severe pain. 100 tablet 0  .  ibuprofen (ADVIL,MOTRIN) 200 MG tablet Take 400 mg by mouth every 6 (six) hours as needed for moderate pain.    Marland Kitchen levothyroxine (SYNTHROID, LEVOTHROID) 125 MCG tablet Take 1 tablet (125 mcg total) by mouth daily before breakfast. 30 tablet 0  . LORazepam (ATIVAN) 1 MG tablet Take 1 tablet (1 mg total) by mouth 2 (two) times daily. 30 tablet 0  . Methylsulfonylmethane (MSM) 1000 MG TABS Take 1 tablet by mouth daily.    . sertraline (ZOLOFT) 100 MG tablet Take 200 mg by mouth every evening.     . thiothixene (NAVANE) 2 MG capsule Take 1 capsule (2 mg total) by mouth at bedtime. 10 capsule 0  . acetaminophen (TYLENOL) 500 MG tablet Take 1,000 mg by mouth every 6 (six) hours as needed.    Marland Kitchen albuterol (VENTOLIN HFA) 108 (90 BASE) MCG/ACT inhaler Inhale 2 puffs into the lungs every 4 (four) hours as needed for wheezing or shortness of breath. (Patient not taking: Reported on 03/25/2014) 1 Inhaler 0  . amoxicillin-clavulanate (AUGMENTIN) 875-125 MG per tablet Take 1 tablet by mouth 2 (two) times daily. One po bid x 7 days (Patient not taking: Reported on 03/25/2014) 14 tablet 0  . cephALEXin (KEFLEX) 500 MG capsule Take 1 capsule (500 mg total) by mouth 4 (four) times daily. (Patient not taking: Reported on 04/09/2014) 20 capsule 0  . Multiple Vitamins-Minerals (MULTIVITAMIN) tablet Take 1 tablet  by mouth daily. (Patient not taking: Reported on 03/04/2014) 30 tablet 0  . nitrofurantoin, macrocrystal-monohydrate, (MACROBID) 100 MG capsule Take 1 capsule (100 mg total) by mouth 2 (two) times daily. (Patient not taking: Reported on 03/04/2014) 10 capsule 0  . ondansetron (ZOFRAN) 4 MG tablet Take 1 tablet (4 mg total) by mouth every 6 (six) hours as needed for nausea. (Patient not taking: Reported on 03/04/2014) 20 tablet 2  . Oxycodone HCl 10 MG TABS Take 1 tablet (10 mg total) by mouth every 6 (six) hours as needed. (Patient not taking: Reported on 03/04/2014) 30 tablet 0  . Vitamin D, Ergocalciferol, (DRISDOL)  50000 UNITS CAPS capsule Take 50,000 Units by mouth every 7 (seven) days. No specific days     No current facility-administered medications for this encounter.    Physical Findings: The patient is in no acute distress. Patient is alert and oriented.  height is 4\' 10"  (1.473 m) and weight is 166 lb 6.4 oz (75.479 kg). Her oral temperature is 99.1 F (37.3 C). Her blood pressure is 107/61 and her pulse is 104. Her respiration is 16 and oxygen saturation is 99%. .  She is accompanied by her mother on evaluation today. The patient smells of urine. She is quite tearful throughout the evaluation and somewhat histrionic. The lungs are clear. The heart has a regular rhythm with a slightly increased rate. Bowel sounds are normal. No rebound or guarding is present.     Impression:  Recurrent endometrial cancer suboptimally treated.  Patient will be transported to the emergency room for further evaluation with blood work and likely CT scans ordered. The patient will be seen tomorrow if she is admitted to the hospital.     ____________________________________ Blair Promise, MD

## 2014-05-22 NOTE — ED Notes (Signed)
Pt, being sent by [facility], for pain management.  C/o lower abdominal and rectal pain, n/v/d, and constipation.  Hx of recurrent endometrial CA and diverticulitis.  Pt has not had CA treatment since January.

## 2014-05-22 NOTE — H&P (Addendum)
PCP:     [name], FNP    Chief Complaint:  Pelvic pain  HPI: [name] is a 59 y.o. female   has a past medical history of Thyroid disease; Arthritis; Asthma; Depression; Fibromyalgia; Lumbar spondylolysis; Allergy; Endometrial cancer; Spinal stenosis; Hypertension; Dysrhythmia; Sleep apnea; Anxiety; Back pain, chronic; and Obese.   Patient with complex medical hx. extensive history of noncompliance and drug seeking behavior and polysubstance abuse. 2013 patient was found to have early stage endometrial cancer. 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 Patient did not follow up with gyn oncology.  In 2015 she presented with vaginal bleeding, pelvic pain and discharge. 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  CT at that time showed new lobulated soft tissue mass involving vaginal cuff and posterior wall of the urinary bladder, with areas of internal necrosis, measuring 3.7 x 6.9 cm; there was also new pelvic adenopathy at iliac bifurcations bilaterally, but no obvious disease outside of pelvis, including nothing involving bowels, and no hydronephrosis. Patient had an admission for diverticulitis in October 2015 and ended time was able to be seen in-house by radiation oncology and oncology which point palliative radiation therapy has been started. Patient has been going to her treatments intermittently. She has completed only about 1.5 [name] has 6 treatments. Radiation oncologist started her on Dilaudid 4 mg as needed for pain. She is supposed to follow-up with pain clinic but canceled her appointment. Patient continued to have worsening abdominal pain but started to have stabbing pain in her vaginal and rectal area. She have had frequent urination in  seen pus from her rectal area. She has been intermittently tearful. Reports low-grade fevers. Today she was seen again by radiation oncology. Who has discussed potential hospice or palliative care evaluation. Patient was recommended to present to emergency department so that the father workup could be done. Patient currently resides with her 68 year old mother.   Scan today showed progression of her tumor burden 7 .4 cm necrotic pelvic mass arising from the vaginal cuff, Increased. 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 Suspected involvement of the bladder. Moderate right hydroureteronephrosis, likely reflecting extrinsic compression by tumor.  Unfortunately patient and the family have very poor insight. Patient has been verbally abusive to the staff and her family. At this point she is concentrated on pain management. Neither patient nor her family at this point is ready to have a conversation about her prognosis stating that "she'll be just fine". Unable to finish interview due to patient becoming agitated and screaming out.  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   UA showed evidence of UTI/ pateint with elevated WBC. Transiently tachycardic. NO nausea or vomiting. She refuses trial of PO pain medications and threatens to leave AMA.   Review of Systems:  Unable to obtain as patient being aggitated Past Medical History: Past Medical History  Diagnosis Date  . Thyroid disease   . Arthritis   . Asthma   . Depression   . Fibromyalgia   . Lumbar spondylolysis   . Allergy   . Endometrial cancer   . Spinal stenosis   . Hypertension   . Dysrhythmia     OCCAS PAC'S AND PVC'S  PER HOLTER MONITOR STUDY --PT HAS OCCAS CHEST PAINS AND PALPITATIONS--HAD ECHO  NORMAL LV SIZE AND FUNCTION.  Marland Kitchen Sleep apnea     HAS CPAP MACHINE BUT DOES NOT USE  . Anxiety     HAS OCD AND ON DISABILITY-PER  [name]   . Back pain, chronic   . Obese    Past Surgical History  Procedure Laterality Date  . Cesarean section    . Abdominal hysterectomy      total     Medications: Prior to Admission medications   Medication Sig Start Date End Date Taking? Authorizing Provider  acetaminophen (TYLENOL) 500 MG tablet Take 1,000 mg by mouth every 6 (six) hours as needed.   Yes Historical Provider, MD  b complex vitamins capsule Take 1 capsule by mouth daily.   Yes Historical Provider, MD  ibuprofen (ADVIL,MOTRIN) 200 MG tablet Take 400 mg by mouth every 6 (six) hours as needed for moderate pain.   Yes Historical Provider, MD  levothyroxine (SYNTHROID, LEVOTHROID) 125 MCG tablet Take 1 tablet (125 mcg total) by mouth daily before breakfast. 01/15/14  Yes Robbie Lis, MD  LORazepam (ATIVAN) 1 MG tablet Take 1 tablet (1 mg total) by mouth 2 (two) times daily. 02/23/12  Yes Shari A Upstill, PA-C  Methylsulfonylmethane (MSM) 1000 MG TABS Take 1 tablet by mouth daily.   Yes Historical Provider, MD  sertraline (ZOLOFT) 100 MG tablet Take 200 mg by mouth every evening.  10/15/11  Yes Srikar Janna Arch, MD  thiothixene (NAVANE) 2 MG capsule Take 1 capsule (2 mg total) by mouth at bedtime. 03/07/12  Yes Carlisle Cater, PA-C  Vitamin D, Ergocalciferol, (DRISDOL) 50000 UNITS CAPS capsule Take 50,000 Units by mouth every 7 (seven) days. No specific days   Yes Historical Provider, MD  albuterol (VENTOLIN HFA) 108 (90 BASE) MCG/ACT inhaler Inhale 2 puffs into the lungs every 4 (four) hours as needed for wheezing or shortness of breath. Patient not taking: Reported on 03/25/2014 01/15/14   Robbie Lis, MD  amoxicillin-clavulanate (AUGMENTIN) 875-125 MG per tablet Take 1 tablet by mouth 2 (two) times daily. One po bid x 7 days Patient not taking: Reported on 03/25/2014 03/07/14   Charlesetta Shanks, MD  cephALEXin (KEFLEX) 500 MG capsule Take 1 capsule (500 mg total) by mouth 4 (four) times daily. Patient not taking:  Reported on 04/09/2014 03/26/14   Nehemiah Settle A Upstill, PA-C  HYDROmorphone (DILAUDID) 4 MG tablet Take 1 tablet (4 mg total) by mouth every 4 (four) hours as needed for severe pain. 05/22/14   Blair Promise, MD  Multiple Vitamins-Minerals (MULTIVITAMIN) tablet Take 1 tablet by mouth daily. Patient not taking: Reported on 03/04/2014 01/24/14   Kalman Drape, MD  nitrofurantoin, macrocrystal-monohydrate, (MACROBID) 100 MG capsule Take 1 capsule (100 mg total) by mouth 2 (two) times daily. Patient not taking: Reported on 03/04/2014 01/24/14   Kalman Drape, MD  ondansetron (ZOFRAN) 4 MG tablet Take 1 tablet (4 mg total) by mouth every 6 (six) hours as needed for nausea. Patient not taking: Reported on 03/04/2014 02/06/14   Blair Promise, MD  Oxycodone HCl 10 MG TABS Take 1 tablet (10 mg total) by mouth every 6 (six) hours as needed. Patient not taking: Reported on 03/04/2014 02/06/14   Blair Promise, MD    Allergies:   Allergies  Allergen Reactions  . Sulfa Antibiotics Other (See Comments)    Unknown-pt states she is unsure of what her reaction is  . Sulfamethoxazole-Trimethoprim Other (See Comments)    Unknown reaction    Social  History:  Ambulatory  independently  Lives at home With family    reports that she has never smoked. She has never used smokeless tobacco. She reports that she does not drink alcohol or use illicit drugs.    Family History: family history includes Cancer in her brother and other; Coronary artery disease in her brother and father; Heart attack in her father; Hypertension in her mother and other.    Physical Exam: Patient Vitals for the past 24 hrs:  BP Temp Temp src Pulse Resp SpO2  05/22/14 2226 125/80 mmHg 98.7 F (37.1 C) Oral 97 15 100 %  05/22/14 1939 121/66 mmHg 99 F (37.2 C) Oral 79 18 100 %  05/22/14 1742 112/67 mmHg 98.3 F (36.8 C) Oral 87 16 99 %    1. General:  appears Acute distress 2. Psychological: Alert and  Oriented 3. Head/ENT:     Dry  Mucous Membranes                          Head Non traumatic, neck supple                          Normal   Dentition 4. SKIN:   decreased Skin turgor,  Skin clean Dry and intact no rash 5. Heart: Regular rate and rhythm no Murmur, Rub or gallop 6. Lungs: Clear to auscultation bilaterally, no wheezes or crackles   7. Abdomen: Soft, non-tender, Non distended 8. Lower extremities: no clubbing, cyanosis, or edema 9. Neurologically Grossly intact, moving all 4 extremities equally 10. MSK: Normal range of motion  body mass index is unknown because there is no weight on file.   Labs on Admission:   Results for orders placed or performed during the hospital encounter of 05/22/14 (from the past 24 hour(s))  Urinalysis, Routine w reflex microscopic     Status: Abnormal   Collection Time: 05/22/14  6:16 PM  Result Value Ref Range   Color, Urine AMBER (A) YELLOW   APPearance TURBID (A) CLEAR   Specific Gravity, Urine 1.020 1.005 - 1.030   pH 5.5 5.0 - 8.0   Glucose, UA NEGATIVE NEGATIVE mg/dL   Hgb urine dipstick SMALL (A) NEGATIVE   Bilirubin Urine SMALL (A) NEGATIVE   Ketones, ur NEGATIVE NEGATIVE mg/dL   Protein, ur 100 (A) NEGATIVE mg/dL   Urobilinogen, UA 1.0 0.0 - 1.0 mg/dL   Nitrite NEGATIVE NEGATIVE   Leukocytes, UA MODERATE (A) NEGATIVE  Urine microscopic-add on     Status: Abnormal   Collection Time: 05/22/14  6:16 PM  Result Value Ref Range   Squamous Epithelial / LPF MANY (A) RARE   WBC, UA TOO NUMEROUS TO COUNT <3 WBC/hpf   RBC / HPF 3-6 <3 RBC/hpf   Bacteria, UA MANY (A) RARE  Comprehensive metabolic panel     Status: Abnormal   Collection Time: 05/22/14  6:23 PM  Result Value Ref Range   Sodium 136 135 - 145 mmol/L   Potassium 4.0 3.5 - 5.1 mmol/L   Chloride 103 96 - 112 mmol/L   CO2 24 19 - 32 mmol/L   Glucose, Bld 107 (H) 70 - 99 mg/dL   BUN 11 6 - 23 mg/dL   Creatinine, Ser 1.03 0.50 - 1.10 mg/dL   Calcium 9.0 8.4 - 10.5 mg/dL   Total Protein 7.4 6.0 - 8.3  g/dL   Albumin 3.3 (L) 3.5 - 5.2 g/dL  AST 19 0 - 37 U/L   ALT 15 0 - 35 U/L   Alkaline Phosphatase 61 39 - 117 U/L   Total Bilirubin 0.5 0.3 - 1.2 mg/dL   GFR calc non Af Amer 58 (L) >90 mL/min   GFR calc Af Amer 68 (L) >90 mL/min   Anion gap 9 5 - 15  Lipase, blood     Status: None   Collection Time: 05/22/14  6:23 PM  Result Value Ref Range   Lipase 13 11 - 59 U/L  CBC with Differential     Status: Abnormal   Collection Time: 05/22/14  6:23 PM  Result Value Ref Range   WBC 13.4 (H) 4.0 - 10.5 K/uL   RBC 3.76 (L) 3.87 - 5.11 MIL/uL   Hemoglobin 10.5 (L) 12.0 - 15.0 g/dL   HCT 32.5 (L) 36.0 - 46.0 %   MCV 86.4 78.0 - 100.0 fL   MCH 27.9 26.0 - 34.0 pg   MCHC 32.3 30.0 - 36.0 g/dL   RDW 12.9 11.5 - 15.5 %   Platelets 220 150 - 400 K/uL   Neutrophils Relative % 77 43 - 77 %   Neutro Abs 10.3 (H) 1.7 - 7.7 K/uL   Lymphocytes Relative 10 (L) 12 - 46 %   Lymphs Abs 1.4 0.7 - 4.0 K/uL   Monocytes Relative 9 3 - 12 %   Monocytes Absolute 1.2 (H) 0.1 - 1.0 K/uL   Eosinophils Relative 4 0 - 5 %   Eosinophils Absolute 0.5 0.0 - 0.7 K/uL   Basophils Relative 0 0 - 1 %   Basophils Absolute 0.0 0.0 - 0.1 K/uL    UA evidence of UTI  No results found for: HGBA1C  Estimated Creatinine Clearance: 50.8 mL/min (by C-G formula based on Cr of 1.03).  BNP (last 3 results) No results for input(s): PROBNP in the last 8760 hours.  Other results:  I have pearsonaly reviewed this: ECG not obtained   There were no vitals filed for this visit.   Cultures:    Component Value Date/Time   SDES URINE, CLEAN CATCH 03/26/2014 0119   SPECREQUEST NONE 03/26/2014 0119   CULT  03/26/2014 0119    STAPHYLOCOCCUS SPECIES (COAGULASE NEGATIVE) Note: RIFAMPIN AND GENTAMICIN SHOULD NOT BE USED AS SINGLE DRUGS FOR TREATMENT OF STAPH INFECTIONS. Performed at [facility] 03/31/2014 FINAL 03/26/2014 0119     Radiological Exams on Admission: Ct Abdomen Pelvis W  Contrast  05/22/2014   CLINICAL DATA:  Endometrial cancer, nausea, pelvic drainage  EXAM: CT ABDOMEN AND PELVIS WITH CONTRAST  TECHNIQUE: Multidetector CT imaging of the abdomen and pelvis was performed using the standard protocol following bolus administration of intravenous contrast.  CONTRAST:  1100mL OMNIPAQUE IOHEXOL 300 MG/ML SOLN, 20mL OMNIPAQUE IOHEXOL 300 MG/ML SOLN  COMPARISON:  03/06/2014  FINDINGS: Lower chest:  Lung bases are clear.  Hepatobiliary: Liver is within normal limits.  Gallbladder is unremarkable. No intrahepatic or extrahepatic ductal dilatation.  Pancreas: Within normal limits.  Spleen: Splenomegaly, measuring 15.5 cm in maximal dimension.  Adrenals/Urinary Tract: Adrenal glands are within normal limits.  Left kidney is within normal limits.  Malrotated right kidney. Moderate hydroureteronephrosis, likely secondary to extrinsic compression.  Thick-walled bladder.  Stomach/Bowel: Stomach is within normal limits.  No evidence of bowel obstruction.  Appendix is not discretely visualized and may be surgically absent.  Moderate colonic stool burden, suggesting constipation.  Sigmoid colon is thick-walled and likely involved via the pelvic  mass (described below).  Mass like thickening involving the rectum (series 2/image 57), new.  Vascular/Lymphatic: No evidence of abdominal aortic aneurysm.  Small subcentimeter retroperitoneal nodes, indeterminate.  Scattered nodes along the sigmoid mesocolon measuring up to 10 mm (series 2/image 52), along with bilateral internal iliac nodes measuring 19-20 mm (series 2/image 57), compatible with nodal metastases.  Reproductive: Status post hysterectomy.  7.4 x 5.7 x 6.1 cm necrotic pelvic mass (series 2/image 62), likely arising from the vaginal cuff, previously 3.2 x 5.6 cm. Gas within the lesion suggest direct communication with the sigmoid colon/ rectum. Additionally, the mass abuts the posterior wall of the bladder, which is also likely involved.  Other:  No abdominopelvic ascites.  Musculoskeletal: Degenerative changes of the thoracolumbar spine, most prominent at L5-S1.  IMPRESSION: 7.4 cm necrotic pelvic mass arising from the vaginal cuff, increased.  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 Suspected involvement of the bladder.  Associated pelvic nodal metastases.  Moderate right hydroureteronephrosis, likely reflecting extrinsic compression by tumor.   Electronically Signed   By: Julian Hy M.D.   On: 05/22/2014 21:59    Chart has been reviewed  Assessment/Plan  59 year old female with history of severe anxiety and depression with advanced endometrial cancer in the setting of patient's noncompliance and failure to follow-up presents with UTI, new diagnosis of hydronephrosis likely secondary to tumor burden and now the involvement of sigmoid colon. Patient was undergoing radiation therapy but was not able to finish. Present on Admission:  . Sepsis - patient's meets sepsis with great today and will administer IV antibiotics monitor given the fluids obtain blood cultures lactic acid within normal limits which is reassuring  . Essential hypertension currently stable continue to follow  . Endometrial ca - continues to progress, patient's with poor insight, is not wished to discuss severity of her prognosis if left untreated will need oncology consult in the morning to determine plan of care. This is a very challenging case.  Marland Kitchen Extrinsic asthma stable albuterol as needed  . Anxiety state continue Ativan as needed  . Cancer associated pain - Dilaudid as needed patient would probably benefit from longer lasting medication for baseline pain control she will probably benefit from palliative care consult to help with pain management and to help establish goals of care  Hydronephrosis likely secondary to tumor burden extrinsic compression. Can discuss with urology in AM to see if paliative stenting could be an option.  If patient would be interested in procedure.    Prophylaxis:   Lovenox, Protonix  CODE STATUS:  FULL CODE presumed to be  Other plan as per orders.  I have spent a total of 65 min on this admission, extra time was taken to discuss care with radiology  [place] 05/22/2014, 10:54 PM  [facility]  Pager [phone]   after 2 AM please page floor coverage PA If 7AM-7PM, please contact the day team taking care of the patient  [email or web address]  Password [email or web address]

## 2014-05-23 ENCOUNTER — Encounter: Payer: Self-pay | Admitting: Radiation Oncology

## 2014-05-23 DIAGNOSIS — C541 Malignant neoplasm of endometrium: Secondary | ICD-10-CM

## 2014-05-23 DIAGNOSIS — Z882 Allergy status to sulfonamides status: Secondary | ICD-10-CM | POA: Diagnosis not present

## 2014-05-23 DIAGNOSIS — G893 Neoplasm related pain (acute) (chronic): Secondary | ICD-10-CM | POA: Diagnosis present

## 2014-05-23 DIAGNOSIS — F29 Unspecified psychosis not due to a substance or known physiological condition: Secondary | ICD-10-CM | POA: Diagnosis present

## 2014-05-23 DIAGNOSIS — C775 Secondary and unspecified malignant neoplasm of intrapelvic lymph nodes: Secondary | ICD-10-CM | POA: Diagnosis present

## 2014-05-23 DIAGNOSIS — F411 Generalized anxiety disorder: Secondary | ICD-10-CM | POA: Diagnosis present

## 2014-05-23 DIAGNOSIS — N390 Urinary tract infection, site not specified: Secondary | ICD-10-CM | POA: Diagnosis present

## 2014-05-23 DIAGNOSIS — A419 Sepsis, unspecified organism: Secondary | ICD-10-CM | POA: Diagnosis not present

## 2014-05-23 DIAGNOSIS — G4730 Sleep apnea, unspecified: Secondary | ICD-10-CM | POA: Diagnosis present

## 2014-05-23 DIAGNOSIS — E876 Hypokalemia: Secondary | ICD-10-CM | POA: Diagnosis not present

## 2014-05-23 DIAGNOSIS — F4323 Adjustment disorder with mixed anxiety and depressed mood: Secondary | ICD-10-CM | POA: Diagnosis present

## 2014-05-23 DIAGNOSIS — C7911 Secondary malignant neoplasm of bladder: Secondary | ICD-10-CM | POA: Diagnosis present

## 2014-05-23 DIAGNOSIS — C785 Secondary malignant neoplasm of large intestine and rectum: Secondary | ICD-10-CM | POA: Diagnosis present

## 2014-05-23 DIAGNOSIS — N1330 Unspecified hydronephrosis: Secondary | ICD-10-CM | POA: Diagnosis present

## 2014-05-23 DIAGNOSIS — C7982 Secondary malignant neoplasm of genital organs: Secondary | ICD-10-CM | POA: Diagnosis present

## 2014-05-23 DIAGNOSIS — Z90710 Acquired absence of both cervix and uterus: Secondary | ICD-10-CM | POA: Diagnosis not present

## 2014-05-23 DIAGNOSIS — E039 Hypothyroidism, unspecified: Secondary | ICD-10-CM | POA: Diagnosis present

## 2014-05-23 DIAGNOSIS — I499 Cardiac arrhythmia, unspecified: Secondary | ICD-10-CM | POA: Diagnosis present

## 2014-05-23 DIAGNOSIS — M797 Fibromyalgia: Secondary | ICD-10-CM | POA: Diagnosis present

## 2014-05-23 DIAGNOSIS — Z8249 Family history of ischemic heart disease and other diseases of the circulatory system: Secondary | ICD-10-CM | POA: Diagnosis not present

## 2014-05-23 DIAGNOSIS — M4306 Spondylolysis, lumbar region: Secondary | ICD-10-CM | POA: Diagnosis present

## 2014-05-23 DIAGNOSIS — R1900 Intra-abdominal and pelvic swelling, mass and lump, unspecified site: Secondary | ICD-10-CM | POA: Diagnosis not present

## 2014-05-23 DIAGNOSIS — J45909 Unspecified asthma, uncomplicated: Secondary | ICD-10-CM | POA: Diagnosis present

## 2014-05-23 DIAGNOSIS — Z515 Encounter for palliative care: Secondary | ICD-10-CM | POA: Diagnosis not present

## 2014-05-23 DIAGNOSIS — I10 Essential (primary) hypertension: Secondary | ICD-10-CM | POA: Diagnosis present

## 2014-05-23 DIAGNOSIS — Z9114 Patient's other noncompliance with medication regimen: Secondary | ICD-10-CM | POA: Diagnosis present

## 2014-05-23 DIAGNOSIS — Z66 Do not resuscitate: Secondary | ICD-10-CM | POA: Diagnosis present

## 2014-05-23 DIAGNOSIS — E669 Obesity, unspecified: Secondary | ICD-10-CM | POA: Diagnosis present

## 2014-05-23 DIAGNOSIS — Z809 Family history of malignant neoplasm, unspecified: Secondary | ICD-10-CM | POA: Diagnosis not present

## 2014-05-23 DIAGNOSIS — Z90722 Acquired absence of ovaries, bilateral: Secondary | ICD-10-CM | POA: Diagnosis present

## 2014-05-23 DIAGNOSIS — Z79899 Other long term (current) drug therapy: Secondary | ICD-10-CM | POA: Diagnosis not present

## 2014-05-23 DIAGNOSIS — Z6834 Body mass index (BMI) 34.0-34.9, adult: Secondary | ICD-10-CM | POA: Diagnosis not present

## 2014-05-23 LAB — COMPREHENSIVE METABOLIC PANEL
ALBUMIN: 2.9 g/dL — AB (ref 3.5–5.2)
ALT: 13 U/L (ref 0–35)
AST: 20 U/L (ref 0–37)
Alkaline Phosphatase: 57 U/L (ref 39–117)
Anion gap: 8 (ref 5–15)
BUN: 10 mg/dL (ref 6–23)
CALCIUM: 8.3 mg/dL — AB (ref 8.4–10.5)
CO2: 22 mmol/L (ref 19–32)
Chloride: 101 mmol/L (ref 96–112)
Creatinine, Ser: 0.99 mg/dL (ref 0.50–1.10)
GFR calc Af Amer: 71 mL/min — ABNORMAL LOW (ref >90)
GFR, EST NON AFRICAN AMERICAN: 61 mL/min — AB (ref >90)
Glucose, Bld: 96 mg/dL (ref 70–99)
Potassium: 3.4 mmol/L — ABNORMAL LOW (ref 3.5–5.1)
SODIUM: 131 mmol/L — AB (ref 135–145)
TOTAL PROTEIN: 6.7 g/dL (ref 6.0–8.3)
Total Bilirubin: 0.6 mg/dL (ref 0.3–1.2)

## 2014-05-23 LAB — MRSA PCR SCREENING: MRSA by PCR: NEGATIVE

## 2014-05-23 LAB — CBC
HCT: 29.4 % — ABNORMAL LOW (ref 36.0–46.0)
Hemoglobin: 9.7 g/dL — ABNORMAL LOW (ref 12.0–15.0)
MCH: 28.3 pg (ref 26.0–34.0)
MCHC: 33 g/dL (ref 30.0–36.0)
MCV: 85.7 fL (ref 78.0–100.0)
Platelets: 206 10*3/uL (ref 150–400)
RBC: 3.43 MIL/uL — AB (ref 3.87–5.11)
RDW: 12.9 % (ref 11.5–15.5)
WBC: 12.6 10*3/uL — AB (ref 4.0–10.5)

## 2014-05-23 LAB — PHOSPHORUS: Phosphorus: 3.9 mg/dL (ref 2.3–4.6)

## 2014-05-23 LAB — I-STAT CG4 LACTIC ACID, ED: Lactic Acid, Venous: 0.86 mmol/L (ref 0.5–2.0)

## 2014-05-23 LAB — MAGNESIUM: MAGNESIUM: 2 mg/dL (ref 1.5–2.5)

## 2014-05-23 LAB — TSH: TSH: 22.742 u[IU]/mL — ABNORMAL HIGH (ref 0.350–4.500)

## 2014-05-23 MED ORDER — ACETAMINOPHEN 325 MG PO TABS
650.0000 mg | ORAL_TABLET | Freq: Four times a day (QID) | ORAL | Status: DC | PRN
  Administered 2014-05-23: 650 mg via ORAL
  Filled 2014-05-23: qty 2

## 2014-05-23 MED ORDER — ONDANSETRON HCL 4 MG/2ML IJ SOLN: 4.0000 mg | Freq: Four times a day (QID) | INTRAMUSCULAR | Status: DC | PRN

## 2014-05-23 MED ORDER — HYDROCODONE-ACETAMINOPHEN 5-325 MG PO TABS
1.0000 | ORAL_TABLET | ORAL | Status: DC | PRN
  Administered 2014-05-25: 2 via ORAL
  Filled 2014-05-23 (×4): qty 2

## 2014-05-23 MED ORDER — LEVOTHYROXINE SODIUM 125 MCG PO TABS
125.0000 ug | ORAL_TABLET | Freq: Every day | ORAL | Status: DC
  Administered 2014-05-23 – 2014-05-27 (×5): 125 ug via ORAL
  Filled 2014-05-23 (×6): qty 1

## 2014-05-23 MED ORDER — DOCUSATE SODIUM 100 MG PO CAPS
100.0000 mg | ORAL_CAPSULE | Freq: Two times a day (BID) | ORAL | Status: DC
  Administered 2014-05-23 – 2014-06-06 (×12): 100 mg via ORAL
  Filled 2014-05-23 (×34): qty 1

## 2014-05-23 MED ORDER — PIPERACILLIN-TAZOBACTAM 3.375 G IVPB
3.3750 g | Freq: Three times a day (TID) | INTRAVENOUS | Status: DC
  Administered 2014-05-23 – 2014-05-27 (×13): 3.375 g via INTRAVENOUS
  Filled 2014-05-23 (×14): qty 50

## 2014-05-23 MED ORDER — SERTRALINE HCL 100 MG PO TABS
200.0000 mg | ORAL_TABLET | Freq: Every evening | ORAL | Status: DC
  Administered 2014-05-23 – 2014-06-05 (×14): 200 mg via ORAL
  Filled 2014-05-23 (×15): qty 2

## 2014-05-23 MED ORDER — LORAZEPAM 1 MG PO TABS
1.0000 mg | ORAL_TABLET | Freq: Two times a day (BID) | ORAL | Status: DC
  Administered 2014-05-23 – 2014-06-06 (×30): 1 mg via ORAL
  Filled 2014-05-23 (×31): qty 1

## 2014-05-23 MED ORDER — HYDROMORPHONE HCL 1 MG/ML IJ SOLN
1.0000 mg | INTRAMUSCULAR | Status: DC | PRN
  Administered 2014-05-23 – 2014-05-24 (×9): 1 mg via INTRAVENOUS
  Filled 2014-05-23 (×10): qty 1

## 2014-05-23 MED ORDER — THIOTHIXENE 2 MG PO CAPS
2.0000 mg | ORAL_CAPSULE | Freq: Every day | ORAL | Status: DC
  Administered 2014-05-23 – 2014-05-27 (×5): 2 mg via ORAL
  Filled 2014-05-23 (×6): qty 1

## 2014-05-23 MED ORDER — ONDANSETRON HCL 4 MG PO TABS: 4.0000 mg | ORAL_TABLET | Freq: Four times a day (QID) | ORAL | Status: DC | PRN

## 2014-05-23 MED ORDER — ACETAMINOPHEN 650 MG RE SUPP: 650.0000 mg | Freq: Four times a day (QID) | RECTAL | Status: DC | PRN

## 2014-05-23 MED ORDER — ENOXAPARIN SODIUM 40 MG/0.4ML ~~LOC~~ SOLN
40.0000 mg | SUBCUTANEOUS | Status: DC
  Administered 2014-05-23 – 2014-06-06 (×13): 40 mg via SUBCUTANEOUS
  Filled 2014-05-23 (×15): qty 0.4

## 2014-05-23 MED ORDER — HYDROMORPHONE HCL 4 MG PO TABS
4.0000 mg | ORAL_TABLET | ORAL | Status: DC | PRN
Start: 2014-05-23 — End: 2014-05-24
  Administered 2014-05-23 – 2014-05-24 (×3): 4 mg via ORAL
  Filled 2014-05-23 (×3): qty 1

## 2014-05-23 MED ORDER — ALBUTEROL SULFATE (2.5 MG/3ML) 0.083% IN NEBU: 3.0000 mL | INHALATION_SOLUTION | RESPIRATORY_TRACT | Status: DC | PRN

## 2014-05-23 MED ORDER — SODIUM CHLORIDE 0.9 % IV SOLN
INTRAVENOUS | Status: AC
  Administered 2014-05-23: 03:00:00 via INTRAVENOUS

## 2014-05-23 NOTE — Progress Notes (Signed)
Patient slept through most of day.  Received 2 doses of IV Dilaudid during the day shift today.  At [phone] and 1346.Marland KitchenMarland KitchenOn each occasion, she referred to her pain as a 10, was tearful, childlike, and difficult to reason with.  She asked about the dose and when she learned she was receiving 1 mg of Dilaudid, she cried further and insisted upon more medication.  In between these outbursts, she slept.    Patient's mother present in room.  She is also prone to crying jags.  Both appear to have a strong sense of denial regarding the reality of the patient's condition/prognosis.  They were angered that a physician had told them that the patient was nearing death.  They found this highly unreasonable .   Spoke with Wonda Cerise regarding the Palliative team seeing the pt tomorrow---and lending a hand with pain control issues.

## 2014-05-23 NOTE — Progress Notes (Signed)
Consult Note: Gyn-Onc  [name] 59 y.o. female  CC:  Chief Complaint  Patient presents with  . Abdominal Pain  . Nausea  . Emesis  . Constipation    HPI: Patient is a 59 year old gravida 1 para 1 who I saw in 2013. At that time she had more than a year of bleeding. She was seen by Dr. Delsa Sale and had an endometrial biopsy revealed a grade 1 endometrial carcinoma. I initially saw her in April of 2013 but she delayed care and subsequently underwent hysterectomy in August of 2013. 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 - NO EVIDENCE OF ANGIOLYMPHATIC INVASION OR CERVICAL STROMAL INVOLVEMENT IDENTIFIED. - CERVIX: BENIGN SQUAMOUS MUCOSA AND ENDOCERVICAL MUCOSA. NO DYSPLASIA ORMALIGNANCY. - BILATERAL OVARIES AND FALLOPIAN TUBES: NO PATHOLOGIC ABNORMALITIES. Microscopic Comment UTERUS Specimen: Uterus, bilateral ovaries and fallopian tubes Procedure: Total hysterectomy and bilateral salpingo-oophorectomy Lymph node sampling performed: No Specimen integrity: Intact Maximum tumor size (cm): 4.0 cm Histologic type: Invasive endometrioid carcinoma arising in an endometrial polyp Grade: FIGO grade II Myometrial invasion: 0.3 cm where myometrium is 2.4 cm in thickness Cervical stromal involvement: No Extent of involvement of other organs: No Lymph vascular invasion: Not identified Peritoneal washings: N/A Lymph nodes: number examined N/A ; number positive N/A TNM code: pT1a, pNX FIGO Stage (based on pathologic findings, needs clinical correlation): IA  I had not seen her since that time despite the recommendation for followup of multiple calls and attempts to get her in. She did not require any adjuvant therapy as she had a minimally invasive disease with only a grade 2  lesion but still required close followup. She states that she was not aware of the need for followup. I received a call from the emergency room on November 22, 2013. Emergency room physician stated that he is worse concern for recurrence of her endometrial cancer. The patient had a followup appointment the subsequent day on August 28 for Dr. Delsa Sale that she was aware of. The patient did not keep that appointment. We had requested that she keep that appointment have a biopsy performed as well as a CT scan prior to today's visit which she did not do either.   I ultimately saw her on September 16. At that time there was a 5-6 cm mass replacing the vaginal cuff. Biopsy confirmed a recurrence of her endometrial cancer. CT scan revealed: Lymph Nodes: New pelvic lymphadenopathy is seen at both iliac bifurcations measuring 2.1 cm on the left and 2.0 cm on the right. No other sites of lymphadenopathy identified.  Reproductive: Prior hysterectomy noted. A lobulated soft tissue mass is seen which is centered in the hysterectomy bed involves the vaginal cuff and posterior wall of the urinary bladder. This is new and and shows areas of internal necrosis. This measures 3.7 x 6.9 cm on image 62.  Vascular: No evidence of abdominal aortic aneurysm.  Musculoskeletal: No suspicious bone lesions identified.  Other: None.  IMPRESSION: New lobulated soft tissue mass in the central pelvis involving the vaginal cuff and posterior wall of bladder, consistent with recurrent carcinoma.  New bilateral iliac lymphadenopathy, consistent with metastatic disease.  Interval History:  She was referred to radiation oncology and was seen by Dr. Teryl Lucy. Please refer to the epic medical record for details. In short the patient only completed one and a  half weeks of a planned 6 week cycle of chemotherapy. There were numerous attempts to have the patient come in for treatment and transportation was arranged and the  patient would refuse to come. I was contacted by Dr. Lester Kinsman today. She is a hospitals was admitted the patient and she has asked that we come see her.  CT currently reveals: Scattered nodes along the sigmoid mesocolon measuring up to 10 mm (series 2/image 52), along with bilateral internal iliac nodes measuring 19-20 mm (series 2/image 57), compatible with nodal metastases.  Reproductive: Status post hysterectomy.  7.4 x 5.7 x 6.1 cm necrotic pelvic mass (series 2/image 62), likely arising from the vaginal cuff, previously 3.2 x 5.6 cm. Gas within the lesion suggest direct communication with the sigmoid colon/ rectum. Additionally, the mass abuts the posterior wall of the bladder, which is also likely involved.  Other: No abdominopelvic ascites.  Musculoskeletal: Degenerative changes of the thoracolumbar spine, most prominent at L5-S1.  IMPRESSION: 7.4 cm necrotic pelvic mass arising from the vaginal cuff, increased.  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 Suspected involvement of the bladder.  Associated pelvic nodal metastases.  Moderate right hydroureteronephrosis, likely reflecting extrinsic compression by tumor.  Greater than 1 hour face-to-face time was spent with the patient and her mother. [name] was with me in the room The patient was very upset when I discussed with her the results of her CT scan. At times she was tearful, at times she was yelling at me, she initially refused to discuss and review the CT scan but ultimately she did. I went over line by line the results of the CT scan. Her mother was in the room also very tearful. Her mother stated that she was never told that this was such a significant issue and that she had never heard of anyone dying of cancer before, and then proceeded to tell me all of the people that she knows who have died of cancer.   I had to disagree with their assertions that they were not told of the  significance of her recurrence as I myself and told him how serious this recurrence was and multiple attempts have been made to have her come to the hospital for her radiation treatment. They do admit  that they will contacted numerous times but wasn't that she "didn't want to come," it was that she "was too sick to come".  I told them today that her cancer was not curative. I told them that I felt palliative care would be very helpful in terms of pain control. I discussed with them that I believed hospice was in her best interest. Again is difficult to understand how much of this they heard as they were both often talking over me and talking at the same time. Ultimately I discussed with them that I would speak to Dr. [name] to see if he would be willing to consider palliative radiation. I told them that this was not curative but it might help with her pain and they would very much like [name] to do so. I do not think that she is a candidate for chemotherapy. I have spoken to Dr. Marko Plume in medical oncology. With her lack of compliance if she were to become neutropenic it would be a significant issue with regards to follow-up. I do not think she is a candidate for diverting colostomy as I do not believe that she would ultimately be able to care for her. After discussion with Dr. Freddi Che he  would be willing to consider a 10 day course of palliative radiation. I believe that the only way for this to occur would be for the patient to be admitted and stay in the hospital throughout that course. This could also happen concomitantly with attempts to improve her pain control. I have communicated this with Dr.Mikechail and she believes that this is a reasonable option.  Current Meds: @[facility] @  Allergy:  Allergies  Allergen Reactions  . Sulfa Antibiotics Other (See Comments)    Unknown-pt states she is unsure of what her reaction is  . Sulfamethoxazole-Trimethoprim Other (See Comments)    Unknown reaction    Social  Hx:   History   Social History  . Marital Status: Single    Spouse Name: N/A  . Number of Children: 1  . Years of Education: 12th   Occupational History  . disabled    Social History Main Topics  . Smoking status: Never Smoker   . Smokeless tobacco: Never Used  . Alcohol Use: No  . Drug Use: No  . Sexual Activity: Not on file   Other Topics Concern  . Not on file   Social History Narrative    Past Surgical Hx:  Past Surgical History  Procedure Laterality Date  . Cesarean section    . Abdominal hysterectomy      total    Past Medical Hx:  Past Medical History  Diagnosis Date  . Thyroid disease   . Arthritis   . Asthma   . Depression   . Fibromyalgia   . Lumbar spondylolysis   . Allergy   . Endometrial cancer   . Spinal stenosis   . Hypertension   . Dysrhythmia     OCCAS PAC'S AND PVC'S  PER HOLTER MONITOR STUDY --PT HAS OCCAS CHEST PAINS AND PALPITATIONS--HAD ECHO NORMAL LV SIZE AND FUNCTION.  Marland Kitchen Sleep apnea     HAS CPAP MACHINE BUT DOES NOT USE  . Anxiety     HAS OCD AND ON DISABILITY-PER [name]   . Back pain, chronic   . Obese     Oncology Hx:    Endometrial ca   07/21/2011 Initial Diagnosis Endometrial ca   11/23/2011 Surgery Robotic hysterectomy bilateral salpingo-oophorectomy.    Family Hx:  Family History  Problem Relation Age of Onset  . Hypertension Mother   . Coronary artery disease Father   . Cancer Brother     Renal cell carcinoma  . Hypertension Other   . Cancer Other   . Heart attack Father   . Coronary artery disease Brother     Vitals:  Blood pressure 103/62, pulse 72, temperature 98.3 F (36.8 C), temperature source Oral, resp. rate 16, height 4\' 10"  (1.473 m), SpO2 97 %.  Physical Exam:   Assessment/Plan: 59 year old with recurrent stage IA grade 2 endometrial carcinoma. She had a vaginal cuff recurrence that became more of a pelvic mass and is now directly invading and involving most likely the bladder as well as  the rectosigmoid colon as well as pelvic lymph nodes. She is not a candidate for an exonerative type procedure based on the nodal metastases.  I told them today that her cancer was no longer curative. It may have been at the time of her initial recurrence. I told them that I felt palliative care would be very helpful in terms of pain control. I discussed with them that I believed hospice was in her best interest. Again is difficult to understand how much of  this they heard as they were both often talking over me and talking at the same time. Ultimately I discussed with them that I would speak to Dr. [name] to see if he would be willing to consider palliative radiation. I told them that this was not curative but it might help with her pain and they would very much like [name] to do so. I do not think that she is a candidate for chemotherapy. I have spoken to Dr. Marko Plume in medical oncology. With her lack of compliance if she were to become neutropenic it would be a significant issue with regards to follow-up. I do not think she is a candidate for diverting colostomy as I do not believe that she would ultimately be able to care for her. After discussion with Dr. [name], he would be willing to consider a 10 day course of palliative radiation. I believe that the only way for this to occur would be for the patient to be admitted and stay in the hospital throughout that course. This could also happen concomitantly with attempts to improve her pain control. I have communicated this with Dr.Mikechail and she believes that this is a reasonable option. Dr. [name] will see the patient this afternoon and he will begin palliative radiation tomorrow  Please refer free to contact me. I will not be back in [place] until March 3. However, [employer] and Ever[facility] Amber are available to see the patient. Our office number is [phone].  Nancy Marus A., MD 05/23/2014, 5:16 PM

## 2014-05-23 NOTE — ED Notes (Signed)
Patient resting quietly, eyes closed, NAD noted.

## 2014-05-23 NOTE — ED Notes (Signed)
Lab at bedside to collect istat lactic acid.

## 2014-05-23 NOTE — Clinical Social Work Note (Signed)
[place] Work  Clinical Social Work was notified of patient admission by outpatient clinical nursing staff.  CSW contacted Wadie Lessen, Palliative Care NP, and briefly discussed patient's current psychosocial situation.  CSW attempted to meet with patient to provide emotional support [phone], however, patient sleeping at time of visit.  CSW will follow up with patient tomorrow during Palliative Care visit.   Polo Riley, MSW, LCSW, OSW-C Clinical Social Worker [facility] [phone]

## 2014-05-23 NOTE — ED Notes (Signed)
Dr. Doutova at bedside.  

## 2014-05-23 NOTE — Progress Notes (Signed)
Utilization review completed.  

## 2014-05-23 NOTE — Progress Notes (Signed)
Chaplain paged to provide spiritual care to [name]. [name] is a 59 year old oncology patient who recently has been told that there is little more medically that can be done to arrest and/or cure her cancer. She was weeping through most of the visit, asking the typical questions of "why." She reports her stress levels are at an eight on a scale of 1 to 10 with 1 being low stress and 10 being unbearable stress, [name] reports that having someone listen to her for a longer period was beneficial. She self reports that the chaplain visit assisted her dealing with the reality of her current situation. During the visit [name]'s 80 year old mother came into the room. The mother is deeply stressed about the prospect of her daughter dying before she dies. [name] reports her mother has said that should she die, her mother may take her own life. The mother avoided this subject, replying that she does not believe there is nothing more to be done for her daughter, and she will be "lost" without her shining presence in her life. Follow up listening and probing to check for SI on the part of the mother is needed.   [name] indicates she wishes to speak to someone about Hospice Care at one of the several Hospices in the area. Staff indicates that a consult has been sent to the Palliative Team,   Recommend chaplain be paged if Tracy Hoffmeier needs or request further spiritual care tonight and that a daytime chaplain follow up during the day shift on 24 May 2014.  Sallee Lange. Shanae Luo, DMin, MDiv Chaplain

## 2014-05-23 NOTE — Progress Notes (Signed)
[facility]                                                                              Patient Demographics  [name], is a 59 y.o. female, DOB - 03-May-1955, IWP:[id number]  Admit date - 05/22/2014   Admitting Physician [name], MD  Outpatient Primary MD for the patient is [name], FNP  LOS - 0   Chief Complaint  Patient presents with  . Abdominal Pain  . Nausea  . Emesis  . Constipation      HPI on 05/22/2014 by Dr. [name] Patient with complex medical hx. extensive history of noncompliance and drug seeking behavior and polysubstance abuse. 2013 patient was found to have early stage endometrial cancer. 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 Patient did not follow up with gyn oncology.  In 2015 she presented with vaginal bleeding, pelvic pain and discharge. 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  CT at that time showed new lobulated soft tissue mass involving vaginal cuff and posterior wall of the urinary bladder, with areas of internal necrosis, measuring 3.7 x 6.9 cm; there was also new pelvic adenopathy at iliac bifurcations bilaterally, but no obvious disease outside of pelvis, including nothing involving bowels, and no hydronephrosis. Patient had an admission for diverticulitis in October 2015 and ended time was able to be seen in-house by radiation oncology and oncology which point palliative radiation therapy has been started. Patient has been going to her treatments intermittently. She has completed only about 1.5 [name] has 6 treatments. Radiation oncologist started her on Dilaudid 4 mg as needed for pain. She is supposed to follow-up with pain clinic but canceled her appointment. Patient continued to  have worsening abdominal pain but started to have stabbing pain in her vaginal and rectal area. She have had frequent urination in seen pus from her rectal area. She has been intermittently tearful. Reports low-grade fevers. Today she was seen again by radiation oncology. Who has discussed potential hospice or palliative care evaluation. Patient was recommended to present to emergency department so that the father workup could be done. Patient currently resides with her 70 year old mother. Scan today showed progression of her tumor burden 7 .4 cm necrotic pelvic mass arising from the vaginal cuff, Increased. 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 Suspected involvement of the bladder. Moderate right hydroureteronephrosis, likely reflecting extrinsic compression by tumor. Unfortunately patient and the family have very poor insight. Patient has been verbally abusive to the staff and her family. At this point she is concentrated on pain management. Neither patient nor her family at this point is ready to have a conversation about her prognosis stating that "she'll be just fine". Unable to finish interview due to patient becoming agitated and screaming out.  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 UA showed evidence of UTI/ pateint with elevated WBC. Transiently tachycardic. NO nausea or vomiting. She refuses trial of PO pain medications and threatens to leave AMA.  Assessment & Plan   Sepsis secondary to UTI -Upon admission, patient was febrile, tachycardic, with leukocytosis -UA: WBC TNTC, moderate leukocytes, many bacteria -pending urine and blood culture -Continue zosyn, IV fluids  Metastatic Endometrial cancer with hydronephrosis/ Cancer associated pain -CT abdomen and pelvis shows 7.4 similar necrotic pelvic mass arising in the vaginal cuff, increased. Suspected fistula communication with  the rectosigmoid colon, involvement of bladder, dural metastasis, moderate right hydronephrosis  -Oncology, radiation oncology, clinical electrical oncology, urology consulted and appreciated  -Palliative care consult and appreciated  -urology did not recommend tube as patient has been noncompliant in the past, also renal function is normal -Will monitor intake/output -Seems that patient was noncompliant with her radiation treatments as well as office visits  -Continue pain control with IV and PO Dilaudid -Patient has demanded that IV Dilaudid be increased or she will leave AGAINST MEDICAL ADVICE  Essential hypertension -Currently stable, on no home medications -Will continue to monitor  Hypothyroidism -Continue Synthroid  Anxiety/Depression/Psychosis -Continue Ativan, sertraline, thiothixene  Asthma -Currently stable, continue albuterol PRN  Code Status: Full  Family Communication: Mother at bedside  Disposition Plan: Admitted, pending further recommendations and palliative consult   Time Spent in minutes   30 minutes  Procedures  None  Consults   Oncology Radiation oncology [facility], via phone Palliative care  DVT Prophylaxis  lovenox  Lab Results  Component Value Date   PLT 206 05/23/2014    Medications  Scheduled Meds: . docusate sodium  100 mg Oral BID  . enoxaparin (LOVENOX) injection  40 mg Subcutaneous Q24H  . levothyroxine  125 mcg Oral QAC breakfast  . LORazepam  1 mg Oral BID  . piperacillin-tazobactam (ZOSYN)  IV  3.375 g Intravenous Q8H  . sertraline  200 mg Oral QPM  . thiothixene  2 mg Oral QHS   Continuous Infusions:  PRN Meds:.acetaminophen **OR** acetaminophen, albuterol, HYDROcodone-acetaminophen, HYDROmorphone (DILAUDID) injection, HYDROmorphone, ondansetron **OR** ondansetron (ZOFRAN) IV  Antibiotics    Anti-infectives    Start     Dose/Rate Route Frequency Ordered Stop   05/23/14 0830  piperacillin-tazobactam (ZOSYN)  IVPB 3.375 g     3.375 g 12.5 mL/hr over 240 Minutes Intravenous Every 8 hours 05/23/14 0804     05/22/14 2215  cefTRIAXone (ROCEPHIN) 1 g in dextrose 5 % 50 mL IVPB     1 g 100 mL/hr over 30 Minutes Intravenous  Once 05/22/14 2209 05/22/14 2342        Subjective:   Caren Griffins Stager seen and examined today.  Patient tearful and wishes for more pain medication or she will leave AMA.  She does not answer many questions.     Objective:   Filed Vitals:   05/22/14 2314 05/23/14 0011 05/23/14 0210 05/23/14 0948  BP: 99/57  94/53 103/62  Pulse: 87  95 72  Temp:  99.8 F (37.7 C) 101.2 F (38.4 C) 98.3 F (36.8 C)  TempSrc:  Oral Oral Oral  Resp:    16  Height:   4\' 10"  (1.473 m)   SpO2: 100%  97% 97%    Wt Readings from Last 3 Encounters:  01/25/14 75.569 kg (166 lb 9.6 oz)  01/06/14 79.243 kg (174 lb 11.2 oz)  12/12/13 80.74 kg (178 lb)    No intake or output data in the 24 hours ending 05/23/14 1603  Exam  General: Well developed, well nourished,   HEENT: NCAT, mucous membranes dry   Cardiovascular: S1 S2 auscultated, no rubs, murmurs or gallops.  Regular rate and rhythm.  Respiratory: Clear to auscultation bilaterally with equal chest rise  Abdomen: Soft, tender-suprapubic, nondistended, + bowel sounds  Extremities: warm dry without cyanosis clubbing or edema  Neuro: AAOx3, nonfocal  Psych: Anxious, tearful, angry  Data Review   Micro Results Recent Results (from the past 240 hour(s))  MRSA PCR Screening     Status: None   Collection Time: 05/23/14  2:55 AM  Result Value Ref Range Status   MRSA by PCR NEGATIVE NEGATIVE Final    Comment:        The GeneXpert MRSA Assay (FDA approved for NASAL specimens only), is one component of a comprehensive MRSA colonization surveillance program. It is not intended to diagnose MRSA infection nor to guide or monitor treatment for MRSA infections.     Radiology Reports Ct Abdomen Pelvis W  Contrast  05/22/2014   CLINICAL DATA:  Endometrial cancer, nausea, pelvic drainage  EXAM: CT ABDOMEN AND PELVIS WITH CONTRAST  TECHNIQUE: Multidetector CT imaging of the abdomen and pelvis was performed using the standard protocol following bolus administration of intravenous contrast.  CONTRAST:  132mL OMNIPAQUE IOHEXOL 300 MG/ML SOLN, 58mL OMNIPAQUE IOHEXOL 300 MG/ML SOLN  COMPARISON:  03/06/2014  FINDINGS: Lower chest:  Lung bases are clear.  Hepatobiliary: Liver is within normal limits.  Gallbladder is unremarkable. No intrahepatic or extrahepatic ductal dilatation.  Pancreas: Within normal limits.  Spleen: Splenomegaly, measuring 15.5 cm in maximal dimension.  Adrenals/Urinary Tract: Adrenal glands are within normal limits.  Left kidney is within normal limits.  Malrotated right kidney. Moderate hydroureteronephrosis, likely secondary to extrinsic compression.  Thick-walled bladder.  Stomach/Bowel: Stomach is within normal limits.  No evidence of bowel obstruction.  Appendix is not discretely visualized and may be surgically absent.  Moderate colonic stool burden, suggesting constipation.  Sigmoid colon is thick-walled and likely involved via the pelvic mass (described below).  Mass like thickening involving the rectum (series 2/image 57), new.  Vascular/Lymphatic: No evidence of abdominal aortic aneurysm.  Small subcentimeter retroperitoneal nodes, indeterminate.  Scattered nodes along the sigmoid mesocolon measuring up to 10 mm (series 2/image 52), along with bilateral internal iliac nodes measuring 19-20 mm (series 2/image 57), compatible with nodal metastases.  Reproductive: Status post hysterectomy.  7.4 x 5.7 x 6.1 cm necrotic pelvic mass (series 2/image 62), likely arising from the vaginal cuff, previously 3.2 x 5.6 cm. Gas within the lesion suggest direct communication with the sigmoid colon/ rectum. Additionally, the mass abuts the posterior wall of the bladder, which is also likely involved.  Other:  No abdominopelvic ascites.  Musculoskeletal: Degenerative changes of the thoracolumbar spine, most prominent at L5-S1.  IMPRESSION: 7.4 cm necrotic pelvic mass arising from the vaginal cuff, increased.  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 Suspected involvement of the bladder.  Associated pelvic nodal metastases.  Moderate right hydroureteronephrosis, likely reflecting extrinsic compression by tumor.   Electronically Signed   By: Julian Hy M.D.   On: 05/22/2014 21:59    CBC  Recent Labs Lab 05/22/14 1823 05/23/14 0605  WBC 13.4* 12.6*  HGB 10.5* 9.7*  HCT 32.5* 29.4*  PLT 220 206  MCV 86.4 85.7  MCH 27.9 28.3  MCHC 32.3 33.0  RDW 12.9 12.9  LYMPHSABS 1.4  --   MONOABS 1.2*  --   EOSABS 0.5  --   BASOSABS 0.0  --     Chemistries   Recent Labs Lab 05/22/14 1823 05/23/14 0605  NA 136 131*  K 4.0 3.4*  CL  103 101  CO2 24 22  GLUCOSE 107* 96  BUN 11 10  CREATININE 1.03 0.99  CALCIUM 9.0 8.3*  MG  --  2.0  AST 19 20  ALT 15 13  ALKPHOS 61 57  BILITOT 0.5 0.6   ------------------------------------------------------------------------------------------------------------------ estimated creatinine clearance is 52.8 mL/min (by C-G formula based on Cr of 0.99). ------------------------------------------------------------------------------------------------------------------ No results for input(s): HGBA1C in the last 72 hours. ------------------------------------------------------------------------------------------------------------------ No results for input(s): CHOL, HDL, LDLCALC, TRIG, CHOLHDL, LDLDIRECT in the last 72 hours. ------------------------------------------------------------------------------------------------------------------  Recent Labs  05/23/14 0505  TSH 22.742*   ------------------------------------------------------------------------------------------------------------------ No results for input(s):  VITAMINB12, FOLATE, FERRITIN, TIBC, IRON, RETICCTPCT in the last 72 hours.  Coagulation profile No results for input(s): INR, PROTIME in the last 168 hours.  No results for input(s): DDIMER in the last 72 hours.  Cardiac Enzymes No results for input(s): CKMB, TROPONINI, MYOGLOBIN in the last 168 hours.  Invalid input(s): CK ------------------------------------------------------------------------------------------------------------------ Invalid input(s): POCBNP    Hartman Minahan D.O. on 05/23/2014 at 4:03 PM  Between 7am to 7pm - Pager - [phone]  After 7pm go to [email or web address] - password [email or web address]  And look for the night coverage person covering for me after hours  [facility] Office  [phone]

## 2014-05-23 NOTE — ED Notes (Signed)
Patient ambulatory to restroom without assistance. 

## 2014-05-23 NOTE — Progress Notes (Signed)
Radiation Oncology         [phone] ________________________________  Name: [name] MRN: [id number]  Date: 05/23/2014  DOB: 1955/05/03  Hospital Note   Diagnosis:   Recurrent endometrial cancer  Narrative:  The patient was seen in our clinic late yesterday afternoon with worsening pain complaints. She agreed to present to the emergency room for further evaluation. Events noted in the chart. CT scan shows progressive disease in the pelvis with possible fistula along the rectosigmoid colon and possible bladder involvement. Patient was seen by Dr. [name] earlier today and after a lengthy discussion patient would be willing to proceed with palliative radiation therapy to help with pain control issues.  the only way this could be accomplished is keeping the patient in the hospital.  Dr. Ree Kida has graciously agreed to keep the patient on the hospitalist service so that 10 day course of palliative radiation therapy can be completed.  This evening the patient's radiation plan was recalculated so that she can proceed with higher dose per day (3 gray per fraction) She will brought down to radiation oncology approximately 11 AM on Friday morning for her first treatment.  The patient and her mother are agreeable with this treatment.                         Lab Findings: Lab Results  Component Value Date   WBC 12.6* 05/23/2014   HGB 9.7* 05/23/2014   HCT 29.4* 05/23/2014   MCV 85.7 05/23/2014   PLT 206 05/23/2014    Radiographic Findings: Ct Abdomen Pelvis W Contrast  05/22/2014   CLINICAL DATA:  Endometrial cancer, nausea, pelvic drainage  EXAM: CT ABDOMEN AND PELVIS WITH CONTRAST  TECHNIQUE: Multidetector CT imaging of the abdomen and pelvis was performed using the standard protocol following bolus administration of intravenous contrast.  CONTRAST:  127mL OMNIPAQUE IOHEXOL 300 MG/ML SOLN, 73mL OMNIPAQUE IOHEXOL 300 MG/ML SOLN  COMPARISON:  03/06/2014  FINDINGS: Lower chest:  Lung  bases are clear.  Hepatobiliary: Liver is within normal limits.  Gallbladder is unremarkable. No intrahepatic or extrahepatic ductal dilatation.  Pancreas: Within normal limits.  Spleen: Splenomegaly, measuring 15.5 cm in maximal dimension.  Adrenals/Urinary Tract: Adrenal glands are within normal limits.  Left kidney is within normal limits.  Malrotated right kidney. Moderate hydroureteronephrosis, likely secondary to extrinsic compression.  Thick-walled bladder.  Stomach/Bowel: Stomach is within normal limits.  No evidence of bowel obstruction.  Appendix is not discretely visualized and may be surgically absent.  Moderate colonic stool burden, suggesting constipation.  Sigmoid colon is thick-walled and likely involved via the pelvic mass (described below).  Mass like thickening involving the rectum (series 2/image 57), new.  Vascular/Lymphatic: No evidence of abdominal aortic aneurysm.  Small subcentimeter retroperitoneal nodes, indeterminate.  Scattered nodes along the sigmoid mesocolon measuring up to 10 mm (series 2/image 52), along with bilateral internal iliac nodes measuring 19-20 mm (series 2/image 57), compatible with nodal metastases.  Reproductive: Status post hysterectomy.  7.4 x 5.7 x 6.1 cm necrotic pelvic mass (series 2/image 62), likely arising from the vaginal cuff, previously 3.2 x 5.6 cm. Gas within the lesion suggest direct communication with the sigmoid colon/ rectum. Additionally, the mass abuts the posterior wall of the bladder, which is also likely involved.  Other: No abdominopelvic ascites.  Musculoskeletal: Degenerative changes of the thoracolumbar spine, most prominent at L5-S1.  IMPRESSION: 7.4 cm necrotic pelvic mass arising from the vaginal cuff, increased.  Suspected involvement/fistulous communication  with the rectosigmoid colon. Suspected involvement of the bladder.  Associated pelvic nodal metastases.  Moderate right hydroureteronephrosis, likely reflecting extrinsic compression  by tumor.   Electronically Signed   By: Julian Hy M.D.   On: 05/22/2014 21:59     __________________________________ Blair Promise, MD

## 2014-05-24 ENCOUNTER — Ambulatory Visit
Admit: 2014-05-24 | Discharge: 2014-05-24 | Disposition: A | Discharge status: Home / Self Care | Payer: Medicaid Other | Attending: Radiation Oncology | Admitting: Radiation Oncology

## 2014-05-24 DIAGNOSIS — Z7189 Other specified counseling: Secondary | ICD-10-CM

## 2014-05-24 DIAGNOSIS — Z515 Encounter for palliative care: Secondary | ICD-10-CM

## 2014-05-24 LAB — CBC
HCT: 29.6 % — ABNORMAL LOW (ref 36.0–46.0)
Hemoglobin: 9.8 g/dL — ABNORMAL LOW (ref 12.0–15.0)
MCH: 28.7 pg (ref 26.0–34.0)
MCHC: 33.1 g/dL (ref 30.0–36.0)
MCV: 86.5 fL (ref 78.0–100.0)
PLATELETS: 215 10*3/uL (ref 150–400)
RBC: 3.42 MIL/uL — ABNORMAL LOW (ref 3.87–5.11)
RDW: 13 % (ref 11.5–15.5)
WBC: 13.3 10*3/uL — ABNORMAL HIGH (ref 4.0–10.5)

## 2014-05-24 LAB — BASIC METABOLIC PANEL
Anion gap: 11 (ref 5–15)
BUN: 10 mg/dL (ref 6–23)
CHLORIDE: 103 mmol/L (ref 96–112)
CO2: 23 mmol/L (ref 19–32)
CREATININE: 1.13 mg/dL — AB (ref 0.50–1.10)
Calcium: 8.7 mg/dL (ref 8.4–10.5)
GFR calc non Af Amer: 52 mL/min — ABNORMAL LOW (ref >90)
GFR, EST AFRICAN AMERICAN: 60 mL/min — AB (ref >90)
Glucose, Bld: 93 mg/dL (ref 70–99)
POTASSIUM: 3.6 mmol/L (ref 3.5–5.1)
SODIUM: 137 mmol/L (ref 135–145)

## 2014-05-24 MED ORDER — HYDROMORPHONE HCL 2 MG PO TABS
2.0000 mg | ORAL_TABLET | ORAL | Status: DC | PRN
  Administered 2014-05-25 – 2014-06-06 (×15): 2 mg via ORAL
  Filled 2014-05-24 (×16): qty 1

## 2014-05-24 MED ORDER — HYDROMORPHONE HCL 1 MG/ML IJ SOLN
1.0000 mg | INTRAMUSCULAR | Status: DC | PRN
  Administered 2014-05-24 – 2014-06-05 (×100): 1 mg via INTRAVENOUS
  Filled 2014-05-24 (×100): qty 1

## 2014-05-24 MED ORDER — HYDROMORPHONE HCL 1 MG/ML IJ SOLN
1.0000 mg | INTRAMUSCULAR | Status: AC
  Administered 2014-05-24: 1 mg via INTRAVENOUS
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
## 2014-05-24 NOTE — Progress Notes (Signed)
[facility]                                                                              Patient Demographics  [name], is a 59 y.o. female, DOB - 04-Dec-1955, GTX:[id number]  Admit date - 05/22/2014   Admitting Physician [name], MD  Outpatient Primary MD for the patient is [name], FNP  LOS - 1   Chief Complaint  Patient presents with  . Abdominal Pain  . Nausea  . Emesis  . Constipation      HPI on 05/22/2014 by Dr. [name] Patient with complex medical hx. extensive history of noncompliance and drug seeking behavior and polysubstance abuse. 2013 patient was found to have early stage endometrial cancer. 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 Patient did not follow up with gyn oncology.  In 2015 she presented with vaginal bleeding, pelvic pain and discharge. 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  CT at that time showed new lobulated soft tissue mass involving vaginal cuff and posterior wall of the urinary bladder, with areas of internal necrosis, measuring 3.7 x 6.9 cm; there was also new pelvic adenopathy at iliac bifurcations bilaterally, but no obvious disease outside of pelvis, including nothing involving bowels, and no hydronephrosis. Patient had an admission for diverticulitis in October 2015 and ended time was able to be seen in-house by radiation oncology and oncology which point palliative radiation therapy has been started. Patient has been going to her treatments intermittently. She has completed only about 1.5 [name] has 6 treatments. Radiation oncologist started her on Dilaudid 4 mg as needed for pain. She is supposed to follow-up with pain clinic but canceled her appointment. Patient continued to  have worsening abdominal pain but started to have stabbing pain in her vaginal and rectal area. She have had frequent urination in seen pus from her rectal area. She has been intermittently tearful. Reports low-grade fevers. Today she was seen again by radiation oncology. Who has discussed potential hospice or palliative care evaluation. Patient was recommended to present to emergency department so that the father workup could be done. Patient currently resides with her 50 year old mother. Scan today showed progression of her tumor burden 7 .4 cm necrotic pelvic mass arising from the vaginal cuff, Increased. 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 Suspected involvement of the bladder. Moderate right hydroureteronephrosis, likely reflecting extrinsic compression by tumor. Unfortunately patient and the family have very poor insight. Patient has been verbally abusive to the staff and her family. At this point she is concentrated on pain management. Neither patient nor her family at this point is ready to have a conversation about her prognosis stating that "she'll be just fine". Unable to finish interview due to patient becoming agitated and screaming out.  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 UA showed evidence of UTI/ pateint with elevated WBC. Transiently tachycardic. NO nausea or vomiting. She refuses trial of PO pain medications and threatens to leave AMA.  Assessment & Plan   Sepsis secondary to UTI -Upon admission, patient was febrile, tachycardic, with leukocytosis -UA: WBC TNTC, moderate leukocytes, many bacteria -pending urine and blood culture -Continue zosyn, IV fluids  Metastatic Endometrial cancer with hydronephrosis/ Cancer associated pain -CT abdomen and pelvis shows 7.4 similar necrotic pelvic mass arising in the vaginal cuff, increased. Suspected fistula communication with  the rectosigmoid colon, involvement of bladder, dural metastasis, moderate right hydronephrosis  -Oncology, radiation oncology, clinical electrical oncology, urology consulted and appreciated  -Palliative care consult and appreciated  -urology did not recommend tube as patient has been noncompliant in the past, also renal function is normal -Will monitor intake/output -Seems that patient was noncompliant with her radiation treatments as well as office visits  -Continue pain control with IV and PO Dilaudid -Spoke with Dr. [name] (GynOnc) who spoke with Dr. [name] (RadOnc) and recommended patient have 10 days of palliative radiation during hospitalization as she supposedly could not make it to her appointments.  Essential hypertension -Currently stable, on no home medications -Will continue to monitor  Hypothyroidism -Continue Synthroid  Anxiety/Depression/Psychosis -Continue Ativan, sertraline, thiothixene  Asthma -Currently stable, continue albuterol PRN  Code Status: Full  Family Communication: Mother at bedside  Disposition Plan: Admitted, patient will start radiation today  Time Spent in minutes   30 minutes  Procedures  Palliative Radiation   Consults   Oncology Radiation oncology [facility], via phone Palliative care  DVT Prophylaxis  lovenox  Lab Results  Component Value Date   PLT 215 05/24/2014    Medications  Scheduled Meds: . docusate sodium  100 mg Oral BID  . enoxaparin (LOVENOX) injection  40 mg Subcutaneous Q24H  . levothyroxine  125 mcg Oral QAC breakfast  . LORazepam  1 mg Oral BID  . piperacillin-tazobactam (ZOSYN)  IV  3.375 g Intravenous Q8H  . sertraline  200 mg Oral QPM  . thiothixene  2 mg Oral QHS   Continuous Infusions:  PRN Meds:.acetaminophen **OR** acetaminophen, albuterol, HYDROcodone-acetaminophen, HYDROmorphone (DILAUDID) injection, HYDROmorphone, ondansetron **OR** ondansetron (ZOFRAN) IV  Antibiotics     Anti-infectives    Start     Dose/Rate Route Frequency Ordered Stop   05/23/14 0830  piperacillin-tazobactam (ZOSYN) IVPB 3.375 g     3.375 g 12.5 mL/hr over 240 Minutes Intravenous Every 8 hours 05/23/14 0804     05/22/14 2215  cefTRIAXone (ROCEPHIN) 1 g in dextrose 5 % 50 mL IVPB     1 g 100 mL/hr over 30 Minutes Intravenous  Once 05/22/14 2209 05/22/14 2342        Subjective:   Caren Griffins Virani seen and examined today.  Patient currently resting comfortably.  She states she understands that she needs to stay in the hospital for 10 days to receive palliative radiation.  She denies pain at this time.  Denies chest pain, shortness of breath, abdominal pain.   Objective:   Filed Vitals:   05/23/14 0948 05/23/14 2159 05/24/14 0628 05/24/14 1100  BP: 103/62 94/56 101/67   Pulse: 72 81 71   Temp: 98.3 F (36.8 C) 98.9 F (37.2 C) 98.7 F (37.1 C)   TempSrc: Oral Oral Oral   Resp: 16 16 16    Height:    4\' 10"  (1.473 m)  Weight:    75.297 kg (166 lb)  SpO2: 97% 98% 97%     Wt Readings from Last 3 Encounters:  05/24/14 75.297 kg (166 lb)  01/25/14 75.569 kg (166 lb 9.6 oz)  01/06/14 79.243 kg (174  lb 11.2 oz)     Intake/Output Summary (Last 24 hours) at 05/24/14 1133 Last data filed at 05/24/14 0002  Gross per 24 hour  Intake    300 ml  Output      0 ml  Net    300 ml    Exam  General: Well developed, NAD  HEENT: NCAT, mucous membranes moist  Cardiovascular: S1 S2 auscultated, RRR  Respiratory: Clear to auscultation bilaterally with equal chest rise  Abdomen: Soft, nontender, nondistended, + bowel sounds  Extremities: warm dry without cyanosis clubbing or edema  Neuro: AAOx3, nonfocal  Data Review   Micro Results Recent Results (from the past 240 hour(s))  MRSA PCR Screening     Status: None   Collection Time: 05/23/14  2:55 AM  Result Value Ref Range Status   MRSA by PCR NEGATIVE NEGATIVE Final    Comment:        The GeneXpert MRSA Assay  (FDA approved for NASAL specimens only), is one component of a comprehensive MRSA colonization surveillance program. It is not intended to diagnose MRSA infection nor to guide or monitor treatment for MRSA infections.     Radiology Reports Ct Abdomen Pelvis W Contrast  05/22/2014   CLINICAL DATA:  Endometrial cancer, nausea, pelvic drainage  EXAM: CT ABDOMEN AND PELVIS WITH CONTRAST  TECHNIQUE: Multidetector CT imaging of the abdomen and pelvis was performed using the standard protocol following bolus administration of intravenous contrast.  CONTRAST:  150mL OMNIPAQUE IOHEXOL 300 MG/ML SOLN, 43mL OMNIPAQUE IOHEXOL 300 MG/ML SOLN  COMPARISON:  03/06/2014  FINDINGS: Lower chest:  Lung bases are clear.  Hepatobiliary: Liver is within normal limits.  Gallbladder is unremarkable. No intrahepatic or extrahepatic ductal dilatation.  Pancreas: Within normal limits.  Spleen: Splenomegaly, measuring 15.5 cm in maximal dimension.  Adrenals/Urinary Tract: Adrenal glands are within normal limits.  Left kidney is within normal limits.  Malrotated right kidney. Moderate hydroureteronephrosis, likely secondary to extrinsic compression.  Thick-walled bladder.  Stomach/Bowel: Stomach is within normal limits.  No evidence of bowel obstruction.  Appendix is not discretely visualized and may be surgically absent.  Moderate colonic stool burden, suggesting constipation.  Sigmoid colon is thick-walled and likely involved via the pelvic mass (described below).  Mass like thickening involving the rectum (series 2/image 57), new.  Vascular/Lymphatic: No evidence of abdominal aortic aneurysm.  Small subcentimeter retroperitoneal nodes, indeterminate.  Scattered nodes along the sigmoid mesocolon measuring up to 10 mm (series 2/image 52), along with bilateral internal iliac nodes measuring 19-20 mm (series 2/image 57), compatible with nodal metastases.  Reproductive: Status post hysterectomy.  7.4 x 5.7 x 6.1 cm necrotic pelvic  mass (series 2/image 62), likely arising from the vaginal cuff, previously 3.2 x 5.6 cm. Gas within the lesion suggest direct communication with the sigmoid colon/ rectum. Additionally, the mass abuts the posterior wall of the bladder, which is also likely involved.  Other: No abdominopelvic ascites.  Musculoskeletal: Degenerative changes of the thoracolumbar spine, most prominent at L5-S1.  IMPRESSION: 7.4 cm necrotic pelvic mass arising from the vaginal cuff, increased.  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 Suspected involvement of the bladder.  Associated pelvic nodal metastases.  Moderate right hydroureteronephrosis, likely reflecting extrinsic compression by tumor.   Electronically Signed   By: Julian Hy M.D.   On: 05/22/2014 21:59    CBC  Recent Labs Lab 05/22/14 1823 05/23/14 0605 05/24/14 0530  WBC 13.4* 12.6* 13.3*  HGB 10.5* 9.7* 9.8*  HCT 32.5* 29.4* 29.6*  PLT 220 206 215  MCV 86.4 85.7 86.5  MCH 27.9 28.3 28.7  MCHC 32.3 33.0 33.1  RDW 12.9 12.9 13.0  LYMPHSABS 1.4  --   --   MONOABS 1.2*  --   --   EOSABS 0.5  --   --   BASOSABS 0.0  --   --     Chemistries   Recent Labs Lab 05/22/14 1823 05/23/14 0605 05/24/14 0530  NA 136 131* 137  K 4.0 3.4* 3.6  CL 103 101 103  CO2 24 22 23   GLUCOSE 107* 96 93  BUN 11 10 10   CREATININE 1.03 0.99 1.13*  CALCIUM 9.0 8.3* 8.7  MG  --  2.0  --   AST 19 20  --   ALT 15 13  --   ALKPHOS 61 57  --   BILITOT 0.5 0.6  --    ------------------------------------------------------------------------------------------------------------------ estimated creatinine clearance is 46.3 mL/min (by C-G formula based on Cr of 1.13). ------------------------------------------------------------------------------------------------------------------ No results for input(s): HGBA1C in the last 72  hours. ------------------------------------------------------------------------------------------------------------------ No results for input(s): CHOL, HDL, LDLCALC, TRIG, CHOLHDL, LDLDIRECT in the last 72 hours. ------------------------------------------------------------------------------------------------------------------  Recent Labs  05/23/14 0505  TSH 22.742*   ------------------------------------------------------------------------------------------------------------------ No results for input(s): VITAMINB12, FOLATE, FERRITIN, TIBC, IRON, RETICCTPCT in the last 72 hours.  Coagulation profile No results for input(s): INR, PROTIME in the last 168 hours.  No results for input(s): DDIMER in the last 72 hours.  Cardiac Enzymes No results for input(s): CKMB, TROPONINI, MYOGLOBIN in the last 168 hours.  Invalid input(s): CK ------------------------------------------------------------------------------------------------------------------ Invalid input(s): POCBNP    Delois Tolbert D.O. on 05/24/2014 at 11:33 AM  Between 7am to 7pm - Pager - [phone]  After 7pm go to [email or web address] - password [email or web address]  And look for the night coverage person covering for me after hours  [facility] Office  [phone]

## 2014-05-24 NOTE — Progress Notes (Signed)
[facility] Therapy Treatment Record Phone [phone]   Radiation Therapy was administered to [name] on: 05/24/2014  11:44 AM and was treatment # 1 out of a planned course of 10 treatments.

## 2014-05-24 NOTE — Progress Notes (Signed)
Clinical Social Work Department BRIEF PSYCHOSOCIAL ASSESSMENT 05/24/2014  Patient:  [name], [name]     Account Number:  [id number]     Admit date:  05/22/2014  Clinical Social Worker:  [name]  Date/Time:  05/24/2014 04:30 PM  Referred by:  Physician  Date Referred:  05/24/2014 Referred for  Other - See comment   Other Referral:   Interview type:  Other - See comment Other interview type:   chart review and discussion with unit staff    PSYCHOSOCIAL DATA Living Status:  PARENTS Admitted from facility:   Level of care:   Primary support name:  [name]/[phone] Primary support relationship to patient:  PARENT Degree of support available:   adequate    CURRENT CONCERNS Current Concerns  Other - See comment   Other Concerns:   CSW received notification from unit director of concern about pt mother being pt primary caregiver upon discharge as pt mother is 36 yo and had fall in pt room yesterday evening.    SOCIAL WORK ASSESSMENT / PLAN CSW received notification from unit director of concern about pt mother being pt primary caregiver upon discharge as pt mother is 55 yo and had fall in pt room yesterday evening.    CSW spoke with [name], PMT NP who met with pt and pt mother this morning. CSW shared the information regarding pt mother fall last night and concern surround discharge plan when pt medically stable. PMT NP, [name] agreeable to contact Adult Protective Services with this CSW in order to explore potential report being made. [name], PMT NP contacted APS with CSW present and spoke to [place] caseworker who stated that pt had previous open case with APS, but case has since been closed. [name], PMT NP expressed the concerns to APS surrounding pt mother and APS discussed that if pt mother remains staying at pt bedside then pt mother is considered in a safe environment and would not warrant an investigation, but as soon as pt or pt  mother return home then APS report warranted as there are concerns about pt and pt mother's abilities to care forthemselves and each other in their home.    CSW met with unit director, [name] and unit assistant director, [name] to discuss discussion with APS. Per unit director, pt mother is appropriate to stay at pt bedside at this time, but if pt mother begins to require assistance or has continued falls then situation will have to be readdressed. Unit director's main concern is discharge plan and CSW discussed that CSW will notify APS upon pt discharge in order for APS to assess pt and pt mother in their home environment.    CSW to continue to follow to provide support.   Assessment/plan status:  Psychosocial Support/Ongoing Assessment of Needs Other assessment/ plan:   Information/referral to community resources:   Pt will warrant referral to Adult [employer] upon discharge given concerns about pt ability to care for herself and pt mother ability to care for herself and their ability to care for each other in their home environment.    PATIENT'S/FAMILY'S RESPONSE TO PLAN OF CARE: Pt was out of room in radiation when CSW visited pt room. Appreciate assistance from [name], PMT NP in contacting APS as she is more familiar with pt situation. APS does not feel referral appropriate at this time, but referral will need to be sent to APS upon pt discharge.    Plan is to monitor situation regarding  pt mother being in the room during hospitalization and assess further needs if they arise while pt remains inpatient.    [name], MSW, [place] [facility] [phone]

## 2014-05-25 LAB — CBC
HEMATOCRIT: 29.2 % — AB (ref 36.0–46.0)
Hemoglobin: 9.5 g/dL — ABNORMAL LOW (ref 12.0–15.0)
MCH: 28.3 pg (ref 26.0–34.0)
MCHC: 32.5 g/dL (ref 30.0–36.0)
MCV: 86.9 fL (ref 78.0–100.0)
Platelets: 216 10*3/uL (ref 150–400)
RBC: 3.36 MIL/uL — AB (ref 3.87–5.11)
RDW: 13 % (ref 11.5–15.5)
WBC: 10.6 10*3/uL — ABNORMAL HIGH (ref 4.0–10.5)

## 2014-05-25 LAB — BASIC METABOLIC PANEL
ANION GAP: 8 (ref 5–15)
BUN: 10 mg/dL (ref 6–23)
CALCIUM: 8.3 mg/dL — AB (ref 8.4–10.5)
CHLORIDE: 106 mmol/L (ref 96–112)
CO2: 25 mmol/L (ref 19–32)
Creatinine, Ser: 0.98 mg/dL (ref 0.50–1.10)
GFR calc non Af Amer: 62 mL/min — ABNORMAL LOW (ref >90)
GFR, EST AFRICAN AMERICAN: 72 mL/min — AB (ref >90)
Glucose, Bld: 96 mg/dL (ref 70–99)
Potassium: 3.4 mmol/L — ABNORMAL LOW (ref 3.5–5.1)
SODIUM: 139 mmol/L (ref 135–145)

## 2014-05-25 MED ORDER — PIPERACILLIN-TAZOBACTAM 3.375 G IVPB 30 MIN: 3.3750 g | Freq: Three times a day (TID) | INTRAVENOUS | Status: DC

## 2014-05-25 MED ORDER — POTASSIUM CHLORIDE CRYS ER 20 MEQ PO TBCR
40.0000 meq | EXTENDED_RELEASE_TABLET | Freq: Once | ORAL | Status: DC
  Filled 2014-05-25: qty 2

## 2014-05-25 NOTE — Progress Notes (Signed)
[facility]                                                                              Patient Demographics  [name], is a 59 y.o. female, DOB - 08/13/1955, TIW:[id number]  Admit date - 05/22/2014   Admitting Physician [name], MD  Outpatient Primary MD for the patient is [name], FNP  LOS - 2   Chief Complaint  Patient presents with  . Abdominal Pain  . Nausea  . Emesis  . Constipation      HPI on 05/22/2014 by Dr. [name] Patient with complex medical hx. extensive history of noncompliance and drug seeking behavior and polysubstance abuse. 2013 patient was found to have early stage endometrial cancer. 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 Patient did not follow up with gyn oncology.  In 2015 she presented with vaginal bleeding, pelvic pain and discharge. 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  CT at that time showed new lobulated soft tissue mass involving vaginal cuff and posterior wall of the urinary bladder, with areas of internal necrosis, measuring 3.7 x 6.9 cm; there was also new pelvic adenopathy at iliac bifurcations bilaterally, but no obvious disease outside of pelvis, including nothing involving bowels, and no hydronephrosis. Patient had an admission for diverticulitis in October 2015 and ended time was able to be seen in-house by radiation oncology and oncology which point palliative radiation therapy has been started. Patient has been going to her treatments intermittently. She has completed only about 1.5 [name] has 6 treatments. Radiation oncologist started her on Dilaudid 4 mg as needed for pain. She is supposed to follow-up with pain clinic but canceled her appointment. Patient continued to  have worsening abdominal pain but started to have stabbing pain in her vaginal and rectal area. She have had frequent urination in seen pus from her rectal area. She has been intermittently tearful. Reports low-grade fevers. Today she was seen again by radiation oncology. Who has discussed potential hospice or palliative care evaluation. Patient was recommended to present to emergency department so that the father workup could be done. Patient currently resides with her 24 year old mother. Scan today showed progression of her tumor burden 7 .4 cm necrotic pelvic mass arising from the vaginal cuff, Increased. 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 Suspected involvement of the bladder. Moderate right hydroureteronephrosis, likely reflecting extrinsic compression by tumor. Unfortunately patient and the family have very poor insight. Patient has been verbally abusive to the staff and her family. At this point she is concentrated on pain management. Neither patient nor her family at this point is ready to have a conversation about her prognosis stating that "she'll be just fine". Unable to finish interview due to patient becoming agitated and screaming out.  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 UA showed evidence of UTI/ pateint with elevated WBC. Transiently tachycardic. NO nausea or vomiting. She refuses trial of PO pain medications and threatens to leave AMA.  Assessment & Plan   Sepsis secondary to UTI -Upon admission, patient was febrile, tachycardic, with leukocytosis -UA: WBC TNTC, moderate leukocytes, many bacteria -Urine culture not found -blood cultures show no growth to date -Continue zosyn, IV fluids  Metastatic Endometrial cancer with hydronephrosis/ Cancer associated pain -CT abdomen and pelvis shows 7.4 similar necrotic pelvic mass arising in the vaginal cuff, increased.  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 [name] (GynOnc) who spoke with Dr. [name] (RadOnc) and recommended patient have 10 days of palliative radiation during hospitalization as she supposedly could not make it to her appointments. -Patient has a very unfortunate social situation.  Social work consulted and assisting. -Palliative care consult and appreciated- changed her medications to Dialudid IV 1mg  q2h and PO 2mg  q3h PRN -Patient would like IV dialudid 2mg  q2hours, explained to patient that is not appropriate as there are many side affects.  Patient appears comfortable at time of examination and falls asleep easily.   Essential hypertension -Currently stable, on no home medications -Will continue to monitor  Hypothyroidism -Continue Synthroid  Anxiety/Depression/Psychosis -Continue Ativan, sertraline, thiothixene  Asthma -Currently stable, continue albuterol PRN  Code Status: Full  Family Communication: Mother at bedside  Disposition Plan: Admitted, Continue palliative radiation- 9 more treatments  Time Spent in minutes   30 minutes  Procedures  Palliative Radiation   Consults   Oncology Radiation oncology [facility], via phone Palliative care  DVT Prophylaxis  lovenox  Lab Results  Component Value Date   PLT 216 05/25/2014    Medications  Scheduled Meds: . docusate sodium  100 mg Oral BID  . enoxaparin (LOVENOX) injection  40 mg Subcutaneous Q24H  . levothyroxine  125 mcg Oral  QAC breakfast  . LORazepam  1 mg Oral BID  . piperacillin-tazobactam (ZOSYN)  IV  3.375 g Intravenous Q8H  . sertraline  200 mg Oral QPM  . thiothixene  2 mg Oral QHS   Continuous Infusions:  PRN Meds:.acetaminophen **OR** acetaminophen, albuterol, HYDROcodone-acetaminophen, HYDROmorphone (DILAUDID) injection, HYDROmorphone, ondansetron **OR** ondansetron (ZOFRAN) IV  Antibiotics    Anti-infectives    Start     Dose/Rate Route Frequency Ordered Stop   05/23/14 0830  piperacillin-tazobactam (ZOSYN) IVPB 3.375 g     3.375 g 12.5 mL/hr over 240 Minutes Intravenous Every 8 hours 05/23/14 0804     05/22/14 2215  cefTRIAXone (ROCEPHIN) 1 g in dextrose 5 % 50 mL IVPB     1 g 100 mL/hr over 30 Minutes Intravenous  Once 05/22/14 2209 05/22/14 2342        Subjective:   [name] seen and examined today.  Patient currently sleeping, arousable.  Denies pain, but still wants her dilaudid dose increased.    Objective:   Filed Vitals:   05/24/14 1100 05/24/14 1501 05/24/14 2049 05/25/14 0528  BP:  104/61 125/78 109/66  Pulse:  76 86 70  Temp:  98.9 F (37.2 C) 98.5 F (36.9 C) 97.9 F (36.6 C)  TempSrc:  Oral Oral Oral  Resp:  16 16 16   Height: 4\' 10"  (1.473 m)     Weight: 75.297  kg (166 lb)     SpO2:  95% 97% 97%    Wt Readings from Last 3 Encounters:  05/24/14 75.297 kg (166 lb)  01/25/14 75.569 kg (166 lb 9.6 oz)  01/06/14 79.243 kg (174 lb 11.2 oz)     Intake/Output Summary (Last 24 hours) at 05/25/14 1123 Last data filed at 05/25/14 2774  Gross per 24 hour  Intake    400 ml  Output      0 ml  Net    400 ml    Exam  General: Well developed, NAD  Cardiovascular: S1 S2 auscultated, RRR  Respiratory: Clear to auscultation bilaterally with equal chest rise  Abdomen: Soft, nontender, nondistended, + bowel sounds  Extremities: warm dry without cyanosis clubbing or edema  Data Review   Micro Results Recent Results (from the past 240 hour(s))  MRSA PCR  Screening     Status: None   Collection Time: 05/23/14  2:55 AM  Result Value Ref Range Status   MRSA by PCR NEGATIVE NEGATIVE Final    Comment:        The GeneXpert MRSA Assay (FDA approved for NASAL specimens only), is one component of a comprehensive MRSA colonization surveillance program. It is not intended to diagnose MRSA infection nor to guide or monitor treatment for MRSA infections.   Culture, blood (routine x 2)     Status: None (Preliminary result)   Collection Time: 05/23/14 11:05 PM  Result Value Ref Range Status   Specimen Description BLOOD RIGHT ARM  Final   Special Requests BOTTLES DRAWN AEROBIC ONLY 10CC  Final   Culture   Final           BLOOD CULTURE RECEIVED NO GROWTH TO DATE CULTURE WILL BE HELD FOR 5 DAYS BEFORE ISSUING A FINAL NEGATIVE REPORT Performed at [facility]    Report Status PENDING  Incomplete  Culture, blood (routine x 2)     Status: None (Preliminary result)   Collection Time: 05/23/14 11:10 PM  Result Value Ref Range Status   Specimen Description BLOOD RIGHT HAND  Final   Special Requests BOTTLES DRAWN AEROBIC ONLY [place]  Final   Culture   Final           BLOOD CULTURE RECEIVED NO GROWTH TO DATE CULTURE WILL BE HELD FOR 5 DAYS BEFORE ISSUING A FINAL NEGATIVE REPORT Performed at [facility]    Report Status PENDING  Incomplete    Radiology Reports Ct Abdomen Pelvis W Contrast  05/22/2014   CLINICAL DATA:  Endometrial cancer, nausea, pelvic drainage  EXAM: CT ABDOMEN AND PELVIS WITH CONTRAST  TECHNIQUE: Multidetector CT imaging of the abdomen and pelvis was performed using the standard protocol following bolus administration of intravenous contrast.  CONTRAST:  155mL OMNIPAQUE IOHEXOL 300 MG/ML SOLN, 14mL OMNIPAQUE IOHEXOL 300 MG/ML SOLN  COMPARISON:  03/06/2014  FINDINGS: Lower chest:  Lung bases are clear.  Hepatobiliary: Liver is within normal limits.  Gallbladder is unremarkable. No intrahepatic or extrahepatic ductal  dilatation.  Pancreas: Within normal limits.  Spleen: Splenomegaly, measuring 15.5 cm in maximal dimension.  Adrenals/Urinary Tract: Adrenal glands are within normal limits.  Left kidney is within normal limits.  Malrotated right kidney. Moderate hydroureteronephrosis, likely secondary to extrinsic compression.  Thick-walled bladder.  Stomach/Bowel: Stomach is within normal limits.  No evidence of bowel obstruction.  Appendix is not discretely visualized and may be surgically absent.  Moderate colonic stool burden, suggesting constipation.  Sigmoid colon is thick-walled and likely involved  via the pelvic mass (described below).  Mass like thickening involving the rectum (series 2/image 57), new.  Vascular/Lymphatic: No evidence of abdominal aortic aneurysm.  Small subcentimeter retroperitoneal nodes, indeterminate.  Scattered nodes along the sigmoid mesocolon measuring up to 10 mm (series 2/image 52), along with bilateral internal iliac nodes measuring 19-20 mm (series 2/image 57), compatible with nodal metastases.  Reproductive: Status post hysterectomy.  7.4 x 5.7 x 6.1 cm necrotic pelvic mass (series 2/image 62), likely arising from the vaginal cuff, previously 3.2 x 5.6 cm. Gas within the lesion suggest direct communication with the sigmoid colon/ rectum. Additionally, the mass abuts the posterior wall of the bladder, which is also likely involved.  Other: No abdominopelvic ascites.  Musculoskeletal: Degenerative changes of the thoracolumbar spine, most prominent at L5-S1.  IMPRESSION: 7.4 cm necrotic pelvic mass arising from the vaginal cuff, increased.  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 Suspected involvement of the bladder.  Associated pelvic nodal metastases.  Moderate right hydroureteronephrosis, likely reflecting extrinsic compression by tumor.   Electronically Signed   By: Julian Hy M.D.   On: 05/22/2014 21:59    CBC  Recent Labs Lab 05/22/14 1823  05/23/14 0605 05/24/14 0530 05/25/14 0506  WBC 13.4* 12.6* 13.3* 10.6*  HGB 10.5* 9.7* 9.8* 9.5*  HCT 32.5* 29.4* 29.6* 29.2*  PLT 220 206 215 216  MCV 86.4 85.7 86.5 86.9  MCH 27.9 28.3 28.7 28.3  MCHC 32.3 33.0 33.1 32.5  RDW 12.9 12.9 13.0 13.0  LYMPHSABS 1.4  --   --   --   MONOABS 1.2*  --   --   --   EOSABS 0.5  --   --   --   BASOSABS 0.0  --   --   --     Chemistries   Recent Labs Lab 05/22/14 1823 05/23/14 0605 05/24/14 0530 05/25/14 0506  NA 136 131* 137 139  K 4.0 3.4* 3.6 3.4*  CL 103 101 103 106  CO2 24 22 23 25   GLUCOSE 107* 96 93 96  BUN 11 10 10 10   CREATININE 1.03 0.99 1.13* 0.98  CALCIUM 9.0 8.3* 8.7 8.3*  MG  --  2.0  --   --   AST 19 20  --   --   ALT 15 13  --   --   ALKPHOS 61 57  --   --   BILITOT 0.5 0.6  --   --    ------------------------------------------------------------------------------------------------------------------ estimated creatinine clearance is 53.4 mL/min (by C-G formula based on Cr of 0.98). ------------------------------------------------------------------------------------------------------------------ No results for input(s): HGBA1C in the last 72 hours. ------------------------------------------------------------------------------------------------------------------ No results for input(s): CHOL, HDL, LDLCALC, TRIG, CHOLHDL, LDLDIRECT in the last 72 hours. ------------------------------------------------------------------------------------------------------------------  Recent Labs  05/23/14 0505  TSH 22.742*   ------------------------------------------------------------------------------------------------------------------ No results for input(s): VITAMINB12, FOLATE, FERRITIN, TIBC, IRON, RETICCTPCT in the last 72 hours.  Coagulation profile No results for input(s): INR, PROTIME in the last 168 hours.  No results for input(s): DDIMER in the last 72 hours.  Cardiac Enzymes No results for input(s): CKMB,  TROPONINI, MYOGLOBIN in the last 168 hours.  Invalid input(s): CK ------------------------------------------------------------------------------------------------------------------ Invalid input(s): POCBNP    Karon Heckendorn D.O. on 05/25/2014 at 11:23 AM  Between 7am to 7pm - Pager - [phone]  After 7pm go to [email or web address] - password [email or web address]  And look for the night coverage person covering for me after hours  [facility] Office  [phone]

## 2014-05-26 NOTE — Progress Notes (Signed)
[facility]                                                                              Patient Demographics  [name], is a 59 y.o. female, DOB - 1956/02/23, UXL:[id number]  Admit date - 05/22/2014   Admitting Physician [name], MD  Outpatient Primary MD for the patient is [name], FNP  LOS - 3   Chief Complaint  Patient presents with  . Abdominal Pain  . Nausea  . Emesis  . Constipation      HPI on 05/22/2014 by Dr. [name] Patient with complex medical hx. extensive history of noncompliance and drug seeking behavior and polysubstance abuse. 2013 patient was found to have early stage endometrial cancer. 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 Patient did not follow up with gyn oncology.  In 2015 she presented with vaginal bleeding, pelvic pain and discharge. 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  CT at that time showed new lobulated soft tissue mass involving vaginal cuff and posterior wall of the urinary bladder, with areas of internal necrosis, measuring 3.7 x 6.9 cm; there was also new pelvic adenopathy at iliac bifurcations bilaterally, but no obvious disease outside of pelvis, including nothing involving bowels, and no hydronephrosis. Patient had an admission for diverticulitis in October 2015 and ended time was able to be seen in-house by radiation oncology and oncology which point palliative radiation therapy has been started. Patient has been going to her treatments intermittently. She has completed only about 1.5 [name] has 6 treatments. Radiation oncologist started her on Dilaudid 4 mg as needed for pain. She is supposed to follow-up with pain clinic but canceled her appointment. Patient continued to  have worsening abdominal pain but started to have stabbing pain in her vaginal and rectal area. She have had frequent urination in seen pus from her rectal area. She has been intermittently tearful. Reports low-grade fevers. Today she was seen again by radiation oncology. Who has discussed potential hospice or palliative care evaluation. Patient was recommended to present to emergency department so that the father workup could be done. Patient currently resides with her 1 year old mother. Scan today showed progression of her tumor burden 7 .4 cm necrotic pelvic mass arising from the vaginal cuff, Increased. 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 Suspected involvement of the bladder. Moderate right hydroureteronephrosis, likely reflecting extrinsic compression by tumor. Unfortunately patient and the family have very poor insight. Patient has been verbally abusive to the staff and her family. At this point she is concentrated on pain management. Neither patient nor her family at this point is ready to have a conversation about her prognosis stating that "she'll be just fine". Unable to finish interview due to patient becoming agitated and screaming out.  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 UA showed evidence of UTI/ pateint with elevated WBC. Transiently tachycardic. NO nausea or vomiting. She refuses trial of PO pain medications and threatens to leave AMA.  Assessment & Plan   Sepsis secondary to UTI -Upon admission, patient was febrile, tachycardic, with leukocytosis -Leukocytosis and vital signs have improved -UA: WBC TNTC, moderate leukocytes, many bacteria -Urine culture not found -blood cultures show no growth to date -Continue zosyn, IV fluids  Metastatic Endometrial cancer with hydronephrosis/ Cancer associated pain -CT abdomen and pelvis shows 7.4 similar necrotic pelvic mass  arising in the vaginal cuff, increased. 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 [name] (GynOnc) who spoke with Dr. [name] (RadOnc) and recommended patient have 10 days of palliative radiation during hospitalization as she supposedly could not make it to her appointments. -Patient has a very unfortunate social situation.  Social work consulted and assisting. -Palliative care consult and appreciated- changed her medications to Dialudid IV 1mg  q2h and PO 2mg  q3h PRN -Continuously asks for more IV pain medication.  There is no doubt that patient has pain, however at the time of examination, she appears comfortable and falls asleep easily.  Repeatedly explained to patient that there are too many side effects with increased pain medications, including decreased respiratory drive.   Essential hypertension -Currently stable, on no home medications -Will continue to monitor  Hypothyroidism -Continue Synthroid  Anxiety/Depression/Psychosis -Continue Ativan, sertraline, thiothixene  Asthma -Currently stable, continue albuterol PRN  Code Status: Full  Family Communication: Mother at bedside  Disposition Plan: Admitted, Continue palliative radiation- 9 more treatments which occur M-F  Time Spent in minutes   20 minutes  Procedures  Palliative Radiation   Consults   Oncology Radiation oncology [facility], via phone Palliative care  DVT Prophylaxis  lovenox  Lab Results  Component Value Date   PLT 216  05/25/2014    Medications  Scheduled Meds: . docusate sodium  100 mg Oral BID  . enoxaparin (LOVENOX) injection  40 mg Subcutaneous Q24H  . levothyroxine  125 mcg Oral QAC breakfast  . LORazepam  1 mg Oral BID  . piperacillin-tazobactam (ZOSYN)  IV  3.375 g Intravenous Q8H  . potassium chloride  40 mEq Oral Once  . sertraline  200 mg Oral QPM  . thiothixene  2 mg Oral QHS   Continuous Infusions:  PRN Meds:.acetaminophen **OR** acetaminophen, albuterol, HYDROcodone-acetaminophen, HYDROmorphone (DILAUDID) injection, HYDROmorphone, ondansetron **OR** ondansetron (ZOFRAN) IV  Antibiotics    Anti-infectives    Start     Dose/Rate Route Frequency Ordered Stop   05/25/14 1931  piperacillin-tazobactam (ZOSYN) IVPB 3.375 g  Status:  Discontinued     3.375 g 100 mL/hr over 30 Minutes Intravenous 3 times per day 05/25/14 1931 05/25/14 1935   05/23/14 0830  piperacillin-tazobactam (ZOSYN) IVPB 3.375 g     3.375 g 12.5 mL/hr over 240 Minutes Intravenous Every 8 hours 05/23/14 0804     05/22/14 2215  cefTRIAXone (ROCEPHIN) 1 g in dextrose 5 % 50 mL IVPB     1 g 100 mL/hr over 30 Minutes Intravenous  Once 05/22/14 2209 05/22/14 2342        Subjective:   Caren Griffins Rusconi seen and examined today.  Patient currently sleeping, arousable.  Denies pain, but still wants her dilaudid dose increased.  Denies chest pain, abdominal pain, SOB.  Objective:   Filed Vitals:   05/25/14 0528 05/25/14 1315 05/25/14 2134 05/26/14 0435  BP: 109/66 103/63 119/70 107/66  Pulse: 70 79 84 71  Temp: 97.9 F (36.6 C) 99.3 F (37.4 C) 97.8 F (36.6 C) 98.2 F (36.8 C)  TempSrc: Oral Oral Oral Oral  Resp: 16 20 16 16   Height:      Weight:      SpO2: 97% 96% 96% 98%    Wt Readings from Last 3 Encounters:  05/24/14 75.297 kg (166 lb)  01/25/14 75.569 kg (166 lb 9.6 oz)  01/06/14 79.243 kg (174 lb 11.2 oz)     Intake/Output Summary (Last 24 hours) at 05/26/14 1006 Last data filed at 05/26/14  0435  Gross per 24 hour  Intake   2010 ml  Output    300 ml  Net   1710 ml    Exam  General: Well developed, NAD  Cardiovascular: S1 S2 auscultated, RRR  Respiratory: Clear to auscultation bilaterally   Abdomen: Soft, nontender, nondistended, + bowel sounds   Data Review   Micro Results Recent Results (from the past 240 hour(s))  MRSA PCR Screening     Status: None   Collection Time: 05/23/14  2:55 AM  Result Value Ref Range Status   MRSA by PCR NEGATIVE NEGATIVE Final    Comment:        The GeneXpert MRSA Assay (FDA approved for NASAL specimens only), is one component of a comprehensive MRSA colonization surveillance program. It is not intended to diagnose MRSA infection nor to guide or monitor treatment for MRSA infections.   Culture, blood (routine x 2)     Status: None (Preliminary result)   Collection Time: 05/23/14 11:05 PM  Result Value Ref Range Status   Specimen Description BLOOD RIGHT ARM  Final   Special Requests BOTTLES DRAWN AEROBIC ONLY 10CC  Final   Culture   Final           BLOOD CULTURE RECEIVED NO GROWTH TO DATE CULTURE WILL BE HELD FOR 5 DAYS BEFORE ISSUING A FINAL NEGATIVE REPORT Performed at [facility]    Report Status PENDING  Incomplete  Culture, blood (routine x 2)     Status: None (Preliminary result)   Collection Time: 05/23/14 11:10 PM  Result Value Ref Range Status   Specimen Description BLOOD RIGHT HAND  Final   Special Requests BOTTLES DRAWN AEROBIC ONLY [place]  Final   Culture   Final           BLOOD CULTURE RECEIVED NO GROWTH TO DATE CULTURE WILL BE HELD FOR 5 DAYS BEFORE ISSUING A FINAL NEGATIVE REPORT Performed at [facility]    Report Status PENDING  Incomplete    Radiology Reports Ct Abdomen Pelvis W Contrast  05/22/2014   CLINICAL DATA:  Endometrial cancer, nausea, pelvic drainage  EXAM: CT ABDOMEN AND PELVIS WITH CONTRAST  TECHNIQUE: Multidetector CT imaging of the abdomen and pelvis was performed using  the standard protocol following bolus administration of intravenous contrast.  CONTRAST:  127mL OMNIPAQUE IOHEXOL 300 MG/ML SOLN, 40mL OMNIPAQUE IOHEXOL 300 MG/ML SOLN  COMPARISON:  03/06/2014  FINDINGS: Lower chest:  Lung bases are clear.  Hepatobiliary: Liver is within normal limits.  Gallbladder is unremarkable. No intrahepatic or extrahepatic ductal dilatation.  Pancreas: Within normal limits.  Spleen: Splenomegaly, measuring 15.5 cm in maximal dimension.  Adrenals/Urinary Tract: Adrenal glands are within normal limits.  Left kidney is within  normal limits.  Malrotated right kidney. Moderate hydroureteronephrosis, likely secondary to extrinsic compression.  Thick-walled bladder.  Stomach/Bowel: Stomach is within normal limits.  No evidence of bowel obstruction.  Appendix is not discretely visualized and may be surgically absent.  Moderate colonic stool burden, suggesting constipation.  Sigmoid colon is thick-walled and likely involved via the pelvic mass (described below).  Mass like thickening involving the rectum (series 2/image 57), new.  Vascular/Lymphatic: No evidence of abdominal aortic aneurysm.  Small subcentimeter retroperitoneal nodes, indeterminate.  Scattered nodes along the sigmoid mesocolon measuring up to 10 mm (series 2/image 52), along with bilateral internal iliac nodes measuring 19-20 mm (series 2/image 57), compatible with nodal metastases.  Reproductive: Status post hysterectomy.  7.4 x 5.7 x 6.1 cm necrotic pelvic mass (series 2/image 62), likely arising from the vaginal cuff, previously 3.2 x 5.6 cm. Gas within the lesion suggest direct communication with the sigmoid colon/ rectum. Additionally, the mass abuts the posterior wall of the bladder, which is also likely involved.  Other: No abdominopelvic ascites.  Musculoskeletal: Degenerative changes of the thoracolumbar spine, most prominent at L5-S1.  IMPRESSION: 7.4 cm necrotic pelvic mass arising from the vaginal cuff, increased.   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 Suspected involvement of the bladder.  Associated pelvic nodal metastases.  Moderate right hydroureteronephrosis, likely reflecting extrinsic compression by tumor.   Electronically Signed   By: Julian Hy M.D.   On: 05/22/2014 21:59    CBC  Recent Labs Lab 05/22/14 1823 05/23/14 0605 05/24/14 0530 05/25/14 0506  WBC 13.4* 12.6* 13.3* 10.6*  HGB 10.5* 9.7* 9.8* 9.5*  HCT 32.5* 29.4* 29.6* 29.2*  PLT 220 206 215 216  MCV 86.4 85.7 86.5 86.9  MCH 27.9 28.3 28.7 28.3  MCHC 32.3 33.0 33.1 32.5  RDW 12.9 12.9 13.0 13.0  LYMPHSABS 1.4  --   --   --   MONOABS 1.2*  --   --   --   EOSABS 0.5  --   --   --   BASOSABS 0.0  --   --   --     Chemistries   Recent Labs Lab 05/22/14 1823 05/23/14 0605 05/24/14 0530 05/25/14 0506  NA 136 131* 137 139  K 4.0 3.4* 3.6 3.4*  CL 103 101 103 106  CO2 24 22 23 25   GLUCOSE 107* 96 93 96  BUN 11 10 10 10   CREATININE 1.03 0.99 1.13* 0.98  CALCIUM 9.0 8.3* 8.7 8.3*  MG  --  2.0  --   --   AST 19 20  --   --   ALT 15 13  --   --   ALKPHOS 61 57  --   --   BILITOT 0.5 0.6  --   --    ------------------------------------------------------------------------------------------------------------------ estimated creatinine clearance is 53.4 mL/min (by C-G formula based on Cr of 0.98). ------------------------------------------------------------------------------------------------------------------ No results for input(s): HGBA1C in the last 72 hours. ------------------------------------------------------------------------------------------------------------------ No results for input(s): CHOL, HDL, LDLCALC, TRIG, CHOLHDL, LDLDIRECT in the last 72 hours. ------------------------------------------------------------------------------------------------------------------ No results for input(s): TSH, T4TOTAL, T3FREE, THYROIDAB in the last 72 hours.  Invalid input(s):  FREET3 ------------------------------------------------------------------------------------------------------------------ No results for input(s): VITAMINB12, FOLATE, FERRITIN, TIBC, IRON, RETICCTPCT in the last 72 hours.  Coagulation profile No results for input(s): INR, PROTIME in the last 168 hours.  No results for input(s): DDIMER in the last 72 hours.  Cardiac Enzymes No results for input(s): CKMB, TROPONINI, MYOGLOBIN in the last 168 hours.  Invalid input(s): CK ------------------------------------------------------------------------------------------------------------------  Invalid input(s): POCBNP    Keshauna Degraffenreid D.O. on 05/26/2014 at 10:06 AM  Between 7am to 7pm - Pager - [phone]  After 7pm go to [email or web address] - password [email or web address]  And look for the night coverage person covering for me after hours  [facility] Office  [phone]

## 2014-05-27 ENCOUNTER — Ambulatory Visit
Admit: 2014-05-27 | Discharge: 2014-05-27 | Disposition: A | Discharge status: Home / Self Care | Payer: Medicaid Other | Attending: Radiation Oncology | Admitting: Radiation Oncology

## 2014-05-27 DIAGNOSIS — Z7189 Other specified counseling: Secondary | ICD-10-CM | POA: Diagnosis present

## 2014-05-27 DIAGNOSIS — R1900 Intra-abdominal and pelvic swelling, mass and lump, unspecified site: Secondary | ICD-10-CM

## 2014-05-27 DIAGNOSIS — Z515 Encounter for palliative care: Secondary | ICD-10-CM | POA: Diagnosis present

## 2014-05-27 LAB — BASIC METABOLIC PANEL
Anion gap: 9 (ref 5–15)
BUN: 8 mg/dL (ref 6–23)
CHLORIDE: 104 mmol/L (ref 96–112)
CO2: 25 mmol/L (ref 19–32)
Calcium: 8.6 mg/dL (ref 8.4–10.5)
Creatinine, Ser: 0.94 mg/dL (ref 0.50–1.10)
GFR calc Af Amer: 75 mL/min — ABNORMAL LOW (ref >90)
GFR calc non Af Amer: 65 mL/min — ABNORMAL LOW (ref >90)
Glucose, Bld: 99 mg/dL (ref 70–99)
Potassium: 3 mmol/L — ABNORMAL LOW (ref 3.5–5.1)
SODIUM: 138 mmol/L (ref 135–145)

## 2014-05-27 LAB — CBC
HCT: 29.3 % — ABNORMAL LOW (ref 36.0–46.0)
HEMOGLOBIN: 9.5 g/dL — AB (ref 12.0–15.0)
MCH: 28.4 pg (ref 26.0–34.0)
MCHC: 32.4 g/dL (ref 30.0–36.0)
MCV: 87.5 fL (ref 78.0–100.0)
PLATELETS: 227 10*3/uL (ref 150–400)
RBC: 3.35 MIL/uL — ABNORMAL LOW (ref 3.87–5.11)
RDW: 13.3 % (ref 11.5–15.5)
WBC: 9.9 10*3/uL (ref 4.0–10.5)

## 2014-05-27 LAB — URINE CULTURE
CULTURE: NO GROWTH
Colony Count: NO GROWTH

## 2014-05-27 MED ORDER — POTASSIUM CHLORIDE CRYS ER 20 MEQ PO TBCR
40.0000 meq | EXTENDED_RELEASE_TABLET | Freq: Once | ORAL | Status: AC
  Administered 2014-05-27: 40 meq via ORAL
  Filled 2014-05-27: qty 2

## 2014-05-27 MED ORDER — SENNOSIDES-DOCUSATE SODIUM 8.6-50 MG PO TABS: 1.0000 | ORAL_TABLET | Freq: Every evening | ORAL | Status: DC | PRN

## 2014-05-27 MED ORDER — LEVOTHYROXINE SODIUM 137 MCG PO TABS
137.0000 ug | ORAL_TABLET | Freq: Every day | ORAL | Status: DC
  Administered 2014-05-28 – 2014-06-06 (×9): 137 ug via ORAL
  Filled 2014-05-27 (×12): qty 1

## 2014-05-27 NOTE — Progress Notes (Signed)
Progress Note from the Palliative Medicine Team at [facility]:   -patient is much more calm and cooperative this morning  -she is verbalizes concern that "the nurses are mean and don't give me my pain medicine on time"  "they made me wait for a drink last night"   -again broached the conversation regarding diagnosis and prognosis, hoping to establish a realistic/ safe  Discharge plan, patient was able to verbalize her understanding of her diagnosis and her limited treatment options and prognosis, turning to her mother "I am going to die"--this is the first time patient has made reference to her mortality   Objective: Allergies  Allergen Reactions  . Sulfa Antibiotics Other (See Comments)    Unknown-pt states she is unsure of what her reaction is  . Sulfamethoxazole-Trimethoprim Other (See Comments)    Unknown reaction   Scheduled Meds: . docusate sodium  100 mg Oral BID  . enoxaparin (LOVENOX) injection  40 mg Subcutaneous Q24H  . levothyroxine  125 mcg Oral QAC breakfast  . LORazepam  1 mg Oral BID  . piperacillin-tazobactam (ZOSYN)  IV  3.375 g Intravenous Q8H  . potassium chloride  40 mEq Oral Once  . sertraline  200 mg Oral QPM  . thiothixene  2 mg Oral QHS   Continuous Infusions:  PRN Meds:.acetaminophen **OR** acetaminophen, albuterol, HYDROcodone-acetaminophen, HYDROmorphone (DILAUDID) injection, HYDROmorphone, ondansetron **OR** ondansetron (ZOFRAN) IV, senna-docusate  BP 111/69 mmHg  Pulse 71  Temp(Src) 98.1 F (36.7 C) (Oral)  Resp 16  Ht 4\' 10"  (1.473 m)  Wt 75.297 kg (166 lb)  BMI 34.70 kg/m2  SpO2 97%   PPS:50 %  Pain Score:  Rates it "off the chart"  Pain Location abdomen, radiating to back and thighs    Intake/Output Summary (Last 24 hours) at 05/27/14 1140 Last data filed at 05/27/14 1042  Gross per 24 hour  Intake   2020 ml  Output      0 ml  Net   2020 ml       Physical Exam:  General: chronically ill appearing, NAD Abdomen:  distended and firm, tender to gentle palpations   Labs: CBC    Component Value Date/Time   WBC 9.9 05/27/2014 0500   WBC 5.0 02/18/2014 1521   RBC 3.35* 05/27/2014 0500   RBC 4.45 02/18/2014 1521   HGB 9.5* 05/27/2014 0500   HGB 12.9 02/18/2014 1521   HCT 29.3* 05/27/2014 0500   HCT 38.8 02/18/2014 1521   PLT 227 05/27/2014 0500   PLT 163 02/18/2014 1521   MCV 87.5 05/27/2014 0500   MCV 87.3 02/18/2014 1521   MCH 28.4 05/27/2014 0500   MCH 28.9 02/18/2014 1521   MCHC 32.4 05/27/2014 0500   MCHC 33.2 02/18/2014 1521   RDW 13.3 05/27/2014 0500   RDW 14.3 02/18/2014 1521   LYMPHSABS 1.4 05/22/2014 1823   LYMPHSABS 1.5 02/18/2014 1521   MONOABS 1.2* 05/22/2014 1823   MONOABS 0.5 02/18/2014 1521   EOSABS 0.5 05/22/2014 1823   EOSABS 0.1 02/18/2014 1521   BASOSABS 0.0 05/22/2014 1823   BASOSABS 0.0 02/18/2014 1521    BMET    Component Value Date/Time   NA 138 05/27/2014 0500   NA 139 02/18/2014 1521   K 3.0* 05/27/2014 0500   K 4.2 02/18/2014 1521   CL 104 05/27/2014 0500   CO2 25 05/27/2014 0500   CO2 27 02/18/2014 1521   GLUCOSE 99 05/27/2014 0500   GLUCOSE 93 02/18/2014 1521   BUN 8  05/27/2014 0500   BUN 7.8 02/18/2014 1521   CREATININE 0.94 05/27/2014 0500   CREATININE 0.7 02/18/2014 1521   CALCIUM 8.6 05/27/2014 0500   CALCIUM 9.9 02/18/2014 1521   GFRNONAA 65* 05/27/2014 0500   GFRAA 75* 05/27/2014 0500    CMP     Component Value Date/Time   NA 138 05/27/2014 0500   NA 139 02/18/2014 1521   K 3.0* 05/27/2014 0500   K 4.2 02/18/2014 1521   CL 104 05/27/2014 0500   CO2 25 05/27/2014 0500   CO2 27 02/18/2014 1521   GLUCOSE 99 05/27/2014 0500   GLUCOSE 93 02/18/2014 1521   BUN 8 05/27/2014 0500   BUN 7.8 02/18/2014 1521   CREATININE 0.94 05/27/2014 0500   CREATININE 0.7 02/18/2014 1521   CALCIUM 8.6 05/27/2014 0500   CALCIUM 9.9 02/18/2014 1521   PROT 6.7 05/23/2014 0605   ALBUMIN 2.9* 05/23/2014 0605   AST 20 05/23/2014 0605   ALT 13  05/23/2014 0605   ALKPHOS 57 05/23/2014 0605   BILITOT 0.6 05/23/2014 0605   GFRNONAA 65* 05/27/2014 0500   GFRAA 75* 05/27/2014 0500     Assessment and Plan: 1. Code Status: Full code  2. Symptom Control:  Secure PICC line-consider continuous gtt once PICC line is secured       Pain: Continue Dilaudid 1 mg IV every 2 hrs prn   3. Psycho/Social:  Emotional support to patietn and her mother.  Mother is confused at times and has limited insight into her daughter's situation   4. Spiritual  Chalplain involved   5. Disposition:  Discussed with patient possibility of hospice services on discharge.  I believe both pateint and family would benefit greatly from hospice services.   Time In Time Out Total Time Spent with Patient Total Overall Time  0745 0820 35 min 35 min    Greater than 50%  of this time was spent counseling and coordinating care related to the above assessment and plan.  Wadie Lessen NP  Palliative Medicine Team Team Phone # [phone] Pager [phone]  Discussed with Dr Barbaraann Rondo 1

## 2014-05-27 NOTE — Progress Notes (Addendum)
[facility]                                                                              Patient Demographics  [name], is a 59 y.o. female, DOB - May 09, 1955, GUY:[id number]  Admit date - 05/22/2014   Admitting Physician [name], MD  Outpatient Primary MD for the patient is [name], FNP  LOS - 4   Chief Complaint  Patient presents with  . Abdominal Pain  . Nausea  . Emesis  . Constipation      HPI on 05/22/2014 by Dr. [name] Patient with complex medical hx. extensive history of noncompliance and drug seeking behavior and polysubstance abuse. 2013 patient was found to have early stage endometrial cancer. 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 Patient did not follow up with gyn oncology.  In 2015 she presented with vaginal bleeding, pelvic pain and discharge. 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  CT at that time showed new lobulated soft tissue mass involving vaginal cuff and posterior wall of the urinary bladder, with areas of internal necrosis, measuring 3.7 x 6.9 cm; there was also new pelvic adenopathy at iliac bifurcations bilaterally, but no obvious disease outside of pelvis, including nothing involving bowels, and no hydronephrosis. Patient had an admission for diverticulitis in October 2015 and ended time was able to be seen in-house by radiation oncology and oncology which point palliative radiation therapy has been started. Patient has been going to her treatments intermittently. She has completed only about 1.5 [name] has 6 treatments. Radiation oncologist started her on Dilaudid 4 mg as needed for pain. She is supposed to follow-up with pain clinic but canceled her appointment. Patient continued to  have worsening abdominal pain but started to have stabbing pain in her vaginal and rectal area. She have had frequent urination in seen pus from her rectal area. She has been intermittently tearful. Reports low-grade fevers. Today she was seen again by radiation oncology. Who has discussed potential hospice or palliative care evaluation. Patient was recommended to present to emergency department so that the father workup could be done. Patient currently resides with her 63 year old mother. Scan today showed progression of her tumor burden 7 .4 cm necrotic pelvic mass arising from the vaginal cuff, Increased. 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 Suspected involvement of the bladder. Moderate right hydroureteronephrosis, likely reflecting extrinsic compression by tumor. Unfortunately patient and the family have very poor insight. Patient has been verbally abusive to the staff and her family. At this point she is concentrated on pain management. Neither patient nor her family at this point is ready to have a conversation about her prognosis stating that "she'll be just fine". Unable to finish interview due to patient becoming agitated and screaming out.  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 UA showed evidence of UTI/ pateint with elevated WBC. Transiently tachycardic. NO nausea or vomiting. She refuses trial of PO pain medications and threatens to leave AMA.  Assessment & Plan   Sepsis secondary to UTI -Upon admission, patient was febrile, tachycardic, with leukocytosis -Leukocytosis resolved, vitals signs resolved.  -UA: WBC TNTC, moderate leukocytes, many bacteria -Urine culture from 2/25 not found; urine culture 05/25/2014 shows no growth -blood cultures show no growth to date -Initially placed on zosyn- will discontinue today  Metastatic Endometrial cancer with hydronephrosis/ Cancer  associated pain -CT abdomen and pelvis shows 7.4 necrotic pelvic mass arising in the vaginal cuff, increased. 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 [name] (GynOnc) who spoke with Dr. [name] (RadOnc) and recommended patient have 10 days of palliative radiation during hospitalization as she supposedly could not make it to her appointments. -Patient has a very unfortunate social situation.  Social work consulted and assisting. -Palliative care consult and appreciated- changed her medications to Dialudid IV 1mg  q2h and PO 2mg  q3h PRN -Continuously asks for more IV pain medication.  There is no doubt that patient has pain, however at the time of examination, she appears comfortable and falls asleep easily.  Repeatedly explained to patient that there are too many side effects with increased pain medications, including decreased respiratory drive.  -Patient is also receiving Ativan scheduled  Essential hypertension -Currently stable, on no home medications -Will continue to monitor  Hypothyroidism -TSH 22.742 (wonder if patient was taking her synthroid) -Continue Synthroid- increase dose to 130mcg daily  Anxiety/Depression/Psychosis -Continue Ativan, sertraline, thiothixene -Consulted psychiatry for medication assistance  Asthma -Currently stable, continue albuterol PRN  Hypokalemia -Will replace and continue to monitor.   Code Status: Full  Family Communication: Mother at bedside  Disposition Plan: Admitted,  Continue palliative radiation- 9 more treatments which occur M-F  Time Spent in minutes   20 minutes  Procedures  Palliative Radiation   Consults   Oncology Radiation oncology [facility], via phone Palliative care  DVT Prophylaxis  lovenox  Lab Results  Component Value Date   PLT 227 05/27/2014    Medications  Scheduled Meds: . docusate sodium  100 mg Oral BID  . enoxaparin (LOVENOX) injection  40 mg Subcutaneous Q24H  . levothyroxine  125 mcg Oral QAC breakfast  . LORazepam  1 mg Oral BID  . piperacillin-tazobactam (ZOSYN)  IV  3.375 g Intravenous Q8H  . potassium chloride  40 mEq Oral Once  . sertraline  200 mg Oral QPM  . thiothixene  2 mg Oral QHS   Continuous Infusions:  PRN Meds:.acetaminophen **OR** acetaminophen, albuterol, HYDROcodone-acetaminophen, HYDROmorphone (DILAUDID) injection, HYDROmorphone, ondansetron **OR** ondansetron (ZOFRAN) IV, senna-docusate  Antibiotics    Anti-infectives    Start     Dose/Rate Route Frequency Ordered Stop   05/25/14 1931  piperacillin-tazobactam (ZOSYN) IVPB 3.375 g  Status:  Discontinued     3.375 g 100 mL/hr over 30 Minutes Intravenous 3 times per day 05/25/14 1931 05/25/14 1935   05/23/14 0830  piperacillin-tazobactam (ZOSYN) IVPB 3.375 g     3.375 g 12.5 mL/hr over 240 Minutes Intravenous Every 8 hours 05/23/14 0804     05/22/14 2215  cefTRIAXone (ROCEPHIN) 1 g in dextrose 5 % 50 mL IVPB  1 g 100 mL/hr over 30 Minutes Intravenous  Once 05/22/14 2209 05/22/14 2342        Subjective:   Caren Griffins [place] seen and examined today.  Patient currently going to the bathroom.  Gait slow but normal.  She continues to complain of pain, but appears very sleepy.   Objective:   Filed Vitals:   05/26/14 0435 05/26/14 1430 05/26/14 2108 05/27/14 0458  BP: 107/66 112/69 114/73 111/69  Pulse: 71 74 80 71  Temp: 98.2 F (36.8 C) 98.6 F (37 C) 98.6 F (37 C) 98.1 F (36.7 C)  TempSrc: Oral Oral Oral Oral    Resp: 16 18 16 16   Height:      Weight:      SpO2: 98% 96% 95% 97%    Wt Readings from Last 3 Encounters:  05/24/14 75.297 kg (166 lb)  01/25/14 75.569 kg (166 lb 9.6 oz)  01/06/14 79.243 kg (174 lb 11.2 oz)     Intake/Output Summary (Last 24 hours) at 05/27/14 1140 Last data filed at 05/27/14 1042  Gross per 24 hour  Intake   2020 ml  Output      0 ml  Net   2020 ml    Exam  General: Well developed, NAD  Cardiovascular: S1 S2 auscultated, RRR  Neuro: Awake and oriented.  Gait slow but normal.  Nonfocal  Psych: Depressed/flat   Data Review   Micro Results Recent Results (from the past 240 hour(s))  MRSA PCR Screening     Status: None   Collection Time: 05/23/14  2:55 AM  Result Value Ref Range Status   MRSA by PCR NEGATIVE NEGATIVE Final    Comment:        The GeneXpert MRSA Assay (FDA approved for NASAL specimens only), is one component of a comprehensive MRSA colonization surveillance program. It is not intended to diagnose MRSA infection nor to guide or monitor treatment for MRSA infections.   Culture, blood (routine x 2)     Status: None (Preliminary result)   Collection Time: 05/23/14 11:05 PM  Result Value Ref Range Status   Specimen Description BLOOD RIGHT ARM  Final   Special Requests BOTTLES DRAWN AEROBIC ONLY 10CC  Final   Culture   Final           BLOOD CULTURE RECEIVED NO GROWTH TO DATE CULTURE WILL BE HELD FOR 5 DAYS BEFORE ISSUING A FINAL NEGATIVE REPORT Performed at [facility]    Report Status PENDING  Incomplete  Culture, blood (routine x 2)     Status: None (Preliminary result)   Collection Time: 05/23/14 11:10 PM  Result Value Ref Range Status   Specimen Description BLOOD RIGHT HAND  Final   Special Requests BOTTLES DRAWN AEROBIC ONLY [place]  Final   Culture   Final           BLOOD CULTURE RECEIVED NO GROWTH TO DATE CULTURE WILL BE HELD FOR 5 DAYS BEFORE ISSUING A FINAL NEGATIVE REPORT Performed at [facility]     Report Status PENDING  Incomplete  Culture, Urine     Status: None   Collection Time: 05/25/14 12:30 PM  Result Value Ref Range Status   Specimen Description URINE, CLEAN CATCH  Final   Special Requests NONE  Final   Colony Count NO GROWTH Performed at [facility]   Final   Culture NO GROWTH Performed at [facility]   Final   Report Status 05/27/2014 FINAL  Final  Radiology Reports Ct Abdomen Pelvis W Contrast  05/22/2014   CLINICAL DATA:  Endometrial cancer, nausea, pelvic drainage  EXAM: CT ABDOMEN AND PELVIS WITH CONTRAST  TECHNIQUE: Multidetector CT imaging of the abdomen and pelvis was performed using the standard protocol following bolus administration of intravenous contrast.  CONTRAST:  161mL OMNIPAQUE IOHEXOL 300 MG/ML SOLN, 59mL OMNIPAQUE IOHEXOL 300 MG/ML SOLN  COMPARISON:  03/06/2014  FINDINGS: Lower chest:  Lung bases are clear.  Hepatobiliary: Liver is within normal limits.  Gallbladder is unremarkable. No intrahepatic or extrahepatic ductal dilatation.  Pancreas: Within normal limits.  Spleen: Splenomegaly, measuring 15.5 cm in maximal dimension.  Adrenals/Urinary Tract: Adrenal glands are within normal limits.  Left kidney is within normal limits.  Malrotated right kidney. Moderate hydroureteronephrosis, likely secondary to extrinsic compression.  Thick-walled bladder.  Stomach/Bowel: Stomach is within normal limits.  No evidence of bowel obstruction.  Appendix is not discretely visualized and may be surgically absent.  Moderate colonic stool burden, suggesting constipation.  Sigmoid colon is thick-walled and likely involved via the pelvic mass (described below).  Mass like thickening involving the rectum (series 2/image 57), new.  Vascular/Lymphatic: No evidence of abdominal aortic aneurysm.  Small subcentimeter retroperitoneal nodes, indeterminate.  Scattered nodes along the sigmoid mesocolon measuring up to 10 mm (series 2/image 52), along with bilateral  internal iliac nodes measuring 19-20 mm (series 2/image 57), compatible with nodal metastases.  Reproductive: Status post hysterectomy.  7.4 x 5.7 x 6.1 cm necrotic pelvic mass (series 2/image 62), likely arising from the vaginal cuff, previously 3.2 x 5.6 cm. Gas within the lesion suggest direct communication with the sigmoid colon/ rectum. Additionally, the mass abuts the posterior wall of the bladder, which is also likely involved.  Other: No abdominopelvic ascites.  Musculoskeletal: Degenerative changes of the thoracolumbar spine, most prominent at L5-S1.  IMPRESSION: 7.4 cm necrotic pelvic mass arising from the vaginal cuff, increased.  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 Suspected involvement of the bladder.  Associated pelvic nodal metastases.  Moderate right hydroureteronephrosis, likely reflecting extrinsic compression by tumor.   Electronically Signed   By: Julian Hy M.D.   On: 05/22/2014 21:59    CBC  Recent Labs Lab 05/22/14 1823 05/23/14 0605 05/24/14 0530 05/25/14 0506 05/27/14 0500  WBC 13.4* 12.6* 13.3* 10.6* 9.9  HGB 10.5* 9.7* 9.8* 9.5* 9.5*  HCT 32.5* 29.4* 29.6* 29.2* 29.3*  PLT 220 206 215 216 227  MCV 86.4 85.7 86.5 86.9 87.5  MCH 27.9 28.3 28.7 28.3 28.4  MCHC 32.3 33.0 33.1 32.5 32.4  RDW 12.9 12.9 13.0 13.0 13.3  LYMPHSABS 1.4  --   --   --   --   MONOABS 1.2*  --   --   --   --   EOSABS 0.5  --   --   --   --   BASOSABS 0.0  --   --   --   --     Chemistries   Recent Labs Lab 05/22/14 1823 05/23/14 0605 05/24/14 0530 05/25/14 0506 05/27/14 0500  NA 136 131* 137 139 138  K 4.0 3.4* 3.6 3.4* 3.0*  CL 103 101 103 106 104  CO2 24 22 23 25 25   GLUCOSE 107* 96 93 96 99  BUN 11 10 10 10 8   CREATININE 1.03 0.99 1.13* 0.98 0.94  CALCIUM 9.0 8.3* 8.7 8.3* 8.6  MG  --  2.0  --   --   --   AST 19 20  --   --   --  ALT 15 13  --   --   --   ALKPHOS 61 57  --   --   --   BILITOT 0.5 0.6  --   --   --     ------------------------------------------------------------------------------------------------------------------ estimated creatinine clearance is 55.6 mL/min (by C-G formula based on Cr of 0.94). ------------------------------------------------------------------------------------------------------------------ No results for input(s): HGBA1C in the last 72 hours. ------------------------------------------------------------------------------------------------------------------ No results for input(s): CHOL, HDL, LDLCALC, TRIG, CHOLHDL, LDLDIRECT in the last 72 hours. ------------------------------------------------------------------------------------------------------------------ No results for input(s): TSH, T4TOTAL, T3FREE, THYROIDAB in the last 72 hours.  Invalid input(s): FREET3 ------------------------------------------------------------------------------------------------------------------ No results for input(s): VITAMINB12, FOLATE, FERRITIN, TIBC, IRON, RETICCTPCT in the last 72 hours.  Coagulation profile No results for input(s): INR, PROTIME in the last 168 hours.  No results for input(s): DDIMER in the last 72 hours.  Cardiac Enzymes No results for input(s): CKMB, TROPONINI, MYOGLOBIN in the last 168 hours.  Invalid input(s): CK ------------------------------------------------------------------------------------------------------------------ Invalid input(s): POCBNP    Demir Titsworth D.O. on 05/27/2014 at 11:40 AM  Between 7am to 7pm - Pager - [phone]  After 7pm go to [email or web address] - password [email or web address]  And look for the night coverage person covering for me after hours  [facility] Office  [phone]

## 2014-05-28 ENCOUNTER — Ambulatory Visit
Admit: 2014-05-28 | Discharge: 2014-05-28 | Disposition: A | Discharge status: Home / Self Care | Payer: Medicaid Other | Attending: Radiation Oncology | Admitting: Radiation Oncology

## 2014-05-28 ENCOUNTER — Encounter: Payer: Self-pay | Admitting: Radiation Oncology

## 2014-05-28 DIAGNOSIS — C541 Malignant neoplasm of endometrium: Secondary | ICD-10-CM

## 2014-05-28 DIAGNOSIS — F4323 Adjustment disorder with mixed anxiety and depressed mood: Secondary | ICD-10-CM | POA: Diagnosis present

## 2014-05-28 LAB — BASIC METABOLIC PANEL
Anion gap: 8 (ref 5–15)
BUN: 8 mg/dL (ref 6–23)
CALCIUM: 8.4 mg/dL (ref 8.4–10.5)
CO2: 25 mmol/L (ref 19–32)
Chloride: 106 mmol/L (ref 96–112)
Creatinine, Ser: 0.81 mg/dL (ref 0.50–1.10)
GFR calc Af Amer: >90 mL/min (ref >90)
GFR calc non Af Amer: 78 mL/min — ABNORMAL LOW (ref >90)
GLUCOSE: 92 mg/dL (ref 70–99)
Potassium: 3.2 mmol/L — ABNORMAL LOW (ref 3.5–5.1)
Sodium: 139 mmol/L (ref 135–145)

## 2014-05-28 LAB — CLOSTRIDIUM DIFFICILE BY PCR: Toxigenic C. Difficile by PCR: NEGATIVE

## 2014-05-28 LAB — CBC
HEMATOCRIT: 28.7 % — AB (ref 36.0–46.0)
Hemoglobin: 9.3 g/dL — ABNORMAL LOW (ref 12.0–15.0)
MCH: 28.4 pg (ref 26.0–34.0)
MCHC: 32.4 g/dL (ref 30.0–36.0)
MCV: 87.8 fL (ref 78.0–100.0)
Platelets: 241 10*3/uL (ref 150–400)
RBC: 3.27 MIL/uL — AB (ref 3.87–5.11)
RDW: 13.5 % (ref 11.5–15.5)
WBC: 9 10*3/uL (ref 4.0–10.5)

## 2014-05-28 MED ORDER — ARIPIPRAZOLE 5 MG PO TABS
5.0000 mg | ORAL_TABLET | Freq: Two times a day (BID) | ORAL | Status: DC
  Administered 2014-05-29 – 2014-06-05 (×4): 5 mg via ORAL
  Filled 2014-05-28 (×20): qty 1

## 2014-05-28 NOTE — Progress Notes (Signed)
  Radiation Oncology         [phone] ________________________________  Name: [name] MRN: [id number]  Date: 05/28/2014  DOB: 1955/08/18  Weekly Radiation Therapy Management - inpatient  Diagnosis: Recurrent endometrial cancer  Current Dose: 9 Gy     Planned Dose:  30 Gy ( 3 of 10 treatments)  Narrative . . . . . . . . The patient presents for routine under treatment assessment.                                   The patient continues to complain of severe pain in the pelvis region. Unchanged thus far since admission.                                 Set-up films were reviewed.                                 The chart was checked. Physical Findings. . . Patient is lying in her hospital bed, she continues to be quite emotional. The abdomen is somewhat distended with bowel sounds normal. No rebound or guarding present. Impression . . . . . . . The patient is tolerating radiation. Plan . . . . . . . . . . . . Continue treatment as planned.  ________________________________   Blair Promise, PhD, MD

## 2014-05-28 NOTE — Progress Notes (Signed)
Clinical Social Work Department CLINICAL SOCIAL WORK PSYCHIATRY SERVICE LINE ASSESSMENT 05/28/2014  Patient:  [name]  Account:  [id number]  Admit Date:  05/22/2014  Clinical Social Worker:  Sindy Messing, LCSW  Date/Time:  05/28/2014 11:45 AM Referred by:  Physician  Date referred:  05/28/2014  Presenting Symptoms/Problems (In the person's/family's own words):   Medication management and depression   Abuse/Neglect/Trauma History (check all that apply)  Denies history   Abuse/Neglect/Trauma Comments:   N/A   Psychiatric History (check all that apply)  Outpatient treatment   Psychiatric medications:  Abilify 5 mg  Ativan 1 mg  Zoloft 200 mg   Current Mental Health Hospitalizations/Previous Mental Health History:   Patient reports depression related to cancer diagnosis. Patient reports she occasionally feels angry or agitated due to depression.   Current provider:   None currently   Place and Date:   N/A   Current Medications:   Scheduled Meds:      . ARIPiprazole  5 mg Oral BID  . docusate sodium  100 mg Oral BID  . enoxaparin (LOVENOX) injection  40 mg Subcutaneous Q24H  . levothyroxine  137 mcg Oral QAC breakfast  . LORazepam  1 mg Oral BID  . potassium chloride  40 mEq Oral Once  . sertraline  200 mg Oral QPM        Continuous Infusions:      PRN Meds:.acetaminophen **OR** acetaminophen, albuterol, HYDROcodone-acetaminophen, HYDROmorphone (DILAUDID) injection, HYDROmorphone, ondansetron **OR** ondansetron (ZOFRAN) IV, senna-docusate       Previous Impatient Admission/Date/Reason:   None reported   Emotional Health / Current Symptoms    Suicide/Self Harm  None reported   Suicide attempt in the past:   Patient denies any SI or HI.   Other harmful behavior:   None reported   Psychotic/Dissociative Symptoms  None reported   Other Psychotic/Dissociative Symptoms:   N/A    Attention/Behavioral Symptoms  Withdrawn   Other Attention /  Behavioral Symptoms:   Patient agitated during assessment and wanted to end session short due to being upset with staff.    Cognitive Impairment  Within Normal Limits   Other Cognitive Impairment:   Patient alert and oriented.    Mood and Adjustment  Labile  Guarded    Stress, Anxiety, Trauma, Any Recent Loss/Stressor  Other - See comment   Anxiety (frequency):   N/A   Phobia (specify):   N/A   Compulsive behavior (specify):   N/A   Obsessive behavior (specify):   N/A   Other:   Patient concerned about cancer diagnosis and outcomes related to medical conditions.   Substance Abuse/Use  None   SBIRT completed (please refer for detailed history):  N  Self-reported substance use:   Patient denies any substance use.   Urinary Drug Screen Completed:  N Alcohol level:   N/A    Environmental/Housing/Living Arrangement  Stable housing   Who is in the home:   Mother   Emergency contact:  Bertha-mom   Financial  Medicaid   Patient's Strengths and Goals (patient's own words):   Patient reports good relationship with mother and stable housing.   Clinical Social Worker's Interpretive Summary:   CSW received referral in order to complete psychosocial assessment. CSW reviewed chart and met with patient and mother at bedside along with psych MD.    Patient reports she is disabled and lives at home with mother. Patient was admitted due to complications with cancer. Patient reports she is angry about cancer and  has had to miss radiation treatments due to pain. Patient remains fixated on pain problems and asks psych MD for pain medication throughout assessment. Patient reports it is not fair that she has this diagnosis and is unable to manage her pain.    Patient guarded when discussing depression and symptoms. Patient believes that depression is directly related to cancer diagnosis and acknowledges that she is often angry. Patient is unable to describe certain  behaviors but tells story of getting upset with staff due to medication and drink needs. Patient reports she is "a normal person" and reacts to others as anyone else would if they were angry.    Psych MD to evaluate medications and make recommendations in order to stabilize mood. Patient reports she does not want to continue assessment because of pain management. CSW will continue to follow to assist with supporting patient throughout hospital stay.   Disposition:  Recommend Psych CSW continuing to support while in hospital   [place], [place] [phone]

## 2014-05-28 NOTE — Care Management (Signed)
CARE MANAGEMENT NOTE 05/28/2014  Patient:  [name],[name]   Account Number:  [id number]  Date Initiated:  05/28/2014  Documentation initiated by:  Marney Doctor  Subjective/Objective Assessment:   59 yo admitted with Essential hypertension/recurrent endometroid carcinoma     Action/Plan:   From home with parent   Anticipated DC Date:  06/05/2014   Anticipated DC Plan:  [place]  CM consult      Choice offered to / List presented to:             Status of service:  In process, will continue to follow Medicare Important Message given?   (If response is "NO", the following Medicare IM given date fields will be blank) Date Medicare IM given:   Medicare IM given by:   Date Additional Medicare IM given:   Additional Medicare IM given by:    Discharge Disposition:    Per UR Regulation:  Reviewed for med. necessity/level of care/duration of stay  If discussed at [place] of Stay Meetings, dates discussed:    Comments:  05/28/14 Marney Doctor RN,BSN,NCM [phone] Chart reviewed and CM following for DC needs.

## 2014-05-28 NOTE — Progress Notes (Signed)
Gailya was transported to the floor by Rodolph Bong, Nurse [employer].

## 2014-05-28 NOTE — Progress Notes (Signed)
[facility]                                                                              Patient Demographics  [name], is a 59 y.o. female, DOB - 1955-07-09, MGQ:[id number]  Admit date - 05/22/2014   Admitting Physician [name], MD  Outpatient Primary MD for the patient is [name], FNP  LOS - 5   Chief Complaint  Patient presents with  . Abdominal Pain  . Nausea  . Emesis  . Constipation      HPI on 05/22/2014 by Dr. [name] Patient with complex medical hx. extensive history of noncompliance and drug seeking behavior and polysubstance abuse. 2013 patient was found to have early stage endometrial cancer. 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 Patient did not follow up with gyn oncology.  In 2015 she presented with vaginal bleeding, pelvic pain and discharge. 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  CT at that time showed new lobulated soft tissue mass involving vaginal cuff and posterior wall of the urinary bladder, with areas of internal necrosis, measuring 3.7 x 6.9 cm; there was also new pelvic adenopathy at iliac bifurcations bilaterally, but no obvious disease outside of pelvis, including nothing involving bowels, and no hydronephrosis. Patient had an admission for diverticulitis in October 2015 and ended time was able to be seen in-house by radiation oncology and oncology which point palliative radiation therapy has been started. Patient has been going to her treatments intermittently. She has completed only about 1.5 [name] has 6 treatments. Radiation oncologist started her on Dilaudid 4 mg as needed for pain. She is supposed to follow-up with pain clinic but canceled her appointment. Patient continued to  have worsening abdominal pain but started to have stabbing pain in her vaginal and rectal area. She have had frequent urination in seen pus from her rectal area. She has been intermittently tearful. Reports low-grade fevers. Today she was seen again by radiation oncology. Who has discussed potential hospice or palliative care evaluation. Patient was recommended to present to emergency department so that the father workup could be done. Patient currently resides with her 25 year old mother. Scan today showed progression of her tumor burden 7 .4 cm necrotic pelvic mass arising from the vaginal cuff, Increased. 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 Suspected involvement of the bladder. Moderate right hydroureteronephrosis, likely reflecting extrinsic compression by tumor. Unfortunately patient and the family have very poor insight. Patient has been verbally abusive to the staff and her family. At this point she is concentrated on pain management. Neither patient nor her family at this point is ready to have a conversation about her prognosis stating that "she'll be just fine". Unable to finish interview due to patient becoming agitated and screaming out.  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 UA showed evidence of UTI/ pateint with elevated WBC. Transiently tachycardic. NO nausea or vomiting. She refuses trial of PO pain medications and threatens to leave AMA.  Interim history -Patient continues to need IV pain medication. She will complete another 7 radiation treatments.  Assessment & Plan   Sepsis secondary to UTI -Upon admission, patient was febrile, tachycardic, with leukocytosis -Leukocytosis resolved, vitals signs resolved.  -UA: WBC TNTC, moderate leukocytes, many bacteria -Urine culture from 2/25 not found; urine culture 05/25/2014 shows no growth -blood cultures show no growth to  date -Initially placed on zosyn- discontinued 05/27/2014  Metastatic Endometrial cancer with hydronephrosis/ Cancer associated pain -CT abdomen and pelvis shows 7.4 necrotic pelvic mass arising in the vaginal cuff, increased. 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 [name] (GynOnc) who spoke with Dr. [name] (RadOnc) and recommended patient have 10 days of palliative radiation during hospitalization as she supposedly could not make it to her appointments. -Patient has a very unfortunate social situation.  Social work consulted and assisting. -Palliative care consult and appreciated- changed her medications to Dialudid IV 1mg  q2h and PO 2mg  q3h PRN -Continuously asks for more IV pain medication.  There is no doubt that patient has pain, however at the time of examination, she appears comfortable and falls asleep easily.  Repeatedly explained to patient that there are too many side effects with increased pain medications, including decreased respiratory drive.  -Patient is also receiving Ativan scheduled  Essential hypertension -Currently stable, on no home medications -Will continue to monitor  Hypothyroidism -TSH 22.742 (wonder if patient was taking her synthroid) -Continue Synthroid- increased dose to 152mcg daily  Anxiety/Depression/Psychosis -Continue Ativan, sertraline, thiothixene -Consulted psychiatry for medication assistance- consultation recommendations pending  Asthma -Currently stable, continue albuterol  PRN  Hypokalemia -Will replace and continue to monitor.   Code Status: Full  Family Communication: Mother at bedside  Disposition Plan: Admitted, Continue palliative radiation- 7 more treatments which occur M-F  Time Spent in minutes   20 minutes  Procedures  Palliative Radiation   Consults   Oncology Radiation oncology [facility], via phone Palliative care  DVT Prophylaxis  lovenox  Lab Results  Component Value Date   PLT 241 05/28/2014    Medications  Scheduled Meds: . docusate sodium  100 mg Oral BID  . enoxaparin (LOVENOX) injection  40 mg Subcutaneous Q24H  . levothyroxine  137 mcg Oral QAC breakfast  . LORazepam  1 mg Oral BID  . potassium chloride  40 mEq Oral Once  . sertraline  200 mg Oral QPM  . thiothixene  2 mg Oral QHS   Continuous Infusions:  PRN Meds:.acetaminophen **OR** acetaminophen, albuterol, HYDROcodone-acetaminophen, HYDROmorphone (DILAUDID) injection, HYDROmorphone, ondansetron **OR** ondansetron (ZOFRAN) IV, senna-docusate  Antibiotics    Anti-infectives    Start     Dose/Rate Route Frequency Ordered Stop   05/25/14 1931  piperacillin-tazobactam (ZOSYN) IVPB 3.375 g  Status:  Discontinued     3.375 g 100 mL/hr over 30 Minutes Intravenous 3 times per day 05/25/14 1931 05/25/14 1935   05/23/14 0830  piperacillin-tazobactam (ZOSYN) IVPB 3.375 g  Status:  Discontinued     3.375 g 12.5 mL/hr over 240 Minutes Intravenous Every 8 hours 05/23/14 0804 05/27/14 1211   05/22/14 2215  cefTRIAXone (ROCEPHIN) 1  g in dextrose 5 % 50 mL IVPB     1 g 100 mL/hr over 30 Minutes Intravenous  Once 05/22/14 2209 05/22/14 2342        Subjective:   Caren Griffins Manson seen and examined today.  Patient states she continues to be in pain. She states that last night, she thought was a nurse was being mean to her. Patient continuously assess for increase in her Dilaudid. She states she did not know that she had oral Dilaudid ordered. Patient has no  specific complaints at this time.  Objective:   Filed Vitals:   05/27/14 0458 05/27/14 1400 05/27/14 2053 05/28/14 0651  BP: 111/69 119/72 116/54 141/73  Pulse: 71 87 84 86  Temp: 98.1 F (36.7 C) 99.8 F (37.7 C) 98.6 F (37 C) 99.4 F (37.4 C)  TempSrc: Oral Oral Oral Oral  Resp: 16 18 18 18   Height:      Weight:      SpO2: 97% 96% 96% 96%    Wt Readings from Last 3 Encounters:  05/24/14 75.297 kg (166 lb)  01/25/14 75.569 kg (166 lb 9.6 oz)  01/06/14 79.243 kg (174 lb 11.2 oz)     Intake/Output Summary (Last 24 hours) at 05/28/14 1000 Last data filed at 05/27/14 1540  Gross per 24 hour  Intake    480 ml  Output      0 ml  Net    480 ml    Exam  General: Well developed, NAD  Cardiovascular: S1 S2 auscultated, RRR  Respiratory: Clear auscultation bilaterally  Abdomen: Soft, obese, diffuse tenderness, positive bowel sounds  Extremities: No clubbing cyanosis or edema  Neuro: Awake and oriented.  Gait slow but normal.  Nonfocal  Psych: Depressed/flat   Data Review   Micro Results Recent Results (from the past 240 hour(s))  MRSA PCR Screening     Status: None   Collection Time: 05/23/14  2:55 AM  Result Value Ref Range Status   MRSA by PCR NEGATIVE NEGATIVE Final    Comment:        The GeneXpert MRSA Assay (FDA approved for NASAL specimens only), is one component of a comprehensive MRSA colonization surveillance program. It is not intended to diagnose MRSA infection nor to guide or monitor treatment for MRSA infections.   Culture, blood (routine x 2)     Status: None (Preliminary result)   Collection Time: 05/23/14 11:05 PM  Result Value Ref Range Status   Specimen Description BLOOD RIGHT ARM  Final   Special Requests BOTTLES DRAWN AEROBIC ONLY 10CC  Final   Culture   Final           BLOOD CULTURE RECEIVED NO GROWTH TO DATE CULTURE WILL BE HELD FOR 5 DAYS BEFORE ISSUING A FINAL NEGATIVE REPORT Performed at [facility]    Report  Status PENDING  Incomplete  Culture, blood (routine x 2)     Status: None (Preliminary result)   Collection Time: 05/23/14 11:10 PM  Result Value Ref Range Status   Specimen Description BLOOD RIGHT HAND  Final   Special Requests BOTTLES DRAWN AEROBIC ONLY [place]  Final   Culture   Final           BLOOD CULTURE RECEIVED NO GROWTH TO DATE CULTURE WILL BE HELD FOR 5 DAYS BEFORE ISSUING A FINAL NEGATIVE REPORT Performed at [facility]    Report Status PENDING  Incomplete  Culture, Urine     Status: None   Collection  Time: 05/25/14 12:30 PM  Result Value Ref Range Status   Specimen Description URINE, CLEAN CATCH  Final   Special Requests NONE  Final   Colony Count NO GROWTH Performed at [facility]   Final   Culture NO GROWTH Performed at [facility]   Final   Report Status 05/27/2014 FINAL  Final    Radiology Reports Ct Abdomen Pelvis W Contrast  05/22/2014   CLINICAL DATA:  Endometrial cancer, nausea, pelvic drainage  EXAM: CT ABDOMEN AND PELVIS WITH CONTRAST  TECHNIQUE: Multidetector CT imaging of the abdomen and pelvis was performed using the standard protocol following bolus administration of intravenous contrast.  CONTRAST:  168mL OMNIPAQUE IOHEXOL 300 MG/ML SOLN, 40mL OMNIPAQUE IOHEXOL 300 MG/ML SOLN  COMPARISON:  03/06/2014  FINDINGS: Lower chest:  Lung bases are clear.  Hepatobiliary: Liver is within normal limits.  Gallbladder is unremarkable. No intrahepatic or extrahepatic ductal dilatation.  Pancreas: Within normal limits.  Spleen: Splenomegaly, measuring 15.5 cm in maximal dimension.  Adrenals/Urinary Tract: Adrenal glands are within normal limits.  Left kidney is within normal limits.  Malrotated right kidney. Moderate hydroureteronephrosis, likely secondary to extrinsic compression.  Thick-walled bladder.  Stomach/Bowel: Stomach is within normal limits.  No evidence of bowel obstruction.  Appendix is not discretely visualized and may be surgically absent.   Moderate colonic stool burden, suggesting constipation.  Sigmoid colon is thick-walled and likely involved via the pelvic mass (described below).  Mass like thickening involving the rectum (series 2/image 57), new.  Vascular/Lymphatic: No evidence of abdominal aortic aneurysm.  Small subcentimeter retroperitoneal nodes, indeterminate.  Scattered nodes along the sigmoid mesocolon measuring up to 10 mm (series 2/image 52), along with bilateral internal iliac nodes measuring 19-20 mm (series 2/image 57), compatible with nodal metastases.  Reproductive: Status post hysterectomy.  7.4 x 5.7 x 6.1 cm necrotic pelvic mass (series 2/image 62), likely arising from the vaginal cuff, previously 3.2 x 5.6 cm. Gas within the lesion suggest direct communication with the sigmoid colon/ rectum. Additionally, the mass abuts the posterior wall of the bladder, which is also likely involved.  Other: No abdominopelvic ascites.  Musculoskeletal: Degenerative changes of the thoracolumbar spine, most prominent at L5-S1.  IMPRESSION: 7.4 cm necrotic pelvic mass arising from the vaginal cuff, increased.  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 Suspected involvement of the bladder.  Associated pelvic nodal metastases.  Moderate right hydroureteronephrosis, likely reflecting extrinsic compression by tumor.   Electronically Signed   By: Julian Hy M.D.   On: 05/22/2014 21:59    CBC  Recent Labs Lab 05/22/14 1823 05/23/14 0605 05/24/14 0530 05/25/14 0506 05/27/14 0500 05/28/14 0544  WBC 13.4* 12.6* 13.3* 10.6* 9.9 9.0  HGB 10.5* 9.7* 9.8* 9.5* 9.5* 9.3*  HCT 32.5* 29.4* 29.6* 29.2* 29.3* 28.7*  PLT 220 206 215 216 227 241  MCV 86.4 85.7 86.5 86.9 87.5 87.8  MCH 27.9 28.3 28.7 28.3 28.4 28.4  MCHC 32.3 33.0 33.1 32.5 32.4 32.4  RDW 12.9 12.9 13.0 13.0 13.3 13.5  LYMPHSABS 1.4  --   --   --   --   --   MONOABS 1.2*  --   --   --   --   --   EOSABS 0.5  --   --   --   --   --     BASOSABS 0.0  --   --   --   --   --     Chemistries   Recent Labs  Lab 05/22/14 1823 05/23/14 0605 05/24/14 0530 05/25/14 0506 05/27/14 0500 05/28/14 0544  NA 136 131* 137 139 138 139  K 4.0 3.4* 3.6 3.4* 3.0* 3.2*  CL 103 101 103 106 104 106  CO2 24 22 23 25 25 25   GLUCOSE 107* 96 93 96 99 92  BUN 11 10 10 10 8 8   CREATININE 1.03 0.99 1.13* 0.98 0.94 0.81  CALCIUM 9.0 8.3* 8.7 8.3* 8.6 8.4  MG  --  2.0  --   --   --   --   AST 19 20  --   --   --   --   ALT 15 13  --   --   --   --   ALKPHOS 61 57  --   --   --   --   BILITOT 0.5 0.6  --   --   --   --    ------------------------------------------------------------------------------------------------------------------ estimated creatinine clearance is 64.6 mL/min (by C-G formula based on Cr of 0.81). ------------------------------------------------------------------------------------------------------------------ No results for input(s): HGBA1C in the last 72 hours. ------------------------------------------------------------------------------------------------------------------ No results for input(s): CHOL, HDL, LDLCALC, TRIG, CHOLHDL, LDLDIRECT in the last 72 hours. ------------------------------------------------------------------------------------------------------------------ No results for input(s): TSH, T4TOTAL, T3FREE, THYROIDAB in the last 72 hours.  Invalid input(s): FREET3 ------------------------------------------------------------------------------------------------------------------ No results for input(s): VITAMINB12, FOLATE, FERRITIN, TIBC, IRON, RETICCTPCT in the last 72 hours.  Coagulation profile No results for input(s): INR, PROTIME in the last 168 hours.  No results for input(s): DDIMER in the last 72 hours.  Cardiac Enzymes No results for input(s): CKMB, TROPONINI, MYOGLOBIN in the last 168 hours.  Invalid input(s):  CK ------------------------------------------------------------------------------------------------------------------ Invalid input(s): POCBNP    Carmela Piechowski D.O. on 05/28/2014 at 10:00 AM  Between 7am to 7pm - Pager - [phone]  After 7pm go to [email or web address] - password [email or web address]  And look for the night coverage person covering for me after hours  [facility] Office  [phone]

## 2014-05-28 NOTE — Progress Notes (Signed)
[name] has completed 3 fractions to her pelvis.  She reports pain at a 10/10 in her vaginal/lower back and lower pelvic area.  She last had 1 mg dilaudid IV at 6:57 today.  She reports she does not urinate very much.  She is having small bowel movements with blood and mucus in them.  She denies any vaginal bleeding.  She has normal saline infusing through her left wrist.  She denies nausea.

## 2014-05-28 NOTE — Consult Note (Signed)
[facility] Consult   Reason for Consult:  Medication management for depression and agitation Referring Physician:  Dr. Ree Kida  Patient Identification: ANASTACIA REINECKE MRN:  [id number] Principal Diagnosis: Adjustment disorder with mixed anxiety and depressed mood Diagnosis:   Patient Active Problem List   Diagnosis Date Noted  . Pelvic mass [R19.00]   . DNR (do not resuscitate) discussion [Z71.89]   . Palliative care encounter [Z51.5]   . 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 Sepsis [A41.9] 05/23/2014  . Anxiety state [F41.1] 01/07/2014  . Behavior disturbance [F91.9] 01/07/2014  . Diverticulitis [K57.92] 01/06/2014  . Depression [F32.9] 10/12/2011  . Endometrial ca [C54.1] 07/21/2011  . Hypothyroidism [E03.9] 11/29/2006  . Essential hypertension [I10] 11/29/2006  . 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 SPINAL STENOSIS [M48.00] 11/29/2006    Total Time spent with patient: 45 minutes  Subjective:   [name] is a 59 y.o. female patient admitted with depression and agitation.  HPI:  [name] is a 59 y.o. female seen along with case manager and reviewed chart for psychiatric consultation and evaluation of increased agitation and verbal aggression towards staff in hospital. Patient is known to psych consultation team from her previous admission to medical floor. Patient blames the hospital staff for not responding to her needs of controlling pain and not providing drink when she was thirsty. Patient states that she has been getting frustrated, upset and angry which she has expressed at home with her mother. Patient states that she needs pain control every minute due to her cancer and starting of radiation therapy. Patient responded well to Abilify during last visit and will give a trail starting today and will reevaluate tomorrow. She denied current suicide or homicide ideation, intention or plans. She has no evidence of psychosis. She has  extensive history of noncompliance and drug seeking behavior and polysubstance abuse. She denied current drug abuse and alcohol. She was seen a psychiatrist at [facility]" for depression and anxiety over five years.   Medical History: Patient has a past medical history of Thyroid disease; Arthritis; Asthma; Depression; Fibromyalgia; Lumbar spondylolysis; Allergy; Endometrial cancer; Spinal stenosis; Hypertension; Dysrhythmia; Sleep apnea; Anxiety; Back pain, chronic; and Obese.   Patient with complex medical hx. extensive history of noncompliance and drug seeking behavior and polysubstance abuse. 2013 patient was found to have early stage endometrial cancer. 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 Patient did not follow up with gyn oncology. In 2015 she presented with vaginal bleeding, pelvic pain and discharge. 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 CT at that time showed new lobulated soft tissue mass involving vaginal cuff and posterior wall of the urinary bladder, with areas of internal necrosis, measuring 3.7 x 6.9 cm; there was also new pelvic adenopathy at iliac bifurcations bilaterally, but no obvious disease outside of pelvis, including nothing involving bowels, and no hydronephrosis. Patient had an admission for diverticulitis in October 2015 and ended time was able to be seen in-house by radiation oncology and oncology which point palliative radiation therapy has been started. Patient has been going to her treatments intermittently. She has completed only about 1.5 [name] has 6 treatments. Radiation oncologist started her on Dilaudid 4 mg as needed for pain. She is supposed to follow-up with pain clinic but canceled her appointment. Patient continued  to have  worsening abdominal pain but started to have stabbing pain in her vaginal and rectal area. She have had frequent urination in seen pus from her rectal area. She has been intermittently tearful. Reports low-grade fevers. Today she was seen again by radiation oncology. Who has discussed potential hospice or palliative care evaluation. Patient was recommended to present to emergency department so that the father workup could be done. Patient currently resides with her 38 year old mother.   Scan today showed progression of her tumor burden 7 .4 cm necrotic pelvic mass arising from the vaginal cuff, Increased. 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 Suspected involvement of the bladder. Moderate right hydroureteronephrosis, likely reflecting extrinsic compression by tumor. Unfortunately patient and the family have very poor insight. Patient has been verbally abusive to the staff and her family. At this point she is concentrated on pain management. Neither patient nor her family at this point is ready to have a conversation about her prognosis stating that "she'll be just fine". Unable to finish interview due to patient becoming agitated and screaming out.  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 UA showed evidence of UTI/ pateint with elevated WBC. Transiently tachycardic. NO nausea or vomiting. She refuses trial of PO pain medications and threatens to leave AMA.   Review of Systems:  Unable to obtain as patient being aggitated  HPI Elements:   Location:  depression, anxiety and agitation. Quality:  poor. Severity:  easily getting upset and angry. Timing:  chronic pain due to cancer. Duration:  few days. Context:  psychosocial stresses.  Past Medical History:  Past Medical History  Diagnosis Date  . Thyroid disease   . Arthritis   . Asthma   . Depression   . Fibromyalgia   .  Lumbar spondylolysis   . Allergy   . Endometrial cancer   . Spinal stenosis   . Hypertension   . Dysrhythmia     OCCAS PAC'S AND PVC'S  PER HOLTER MONITOR STUDY --PT HAS OCCAS CHEST PAINS AND PALPITATIONS--HAD ECHO NORMAL LV SIZE AND FUNCTION.  Marland Kitchen Sleep apnea     HAS CPAP MACHINE BUT DOES NOT USE  . Anxiety     HAS OCD AND ON DISABILITY-PER [name]   . Back pain, chronic   . Obese     Past Surgical History  Procedure Laterality Date  . Cesarean section    . Abdominal hysterectomy      total   Family History:  Family History  Problem Relation Age of Onset  . Hypertension Mother   . Coronary artery disease Father   . Cancer Brother     Renal cell carcinoma  . Hypertension Other   . Cancer Other   . Heart attack Father   . Coronary artery disease Brother    Social History:  History  Alcohol Use No     History  Drug Use No    History   Social History  . Marital Status: Single    Spouse Name: N/A  . Number of Children: 1  . Years of Education: 12th   Occupational History  . disabled    Social History Main Topics  . Smoking status: Never Smoker   . Smokeless tobacco: Never Used  . Alcohol Use: No  . Drug Use: No  . Sexual Activity: Not on file   Other Topics Concern  . None   Social History Narrative   Additional Social History:  Allergies:   Allergies  Allergen Reactions  . Sulfa Antibiotics Other (See Comments)    Unknown-pt states she is unsure of what her reaction is  . Sulfamethoxazole-Trimethoprim Other (See Comments)    Unknown reaction    Vitals: Blood pressure 141/73, pulse 86, temperature 99.4 F (37.4 C), temperature source Oral, resp. rate 18, height 4\' 10"  (1.473 m), weight 75.297 kg (166 lb), SpO2 96 %.  Risk to Self: Is patient at risk for suicide?: No Risk to Others:   Prior Inpatient Therapy:   Prior Outpatient Therapy:    Current Facility-Administered Medications  Medication Dose Route  Frequency Provider Last Rate Last Dose  . acetaminophen (TYLENOL) tablet 650 mg  650 mg Oral Q6H PRN [name], MD   650 mg at 05/23/14 6237   Or  . acetaminophen (TYLENOL) suppository 650 mg  650 mg Rectal Q6H PRN [name], MD      . albuterol (PROVENTIL) (2.5 MG/3ML) 0.083% nebulizer solution 3 mL  3 mL Inhalation Q4H PRN [name], MD      . docusate sodium (COLACE) capsule 100 mg  100 mg Oral BID [name], MD   100 mg at 05/27/14 2213  . enoxaparin (LOVENOX) injection 40 mg  40 mg Subcutaneous Q24H [name], MD   40 mg at 05/26/14 1041  . HYDROcodone-acetaminophen (NORCO/VICODIN) 5-325 MG per tablet 1-2 tablet  1-2 tablet Oral Q4H PRN Knox Royalty, NP   2 tablet at 05/25/14 2139  . HYDROmorphone (DILAUDID) injection 1 mg  1 mg Intravenous Q2H PRN Knox Royalty, NP   1 mg at 05/28/14 6283  . HYDROmorphone (DILAUDID) tablet 2 mg  2 mg Oral Q3H PRN Knox Royalty, NP   2 mg at 05/26/14 1829  . levothyroxine (SYNTHROID, LEVOTHROID) tablet 137 mcg  137 mcg Oral QAC breakfast Maryann Mikhail, DO      . LORazepam (ATIVAN) tablet 1 mg  1 mg Oral BID [name], MD   1 mg at 05/27/14 2213  . ondansetron (ZOFRAN) tablet 4 mg  4 mg Oral Q6H PRN [name], MD       Or  . ondansetron (ZOFRAN) injection 4 mg  4 mg Intravenous Q6H PRN [name], MD      . potassium chloride SA (K-DUR,KLOR-CON) CR tablet 40 mEq  40 mEq Oral Once [employer], DO   40 mEq at 05/25/14 1255  . senna-docusate (Senokot-S) tablet 1 tablet  1 tablet Oral QHS PRN Knox Royalty, NP      . sertraline (ZOLOFT) tablet 200 mg  200 mg Oral QPM [name], MD   200 mg at 05/27/14 1828  . thiothixene (NAVANE) capsule 2 mg  2 mg Oral QHS [name], MD   2 mg at 05/27/14 2219    Musculoskeletal: Strength & Muscle Tone: decreased Gait & Station: unable to stand Patient leans: N/A  Psychiatric Specialty Exam: Physical Exam as per history and  physical  ROS depression, anxiety, insomnia, upset and angry  Blood pressure 141/73, pulse 86, temperature 99.4 F (37.4 C), temperature source Oral, resp. rate 18, height 4\' 10"  (1.473 m), weight 75.297 kg (166 lb), SpO2 96 %.Body mass index is 34.7 kg/(m^2).  General Appearance: Guarded  Eye Contact::  Good  Speech:  Clear and Coherent  Volume:  Normal  Mood:  Angry, Anxious and Irritable  Affect:  Labile  Thought Process:  Irrelevant and Tangential  Orientation:  Full (Time, Place, and Person)  Thought Content:  Rumination  Suicidal Thoughts:  No  Homicidal Thoughts:  No  Memory:  Immediate;   Fair Recent;   Fair  Judgement:  Impaired  Insight:  Shallow  Psychomotor Activity:  Decreased  Concentration:  Fair  Recall:  [place] of Knowledge:Good  Language: Good  Akathisia:  Negative  Handed:  Right  AIMS (if indicated):     Assets:  Communication Skills Desire for Improvement Financial Resources/Insurance Housing Intimacy Leisure Time Resilience Social Support  ADL's:  Impaired  Cognition: Impaired,  Mild  Sleep:      Medical Decision Making: Review of Psycho-Social Stressors (1), Review or order clinical lab tests (1), Established Problem, Worsening (2), New Problem, with no additional work-up planned (3), Review or order medicine tests (1), Review of Medication Regimen & Side Effects (2) and Review of New Medication or Change in Dosage (2)  Treatment Plan Summary: Daily contact with patient to assess and evaluate symptoms and progress in treatment and Medication management  Plan: Discontinue Navane 2 mg Qhs due to not helpful at this time Will add Abilify 5 mg PO BID for mood swings, labile affect and agitation Patient does not meet criteria for psychiatric inpatient admission. Supportive therapy provided about ongoing stressors.  Appreciate psychiatric consultation and follow up as clinically required Please contact [phone] or [phone] if needs further  assistance  Disposition: may be discharged to out patient care when medically stable.  [place] Mccuen,JANARDHAHA R. 05/28/2014 10:05 AM

## 2014-05-29 ENCOUNTER — Ambulatory Visit
Admit: 2014-05-29 | Discharge: 2014-05-29 | Disposition: A | Discharge status: Home / Self Care | Payer: Medicaid Other | Attending: Radiation Oncology | Admitting: Radiation Oncology

## 2014-05-29 DIAGNOSIS — F4323 Adjustment disorder with mixed anxiety and depressed mood: Secondary | ICD-10-CM

## 2014-05-29 MED ORDER — LOPERAMIDE HCL 2 MG PO CAPS
2.0000 mg | ORAL_CAPSULE | Freq: Once | ORAL | Status: AC
  Administered 2014-05-29: 2 mg via ORAL
  Filled 2014-05-29: qty 1

## 2014-05-29 MED ORDER — LOPERAMIDE HCL 2 MG PO CAPS
2.0000 mg | ORAL_CAPSULE | ORAL | Status: DC | PRN
  Administered 2014-05-29: 2 mg via ORAL
  Filled 2014-05-29: qty 1

## 2014-05-29 NOTE — Progress Notes (Signed)
[facility]                                                                              Patient Demographics  [name], is a 60 y.o. female, DOB - 11/15/1955, KDT:[id number]  Admit date - 05/22/2014   Admitting Physician [name], MD  Outpatient Primary MD for the patient is [name], FNP  LOS - 6   Chief Complaint  Patient presents with  . Abdominal Pain  . Nausea  . Emesis  . Constipation      HPI on 05/22/2014 by Dr. [name] Patient with complex medical hx. extensive history of noncompliance and drug seeking behavior and polysubstance abuse. 2013 patient was found to have early stage endometrial cancer. 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 Patient did not follow up with gyn oncology.  In 2015 she presented with vaginal bleeding, pelvic pain and discharge. 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  CT at that time showed new lobulated soft tissue mass involving vaginal cuff and posterior wall of the urinary bladder, with areas of internal necrosis, measuring 3.7 x 6.9 cm; there was also new pelvic adenopathy at iliac bifurcations bilaterally, but no obvious disease outside of pelvis, including nothing involving bowels, and no hydronephrosis. Patient had an admission for diverticulitis in October 2015 and ended time was able to be seen in-house by radiation oncology and oncology which point palliative radiation therapy has been started. Patient has been going to her treatments intermittently. She has completed only about 1.5 [name] has 6 treatments. Radiation oncologist started her on Dilaudid 4 mg as needed for pain. She is supposed to follow-up with pain clinic but canceled her appointment. Patient continued to  have worsening abdominal pain but started to have stabbing pain in her vaginal and rectal area. She have had frequent urination in seen pus from her rectal area. She has been intermittently tearful. Reports low-grade fevers. Today she was seen again by radiation oncology. Who has discussed potential hospice or palliative care evaluation. Patient was recommended to present to emergency department so that the father workup could be done. Patient currently resides with her 60 year old mother. Scan today showed progression of her tumor burden 7 .4 cm necrotic pelvic mass arising from the vaginal cuff, Increased. 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 Suspected involvement of the bladder. Moderate right hydroureteronephrosis, likely reflecting extrinsic compression by tumor. Unfortunately patient and the family have very poor insight. Patient has been verbally abusive to the staff and her family. At this point she is concentrated on pain management. Neither patient nor her family at this point is ready to have a conversation about her prognosis stating that "she'll be just fine". Unable to finish interview due to patient becoming agitated and screaming out.  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 UA showed evidence of UTI/ pateint with elevated WBC. Transiently tachycardic. NO nausea or vomiting. She refuses trial of PO pain medications and threatens to leave AMA.  Interim history -Patient continues to need IV pain medication. She will complete another 7 radiation treatments.  Assessment & Plan   Sepsis secondary to UTI -Upon admission, patient was febrile, tachycardic, with leukocytosis -Leukocytosis resolved, vitals signs resolved.  -UA: WBC TNTC, moderate leukocytes, many bacteria -Urine culture from 2/25 not found; urine culture 05/25/2014 shows no growth -blood cultures show no growth to  date -Initially placed on zosyn- discontinued 05/27/2014 -Remains hemodynamically stable, afebrile  Metastatic Endometrial cancer with hydronephrosis/ Cancer associated pain -CT abdomen and pelvis shows 7.4 necrotic pelvic mass arising in the vaginal cuff, increased. 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 [name] (GynOnc) who spoke with Dr. [name] (RadOnc) and recommended patient have 10 days of palliative radiation during hospitalization as she supposedly could not make it to her appointments. -Patient has a very unfortunate social situation.  Social work consulted and assisting. -Palliative care consult and appreciated- changed her medications to Dialudid IV 1mg  q2h and PO 2mg  q3h PRN -Continuously asks for more IV pain medication.  There is no doubt that patient has pain, however at the time of examination, she appears comfortable and falls asleep easily.  Repeatedly explained to patient that there are too many side effects with increased pain medications, including decreased respiratory drive.  -Case was discussed with palliative care today regarding transition to home hospice. Pain symptoms have been difficult to control and may need IV narcotic analgesics at home for which PICC line has been ordered.   Essential hypertension -Currently stable, on no home medications -Will continue to monitor  Hypothyroidism -TSH 22.742 (wonder if patient was taking her synthroid) -Continue Synthroid- increased dose to 123mcg  daily  Anxiety/Depression/Psychosis -Continue Ativan, sertraline, thiothixene -Consulted psychiatry for medication assistance- consultation recommendations pending  Asthma -Currently stable, continue albuterol PRN  Hypokalemia -Will replace and continue to monitor.   Code Status: Full  Family Communication: Mother at bedside  Disposition Plan: Admitted, Continue palliative radiation- 7 more treatments which occur M-F  Time Spent in minutes   20 minutes  Procedures  Palliative Radiation   Consults   Oncology Radiation oncology [facility], via phone Palliative care  DVT Prophylaxis  lovenox  Lab Results  Component Value Date   PLT 241 05/28/2014    Medications  Scheduled Meds: . ARIPiprazole  5 mg Oral BID  . docusate sodium  100 mg Oral BID  . enoxaparin (LOVENOX) injection  40 mg Subcutaneous Q24H  . levothyroxine  137 mcg Oral QAC breakfast  . LORazepam  1 mg Oral BID  . potassium chloride  40 mEq Oral Once  . sertraline  200 mg Oral QPM   Continuous Infusions:  PRN Meds:.acetaminophen **OR** acetaminophen, albuterol, HYDROcodone-acetaminophen, HYDROmorphone (DILAUDID) injection, HYDROmorphone, loperamide, ondansetron **OR** ondansetron (ZOFRAN) IV, senna-docusate  Antibiotics    Anti-infectives    Start     Dose/Rate Route Frequency Ordered Stop   05/25/14 1931  piperacillin-tazobactam (ZOSYN) IVPB 3.375 g  Status:  Discontinued     3.375 g 100 mL/hr over 30 Minutes Intravenous 3 times per day 05/25/14 1931 05/25/14 1935   05/23/14 0830  piperacillin-tazobactam (ZOSYN)  IVPB 3.375 g  Status:  Discontinued     3.375 g 12.5 mL/hr over 240 Minutes Intravenous Every 8 hours 05/23/14 0804 05/27/14 1211   05/22/14 2215  cefTRIAXone (ROCEPHIN) 1 g in dextrose 5 % 50 mL IVPB     1 g 100 mL/hr over 30 Minutes Intravenous  Once 05/22/14 2209 05/22/14 2342        Subjective:   Caren Griffins Veneziano seen and examined today.  Patient states she  continues to be in pain. She states that last night, she thought was a nurse was being mean to her. Patient continuously assess for increase in her Dilaudid. She states she did not know that she had oral Dilaudid ordered. Patient has no specific complaints at this time.  Objective:   Filed Vitals:   05/28/14 1440 05/28/14 2140 05/29/14 0629 05/29/14 1347  BP: 121/74 117/54 122/73 100/63  Pulse: 77 83 92 86  Temp: 98.4 F (36.9 C) 99.9 F (37.7 C) 99.1 F (37.3 C) 99.5 F (37.5 C)  TempSrc: Oral Oral Oral Oral  Resp: 18 16 16 16   Height:      Weight:      SpO2: 97% 97% 96% 96%    Wt Readings from Last 3 Encounters:  05/24/14 75.297 kg (166 lb)  01/25/14 75.569 kg (166 lb 9.6 oz)  01/06/14 79.243 kg (174 lb 11.2 oz)     Intake/Output Summary (Last 24 hours) at 05/29/14 1621 Last data filed at 05/28/14 1900  Gross per 24 hour  Intake    120 ml  Output      0 ml  Net    120 ml    Exam  General: Well developed, NAD  Cardiovascular: S1 S2 auscultated, RRR  Respiratory: Clear auscultation bilaterally  Abdomen: Soft, obese, diffuse tenderness, positive bowel sounds  Extremities: No clubbing cyanosis or edema  Neuro: Awake and oriented.  Gait slow but normal.  Nonfocal  Psych: Depressed/flat   Data Review   Micro Results Recent Results (from the past 240 hour(s))  MRSA PCR Screening     Status: None   Collection Time: 05/23/14  2:55 AM  Result Value Ref Range Status   MRSA by PCR NEGATIVE NEGATIVE Final    Comment:        The GeneXpert MRSA Assay (FDA approved for NASAL specimens only), is one component of a comprehensive MRSA colonization surveillance program. It is not intended to diagnose MRSA infection nor to guide or monitor treatment for MRSA infections.   Culture, blood (routine x 2)     Status: None (Preliminary result)   Collection Time: 05/23/14 11:05 PM  Result Value Ref Range Status   Specimen Description BLOOD RIGHT ARM  Final   Special  Requests BOTTLES DRAWN AEROBIC ONLY 10CC  Final   Culture   Final           BLOOD CULTURE RECEIVED NO GROWTH TO DATE CULTURE WILL BE HELD FOR 5 DAYS BEFORE ISSUING A FINAL NEGATIVE REPORT Performed at [facility]    Report Status PENDING  Incomplete  Culture, blood (routine x 2)     Status: None (Preliminary result)   Collection Time: 05/23/14 11:10 PM  Result Value Ref Range Status   Specimen Description BLOOD RIGHT HAND  Final   Special Requests BOTTLES DRAWN AEROBIC ONLY [place]  Final   Culture   Final           BLOOD CULTURE RECEIVED NO GROWTH TO DATE CULTURE WILL BE HELD  FOR 5 DAYS BEFORE ISSUING A FINAL NEGATIVE REPORT Performed at [facility]    Report Status PENDING  Incomplete  Culture, Urine     Status: None   Collection Time: 05/25/14 12:30 PM  Result Value Ref Range Status   Specimen Description URINE, CLEAN CATCH  Final   Special Requests NONE  Final   Colony Count NO GROWTH Performed at [facility]   Final   Culture NO GROWTH Performed at [facility]   Final   Report Status 05/27/2014 FINAL  Final  Clostridium Difficile by PCR     Status: None   Collection Time: 05/28/14 10:11 PM  Result Value Ref Range Status   C difficile by pcr NEGATIVE NEGATIVE Final    Radiology Reports Ct Abdomen Pelvis W Contrast  05/22/2014   CLINICAL DATA:  Endometrial cancer, nausea, pelvic drainage  EXAM: CT ABDOMEN AND PELVIS WITH CONTRAST  TECHNIQUE: Multidetector CT imaging of the abdomen and pelvis was performed using the standard protocol following bolus administration of intravenous contrast.  CONTRAST:  122mL OMNIPAQUE IOHEXOL 300 MG/ML SOLN, 84mL OMNIPAQUE IOHEXOL 300 MG/ML SOLN  COMPARISON:  03/06/2014  FINDINGS: Lower chest:  Lung bases are clear.  Hepatobiliary: Liver is within normal limits.  Gallbladder is unremarkable. No intrahepatic or extrahepatic ductal dilatation.  Pancreas: Within normal limits.  Spleen: Splenomegaly, measuring 15.5 cm  in maximal dimension.  Adrenals/Urinary Tract: Adrenal glands are within normal limits.  Left kidney is within normal limits.  Malrotated right kidney. Moderate hydroureteronephrosis, likely secondary to extrinsic compression.  Thick-walled bladder.  Stomach/Bowel: Stomach is within normal limits.  No evidence of bowel obstruction.  Appendix is not discretely visualized and may be surgically absent.  Moderate colonic stool burden, suggesting constipation.  Sigmoid colon is thick-walled and likely involved via the pelvic mass (described below).  Mass like thickening involving the rectum (series 2/image 57), new.  Vascular/Lymphatic: No evidence of abdominal aortic aneurysm.  Small subcentimeter retroperitoneal nodes, indeterminate.  Scattered nodes along the sigmoid mesocolon measuring up to 10 mm (series 2/image 52), along with bilateral internal iliac nodes measuring 19-20 mm (series 2/image 57), compatible with nodal metastases.  Reproductive: Status post hysterectomy.  7.4 x 5.7 x 6.1 cm necrotic pelvic mass (series 2/image 62), likely arising from the vaginal cuff, previously 3.2 x 5.6 cm. Gas within the lesion suggest direct communication with the sigmoid colon/ rectum. Additionally, the mass abuts the posterior wall of the bladder, which is also likely involved.  Other: No abdominopelvic ascites.  Musculoskeletal: Degenerative changes of the thoracolumbar spine, most prominent at L5-S1.  IMPRESSION: 7.4 cm necrotic pelvic mass arising from the vaginal cuff, increased.  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 Suspected involvement of the bladder.  Associated pelvic nodal metastases.  Moderate right hydroureteronephrosis, likely reflecting extrinsic compression by tumor.   Electronically Signed   By: Julian Hy M.D.   On: 05/22/2014 21:59    CBC  Recent Labs Lab 05/22/14 1823 05/23/14 0605 05/24/14 0530 05/25/14 0506 05/27/14 0500 05/28/14 0544  WBC 13.4*  12.6* 13.3* 10.6* 9.9 9.0  HGB 10.5* 9.7* 9.8* 9.5* 9.5* 9.3*  HCT 32.5* 29.4* 29.6* 29.2* 29.3* 28.7*  PLT 220 206 215 216 227 241  MCV 86.4 85.7 86.5 86.9 87.5 87.8  MCH 27.9 28.3 28.7 28.3 28.4 28.4  MCHC 32.3 33.0 33.1 32.5 32.4 32.4  RDW 12.9 12.9 13.0 13.0 13.3 13.5  LYMPHSABS 1.4  --   --   --   --   --  MONOABS 1.2*  --   --   --   --   --   EOSABS 0.5  --   --   --   --   --   BASOSABS 0.0  --   --   --   --   --     Chemistries   Recent Labs Lab 05/22/14 1823 05/23/14 0605 05/24/14 0530 05/25/14 0506 05/27/14 0500 05/28/14 0544  NA 136 131* 137 139 138 139  K 4.0 3.4* 3.6 3.4* 3.0* 3.2*  CL 103 101 103 106 104 106  CO2 24 22 23 25 25 25   GLUCOSE 107* 96 93 96 99 92  BUN 11 10 10 10 8 8   CREATININE 1.03 0.99 1.13* 0.98 0.94 0.81  CALCIUM 9.0 8.3* 8.7 8.3* 8.6 8.4  MG  --  2.0  --   --   --   --   AST 19 20  --   --   --   --   ALT 15 13  --   --   --   --   ALKPHOS 61 57  --   --   --   --   BILITOT 0.5 0.6  --   --   --   --    ------------------------------------------------------------------------------------------------------------------ estimated creatinine clearance is 64.6 mL/min (by C-G formula based on Cr of 0.81). ------------------------------------------------------------------------------------------------------------------ No results for input(s): HGBA1C in the last 72 hours. ------------------------------------------------------------------------------------------------------------------ No results for input(s): CHOL, HDL, LDLCALC, TRIG, CHOLHDL, LDLDIRECT in the last 72 hours. ------------------------------------------------------------------------------------------------------------------ No results for input(s): TSH, T4TOTAL, T3FREE, THYROIDAB in the last 72 hours.  Invalid input(s): FREET3 ------------------------------------------------------------------------------------------------------------------ No results for input(s): VITAMINB12,  FOLATE, FERRITIN, TIBC, IRON, RETICCTPCT in the last 72 hours.  Coagulation profile No results for input(s): INR, PROTIME in the last 168 hours.  No results for input(s): DDIMER in the last 72 hours.  Cardiac Enzymes No results for input(s): CKMB, TROPONINI, MYOGLOBIN in the last 168 hours.  Invalid input(s): CK ------------------------------------------------------------------------------------------------------------------ Invalid input(s): POCBNP    Margia Wiesen D.O. on 05/29/2014 at 4:21 PM  Between 7am to 7pm - Pager - [phone]  After 7pm go to [email or web address] - password [email or web address]  And look for the night coverage person covering for me after hours  [facility] Office  [phone]

## 2014-05-29 NOTE — Consult Note (Signed)
Psychiatry Consult follow up  Reason for Consult:  Medication management for depression and agitation Referring Physician:  Dr. Ree Kida  Patient Identification: [name] MRN:  [id number] Principal Diagnosis: Adjustment disorder with mixed anxiety and depressed mood Diagnosis:   Patient Active Problem List   Diagnosis Date Noted  . Adjustment disorder with mixed anxiety and depressed mood [F43.23] 05/28/2014  . Pelvic mass [R19.00]   . DNR (do not resuscitate) discussion [Z71.89]   . Palliative care encounter [Z51.5]   . 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 Sepsis [A41.9] 05/23/2014  . Anxiety state [F41.1] 01/07/2014  . Behavior disturbance [F91.9] 01/07/2014  . Diverticulitis [K57.92] 01/06/2014  . Depression [F32.9] 10/12/2011  . Endometrial ca [C54.1] 07/21/2011  . Hypothyroidism [E03.9] 11/29/2006  . Essential hypertension [I10] 11/29/2006  . 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 SPINAL STENOSIS [M48.00] 11/29/2006    Total Time spent with patient: 20 minutes  Subjective:   [name] is a 59 y.o. female patient admitted with depression and agitation.  HPI:  AALIAYAH Mcdaniel is a 59 y.o. female seen along with case manager and reviewed chart for psychiatric consultation and evaluation of increased agitation and verbal aggression towards staff in hospital. Patient is known to psych consultation team from her previous admission to medical floor. Patient blames the hospital staff for not responding to her needs of controlling pain and not providing drink when she was thirsty. Patient states that she has been getting frustrated, upset and angry which she has expressed at home with her mother. Patient states that she needs pain control every minute due to her cancer and starting of radiation therapy. Patient responded well to Abilify during last visit and will give a trail starting today and will reevaluate tomorrow. She denied current suicide or homicide  ideation, intention or plans. She has no evidence of psychosis. She has extensive history of noncompliance and drug seeking behavior and polysubstance abuse. She denied current drug abuse and alcohol. She was seen a psychiatrist at [facility]" for depression and anxiety over five years.   Interval History: Patient seen with case manager, patient mother is at bed side. Patient has no complaints today, states that she feels like sleeping and does not want to talk much today. She has been tolerating her medication change with mild sedation. Patient mother seems like confused and states that her daughter wants to go back to hospital and she is packing the cloths while she is in hospital. Patient asked her mother not to talk and let the treatment team to come back later. Staff RN reported that patient behaviors is some what better and has occasional verbal agitation.  Medical History: Patient has a past medical history of Thyroid disease; Arthritis; Asthma; Depression; Fibromyalgia; Lumbar spondylolysis; Allergy; Endometrial cancer; Spinal stenosis; Hypertension; Dysrhythmia; Sleep apnea; Anxiety; Back pain, chronic; and Obese.   Patient with complex medical hx. extensive history of noncompliance and drug seeking behavior and polysubstance abuse. 2013 patient was found to have early stage endometrial cancer. 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 Patient did not follow up with gyn oncology. In 2015 she presented with vaginal bleeding, pelvic pain and discharge. 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 CT at that time showed new lobulated soft tissue mass involving vaginal cuff and posterior wall of the urinary bladder,  with areas of internal necrosis, measuring 3.7 x  6.9 cm; there was also new pelvic adenopathy at iliac bifurcations bilaterally, but no obvious disease outside of pelvis, including nothing involving bowels, and no hydronephrosis. Patient had an admission for diverticulitis in October 2015 and ended time was able to be seen in-house by radiation oncology and oncology which point palliative radiation therapy has been started. Patient has been going to her treatments intermittently. She has completed only about 1.5 [name] has 6 treatments. Radiation oncologist started her on Dilaudid 4 mg as needed for pain. She is supposed to follow-up with pain clinic but canceled her appointment. Patient continued to have worsening abdominal pain but started to have stabbing pain in her vaginal and rectal area. She have had frequent urination in seen pus from her rectal area. She has been intermittently tearful. Reports low-grade fevers. Today she was seen again by radiation oncology. Who has discussed potential hospice or palliative care evaluation. Patient was recommended to present to emergency department so that the father workup could be done. Patient currently resides with her 31 year old mother.   Scan today showed progression of her tumor burden 7 .4 cm necrotic pelvic mass arising from the vaginal cuff, Increased. 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 Suspected involvement of the bladder. Moderate right hydroureteronephrosis, likely reflecting extrinsic compression by tumor. Unfortunately patient and the family have very poor insight. Patient has been verbally abusive to the staff and her family. At this point she is concentrated on pain management. Neither patient nor her family at this point is ready to have a conversation about her prognosis stating that "she'll be just fine". Unable to finish interview due to patient becoming agitated and screaming out. 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 UA showed evidence of UTI/ pateint with elevated WBC. Transiently tachycardic. NO nausea or vomiting. She refuses trial of PO pain medications and threatens to leave AMA.   Review of Systems:  Unable to obtain as patient being aggitated  HPI Elements:   Past Medical History:  Past Medical History  Diagnosis Date  . Thyroid disease   . Arthritis   . Asthma   . Depression   . Fibromyalgia   . Lumbar spondylolysis   . Allergy   . Endometrial cancer   . Spinal stenosis   . Hypertension   . Dysrhythmia     OCCAS PAC'S AND PVC'S  PER HOLTER MONITOR STUDY --PT HAS OCCAS CHEST PAINS AND PALPITATIONS--HAD ECHO NORMAL LV SIZE AND FUNCTION.  Marland Kitchen Sleep apnea     HAS CPAP MACHINE BUT DOES NOT USE  . Anxiety     HAS OCD AND ON DISABILITY-PER [name]   . Back pain, chronic   . Obese     Past Surgical History  Procedure Laterality Date  . Cesarean section    . Abdominal hysterectomy      total   Family History:  Family History  Problem Relation Age of Onset  . Hypertension Mother   . Coronary artery disease Father   . Cancer Brother     Renal cell carcinoma  . Hypertension Other   . Cancer Other   . Heart attack Father   . Coronary artery disease Brother    Social History:  History  Alcohol Use No     History  Drug Use No    History   Social History  . Marital Status: Single    Spouse  Name: N/A  . Number of Children: 1  . Years of Education: 12th   Occupational History  . disabled    Social History Main Topics  . Smoking status: Never Smoker   . Smokeless tobacco: Never Used  . Alcohol Use: No  . Drug Use: No  . Sexual Activity: Not on file   Other Topics Concern  . None   Social History Narrative    Allergies:   Allergies  Allergen Reactions  . Sulfa Antibiotics Other (See Comments)    Unknown-pt states she is unsure of what her reaction is  . Sulfamethoxazole-Trimethoprim Other (See Comments)     Unknown reaction    Vitals: Blood pressure 122/73, pulse 92, temperature 99.1 F (37.3 C), temperature source Oral, resp. rate 16, height 4\' 10"  (1.473 m), weight 75.297 kg (166 lb), SpO2 96 %.  Risk to Self: Is patient at risk for suicide?: No Risk to Others:   Prior Inpatient Therapy:   Prior Outpatient Therapy:    Current Facility-Administered Medications  Medication Dose Route Frequency Provider Last Rate Last Dose  . acetaminophen (TYLENOL) tablet 650 mg  650 mg Oral Q6H PRN [name], MD   650 mg at 05/23/14 7846   Or  . acetaminophen (TYLENOL) suppository 650 mg  650 mg Rectal Q6H PRN [name], MD      . albuterol (PROVENTIL) (2.5 MG/3ML) 0.083% nebulizer solution 3 mL  3 mL Inhalation Q4H PRN [name], MD      . ARIPiprazole (ABILIFY) tablet 5 mg  5 mg Oral BID Durward Parcel, MD   5 mg at 05/29/14 0755  . docusate sodium (COLACE) capsule 100 mg  100 mg Oral BID [name], MD   100 mg at 05/28/14 1035  . enoxaparin (LOVENOX) injection 40 mg  40 mg Subcutaneous Q24H [name], MD   40 mg at 05/29/14 0925  . HYDROcodone-acetaminophen (NORCO/VICODIN) 5-325 MG per tablet 1-2 tablet  1-2 tablet Oral Q4H PRN Knox Royalty, NP   2 tablet at 05/25/14 2139  . HYDROmorphone (DILAUDID) injection 1 mg  1 mg Intravenous Q2H PRN Knox Royalty, NP   1 mg at 05/29/14 0925  . HYDROmorphone (DILAUDID) tablet 2 mg  2 mg Oral Q3H PRN Knox Royalty, NP   2 mg at 05/26/14 1829  . levothyroxine (SYNTHROID, LEVOTHROID) tablet 137 mcg  137 mcg Oral QAC breakfast Maryann Mikhail, DO   137 mcg at 05/29/14 9629  . loperamide (IMODIUM) capsule 2 mg  2 mg Oral PRN Kelvin Cellar, MD   2 mg at 05/29/14 0926  . LORazepam (ATIVAN) tablet 1 mg  1 mg Oral BID [name], MD   1 mg at 05/29/14 0926  . ondansetron (ZOFRAN) tablet 4 mg  4 mg Oral Q6H PRN [name], MD       Or  . ondansetron (ZOFRAN) injection 4 mg  4 mg Intravenous Q6H PRN  [name], MD      . potassium chloride SA (K-DUR,KLOR-CON) CR tablet 40 mEq  40 mEq Oral Once [employer], DO   40 mEq at 05/25/14 1255  . senna-docusate (Senokot-S) tablet 1 tablet  1 tablet Oral QHS PRN Knox Royalty, NP      . sertraline (ZOLOFT) tablet 200 mg  200 mg Oral QPM [name], MD   200 mg at 05/28/14 1846    Musculoskeletal: Strength & Muscle Tone: decreased Gait & Station: unable to stand Patient leans: N/A  Psychiatric Specialty  Exam: Physical Exam as per history and physical  ROS depression, anxiety, insomnia, upset and angry  Blood pressure 122/73, pulse 92, temperature 99.1 F (37.3 C), temperature source Oral, resp. rate 16, height 4\' 10"  (1.473 m), weight 75.297 kg (166 lb), SpO2 96 %.Body mass index is 34.7 kg/(m^2).  General Appearance: Guarded  Eye Contact::  Good  Speech:  Clear and Coherent  Volume:  Normal  Mood:  Anxious and Depressed  Affect:  Constricted  Thought Process:  Coherent and Intact  Orientation:  Full (Time, Place, and Person)  Thought Content:  Rumination  Suicidal Thoughts:  No  Homicidal Thoughts:  No  Memory:  Immediate;   Fair Recent;   Fair  Judgement:  Impaired  Insight:  Shallow  Psychomotor Activity:  Decreased  Concentration:  Fair  Recall:  [place] of Knowledge:Good  Language: Good  Akathisia:  Negative  Handed:  Right  AIMS (if indicated):     Assets:  Communication Skills Desire for Improvement Financial Resources/Insurance Housing Intimacy Leisure Time Resilience Social Support  ADL's:  Impaired  Cognition: Impaired,  Mild  Sleep:      Medical Decision Making: Review of Psycho-Social Stressors (1), Review or order clinical lab tests (1), Established Problem, Worsening (2), New Problem, with no additional work-up planned (3), Review or order medicine tests (1), Review of Medication Regimen & Side Effects (2) and Review of New Medication or Change in Dosage (2)  Treatment Plan  Summary: Daily contact with patient to assess and evaluate symptoms and progress in treatment and Medication management  Plan: Continue Abilify 5 mg PO BID for mood swings, labile affect and agitation Patient does not meet criteria for psychiatric inpatient admission. Supportive therapy provided about ongoing stressors.  Appreciate psychiatric consultation and follow up as clinically required Please contact [phone] or [phone] if needs further assistance  Disposition: may be discharged to out patient care when medically stable.  Nero Sawatzky,JANARDHAHA R. 05/29/2014 11:12 AM

## 2014-05-29 NOTE — Progress Notes (Signed)
Pt c/o increased pain in abdomen, radiating to chest and making it difficult to walk while ambulating to bathroom. Pt crying, and yelling, "I need a CT now, something is wrong with my abdomen." Pt given PRN dilaudid. Pt states new pain in chest and legs not present now. Imodium given for diarrhea since C Diff PCR was negative. 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 They need to do something about this now." Pt assured that attending MD will be present early in the morning and they will decide if additional scans are necessary. Pt began to cry and scream at this writer. Pt reassured that MDs aware of fistula and will see pt in a few hours.

## 2014-05-29 NOTE — Progress Notes (Signed)
Spoke with Dr Coralyn Pear about placing PICC line for discharge and concerns for patients safety, ability to care for self and documented noncompliance and polysubstance abuse. He verbalized the patients need for PICC and wishes for PICC placement orders to be followed.

## 2014-05-29 NOTE — Progress Notes (Signed)
Spent 50 minutes at the bedside trying to explain PICC insertion, risks, benefits and alternatives without success.  Patient repeatedly became agitated and verbally aggressive. When trying to discuss the PICC line, she would continually turn the conversation back to the topic of inefficient pain control and demanded promises for medication that I could not give her. She ended the session multiple times, only to say "go on, go on,"  but would again yell and become aggressive. She and her mother verbalize concerns about inability to take care of the PICC line at home and need for continuous nursing care, yet  became very angry with the mention of hospice and/or SNF as a possibility to help meet her needs. For these reasons I was unable to obtain PICC consent or place the PICC line.

## 2014-05-29 NOTE — Progress Notes (Signed)
Stopped by to visit w/pt; she was resting and barely opened her eyes. A lady (perhaps her mother) was bedside. I told them I would try and return the visit. Ernest Haber Chaplain   05/29/14 1000  Clinical Encounter Type  Visited With Patient and family together

## 2014-05-29 NOTE — Progress Notes (Signed)
At bedside to place PICC, pt requests I come back later. Will return.

## 2014-05-29 NOTE — Progress Notes (Signed)
CSW spoke with bedside RN who reported that Pt had been verbally aggressive over night but was not combative. Pt agreed to take all medications.  CSW attempted to meet with Pt along with psych MD. Pt was lethargic and reported that she did not want to speak with anyone at this time. CSW and psych MD stated that they would return later.   Peri Maris, [place] 05/29/2014 11:47 AM

## 2014-05-29 NOTE — Progress Notes (Signed)
Pt complaining I'm not pushing her pain medicine fast enough through her IV,states it's not working. Angry I'm not doing it right.

## 2014-05-30 ENCOUNTER — Ambulatory Visit
Admit: 2014-05-30 | Discharge: 2014-05-30 | Disposition: A | Discharge status: Home / Self Care | Payer: Medicaid Other | Attending: Radiation Oncology | Admitting: Radiation Oncology

## 2014-05-30 LAB — CULTURE, BLOOD (ROUTINE X 2)
Culture: NO GROWTH
Culture: NO GROWTH

## 2014-05-30 MED ORDER — MORPHINE SULFATE ER 30 MG PO TBCR
30.0000 mg | EXTENDED_RELEASE_TABLET | Freq: Two times a day (BID) | ORAL | Status: DC
Start: 2014-05-30 — End: 2014-06-04
  Administered 2014-05-30 – 2014-06-04 (×9): 30 mg via ORAL
  Filled 2014-05-30 (×11): qty 1

## 2014-05-30 NOTE — Progress Notes (Signed)
[facility]                                                                              Patient Demographics  [name], is a 59 y.o. female, DOB - 1955-07-06, STM:[id number]  Admit date - 05/22/2014   Admitting Physician [name], MD  Outpatient Primary MD for the patient is [name], FNP  LOS - 7   Chief Complaint  Patient presents with  . Abdominal Pain  . Nausea  . Emesis  . Constipation      HPI on 05/22/2014 by Dr. [name] Patient with complex medical hx. extensive history of noncompliance and drug seeking behavior and polysubstance abuse. 2013 patient was found to have early stage endometrial cancer. 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 Patient did not follow up with gyn oncology.  In 2015 she presented with vaginal bleeding, pelvic pain and discharge. 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  CT at that time showed new lobulated soft tissue mass involving vaginal cuff and posterior wall of the urinary bladder, with areas of internal necrosis, measuring 3.7 x 6.9 cm; there was also new pelvic adenopathy at iliac bifurcations bilaterally, but no obvious disease outside of pelvis, including nothing involving bowels, and no hydronephrosis. Patient had an admission for diverticulitis in October 2015 and ended time was able to be seen in-house by radiation oncology and oncology which point palliative radiation therapy has been started. Patient has been going to her treatments intermittently. She has completed only about 1.5 [name] has 6 treatments. Radiation oncologist started her on Dilaudid 4 mg as needed for pain. She is supposed to follow-up with pain clinic but canceled her appointment. Patient continued to  have worsening abdominal pain but started to have stabbing pain in her vaginal and rectal area. She have had frequent urination in seen pus from her rectal area. She has been intermittently tearful. Reports low-grade fevers. Today she was seen again by radiation oncology. Who has discussed potential hospice or palliative care evaluation. Patient was recommended to present to emergency department so that the father workup could be done. Patient currently resides with her 38 year old mother. Scan today showed progression of her tumor burden 7 .4 cm necrotic pelvic mass arising from the vaginal cuff, Increased. 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 Suspected involvement of the bladder. Moderate right hydroureteronephrosis, likely reflecting extrinsic compression by tumor. Unfortunately patient and the family have very poor insight. Patient has been verbally abusive to the staff and her family. At this point she is concentrated on pain management. Neither patient nor her family at this point is ready to have a conversation about her prognosis stating that "she'll be just fine". Unable to finish interview due to patient becoming agitated and screaming out.  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 UA showed evidence of UTI/ pateint with elevated WBC. Transiently tachycardic. NO nausea or vomiting. She refuses trial of PO pain medications and threatens to leave AMA.  Interim history -Patient continues to need IV pain medication. She will complete another 7 radiation treatments.  Assessment & Plan   Sepsis secondary to UTI -Urine culture from 2/25 not found; urine culture 05/25/2014 shows no growth -blood cultures show no growth to date -Initially placed on zosyn- discontinued 05/27/2014 -remains stable  Metastatic Endometrial cancer with hydronephrosis/ Cancer associated pain -CT abdomen and  pelvis shows 7.4 necrotic pelvic mass arising in the vaginal cuff, increased. 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 [name] (GynOnc) who spoke with Dr. [name] (RadOnc) and recommended patient have 10 days of palliative radiation during hospitalization as she supposedly could not make it to her appointments. -Patient has a very unfortunate social situation.  Social work consulted and assisting. -Palliative care consult and appreciated- changed her medications to Dialudid IV 1mg  q2h and PO 2mg  q3h PRN -Continuously asks for more IV pain medication.  There is no doubt that patient has pain, however at the time of examination, she appears comfortable and falls asleep easily.  Repeatedly explained to patient that there are too many side effects with increased pain medications, including decreased respiratory drive.  -Case was discussed with palliative care today regarding transition to home hospice. Pain symptoms have been difficult to control and may need IV narcotic analgesics at home for which PICC line has been ordered.  -Patient refusing to sign consent for PICC line on 05/29/2014, I discussed this further with Palliative Care. Will discuss goals of care further today, including topic of transitioning to hospice care.   Essential hypertension -Currently stable, on no home medications -Will continue to monitor  Hypothyroidism -TSH 22.742 (wonder if patient was taking her synthroid) -Continue Synthroid- increased dose to 163mcg  daily  Anxiety/Depression/Psychosis -Continue Ativan, sertraline, thiothixene -Consulted psychiatry for medication assistance- consultation recommendations pending  Asthma -Currently stable, continue albuterol PRN  Hypokalemia -Will replace and continue to monitor.   Code Status: Full  Family Communication: Mother at bedside  Disposition Plan: Admitted, Continue palliative radiation- 7 more treatments which occur M-F  Time Spent in minutes   20 minutes  Procedures  Palliative Radiation   Consults   Oncology Radiation oncology [facility], via phone Palliative care  DVT Prophylaxis  lovenox  Lab Results  Component Value Date   PLT 241 05/28/2014    Medications  Scheduled Meds: . ARIPiprazole  5 mg Oral BID  . docusate sodium  100 mg Oral BID  . enoxaparin (LOVENOX) injection  40 mg Subcutaneous Q24H  . levothyroxine  137 mcg Oral QAC breakfast  . LORazepam  1 mg Oral BID  . morphine  30 mg Oral Q12H  . potassium chloride  40 mEq Oral Once  . sertraline  200 mg Oral QPM   Continuous Infusions:  PRN Meds:.acetaminophen **OR** acetaminophen, albuterol, HYDROcodone-acetaminophen, HYDROmorphone (DILAUDID) injection, HYDROmorphone, loperamide, ondansetron **OR** ondansetron (ZOFRAN) IV, senna-docusate  Antibiotics    Anti-infectives    Start     Dose/Rate Route Frequency Ordered Stop   05/25/14 1931  piperacillin-tazobactam (ZOSYN) IVPB 3.375 g  Status:  Discontinued     3.375 g 100 mL/hr over 30  Minutes Intravenous 3 times per day 05/25/14 1931 05/25/14 1935   05/23/14 0830  piperacillin-tazobactam (ZOSYN) IVPB 3.375 g  Status:  Discontinued     3.375 g 12.5 mL/hr over 240 Minutes Intravenous Every 8 hours 05/23/14 0804 05/27/14 1211   05/22/14 2215  cefTRIAXone (ROCEPHIN) 1 g in dextrose 5 % 50 mL IVPB     1 g 100 mL/hr over 30 Minutes Intravenous  Once 05/22/14 2209 05/22/14 2342        Subjective:   Caren Griffins Cross seen and examined  today.  Patient states she continues to be in pain. She states that last night, she thought was a nurse was being mean to her. Patient continuously assess for increase in her Dilaudid. She states she did not know that she had oral Dilaudid ordered. Patient has no specific complaints at this time.  Objective:   Filed Vitals:   05/29/14 0629 05/29/14 1347 05/29/14 2312 05/30/14 0649  BP: 122/73 100/63 124/70 127/72  Pulse: 92 86 87 83  Temp: 99.1 F (37.3 C) 99.5 F (37.5 C) 98.8 F (37.1 C) 98.9 F (37.2 C)  TempSrc: Oral Oral Oral Oral  Resp: 16 16 16 20   Height:      Weight:      SpO2: 96% 96% 97% 95%    Wt Readings from Last 3 Encounters:  05/24/14 75.297 kg (166 lb)  01/25/14 75.569 kg (166 lb 9.6 oz)  01/06/14 79.243 kg (174 lb 11.2 oz)     Intake/Output Summary (Last 24 hours) at 05/30/14 1751 Last data filed at 05/30/14 1005  Gross per 24 hour  Intake    480 ml  Output      0 ml  Net    480 ml    Exam  General: Appears sedated, in no acute distress.   Cardiovascular: S1 S2 auscultated, RRR  Respiratory: Clear auscultation bilaterally  Abdomen: Soft, obese, diffuse tenderness, positive bowel sounds  Extremities: No clubbing cyanosis or edema  Neuro: Awake and oriented.  Gait slow but normal.  Nonfocal  Psych: Depressed/flat   Data Review   Micro Results Recent Results (from the past 240 hour(s))  MRSA PCR Screening     Status: None   Collection Time: 05/23/14  2:55 AM  Result Value Ref Range Status   MRSA by PCR NEGATIVE NEGATIVE Final    Comment:        The GeneXpert MRSA Assay (FDA approved for NASAL specimens only), is one component of a comprehensive MRSA colonization surveillance program. It is not intended to diagnose MRSA infection nor to guide or monitor treatment for MRSA infections.   Culture, blood (routine x 2)     Status: None   Collection Time: 05/23/14 11:05 PM  Result Value Ref Range Status   Specimen Description BLOOD  RIGHT ARM  Final   Special Requests BOTTLES DRAWN AEROBIC ONLY 10CC  Final   Culture   Final    NO GROWTH 5 DAYS Performed at [facility]    Report Status 05/30/2014 FINAL  Final  Culture, blood (routine x 2)     Status: None   Collection Time: 05/23/14 11:10 PM  Result Value Ref Range Status   Specimen Description BLOOD RIGHT HAND  Final   Special Requests BOTTLES DRAWN AEROBIC ONLY Industry  Final   Culture   Final    NO GROWTH 5 DAYS Performed at [facility]    Report Status 05/30/2014 FINAL  Final  Culture, Urine  Status: None   Collection Time: 05/25/14 12:30 PM  Result Value Ref Range Status   Specimen Description URINE, CLEAN CATCH  Final   Special Requests NONE  Final   Colony Count NO GROWTH Performed at [facility]   Final   Culture NO GROWTH Performed at [facility]   Final   Report Status 05/27/2014 FINAL  Final  Clostridium Difficile by PCR     Status: None   Collection Time: 05/28/14 10:11 PM  Result Value Ref Range Status   C difficile by pcr NEGATIVE NEGATIVE Final    Radiology Reports Ct Abdomen Pelvis W Contrast  05/22/2014   CLINICAL DATA:  Endometrial cancer, nausea, pelvic drainage  EXAM: CT ABDOMEN AND PELVIS WITH CONTRAST  TECHNIQUE: Multidetector CT imaging of the abdomen and pelvis was performed using the standard protocol following bolus administration of intravenous contrast.  CONTRAST:  198mL OMNIPAQUE IOHEXOL 300 MG/ML SOLN, 14mL OMNIPAQUE IOHEXOL 300 MG/ML SOLN  COMPARISON:  03/06/2014  FINDINGS: Lower chest:  Lung bases are clear.  Hepatobiliary: Liver is within normal limits.  Gallbladder is unremarkable. No intrahepatic or extrahepatic ductal dilatation.  Pancreas: Within normal limits.  Spleen: Splenomegaly, measuring 15.5 cm in maximal dimension.  Adrenals/Urinary Tract: Adrenal glands are within normal limits.  Left kidney is within normal limits.  Malrotated right kidney. Moderate hydroureteronephrosis, likely  secondary to extrinsic compression.  Thick-walled bladder.  Stomach/Bowel: Stomach is within normal limits.  No evidence of bowel obstruction.  Appendix is not discretely visualized and may be surgically absent.  Moderate colonic stool burden, suggesting constipation.  Sigmoid colon is thick-walled and likely involved via the pelvic mass (described below).  Mass like thickening involving the rectum (series 2/image 57), new.  Vascular/Lymphatic: No evidence of abdominal aortic aneurysm.  Small subcentimeter retroperitoneal nodes, indeterminate.  Scattered nodes along the sigmoid mesocolon measuring up to 10 mm (series 2/image 52), along with bilateral internal iliac nodes measuring 19-20 mm (series 2/image 57), compatible with nodal metastases.  Reproductive: Status post hysterectomy.  7.4 x 5.7 x 6.1 cm necrotic pelvic mass (series 2/image 62), likely arising from the vaginal cuff, previously 3.2 x 5.6 cm. Gas within the lesion suggest direct communication with the sigmoid colon/ rectum. Additionally, the mass abuts the posterior wall of the bladder, which is also likely involved.  Other: No abdominopelvic ascites.  Musculoskeletal: Degenerative changes of the thoracolumbar spine, most prominent at L5-S1.  IMPRESSION: 7.4 cm necrotic pelvic mass arising from the vaginal cuff, increased.  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 Suspected involvement of the bladder.  Associated pelvic nodal metastases.  Moderate right hydroureteronephrosis, likely reflecting extrinsic compression by tumor.   Electronically Signed   By: Julian Hy M.D.   On: 05/22/2014 21:59    CBC  Recent Labs Lab 05/24/14 0530 05/25/14 0506 05/27/14 0500 05/28/14 0544  WBC 13.3* 10.6* 9.9 9.0  HGB 9.8* 9.5* 9.5* 9.3*  HCT 29.6* 29.2* 29.3* 28.7*  PLT 215 216 227 241  MCV 86.5 86.9 87.5 87.8  MCH 28.7 28.3 28.4 28.4  MCHC 33.1 32.5 32.4 32.4  RDW 13.0 13.0 13.3 13.5    Chemistries    Recent Labs Lab 05/24/14 0530 05/25/14 0506 05/27/14 0500 05/28/14 0544  NA 137 139 138 139  K 3.6 3.4* 3.0* 3.2*  CL 103 106 104 106  CO2 23 25 25 25   GLUCOSE 93 96 99 92  BUN 10 10 8 8   CREATININE 1.13* 0.98 0.94 0.81  CALCIUM 8.7 8.3*  8.6 8.4   ------------------------------------------------------------------------------------------------------------------ estimated creatinine clearance is 64.6 mL/min (by C-G formula based on Cr of 0.81). ------------------------------------------------------------------------------------------------------------------ No results for input(s): HGBA1C in the last 72 hours. ------------------------------------------------------------------------------------------------------------------ No results for input(s): CHOL, HDL, LDLCALC, TRIG, CHOLHDL, LDLDIRECT in the last 72 hours. ------------------------------------------------------------------------------------------------------------------ No results for input(s): TSH, T4TOTAL, T3FREE, THYROIDAB in the last 72 hours.  Invalid input(s): FREET3 ------------------------------------------------------------------------------------------------------------------ No results for input(s): VITAMINB12, FOLATE, FERRITIN, TIBC, IRON, RETICCTPCT in the last 72 hours.  Coagulation profile No results for input(s): INR, PROTIME in the last 168 hours.  No results for input(s): DDIMER in the last 72 hours.  Cardiac Enzymes No results for input(s): CKMB, TROPONINI, MYOGLOBIN in the last 168 hours.  Invalid input(s): CK ------------------------------------------------------------------------------------------------------------------ Invalid input(s): POCBNP    Elisheva Fallas D.O. on 05/30/2014 at 5:51 PM  Between 7am to 7pm - Pager - [phone]  After 7pm go to [email or web address] - password [email or web address]  And look for the night coverage person covering for me after hours  [facility] Office   [phone]

## 2014-05-30 NOTE — Progress Notes (Signed)
Patient was standing at the outer door of the patient room yelling into the hallway that her mother needed help getting cleaned up and a change of clothes.  Cindy H and I went to the room to speak with the patient.  She was back in her bed and stated that her mother was in the restroom cleaning up.  We explained that we could not care for her mother and that she and her mother needed to contact the patients sister in order to help with mothers care.  I also emphasized that it was not appropriate for her to be standing at the door yelling into the hallway that she needed to use her call light.  She stated "what call light".  We reeducated regarding the call light and she said "I know what that is, I use it several times a day".  She continued to raise her voice to [place] and myself.  When we were exiting the room she stated that she was hurting and we were not treating her pain.  Reminded patient that she needed to call when she needed medication.  Jenny Reichmann stated she would obtain the pain medication and return.

## 2014-05-30 NOTE — Progress Notes (Signed)
CSW attempted to meet with Pt along with psych MD but Pt was observed to be sleeping in room. Pt mother not present at this time. CSW will continue to follow Pt throughout hospital stay to assist with discharge planning needs.  Peri Maris, [place] 05/30/2014 3:27 PM

## 2014-05-30 NOTE — Progress Notes (Signed)
Patient refused her lab draw this AM.

## 2014-05-30 NOTE — Progress Notes (Signed)
Progress Note from the Palliative Medicine Team at [facility]:   -patient is much more calm and cooperative this morning  -we discussed a trial of oral medications with the hope of decreasing need for IV medications, she is in agreement  -continued discussion about plan of care on discharge, patient remains open to hospice services    Objective: Allergies  Allergen Reactions  . Sulfa Antibiotics Other (See Comments)    Unknown-pt states she is unsure of what her reaction is  . Sulfamethoxazole-Trimethoprim Other (See Comments)    Unknown reaction   Scheduled Meds: . ARIPiprazole  5 mg Oral BID  . docusate sodium  100 mg Oral BID  . enoxaparin (LOVENOX) injection  40 mg Subcutaneous Q24H  . levothyroxine  137 mcg Oral QAC breakfast  . LORazepam  1 mg Oral BID  . potassium chloride  40 mEq Oral Once  . sertraline  200 mg Oral QPM   Continuous Infusions:  PRN Meds:.acetaminophen **OR** acetaminophen, albuterol, HYDROcodone-acetaminophen, HYDROmorphone (DILAUDID) injection, HYDROmorphone, loperamide, ondansetron **OR** ondansetron (ZOFRAN) IV, senna-docusate  BP 127/72 mmHg  Pulse 83  Temp(Src) 98.9 F (37.2 C) (Oral)  Resp 20  Ht 4\' 10"  (1.473 m)  Wt 75.297 kg (166 lb)  BMI 34.70 kg/m2  SpO2 95%   PPS:50 %  Pain Score:  Rates it "off the chart"  Pain Location abdomen, radiating to back and thighs    Intake/Output Summary (Last 24 hours) at 05/30/14 0952 Last data filed at 05/29/14 1900  Gross per 24 hour  Intake    240 ml  Output      0 ml  Net    240 ml       Physical Exam:  General: chronically ill appearing, NAD Abdomen: distended and firm, tender to gentle palpations   Labs: CBC    Component Value Date/Time   WBC 9.0 05/28/2014 0544   WBC 5.0 02/18/2014 1521   RBC 3.27* 05/28/2014 0544   RBC 4.45 02/18/2014 1521   HGB 9.3* 05/28/2014 0544   HGB 12.9 02/18/2014 1521   HCT 28.7* 05/28/2014 0544   HCT 38.8 02/18/2014 1521   PLT 241  05/28/2014 0544   PLT 163 02/18/2014 1521   MCV 87.8 05/28/2014 0544   MCV 87.3 02/18/2014 1521   MCH 28.4 05/28/2014 0544   MCH 28.9 02/18/2014 1521   MCHC 32.4 05/28/2014 0544   MCHC 33.2 02/18/2014 1521   RDW 13.5 05/28/2014 0544   RDW 14.3 02/18/2014 1521   LYMPHSABS 1.4 05/22/2014 1823   LYMPHSABS 1.5 02/18/2014 1521   MONOABS 1.2* 05/22/2014 1823   MONOABS 0.5 02/18/2014 1521   EOSABS 0.5 05/22/2014 1823   EOSABS 0.1 02/18/2014 1521   BASOSABS 0.0 05/22/2014 1823   BASOSABS 0.0 02/18/2014 1521    BMET    Component Value Date/Time   NA 139 05/28/2014 0544   NA 139 02/18/2014 1521   K 3.2* 05/28/2014 0544   K 4.2 02/18/2014 1521   CL 106 05/28/2014 0544   CO2 25 05/28/2014 0544   CO2 27 02/18/2014 1521   GLUCOSE 92 05/28/2014 0544   GLUCOSE 93 02/18/2014 1521   BUN 8 05/28/2014 0544   BUN 7.8 02/18/2014 1521   CREATININE 0.81 05/28/2014 0544   CREATININE 0.7 02/18/2014 1521   CALCIUM 8.4 05/28/2014 0544   CALCIUM 9.9 02/18/2014 1521   GFRNONAA 78* 05/28/2014 0544   GFRAA >90 05/28/2014 0544    CMP     Component Value Date/Time  NA 139 05/28/2014 0544   NA 139 02/18/2014 1521   K 3.2* 05/28/2014 0544   K 4.2 02/18/2014 1521   CL 106 05/28/2014 0544   CO2 25 05/28/2014 0544   CO2 27 02/18/2014 1521   GLUCOSE 92 05/28/2014 0544   GLUCOSE 93 02/18/2014 1521   BUN 8 05/28/2014 0544   BUN 7.8 02/18/2014 1521   CREATININE 0.81 05/28/2014 0544   CREATININE 0.7 02/18/2014 1521   CALCIUM 8.4 05/28/2014 0544   CALCIUM 9.9 02/18/2014 1521   PROT 6.7 05/23/2014 0605   ALBUMIN 2.9* 05/23/2014 0605   AST 20 05/23/2014 0605   ALT 13 05/23/2014 0605   ALKPHOS 57 05/23/2014 0605   BILITOT 0.6 05/23/2014 0605   GFRNONAA 78* 05/28/2014 0544   GFRAA >90 05/28/2014 0544     Assessment and Plan: 1. Code Status: Full code  2. Symptom Control:        Pain: Continue Dilaudid 1 mg IV every 2 hrs prn                 Add Morphine ER 30 mg po  BID   3. Psycho/Social:  Emotional support to patietn and her mother.  Mother is confused at times and has limited insight into her daughter's situation   4. Spiritual  Chalplain involved   5. Disposition:  Discussed with patient possibility of hospice services on discharge.  I believe both pateint and family would benefit greatly from hospice services.   Time In Time Out Total Time Spent with Patient Total Overall Time  0745 0820 35 min 35 min    Greater than 50%  of this time was spent counseling and coordinating care related to the above assessment and plan.  Wadie Lessen NP  Palliative Medicine Team Team Phone # [phone] Pager [phone]  Discussed with Dr Coralyn Pear 1

## 2014-05-30 NOTE — Progress Notes (Signed)
Patient ID: [name], female   DOB: 1955/12/22, 59 y.o.   MRN: [id number]  Patient was seen sleeping in her room without any distress. Patient mother was not in her room today. Case manager will follow-up as needed for the disposition plans. Psychiatric consultation follow-up with her tomorrow.  Nichlos Kunzler,JANARDHAHA R. 05/30/2014 2:38 PM

## 2014-05-31 ENCOUNTER — Ambulatory Visit
Admit: 2014-05-31 | Discharge: 2014-05-31 | Disposition: A | Discharge status: Home / Self Care | Payer: Medicaid Other | Attending: Radiation Oncology | Admitting: Radiation Oncology

## 2014-05-31 LAB — BASIC METABOLIC PANEL
Anion gap: 6 (ref 5–15)
BUN: 8 mg/dL (ref 6–23)
CO2: 27 mmol/L (ref 19–32)
Calcium: 8.2 mg/dL — ABNORMAL LOW (ref 8.4–10.5)
Chloride: 104 mmol/L (ref 96–112)
Creatinine, Ser: 0.75 mg/dL (ref 0.50–1.10)
GFR calc non Af Amer: >90 mL/min (ref >90)
Glucose, Bld: 96 mg/dL (ref 70–99)
POTASSIUM: 2.9 mmol/L — AB (ref 3.5–5.1)
Sodium: 137 mmol/L (ref 135–145)

## 2014-05-31 MED ORDER — POTASSIUM CHLORIDE 10 MEQ/100ML IV SOLN
10.0000 meq | INTRAVENOUS | Status: AC
  Administered 2014-05-31 (×4): 10 meq via INTRAVENOUS
  Filled 2014-05-31 (×4): qty 100

## 2014-05-31 MED ORDER — DEXAMETHASONE 4 MG PO TABS
4.0000 mg | ORAL_TABLET | Freq: Every day | ORAL | Status: DC
Start: 2014-05-31 — End: 2014-06-06
  Administered 2014-06-01 – 2014-06-05 (×4): 4 mg via ORAL
  Filled 2014-05-31 (×7): qty 1

## 2014-05-31 NOTE — Progress Notes (Signed)
CSW continuing to follow.   CSW received update from MD and PMT NP and discussed hopeful plan for transition home with hospice when medically ready and PICC line placement on Monday.   Per MD, MD request CSW to have APS involvement initiated upon discharge.   CSW to continue to follow and will notify APS of pt case once pt medically ready for discharge.  Alison Murray, MSW, [place] [employer] [phone]

## 2014-05-31 NOTE — Progress Notes (Signed)
[facility] Therapy Treatment Record Phone [phone]   Radiation Therapy was administered to [name] on: 05/31/2014  10:14 AM and was treatment # 6 out of a planned course of 10 treatments.

## 2014-05-31 NOTE — Progress Notes (Signed)
Clinical Social Work  CSW went to room but patient in the restroom. Mom at bedside and reports patient is doing better but still in lots of pain. CSW spoke with bedside RN who reports that patient was speaking with PMT this morning to decide on GOC. Bedside RN reports patient has been refusing all medication besides pain medications but denies any behaviors. CSW will continue to follow.  [place], [place] [phone]

## 2014-05-31 NOTE — Progress Notes (Signed)
[facility]                                                                              Patient Demographics  [name], is a 59 y.o. female, DOB - October 17, 1955, ION:[id number]  Admit date - 05/22/2014   Admitting Physician [name], MD  Outpatient Primary MD for the patient is [name]., FNP  LOS - 8   Chief Complaint  Patient presents with  . Abdominal Pain  . Nausea  . Emesis  . Constipation      HPI on 05/22/2014 by Dr. [name] Patient with complex medical hx. extensive history of noncompliance and drug seeking behavior and polysubstance abuse. 2013 patient was found to have early stage endometrial cancer. 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 Patient did not follow up with gyn oncology.  In 2015 she presented with vaginal bleeding, pelvic pain and discharge. 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  CT at that time showed new lobulated soft tissue mass involving vaginal cuff and posterior wall of the urinary bladder, with areas of internal necrosis, measuring 3.7 x 6.9 cm; there was also new pelvic adenopathy at iliac bifurcations bilaterally, but no obvious disease outside of pelvis, including nothing involving bowels, and no hydronephrosis. Patient had an admission for diverticulitis in October 2015 and ended time was able to be seen in-house by radiation oncology and oncology which point palliative radiation therapy has been started. Patient has been going to her treatments intermittently. She has completed only about 1.5 [name] has 6 treatments. Radiation oncologist started her on Dilaudid 4 mg as needed for pain. She is supposed to follow-up with pain clinic but canceled her appointment. Patient continued to  have worsening abdominal pain but started to have stabbing pain in her vaginal and rectal area. She have had frequent urination in seen pus from her rectal area. She has been intermittently tearful. Reports low-grade fevers. Today she was seen again by radiation oncology. Who has discussed potential hospice or palliative care evaluation. Patient was recommended to present to emergency department so that the father workup could be done. Patient currently resides with her 57 year old mother. Scan today showed progression of her tumor burden 7 .4 cm necrotic pelvic mass arising from the vaginal cuff, Increased. 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 Suspected involvement of the bladder. Moderate right hydroureteronephrosis, likely reflecting extrinsic compression by tumor. Unfortunately patient and the family have very poor insight. Patient has been verbally abusive to the staff and her family. At this point she is concentrated on pain management. Neither patient nor her family at this point is ready to have a conversation about her prognosis stating that "she'll be just fine". Unable to finish interview due to patient becoming agitated and screaming out.  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 UA showed evidence of UTI/ pateint with elevated WBC. Transiently tachycardic. NO nausea or vomiting. She refuses trial of PO pain medications and threatens to leave AMA.  Interim history -Patient continues to need IV pain medication. She will complete another 7 radiation treatments.  Assessment & Plan   Sepsis secondary to UTI -Urine culture from 2/25 not found; urine culture 05/25/2014 shows no growth -blood cultures show no growth to date -Initially placed on zosyn- discontinued 05/27/2014 -remains stable  Metastatic Endometrial cancer with hydronephrosis/ Cancer associated pain -CT abdomen and  pelvis shows 7.4 necrotic pelvic mass arising in the vaginal cuff, increased. 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 [name] (GynOnc) who spoke with Dr. [name] (RadOnc) and recommended patient have 10 days of palliative radiation during hospitalization as she supposedly could not make it to her appointments. -Patient has a very unfortunate social situation.  Social work consulted and assisting. -Palliative care consult and appreciated- changed her medications to Dialudid IV 1mg  q2h and PO 2mg  q3h PRN -Continuously asks for more IV pain medication.  There is no doubt that patient has pain, however at the time of examination, she appears comfortable and falls asleep easily.  Repeatedly explained to patient that there are too many side effects with increased pain medications, including decreased respiratory drive.  -Case was discussed with palliative care today regarding transition to home hospice. Pain symptoms have been difficult to control and may need IV narcotic analgesics at home for which PICC line has been ordered.  -Patient refusing to sign consent for PICC line on 05/29/2014, I discussed this further with Palliative Care. Will discuss goals of care further today, including topic of transitioning to hospice care.  -Patient agreeable to PICC line placement on Monday.  Essential hypertension -Currently stable, on no home medications -Will continue to monitor  Hypothyroidism -TSH 22.742 (wonder if patient was taking her  synthroid) -Continue Synthroid- increased dose to 166mcg daily  Anxiety/Depression/Psychosis -Continue Ativan, sertraline, thiothixene -Consulted psychiatry for medication assistance- consultation recommendations pending  Asthma -Currently stable, continue albuterol PRN  Hypokalemia -A.m. showing a potassium of 2.9, will replace with 4 runs of IV potassium chloride. -Repeat lab work in a.m.   Goals of care  30 minute discussion spent with patient and her mother who was present at bedside discussing goals of care. 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 I explained that this intervention was not curative and was being done for control of symptoms. We discussed transition to hospice, having a PICC line placed for administration of IV narcotics at home. We also discussed CODE STATUS as she remains a full code. I asked if she would want undergo cardiopulmonary resuscitation despite the likelihood of this intervention not changing outcomes and likely leading to significant pain at the end of life. Patient becoming upset with me not wanting to talk about the subjective. She explained to me that there was a cure for everything and hoped that the current radiation therapy would get her well again.    Code Status: Full Code  Family Communication: Mother at bedside  Disposition Plan: Admitted, Continue palliative radiation- 7 more treatments which occur M-F  Time Spent in minutes  40 minutes, 30 min spent discussing goals of care   Procedures  Palliative Radiation   Consults   Oncology Radiation oncology [facility], via phone Palliative care  DVT Prophylaxis  lovenox  Lab Results  Component Value Date   PLT 241  05/28/2014    Medications  Scheduled Meds: . ARIPiprazole  5 mg Oral BID  . dexamethasone  4 mg Oral Daily  . docusate sodium  100 mg Oral BID  . enoxaparin (LOVENOX) injection  40 mg Subcutaneous Q24H  . levothyroxine  137 mcg Oral QAC breakfast  . LORazepam  1 mg Oral BID  . morphine  30 mg Oral Q12H  . potassium chloride  10 mEq Intravenous Q1 Hr x 4  . potassium chloride  40 mEq Oral Once  . sertraline  200 mg Oral QPM   Continuous Infusions:  PRN Meds:.acetaminophen **OR** acetaminophen, albuterol, HYDROcodone-acetaminophen, HYDROmorphone (DILAUDID) injection, HYDROmorphone, loperamide, ondansetron **OR** ondansetron (ZOFRAN) IV, senna-docusate  Antibiotics    Anti-infectives    Start     Dose/Rate Route Frequency Ordered Stop   05/25/14 1931  piperacillin-tazobactam (ZOSYN) IVPB 3.375 g  Status:  Discontinued     3.375 g 100 mL/hr over 30 Minutes Intravenous 3 times per day 05/25/14 1931 05/25/14 1935   05/23/14 0830  piperacillin-tazobactam (ZOSYN) IVPB 3.375 g  Status:  Discontinued     3.375 g 12.5 mL/hr over 240 Minutes Intravenous Every 8 hours 05/23/14 0804 05/27/14 1211   05/22/14 2215  cefTRIAXone (ROCEPHIN) 1 g in dextrose 5 % 50 mL IVPB     1 g 100 mL/hr over 30 Minutes Intravenous  Once 05/22/14 2209 05/22/14 2342        Subjective:   Caren Griffins Kosch seen and examined today.  Patient states she continues to be in pain. She states that last night, she thought was a nurse was being mean to her. Patient continuously assess for increase in her Dilaudid. She states she did not know that she had oral Dilaudid ordered. Patient has no specific complaints at this time.  Objective:   Filed Vitals:   05/29/14 2312 05/30/14 0649 05/30/14 2114 05/31/14 0513  BP: 124/70 127/72 99/63 114/70  Pulse: 87 83 81 92  Temp:  98.9 F (37.2 C) 98.8 F (37.1 C) 99.1 F (37.3 C)  TempSrc: Oral Oral Oral Oral  Resp: 16 20 16 16   Height:      Weight:      SpO2: 97% 95%  97% 97%    Wt Readings from Last 3 Encounters:  05/24/14 75.297 kg (166 lb)  01/25/14 75.569 kg (166 lb 9.6 oz)  01/06/14 79.243 kg (174 lb 11.2 oz)     Intake/Output Summary (Last 24 hours) at 05/31/14 1207 Last data filed at 05/31/14 0514  Gross per 24 hour  Intake    775 ml  Output      0 ml  Net    775 ml    Exam  General: Appears sedated, in no acute distress.   Cardiovascular: S1 S2 auscultated, RRR  Respiratory: Clear auscultation bilaterally  Abdomen: Soft, obese, diffuse tenderness, positive bowel sounds  Extremities: No clubbing cyanosis or edema  Neuro: Awake and oriented.  Gait slow but normal.  Nonfocal  Psych: Depressed/flat   Data Review   Micro Results Recent Results (from the past 240 hour(s))  MRSA PCR Screening  Status: None   Collection Time: 05/23/14  2:55 AM  Result Value Ref Range Status   MRSA by PCR NEGATIVE NEGATIVE Final    Comment:        The GeneXpert MRSA Assay (FDA approved for NASAL specimens only), is one component of a comprehensive MRSA colonization surveillance program. It is not intended to diagnose MRSA infection nor to guide or monitor treatment for MRSA infections.   Culture, blood (routine x 2)     Status: None   Collection Time: 05/23/14 11:05 PM  Result Value Ref Range Status   Specimen Description BLOOD RIGHT ARM  Final   Special Requests BOTTLES DRAWN AEROBIC ONLY 10CC  Final   Culture   Final    NO GROWTH 5 DAYS Performed at [facility]    Report Status 05/30/2014 FINAL  Final  Culture, blood (routine x 2)     Status: None   Collection Time: 05/23/14 11:10 PM  Result Value Ref Range Status   Specimen Description BLOOD RIGHT HAND  Final   Special Requests BOTTLES DRAWN AEROBIC ONLY [place]  Final   Culture   Final    NO GROWTH 5 DAYS Performed at [facility]    Report Status 05/30/2014 FINAL  Final  Culture, Urine     Status: None   Collection Time: 05/25/14 12:30 PM  Result Value  Ref Range Status   Specimen Description URINE, CLEAN CATCH  Final   Special Requests NONE  Final   Colony Count NO GROWTH Performed at [facility]   Final   Culture NO GROWTH Performed at [facility]   Final   Report Status 05/27/2014 FINAL  Final  Clostridium Difficile by PCR     Status: None   Collection Time: 05/28/14 10:11 PM  Result Value Ref Range Status   C difficile by pcr NEGATIVE NEGATIVE Final    Radiology Reports Ct Abdomen Pelvis W Contrast  05/22/2014   CLINICAL DATA:  Endometrial cancer, nausea, pelvic drainage  EXAM: CT ABDOMEN AND PELVIS WITH CONTRAST  TECHNIQUE: Multidetector CT imaging of the abdomen and pelvis was performed using the standard protocol following bolus administration of intravenous contrast.  CONTRAST:  151mL OMNIPAQUE IOHEXOL 300 MG/ML SOLN, 42mL OMNIPAQUE IOHEXOL 300 MG/ML SOLN  COMPARISON:  03/06/2014  FINDINGS: Lower chest:  Lung bases are clear.  Hepatobiliary: Liver is within normal limits.  Gallbladder is unremarkable. No intrahepatic or extrahepatic ductal dilatation.  Pancreas: Within normal limits.  Spleen: Splenomegaly, measuring 15.5 cm in maximal dimension.  Adrenals/Urinary Tract: Adrenal glands are within normal limits.  Left kidney is within normal limits.  Malrotated right kidney. Moderate hydroureteronephrosis, likely secondary to extrinsic compression.  Thick-walled bladder.  Stomach/Bowel: Stomach is within normal limits.  No evidence of bowel obstruction.  Appendix is not discretely visualized and may be surgically absent.  Moderate colonic stool burden, suggesting constipation.  Sigmoid colon is thick-walled and likely involved via the pelvic mass (described below).  Mass like thickening involving the rectum (series 2/image 57), new.  Vascular/Lymphatic: No evidence of abdominal aortic aneurysm.  Small subcentimeter retroperitoneal nodes, indeterminate.  Scattered nodes along the sigmoid mesocolon measuring up to 10 mm  (series 2/image 52), along with bilateral internal iliac nodes measuring 19-20 mm (series 2/image 57), compatible with nodal metastases.  Reproductive: Status post hysterectomy.  7.4 x 5.7 x 6.1 cm necrotic pelvic mass (series 2/image 62), likely arising from the vaginal cuff, previously 3.2 x 5.6 cm. Gas within the lesion  suggest direct communication with the sigmoid colon/ rectum. Additionally, the mass abuts the posterior wall of the bladder, which is also likely involved.  Other: No abdominopelvic ascites.  Musculoskeletal: Degenerative changes of the thoracolumbar spine, most prominent at L5-S1.  IMPRESSION: 7.4 cm necrotic pelvic mass arising from the vaginal cuff, increased.  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 Suspected involvement of the bladder.  Associated pelvic nodal metastases.  Moderate right hydroureteronephrosis, likely reflecting extrinsic compression by tumor.   Electronically Signed   By: Julian Hy M.D.   On: 05/22/2014 21:59    CBC  Recent Labs Lab 05/25/14 0506 05/27/14 0500 05/28/14 0544  WBC 10.6* 9.9 9.0  HGB 9.5* 9.5* 9.3*  HCT 29.2* 29.3* 28.7*  PLT 216 227 241  MCV 86.9 87.5 87.8  MCH 28.3 28.4 28.4  MCHC 32.5 32.4 32.4  RDW 13.0 13.3 13.5    Chemistries   Recent Labs Lab 05/25/14 0506 05/27/14 0500 05/28/14 0544 05/31/14 0410  NA 139 138 139 137  K 3.4* 3.0* 3.2* 2.9*  CL 106 104 106 104  CO2 25 25 25 27   GLUCOSE 96 99 92 96  BUN 10 8 8 8   CREATININE 0.98 0.94 0.81 0.75  CALCIUM 8.3* 8.6 8.4 8.2*   ------------------------------------------------------------------------------------------------------------------ estimated creatinine clearance is 65.4 mL/min (by C-G formula based on Cr of 0.75). ------------------------------------------------------------------------------------------------------------------ No results for input(s): HGBA1C in the last 72  hours. ------------------------------------------------------------------------------------------------------------------ No results for input(s): CHOL, HDL, LDLCALC, TRIG, CHOLHDL, LDLDIRECT in the last 72 hours. ------------------------------------------------------------------------------------------------------------------ No results for input(s): TSH, T4TOTAL, T3FREE, THYROIDAB in the last 72 hours.  Invalid input(s): FREET3 ------------------------------------------------------------------------------------------------------------------ No results for input(s): VITAMINB12, FOLATE, FERRITIN, TIBC, IRON, RETICCTPCT in the last 72 hours.  Coagulation profile No results for input(s): INR, PROTIME in the last 168 hours.  No results for input(s): DDIMER in the last 72 hours.  Cardiac Enzymes No results for input(s): CKMB, TROPONINI, MYOGLOBIN in the last 168 hours.  Invalid input(s): CK ------------------------------------------------------------------------------------------------------------------ Invalid input(s): POCBNP    Addalyn Speedy D.O. on 05/31/2014 at 12:07 PM  Between 7am to 7pm - Pager - [phone]  After 7pm go to [email or web address] - password [email or web address]  And look for the night coverage person covering for me after hours  [facility] Office  [phone]

## 2014-06-01 NOTE — Progress Notes (Signed)
[facility]                                                                              Patient Demographics  [name], is a 59 y.o. female, DOB - Sep 30, 1955, NTI:[id number]  Admit date - 05/22/2014   Admitting Physician Tracy Baker, MD  Outpatient Primary MD for the patient is [name]., FNP  LOS - 9   Chief Complaint  Patient presents with  . Abdominal Pain  . Nausea  . Emesis  . Constipation      HPI on 05/22/2014 by Dr. Toy Mcdaniel Patient with complex medical hx. extensive history of noncompliance and drug seeking behavior and polysubstance abuse. 2013 patient was found to have early stage endometrial cancer. She underwent robotic assisted hysterectomy BSO by Dr [name] on 10-2011, with pathology demonstrating superficially invasive endometroid carcinoma arising in endometrial polyp. Patient did not follow up with gyn oncology.  In 2015 she presented with vaginal bleeding, pelvic pain and discharge. 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  CT at that time showed new lobulated soft tissue mass involving vaginal cuff and posterior wall of the urinary bladder, with areas of internal necrosis, measuring 3.7 x 6.9 cm; there was also new pelvic adenopathy at iliac bifurcations bilaterally, but no obvious disease outside of pelvis, including nothing involving bowels, and no hydronephrosis. Patient had an admission for diverticulitis in October 2015 and ended time was able to be seen in-house by radiation oncology and oncology which point palliative radiation therapy has been started. Patient has been going to her treatments intermittently. She has completed only about 1.5 [name] has 6 treatments. Radiation oncologist started her on Dilaudid 4 mg as needed for pain. She is supposed to follow-up with pain clinic but canceled her appointment. Patient continued to  have worsening abdominal pain but started to have stabbing pain in her vaginal and rectal area. She have had frequent urination in seen pus from her rectal area. She has been intermittently tearful. Reports low-grade fevers. Today she was seen again by radiation oncology. Who has discussed potential hospice or palliative care evaluation. Patient was recommended to present to emergency department so that the father workup could be done. Patient currently resides with her 63 year old mother. Scan today showed progression of her tumor burden 7 .4 cm necrotic pelvic mass arising from the vaginal cuff, Increased. 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 Suspected involvement of the bladder. Moderate right hydroureteronephrosis, likely reflecting extrinsic compression by tumor. Unfortunately patient and the family have very poor insight. Patient has been verbally abusive to the staff and her family. At this point she is concentrated on pain management. Neither patient nor her family at this point is ready to have a conversation about her prognosis stating that "she'll be just fine". Unable to finish interview due to patient becoming agitated and screaming out.  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 UA showed evidence of UTI/ pateint with elevated WBC. Transiently tachycardic. NO nausea or vomiting. She refuses trial of PO pain medications and threatens to leave AMA.  Interim history -Patient continues to need IV pain medication. She will complete another 7 radiation treatments.  Assessment & Plan   Sepsis secondary to UTI -remains stable  Metastatic Endometrial cancer with hydronephrosis/ Cancer associated pain -CT abdomen and pelvis shows 7.4 necrotic pelvic mass arising in the vaginal cuff, increased. 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 Alycia Mcdaniel (GynOnc) who spoke with Dr. Sondra Mcdaniel (RadOnc) and recommended patient have 10 days of palliative radiation during hospitalization as she supposedly could not make it to her appointments. -Patient has a very unfortunate social situation.  Social work consulted and assisting. -Palliative care consult and appreciated- changed her medications to Dialudid IV 1mg  q2h and PO 2mg  q3h PRN -Continuously asks for more IV pain medication.  There is no doubt that patient has pain, however at the time of examination, she appears comfortable and falls asleep easily.  Repeatedly explained to patient that there are too many side effects with increased pain medications, including decreased respiratory drive.  -Case was discussed with palliative care today regarding transition to home hospice. Pain symptoms have been difficult to control and may need IV narcotic analgesics at home for which PICC line has been ordered.  -Patient refusing to sign consent for PICC line on 05/29/2014, I discussed this further with Palliative Care. Will discuss goals of care further today, including topic of transitioning to hospice care.  -Patient agreeable to PICC line placement on Monday.  Essential hypertension -Currently stable, on no home medications, having last BP of 127/69 -Will continue to monitor  Hypothyroidism -TSH 22.742 (wonder if patient was taking her synthroid) -Continue Synthroid- increased dose to 139mcg daily  Anxiety/Depression/Psychosis -Continue Ativan, sertraline,  thiothixene -Consulted psychiatry for medication assistance- consultation recommendations pending  Asthma -Currently stable, continue albuterol PRN  Hypokalemia -A.m. showing a potassium of 2.9, will replace with 4 runs of IV potassium chloride. -Repeat lab work in a.m.   Goals of care  On 05/31/2014 held a 30 minute discussion spent with patient and her mother who was present at bedside discussing goals of care. 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 I explained that this intervention was not curative and was being done for control of symptoms. We discussed transition to hospice, having a PICC line placed for administration of IV narcotics at home. We also discussed CODE STATUS as she remains a full code. I asked if she would want undergo cardiopulmonary resuscitation despite the likelihood of this intervention not changing outcomes and likely leading to significant pain at the end of life. Patient becoming upset with me not wanting to talk about the subjective. She explained to me that there was a cure for everything and hoped that the current radiation therapy would get her well again.    Code Status: Full Code  Family Communication: Mother at bedside  Disposition Plan: Admitted, Continue palliative radiation- 7 more treatments which occur M-F  Time Spent  in minutes: 15  Procedures  Palliative Radiation   Consults   Oncology Radiation oncology [facility], via phone Palliative care  DVT Prophylaxis  lovenox  Lab Results  Component Value Date   PLT 241 05/28/2014    Medications  Scheduled Meds: . ARIPiprazole  5 mg Oral BID  . dexamethasone  4 mg Oral Daily  . docusate sodium  100 mg Oral BID  .  enoxaparin (LOVENOX) injection  40 mg Subcutaneous Q24H  . levothyroxine  137 mcg Oral QAC breakfast  . LORazepam  1 mg Oral BID  . morphine  30 mg Oral Q12H  . potassium chloride  40 mEq Oral Once  . sertraline  200 mg Oral QPM   Continuous Infusions:  PRN Meds:.acetaminophen **OR** acetaminophen, albuterol, HYDROcodone-acetaminophen, HYDROmorphone (DILAUDID) injection, HYDROmorphone, loperamide, ondansetron **OR** ondansetron (ZOFRAN) IV, senna-docusate  Antibiotics    Anti-infectives    Start     Dose/Rate Route Frequency Ordered Stop   05/25/14 1931  piperacillin-tazobactam (ZOSYN) IVPB 3.375 g  Status:  Discontinued     3.375 g 100 mL/hr over 30 Minutes Intravenous 3 times per day 05/25/14 1931 05/25/14 1935   05/23/14 0830  piperacillin-tazobactam (ZOSYN) IVPB 3.375 g  Status:  Discontinued     3.375 g 12.5 mL/hr over 240 Minutes Intravenous Every 8 hours 05/23/14 0804 05/27/14 1211   05/22/14 2215  cefTRIAXone (ROCEPHIN) 1 g in dextrose 5 % 50 mL IVPB     1 g 100 mL/hr over 30 Minutes Intravenous  Once 05/22/14 2209 05/22/14 2342        Subjective:   [name] seen and examined today.  Patient states she continues to be in pain. She states that last night, she thought was a nurse was being mean to her. Patient continuously assess for increase in her Dilaudid. She states she did not know that she had oral Dilaudid ordered. Patient has no specific complaints at this time.  Objective:   Filed Vitals:   05/31/14 1500 05/31/14 2105 06/01/14 0530 06/01/14 1310  BP: 113/65 129/67 101/56 127/69  Pulse: 84 88 79 88  Temp: 99 F (37.2 C) 99.2 F (37.3 C) 98.4 F (36.9 C) 98.1 F (36.7 C)  TempSrc: Oral Oral Oral Oral  Resp: 16 16 16 18   Height:      Weight:      SpO2: 95% 97% 97% 96%    Wt Readings from Last 3 Encounters:  05/24/14 75.297 kg (166 lb)  01/25/14 75.569 kg (166 lb 9.6 oz)  01/06/14 79.243 kg (174 lb 11.2 oz)     Intake/Output Summary (Last 24  hours) at 06/01/14 1542 Last data filed at 06/01/14 1230  Gross per 24 hour  Intake   2930 ml  Output      0 ml  Net   2930 ml    Exam  General: Appears sedated, in no acute distress.   Cardiovascular: S1 S2 auscultated, RRR  Respiratory: Clear auscultation bilaterally  Abdomen: Soft, obese, diffuse tenderness, positive bowel sounds  Extremities: No clubbing cyanosis or edema  Neuro: Awake and oriented.  Gait slow but normal.  Nonfocal  Psych: Depressed/flat   Data Review   Micro Results Recent Results (from the past 240 hour(s))  MRSA PCR Screening     Status: None   Collection Time: 05/23/14  2:55 AM  Result Value Ref Range Status   MRSA by PCR NEGATIVE NEGATIVE Final    Comment:        The  GeneXpert MRSA Assay (FDA approved for NASAL specimens only), is one component of a comprehensive MRSA colonization surveillance program. It is not intended to diagnose MRSA infection nor to guide or monitor treatment for MRSA infections.   Culture, blood (routine x 2)     Status: None   Collection Time: 05/23/14 11:05 PM  Result Value Ref Range Status   Specimen Description BLOOD RIGHT ARM  Final   Special Requests BOTTLES DRAWN AEROBIC ONLY 10CC  Final   Culture   Final    NO GROWTH 5 DAYS Performed at [facility]    Report Status 05/30/2014 FINAL  Final  Culture, blood (routine x 2)     Status: None   Collection Time: 05/23/14 11:10 PM  Result Value Ref Range Status   Specimen Description BLOOD RIGHT HAND  Final   Special Requests BOTTLES DRAWN AEROBIC ONLY [place]  Final   Culture   Final    NO GROWTH 5 DAYS Performed at [facility]    Report Status 05/30/2014 FINAL  Final  Culture, Urine     Status: None   Collection Time: 05/25/14 12:30 PM  Result Value Ref Range Status   Specimen Description URINE, CLEAN CATCH  Final   Special Requests NONE  Final   Colony Count NO GROWTH Performed at [facility]   Final   Culture NO  GROWTH Performed at [facility]   Final   Report Status 05/27/2014 FINAL  Final  Clostridium Difficile by PCR     Status: None   Collection Time: 05/28/14 10:11 PM  Result Value Ref Range Status   C difficile by pcr NEGATIVE NEGATIVE Final    Radiology Reports Ct Abdomen Pelvis W Contrast  05/22/2014   CLINICAL DATA:  Endometrial cancer, nausea, pelvic drainage  EXAM: CT ABDOMEN AND PELVIS WITH CONTRAST  TECHNIQUE: Multidetector CT imaging of the abdomen and pelvis was performed using the standard protocol following bolus administration of intravenous contrast.  CONTRAST:  16mL OMNIPAQUE IOHEXOL 300 MG/ML SOLN, 97mL OMNIPAQUE IOHEXOL 300 MG/ML SOLN  COMPARISON:  03/06/2014  FINDINGS: Lower chest:  Lung bases are clear.  Hepatobiliary: Liver is within normal limits.  Gallbladder is unremarkable. No intrahepatic or extrahepatic ductal dilatation.  Pancreas: Within normal limits.  Spleen: Splenomegaly, measuring 15.5 cm in maximal dimension.  Adrenals/Urinary Tract: Adrenal glands are within normal limits.  Left kidney is within normal limits.  Malrotated right kidney. Moderate hydroureteronephrosis, likely secondary to extrinsic compression.  Thick-walled bladder.  Stomach/Bowel: Stomach is within normal limits.  No evidence of bowel obstruction.  Appendix is not discretely visualized and may be surgically absent.  Moderate colonic stool burden, suggesting constipation.  Sigmoid colon is thick-walled and likely involved via the pelvic mass (described below).  Mass like thickening involving the rectum (series 2/image 57), new.  Vascular/Lymphatic: No evidence of abdominal aortic aneurysm.  Small subcentimeter retroperitoneal nodes, indeterminate.  Scattered nodes along the sigmoid mesocolon measuring up to 10 mm (series 2/image 52), along with bilateral internal iliac nodes measuring 19-20 mm (series 2/image 57), compatible with nodal metastases.  Reproductive: Status Mcdaniel hysterectomy.  7.4 x 5.7  x 6.1 cm necrotic pelvic mass (series 2/image 62), likely arising from the vaginal cuff, previously 3.2 x 5.6 cm. Gas within the lesion suggest direct communication with the sigmoid colon/ rectum. Additionally, the mass abuts the posterior wall of the bladder, which is also likely involved.  Other: No abdominopelvic ascites.  Musculoskeletal: Degenerative changes of the thoracolumbar spine,  most prominent at L5-S1.  IMPRESSION: 7.4 cm necrotic pelvic mass arising from the vaginal cuff, increased.  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 Suspected involvement of the bladder.  Associated pelvic nodal metastases.  Moderate right hydroureteronephrosis, likely reflecting extrinsic compression by tumor.   Electronically Signed   By: Julian Hy M.D.   On: 05/22/2014 21:59    CBC  Recent Labs Lab 05/27/14 0500 05/28/14 0544  WBC 9.9 9.0  HGB 9.5* 9.3*  HCT 29.3* 28.7*  PLT 227 241  MCV 87.5 87.8  MCH 28.4 28.4  MCHC 32.4 32.4  RDW 13.3 13.5    Chemistries   Recent Labs Lab 05/27/14 0500 05/28/14 0544 05/31/14 0410  NA 138 139 137  K 3.0* 3.2* 2.9*  CL 104 106 104  CO2 25 25 27   GLUCOSE 99 92 96  BUN 8 8 8   CREATININE 0.94 0.81 0.75  CALCIUM 8.6 8.4 8.2*   ------------------------------------------------------------------------------------------------------------------ estimated creatinine clearance is 65.4 mL/min (by C-G formula based on Cr of 0.75). ------------------------------------------------------------------------------------------------------------------ No results for input(s): HGBA1C in the last 72 hours. ------------------------------------------------------------------------------------------------------------------ No results for input(s): CHOL, HDL, LDLCALC, TRIG, CHOLHDL, LDLDIRECT in the last 72 hours. ------------------------------------------------------------------------------------------------------------------ No  results for input(s): TSH, T4TOTAL, T3FREE, THYROIDAB in the last 72 hours.  Invalid input(s): FREET3 ------------------------------------------------------------------------------------------------------------------ No results for input(s): VITAMINB12, FOLATE, FERRITIN, TIBC, IRON, RETICCTPCT in the last 72 hours.  Coagulation profile No results for input(s): INR, PROTIME in the last 168 hours.  No results for input(s): DDIMER in the last 72 hours.  Cardiac Enzymes No results for input(s): CKMB, TROPONINI, MYOGLOBIN in the last 168 hours.  Invalid input(s): CK ------------------------------------------------------------------------------------------------------------------ Invalid input(s): POCBNP    Preciosa Bundrick D.O. on 06/01/2014 at 3:42 PM  Between 7am to 7pm - Pager - [phone]  After 7pm go to [email or web address] - password [email or web address]  And look for the night coverage person covering for me after hours  [facility] Office  [phone]

## 2014-06-02 LAB — CBC
HCT: 27.7 % — ABNORMAL LOW (ref 36.0–46.0)
Hemoglobin: 9.1 g/dL — ABNORMAL LOW (ref 12.0–15.0)
MCH: 28.4 pg (ref 26.0–34.0)
MCHC: 32.9 g/dL (ref 30.0–36.0)
MCV: 86.6 fL (ref 78.0–100.0)
Platelets: 232 10*3/uL (ref 150–400)
RBC: 3.2 MIL/uL — ABNORMAL LOW (ref 3.87–5.11)
RDW: 13.7 % (ref 11.5–15.5)
WBC: 7.1 10*3/uL (ref 4.0–10.5)

## 2014-06-02 LAB — BASIC METABOLIC PANEL
Anion gap: 9 (ref 5–15)
BUN: 10 mg/dL (ref 6–23)
CHLORIDE: 106 mmol/L (ref 96–112)
CO2: 26 mmol/L (ref 19–32)
CREATININE: 0.74 mg/dL (ref 0.50–1.10)
Calcium: 8.9 mg/dL (ref 8.4–10.5)
GLUCOSE: 90 mg/dL (ref 70–99)
POTASSIUM: 3.5 mmol/L (ref 3.5–5.1)
Sodium: 141 mmol/L (ref 135–145)

## 2014-06-02 NOTE — Progress Notes (Signed)
[facility]                                                                              Patient Demographics  [name], is a 59 y.o. female, DOB - Nov 30, 1955, AOZ:[id number]  Admit date - 05/22/2014   Admitting Physician [name], MD  Outpatient Primary MD for the patient is [name], FNP  LOS - 37   Chief Complaint  Patient presents with  . Abdominal Pain  . Nausea  . Emesis  . Constipation      HPI on 05/22/2014 by Dr. [name] Patient with complex medical hx. extensive history of noncompliance and drug seeking behavior and polysubstance abuse. 2013 patient was found to have early stage endometrial cancer. 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 Patient did not follow up with gyn oncology.  In 2015 she presented with vaginal bleeding, pelvic pain and discharge. 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  CT at that time showed new lobulated soft tissue mass involving vaginal cuff and posterior wall of the urinary bladder, with areas of internal necrosis, measuring 3.7 x 6.9 cm; there was also new pelvic adenopathy at iliac bifurcations bilaterally, but no obvious disease outside of pelvis, including nothing involving bowels, and no hydronephrosis. Patient had an admission for diverticulitis in October 2015 and ended time was able to be seen in-house by radiation oncology and oncology which point palliative radiation therapy has been started. Patient has been going to her treatments intermittently. She has completed only about 1.5 [name] has 6 treatments. Radiation oncologist started her on Dilaudid 4 mg as needed for pain. She is supposed to follow-up with pain clinic but canceled her appointment. Patient continued to  have worsening abdominal pain but started to have stabbing pain in her vaginal and rectal area. She have had frequent urination in seen pus from her rectal area. She has been intermittently tearful. Reports low-grade fevers. Today she was seen again by radiation oncology. Who has discussed potential hospice or palliative care evaluation. Patient was recommended to present to emergency department so that the father workup could be done. Patient currently resides with her 55 year old mother. Scan today showed progression of her tumor burden 7 .4 cm necrotic pelvic mass arising from the vaginal cuff, Increased. 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 Suspected involvement of the bladder. Moderate right hydroureteronephrosis, likely reflecting extrinsic compression by tumor. Unfortunately patient and the family have very poor insight. Patient has been verbally abusive to the staff and her family. At this point she is concentrated on pain management. Neither patient nor her family at this point is ready to have a conversation about her prognosis stating that "she'll be just fine". Unable to finish interview due to patient becoming agitated and screaming out.  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 UA showed evidence of UTI/ pateint with elevated WBC. Transiently tachycardic. NO nausea or vomiting. She refuses trial of PO pain medications and threatens to leave AMA.  Interim history -Patient continues to need IV pain medication. She will complete another 7 radiation treatments.  Assessment & Plan   Sepsis secondary to UTI -remains stable  Metastatic Endometrial cancer with hydronephrosis/ Cancer associated pain -Case was discussed with palliative care today regarding transition to home hospice. Pain symptoms have been difficult to control and may need IV narcotic analgesics at home for which PICC  line has been ordered.  -Patient refusing to sign consent for PICC line on 05/29/2014, I discussed this further with Palliative Care. Will discuss goals of care further today, including topic of transitioning to hospice care.  -Plan for PICC line placement on Monday -Palliative Care following   Essential hypertension -Currently stable, on no home medications, having last BP of 127/69 -Will continue to monitor  Hypothyroidism -TSH 22.742 (wonder if patient was taking her synthroid) -Continue Synthroid- increased dose to 165mcg daily  Anxiety/Depression/Psychosis -Continue Ativan, sertraline, thiothixene -Consulted psychiatry for medication assistance- consultation recommendations pending  Asthma -Currently stable, continue albuterol PRN  Hypokalemia -A.m. showing a potassium of 2.9, will replace with 4 runs of IV potassium chloride. -Repeat lab work in a.m.   Goals of care  On 05/31/2014 held a 30 minute discussion spent with patient and her mother who was present at bedside discussing goals of care. 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 I explained that this intervention was not curative and was being done for control of symptoms. We discussed transition to hospice, having a PICC line placed for administration of IV narcotics at home. We also discussed CODE STATUS as she remains a full code. I asked if she would want undergo cardiopulmonary resuscitation despite the likelihood of this intervention not changing outcomes and likely leading to significant pain at the end of life. Patient becoming upset with me not wanting to talk about the subjective. She explained to me that there was a cure for everything and hoped that the current  radiation therapy would get her well again.    Code Status: Full Code  Family Communication: Mother at bedside  Disposition Plan: Admitted, Continue palliative radiation- 7 more treatments which occur M-F  Time Spent in minutes: 62  Procedures  Palliative Radiation   Consults   Oncology Radiation oncology [facility], via phone Palliative care  DVT Prophylaxis  lovenox  Lab Results  Component Value Date   PLT 232 06/02/2014    Medications  Scheduled Meds: . ARIPiprazole  5 mg Oral BID  . dexamethasone  4 mg Oral Daily  . docusate sodium  100 mg Oral BID  . enoxaparin (LOVENOX) injection  40 mg Subcutaneous Q24H  . levothyroxine  137 mcg Oral QAC breakfast  . LORazepam  1 mg Oral BID  . morphine  30 mg Oral Q12H  . potassium chloride  40 mEq Oral Once  . sertraline  200 mg Oral QPM   Continuous Infusions:  PRN Meds:.acetaminophen **OR** acetaminophen, albuterol, HYDROcodone-acetaminophen, HYDROmorphone (DILAUDID) injection, HYDROmorphone, loperamide, ondansetron **OR** ondansetron (ZOFRAN) IV, senna-docusate  Antibiotics    Anti-infectives    Start     Dose/Rate Route Frequency Ordered Stop   05/25/14 1931  piperacillin-tazobactam (ZOSYN) IVPB 3.375 g  Status:  Discontinued     3.375 g 100 mL/hr over 30 Minutes Intravenous 3 times per day 05/25/14 1931  05/25/14 1935   05/23/14 0830  piperacillin-tazobactam (ZOSYN) IVPB 3.375 g  Status:  Discontinued     3.375 g 12.5 mL/hr over 240 Minutes Intravenous Every 8 hours 05/23/14 0804 05/27/14 1211   05/22/14 2215  cefTRIAXone (ROCEPHIN) 1 g in dextrose 5 % 50 mL IVPB     1 g 100 mL/hr over 30 Minutes Intravenous  Once 05/22/14 2209 05/22/14 2342        Subjective:   Caren Griffins [place] seen and examined today.  Patient states she continues to be in pain. She states that last night, she thought was a nurse was being mean to her. Patient continuously assess for increase in her Dilaudid. She states she did  not know that she had oral Dilaudid ordered. Patient has no specific complaints at this time.  Objective:   Filed Vitals:   06/01/14 0530 06/01/14 1310 06/01/14 1957 06/02/14 0601  BP: 101/56 127/69 113/74 118/68  Pulse: 79 88 77 70  Temp: 98.4 F (36.9 C) 98.1 F (36.7 C) 98.8 F (37.1 C) 98.8 F (37.1 C)  TempSrc: Oral Oral Oral Oral  Resp: 16 18 16 16   Height:      Weight:      SpO2: 97% 96% 97% 98%    Wt Readings from Last 3 Encounters:  05/24/14 75.297 kg (166 lb)  01/25/14 75.569 kg (166 lb 9.6 oz)  01/06/14 79.243 kg (174 lb 11.2 oz)     Intake/Output Summary (Last 24 hours) at 06/02/14 1353 Last data filed at 06/02/14 1000  Gross per 24 hour  Intake   1320 ml  Output      0 ml  Net   1320 ml    Exam  General: Appears sedated, in no acute distress.   Cardiovascular: S1 S2 auscultated, RRR  Respiratory: Clear auscultation bilaterally  Abdomen: Soft, obese, diffuse tenderness, positive bowel sounds  Extremities: No clubbing cyanosis or edema  Neuro: Awake and oriented.  Gait slow but normal.  Nonfocal  Psych: Depressed/flat   Data Review   Micro Results Recent Results (from the past 240 hour(s))  Culture, blood (routine x 2)     Status: None   Collection Time: 05/23/14 11:05 PM  Result Value Ref Range Status   Specimen Description BLOOD RIGHT ARM  Final   Special Requests BOTTLES DRAWN AEROBIC ONLY 10CC  Final   Culture   Final    NO GROWTH 5 DAYS Performed at [facility]    Report Status 05/30/2014 FINAL  Final  Culture, blood (routine x 2)     Status: None   Collection Time: 05/23/14 11:10 PM  Result Value Ref Range Status   Specimen Description BLOOD RIGHT HAND  Final   Special Requests BOTTLES DRAWN AEROBIC ONLY [place]  Final   Culture   Final    NO GROWTH 5 DAYS Performed at [facility]    Report Status 05/30/2014 FINAL  Final  Culture, Urine     Status: None   Collection Time: 05/25/14 12:30 PM  Result Value Ref  Range Status   Specimen Description URINE, CLEAN CATCH  Final   Special Requests NONE  Final   Colony Count NO GROWTH Performed at [facility]   Final   Culture NO GROWTH Performed at [facility]   Final   Report Status 05/27/2014 FINAL  Final  Clostridium Difficile by PCR     Status: None   Collection Time: 05/28/14 10:11 PM  Result Value Ref Range  Status   C difficile by pcr NEGATIVE NEGATIVE Final    Radiology Reports Ct Abdomen Pelvis W Contrast  05/22/2014   CLINICAL DATA:  Endometrial cancer, nausea, pelvic drainage  EXAM: CT ABDOMEN AND PELVIS WITH CONTRAST  TECHNIQUE: Multidetector CT imaging of the abdomen and pelvis was performed using the standard protocol following bolus administration of intravenous contrast.  CONTRAST:  150mL OMNIPAQUE IOHEXOL 300 MG/ML SOLN, 7mL OMNIPAQUE IOHEXOL 300 MG/ML SOLN  COMPARISON:  03/06/2014  FINDINGS: Lower chest:  Lung bases are clear.  Hepatobiliary: Liver is within normal limits.  Gallbladder is unremarkable. No intrahepatic or extrahepatic ductal dilatation.  Pancreas: Within normal limits.  Spleen: Splenomegaly, measuring 15.5 cm in maximal dimension.  Adrenals/Urinary Tract: Adrenal glands are within normal limits.  Left kidney is within normal limits.  Malrotated right kidney. Moderate hydroureteronephrosis, likely secondary to extrinsic compression.  Thick-walled bladder.  Stomach/Bowel: Stomach is within normal limits.  No evidence of bowel obstruction.  Appendix is not discretely visualized and may be surgically absent.  Moderate colonic stool burden, suggesting constipation.  Sigmoid colon is thick-walled and likely involved via the pelvic mass (described below).  Mass like thickening involving the rectum (series 2/image 57), new.  Vascular/Lymphatic: No evidence of abdominal aortic aneurysm.  Small subcentimeter retroperitoneal nodes, indeterminate.  Scattered nodes along the sigmoid mesocolon measuring up to 10 mm (series  2/image 52), along with bilateral internal iliac nodes measuring 19-20 mm (series 2/image 57), compatible with nodal metastases.  Reproductive: Status post hysterectomy.  7.4 x 5.7 x 6.1 cm necrotic pelvic mass (series 2/image 62), likely arising from the vaginal cuff, previously 3.2 x 5.6 cm. Gas within the lesion suggest direct communication with the sigmoid colon/ rectum. Additionally, the mass abuts the posterior wall of the bladder, which is also likely involved.  Other: No abdominopelvic ascites.  Musculoskeletal: Degenerative changes of the thoracolumbar spine, most prominent at L5-S1.  IMPRESSION: 7.4 cm necrotic pelvic mass arising from the vaginal cuff, increased.  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 Suspected involvement of the bladder.  Associated pelvic nodal metastases.  Moderate right hydroureteronephrosis, likely reflecting extrinsic compression by tumor.   Electronically Signed   By: Julian Hy M.D.   On: 05/22/2014 21:59    CBC  Recent Labs Lab 05/27/14 0500 05/28/14 0544 06/02/14 0455  WBC 9.9 9.0 7.1  HGB 9.5* 9.3* 9.1*  HCT 29.3* 28.7* 27.7*  PLT 227 241 232  MCV 87.5 87.8 86.6  MCH 28.4 28.4 28.4  MCHC 32.4 32.4 32.9  RDW 13.3 13.5 13.7    Chemistries   Recent Labs Lab 05/27/14 0500 05/28/14 0544 05/31/14 0410 06/02/14 0455  NA 138 139 137 141  K 3.0* 3.2* 2.9* 3.5  CL 104 106 104 106  CO2 25 25 27 26   GLUCOSE 99 92 96 90  BUN 8 8 8 10   CREATININE 0.94 0.81 0.75 0.74  CALCIUM 8.6 8.4 8.2* 8.9   ------------------------------------------------------------------------------------------------------------------ estimated creatinine clearance is 65.4 mL/min (by C-G formula based on Cr of 0.74). ------------------------------------------------------------------------------------------------------------------ No results for input(s): HGBA1C in the last 72  hours. ------------------------------------------------------------------------------------------------------------------ No results for input(s): CHOL, HDL, LDLCALC, TRIG, CHOLHDL, LDLDIRECT in the last 72 hours. ------------------------------------------------------------------------------------------------------------------ No results for input(s): TSH, T4TOTAL, T3FREE, THYROIDAB in the last 72 hours.  Invalid input(s): FREET3 ------------------------------------------------------------------------------------------------------------------ No results for input(s): VITAMINB12, FOLATE, FERRITIN, TIBC, IRON, RETICCTPCT in the last 72 hours.  Coagulation profile No results for input(s): INR, PROTIME in the last 168 hours.  No results for input(s):  DDIMER in the last 72 hours.  Cardiac Enzymes No results for input(s): CKMB, TROPONINI, MYOGLOBIN in the last 168 hours.  Invalid input(s): CK ------------------------------------------------------------------------------------------------------------------ Invalid input(s): POCBNP    Porschia Willbanks D.O. on 06/02/2014 at 1:53 PM  Between 7am to 7pm - Pager - [phone]  After 7pm go to [email or web address] - password [email or web address]  And look for the night coverage person covering for me after hours  [facility] Office  [phone]

## 2014-06-02 NOTE — Progress Notes (Signed)
On rounds this RN questioned about time frame for patient's sister arriving with clothing for patient's mother who is at bedside. However, both patient and mother report that the sister is not coming and they never said she would as she is too busy at this time.  They state that she came yesterday and dropped off clothing at that time. However, this RN was here yesterday and did not note any visitor's and am report noted that the sister was due to arrive today. Mother and patient maintain that mother has fresh clothing and can change as needed.

## 2014-06-02 NOTE — Progress Notes (Signed)
Rn spoke to case manager regarding concerns about patient's mother who has been present at bedside throughout admission.  Patient and mother report they live together, however patient's mother noted to be incontinent of urine with reports from other staff of puddles of urine needing to be cleaned up off floor around mother's chair.  This RN notes a stale smell of urine in room and questions if patient;s mother is receiving adequate care at home.  Noted that the mother;s closes were soiled on morning rounds.  Case manager to follow up with patient.  Of note, it was reported to this RN that patient's sister is coming today to bring fresh clothing to patient's mother who refused to change after incontinent episodes during the night. RN to continue to monitor and case manager to follow up.

## 2014-06-03 ENCOUNTER — Ambulatory Visit
Admit: 2014-06-03 | Discharge: 2014-06-03 | Disposition: A | Discharge status: Home / Self Care | Payer: Medicaid Other | Attending: Radiation Oncology | Admitting: Radiation Oncology

## 2014-06-03 NOTE — Progress Notes (Signed)
[facility] Therapy Treatment Record Phone [phone]   Radiation Therapy was administered to [name] on: 06/03/2014  10:06 AM and was treatment # 7 out of a planned course of 10 treatments.

## 2014-06-03 NOTE — Progress Notes (Signed)
[facility]                                                                              Patient Demographics  [name], is a 59 y.o. female, DOB - 1956-02-21, RCV:[id number]  Admit date - 05/22/2014   Admitting Physician [name], MD  Outpatient Primary MD for the patient is [name], FNP  LOS - 3   Chief Complaint  Patient presents with  . Abdominal Pain  . Nausea  . Emesis  . Constipation      HPI on 05/22/2014 by Dr. [name] Patient with complex medical hx. extensive history of noncompliance and drug seeking behavior and polysubstance abuse. 2013 patient was found to have early stage endometrial cancer. 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 Patient did not follow up with gyn oncology.  In 2015 she presented with vaginal bleeding, pelvic pain and discharge. 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  CT at that time showed new lobulated soft tissue mass involving vaginal cuff and posterior wall of the urinary bladder, with areas of internal necrosis, measuring 3.7 x 6.9 cm; there was also new pelvic adenopathy at iliac bifurcations bilaterally, but no obvious disease outside of pelvis, including nothing involving bowels, and no hydronephrosis. Patient had an admission for diverticulitis in October 2015 and ended time was able to be seen in-house by radiation oncology and oncology which point palliative radiation therapy has been started. Patient has been going to her treatments intermittently. She has completed only about 1.5 [name] has 6 treatments. Radiation oncologist started her on Dilaudid 4 mg as needed for pain. She is supposed to follow-up with pain clinic but canceled her appointment. Patient continued to  have worsening abdominal pain but started to have stabbing pain in her vaginal and rectal area. She have had frequent urination in seen pus from her rectal area. She has been intermittently tearful. Reports low-grade fevers. Today she was seen again by radiation oncology. Who has discussed potential hospice or palliative care evaluation. Patient was recommended to present to emergency department so that the father workup could be done. Patient currently resides with her 16 year old mother. Scan today showed progression of her tumor burden 7 .4 cm necrotic pelvic mass arising from the vaginal cuff, Increased. 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 Suspected involvement of the bladder. Moderate right hydroureteronephrosis, likely reflecting extrinsic compression by tumor. Unfortunately patient and the family have very poor insight. Patient has been verbally abusive to the staff and her family. At this point she is concentrated on pain management. Neither patient nor her family at this point is ready to have a conversation about her prognosis stating that "she'll be just fine". Unable to finish interview due to patient becoming agitated and screaming out.  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 UA showed evidence of UTI/ pateint with elevated WBC. Transiently tachycardic. NO nausea or vomiting. She refuses trial of PO pain medications and threatens to leave AMA.  Interim history -Patient continues to need IV pain medication. She will complete another 7 radiation treatments.  Assessment & Plan   Sepsis secondary to UTI -remains stable  Metastatic Endometrial cancer with hydronephrosis/ Cancer associated pain -Case was discussed with palliative care today regarding transition to home hospice. Pain symptoms have been difficult to control and may need IV narcotic analgesics at home for which PICC  line has been ordered.  -Patient refusing to sign consent for PICC line on 05/29/2014, I discussed this further with Palliative Care. Will discuss goals of care further today, including topic of transitioning to hospice care.  -Plan for PICC line placement today, order placed -Palliative Care following   Essential hypertension -Currently stable, on no home medications, having last BP of 127/69 -Will continue to monitor  Hypothyroidism -TSH 22.742 (wonder if patient was taking her synthroid) -Continue Synthroid- increased dose to 117mcg daily  Anxiety/Depression/Psychosis -Continue Ativan, sertraline, thiothixene -Consulted psychiatry for medication assistance- consultation recommendations pending  Asthma -Currently stable, continue albuterol PRN  Hypokalemia -A.m. showing a potassium of 2.9, will replace with 4 runs of IV potassium chloride. -Repeat lab work in a.m.   Goals of care  On 05/31/2014 held a 30 minute discussion spent with patient and her mother who was present at bedside discussing goals of care. 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 I explained that this intervention was not curative and was being done for control of symptoms. We discussed transition to hospice, having a PICC line placed for administration of IV narcotics at home. We also discussed CODE STATUS as she remains a full code. I asked if she would want undergo cardiopulmonary resuscitation despite the likelihood of this intervention not changing outcomes and likely leading to significant pain at the end of life. Patient becoming upset with me not wanting to talk about the subjective. She explained to me that there was a cure for everything and hoped that the  current radiation therapy would get her well again.    Code Status: Full Code  Family Communication: Mother at bedside  Disposition Plan: Admitted, Continue palliative radiation- 7 more treatments which occur M-F  Time Spent in minutes: 94  Procedures  Palliative Radiation   Consults   Oncology Radiation oncology [facility], via phone Palliative care  DVT Prophylaxis  lovenox  Lab Results  Component Value Date   PLT 232 06/02/2014    Medications  Scheduled Meds: . ARIPiprazole  5 mg Oral BID  . dexamethasone  4 mg Oral Daily  . docusate sodium  100 mg Oral BID  . enoxaparin (LOVENOX) injection  40 mg Subcutaneous Q24H  . levothyroxine  137 mcg Oral QAC breakfast  . LORazepam  1 mg Oral BID  . morphine  30 mg Oral Q12H  . potassium chloride  40 mEq Oral Once  . sertraline  200 mg Oral QPM   Continuous Infusions:  PRN Meds:.acetaminophen **OR** acetaminophen, albuterol, HYDROcodone-acetaminophen, HYDROmorphone (DILAUDID) injection, HYDROmorphone, loperamide, ondansetron **OR** ondansetron (ZOFRAN) IV, senna-docusate  Antibiotics    Anti-infectives    Start     Dose/Rate Route Frequency Ordered Stop   05/25/14 1931  piperacillin-tazobactam (ZOSYN) IVPB 3.375 g  Status:  Discontinued     3.375 g 100 mL/hr over 30 Minutes Intravenous 3 times per day 05/25/14  1931 05/25/14 1935   05/23/14 0830  piperacillin-tazobactam (ZOSYN) IVPB 3.375 g  Status:  Discontinued     3.375 g 12.5 mL/hr over 240 Minutes Intravenous Every 8 hours 05/23/14 0804 05/27/14 1211   05/22/14 2215  cefTRIAXone (ROCEPHIN) 1 g in dextrose 5 % 50 mL IVPB     1 g 100 mL/hr over 30 Minutes Intravenous  Once 05/22/14 2209 05/22/14 2342        Subjective:   [name] seen and examined today. She is sedated from receiving narcotic analgesics.   Objective:   Filed Vitals:   06/02/14 1255 06/02/14 2024 06/03/14 0435 06/03/14 1327  BP: 108/68 107/62 129/81 100/60  Pulse: 74  69 78 66  Temp: 98.5 F (36.9 C) 98.3 F (36.8 C) 98.3 F (36.8 C) 98.4 F (36.9 C)  TempSrc: Oral Oral Oral Oral  Resp: 18 16 14 18   Height:      Weight:      SpO2: 97% 96% 99% 99%    Wt Readings from Last 3 Encounters:  05/24/14 75.297 kg (166 lb)  01/25/14 75.569 kg (166 lb 9.6 oz)  01/06/14 79.243 kg (174 lb 11.2 oz)     Intake/Output Summary (Last 24 hours) at 06/03/14 1623 Last data filed at 06/02/14 1706  Gross per 24 hour  Intake    720 ml  Output      0 ml  Net    720 ml    Exam  General: Appears sedated, in no acute distress.   Cardiovascular: S1 S2 auscultated, RRR  Respiratory: Clear auscultation bilaterally  Abdomen: Soft, obese, diffuse tenderness, positive bowel sounds  Extremities: No clubbing cyanosis or edema  Neuro: Awake and oriented.  Gait slow but normal.  Nonfocal  Psych: Depressed/flat   Data Review   Micro Results Recent Results (from the past 240 hour(s))  Culture, Urine     Status: None   Collection Time: 05/25/14 12:30 PM  Result Value Ref Range Status   Specimen Description URINE, CLEAN CATCH  Final   Special Requests NONE  Final   Colony Count NO GROWTH Performed at [facility]   Final   Culture NO GROWTH Performed at [facility]   Final   Report Status 05/27/2014 FINAL  Final  Clostridium Difficile by PCR     Status: None   Collection Time: 05/28/14 10:11 PM  Result Value Ref Range Status   C difficile by pcr NEGATIVE NEGATIVE Final    Radiology Reports Ct Abdomen Pelvis W Contrast  05/22/2014   CLINICAL DATA:  Endometrial cancer, nausea, pelvic drainage  EXAM: CT ABDOMEN AND PELVIS WITH CONTRAST  TECHNIQUE: Multidetector CT imaging of the abdomen and pelvis was performed using the standard protocol following bolus administration of intravenous contrast.  CONTRAST:  122mL OMNIPAQUE IOHEXOL 300 MG/ML SOLN, 36mL OMNIPAQUE IOHEXOL 300 MG/ML SOLN  COMPARISON:  03/06/2014  FINDINGS: Lower chest:  Lung  bases are clear.  Hepatobiliary: Liver is within normal limits.  Gallbladder is unremarkable. No intrahepatic or extrahepatic ductal dilatation.  Pancreas: Within normal limits.  Spleen: Splenomegaly, measuring 15.5 cm in maximal dimension.  Adrenals/Urinary Tract: Adrenal glands are within normal limits.  Left kidney is within normal limits.  Malrotated right kidney. Moderate hydroureteronephrosis, likely secondary to extrinsic compression.  Thick-walled bladder.  Stomach/Bowel: Stomach is within normal limits.  No evidence of bowel obstruction.  Appendix is not discretely visualized and may be surgically absent.  Moderate colonic stool burden, suggesting constipation.  Sigmoid colon  is thick-walled and likely involved via the pelvic mass (described below).  Mass like thickening involving the rectum (series 2/image 57), new.  Vascular/Lymphatic: No evidence of abdominal aortic aneurysm.  Small subcentimeter retroperitoneal nodes, indeterminate.  Scattered nodes along the sigmoid mesocolon measuring up to 10 mm (series 2/image 52), along with bilateral internal iliac nodes measuring 19-20 mm (series 2/image 57), compatible with nodal metastases.  Reproductive: Status post hysterectomy.  7.4 x 5.7 x 6.1 cm necrotic pelvic mass (series 2/image 62), likely arising from the vaginal cuff, previously 3.2 x 5.6 cm. Gas within the lesion suggest direct communication with the sigmoid colon/ rectum. Additionally, the mass abuts the posterior wall of the bladder, which is also likely involved.  Other: No abdominopelvic ascites.  Musculoskeletal: Degenerative changes of the thoracolumbar spine, most prominent at L5-S1.  IMPRESSION: 7.4 cm necrotic pelvic mass arising from the vaginal cuff, increased.  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 Suspected involvement of the bladder.  Associated pelvic nodal metastases.  Moderate right hydroureteronephrosis, likely reflecting extrinsic compression  by tumor.   Electronically Signed   By: Julian Hy M.D.   On: 05/22/2014 21:59    CBC  Recent Labs Lab 05/28/14 0544 06/02/14 0455  WBC 9.0 7.1  HGB 9.3* 9.1*  HCT 28.7* 27.7*  PLT 241 232  MCV 87.8 86.6  MCH 28.4 28.4  MCHC 32.4 32.9  RDW 13.5 13.7    Chemistries   Recent Labs Lab 05/28/14 0544 05/31/14 0410 06/02/14 0455  NA 139 137 141  K 3.2* 2.9* 3.5  CL 106 104 106  CO2 25 27 26   GLUCOSE 92 96 90  BUN 8 8 10   CREATININE 0.81 0.75 0.74  CALCIUM 8.4 8.2* 8.9   ------------------------------------------------------------------------------------------------------------------ estimated creatinine clearance is 65.4 mL/min (by C-G formula based on Cr of 0.74). ------------------------------------------------------------------------------------------------------------------ No results for input(s): HGBA1C in the last 72 hours. ------------------------------------------------------------------------------------------------------------------ No results for input(s): CHOL, HDL, LDLCALC, TRIG, CHOLHDL, LDLDIRECT in the last 72 hours. ------------------------------------------------------------------------------------------------------------------ No results for input(s): TSH, T4TOTAL, T3FREE, THYROIDAB in the last 72 hours.  Invalid input(s): FREET3 ------------------------------------------------------------------------------------------------------------------ No results for input(s): VITAMINB12, FOLATE, FERRITIN, TIBC, IRON, RETICCTPCT in the last 72 hours.  Coagulation profile No results for input(s): INR, PROTIME in the last 168 hours.  No results for input(s): DDIMER in the last 72 hours.  Cardiac Enzymes No results for input(s): CKMB, TROPONINI, MYOGLOBIN in the last 168 hours.  Invalid input(s): CK ------------------------------------------------------------------------------------------------------------------ Invalid input(s): POCBNP    [name],  [name] D.O. on 06/03/2014 at 4:23 PM  Between 7am to 7pm - Pager - [phone]  After 7pm go to [email or web address] - password [email or web address]  And look for the night coverage person covering for me after hours  [facility] Office  [phone]

## 2014-06-03 NOTE — Progress Notes (Signed)
Leadership rounding on patient. She asked what my purpose was and I explained again that I was the nursing leader and that I was checking to make sure she was getting what she needed from the staff.  She stated "sometimes I hit the button and no one answers" Then she requested a Coke - there was an unopened can on her bedside table -I asked her if she wanted ice and she proceeded to raise her voice and tell me she didn't like ice. She asked if there was something wrong with me getting her the Coke and  I told her No and I would get her a cold coke from the refrigerator.  When I returned with the Coke she was on the call light requesting someone else bring her a Coke.  I gave her the Coke, a cup and a straw and she began yelling that she didn't like my bedside manner.  I explained that the last 3 times I have rounded on her she has yelled.  Her mother stated "She yells at me too but I love her anyway". I explained that it was not appropriate for her to yell at the staff.  Patient continued to yell at which point I excused myself from the room.

## 2014-06-04 ENCOUNTER — Ambulatory Visit
Admit: 2014-06-04 | Discharge: 2014-06-04 | Disposition: A | Discharge status: Home / Self Care | Payer: Medicaid Other | Attending: Radiation Oncology | Admitting: Radiation Oncology

## 2014-06-04 DIAGNOSIS — C541 Malignant neoplasm of endometrium: Secondary | ICD-10-CM

## 2014-06-04 NOTE — Progress Notes (Signed)
Progress Note from the Palliative Medicine Team at [facility]:   -patient is calm and cooperative, she sits up at side of bed for conversation, she is impatient and agitated with her mother who has been staying at her bedside entire hospitalization  -continued discussion about plan of care on discharge, patient remains open to hospice services.  Attempted to gather information regarding home environment, both patient and her mother are guarded  -she refuses permission to allow me to contact her sister to assist with collaboration in discharge palnning    Objective: Allergies  Allergen Reactions  . Sulfa Antibiotics Other (See Comments)    Unknown-pt states she is unsure of what her reaction is  . Sulfamethoxazole-Trimethoprim Other (See Comments)    Unknown reaction   Scheduled Meds: . ARIPiprazole  5 mg Oral BID  . dexamethasone  4 mg Oral Daily  . docusate sodium  100 mg Oral BID  . enoxaparin (LOVENOX) injection  40 mg Subcutaneous Q24H  . levothyroxine  137 mcg Oral QAC breakfast  . LORazepam  1 mg Oral BID  . potassium chloride  40 mEq Oral Once  . sertraline  200 mg Oral QPM   Continuous Infusions:  PRN Meds:.acetaminophen **OR** acetaminophen, albuterol, HYDROcodone-acetaminophen, HYDROmorphone (DILAUDID) injection, HYDROmorphone, loperamide, ondansetron **OR** ondansetron (ZOFRAN) IV, senna-docusate  BP 119/68 mmHg  Pulse 72  Temp(Src) 98 F (36.7 C) (Oral)  Resp 20  Ht 4\' 10"  (1.473 m)  Wt 75.297 kg (166 lb)  BMI 34.70 kg/m2  SpO2 98%   PPS:50 %  Pain Score: cry when asked to rate, "it is horrible"  Pain Location abdomen, radiating into pelvis ad into back and thighs    Intake/Output Summary (Last 24 hours) at 06/04/14 1430 Last data filed at 06/04/14 1243  Gross per 24 hour  Intake    660 ml  Output      0 ml  Net    660 ml       Physical Exam:  General: chronically ill appearing, NAD, disheveled Mouth: moist buccal membranes, poor  dentation Abdomen: distended and firm, tender to gentle palpations   Labs: CBC    Component Value Date/Time   WBC 7.1 06/02/2014 0455   WBC 5.0 02/18/2014 1521   RBC 3.20* 06/02/2014 0455   RBC 4.45 02/18/2014 1521   HGB 9.1* 06/02/2014 0455   HGB 12.9 02/18/2014 1521   HCT 27.7* 06/02/2014 0455   HCT 38.8 02/18/2014 1521   PLT 232 06/02/2014 0455   PLT 163 02/18/2014 1521   MCV 86.6 06/02/2014 0455   MCV 87.3 02/18/2014 1521   MCH 28.4 06/02/2014 0455   MCH 28.9 02/18/2014 1521   MCHC 32.9 06/02/2014 0455   MCHC 33.2 02/18/2014 1521   RDW 13.7 06/02/2014 0455   RDW 14.3 02/18/2014 1521   LYMPHSABS 1.4 05/22/2014 1823   LYMPHSABS 1.5 02/18/2014 1521   MONOABS 1.2* 05/22/2014 1823   MONOABS 0.5 02/18/2014 1521   EOSABS 0.5 05/22/2014 1823   EOSABS 0.1 02/18/2014 1521   BASOSABS 0.0 05/22/2014 1823   BASOSABS 0.0 02/18/2014 1521    BMET    Component Value Date/Time   NA 141 06/02/2014 0455   NA 139 02/18/2014 1521   K 3.5 06/02/2014 0455   K 4.2 02/18/2014 1521   CL 106 06/02/2014 0455   CO2 26 06/02/2014 0455   CO2 27 02/18/2014 1521   GLUCOSE 90 06/02/2014 0455   GLUCOSE 93 02/18/2014 1521   BUN 10 06/02/2014 0455  BUN 7.8 02/18/2014 1521   CREATININE 0.74 06/02/2014 0455   CREATININE 0.7 02/18/2014 1521   CALCIUM 8.9 06/02/2014 0455   CALCIUM 9.9 02/18/2014 1521   GFRNONAA >90 06/02/2014 0455   GFRAA >90 06/02/2014 0455    CMP     Component Value Date/Time   NA 141 06/02/2014 0455   NA 139 02/18/2014 1521   K 3.5 06/02/2014 0455   K 4.2 02/18/2014 1521   CL 106 06/02/2014 0455   CO2 26 06/02/2014 0455   CO2 27 02/18/2014 1521   GLUCOSE 90 06/02/2014 0455   GLUCOSE 93 02/18/2014 1521   BUN 10 06/02/2014 0455   BUN 7.8 02/18/2014 1521   CREATININE 0.74 06/02/2014 0455   CREATININE 0.7 02/18/2014 1521   CALCIUM 8.9 06/02/2014 0455   CALCIUM 9.9 02/18/2014 1521   PROT 6.7 05/23/2014 0605   ALBUMIN 2.9* 05/23/2014 0605   AST 20 05/23/2014  0605   ALT 13 05/23/2014 0605   ALKPHOS 57 05/23/2014 0605   BILITOT 0.6 05/23/2014 0605   GFRNONAA >90 06/02/2014 0455   GFRAA >90 06/02/2014 0455     Assessment and Plan: 1. Code Status: Full code  2. Symptom Control:        Pain: Continue Dilaudid 1 mg IV every 2 hrs prn                  Dc  Morphine ER 30 mg po BID-- patient reports it does not help and refuses to take at time Agrees to PICC placement for anticipated discharge on Thursday  3. Psycho/Social:  Emotional support to patient and her mother.  Mother is confused at times and has limited insight into her daughter's situation.  Complicated psycho-social situation.   4. Spiritual  Chalplain involved   5. Disposition:  Discussed with patient possibility of hospice services on discharge.  I believe both pateint and family would benefit greatly from hospice services.   Time In Time Out Total Time Spent with Patient Total Overall Time  1430 1505 35 min 35 min    Greater than 50%  of this time was spent counseling and coordinating care related to the above assessment and plan.  Wadie Lessen NP  Palliative Medicine Team Team Phone # [phone] Pager [phone]  Discussed with Dr Coralyn Pear 1

## 2014-06-04 NOTE — Progress Notes (Signed)
CSW continuing to follow.   CSW received update from MD and PMT NP and discussed hopeful plan for transition home with hospice when medically ready and PICC line placement planned for today. Pt continuing to receive radiation treatment and has completed planned 8 out of 10 treatments.    Per MD, MD request CSW to have APS involvement initiated upon discharge due to concerns of pt and pt mother being able to care for themselves and each other at home upon discharge.   CSW to continue to follow and will notify APS of pt case once pt medically ready for discharge.  Alison Murray, MSW, [place] [facility] [phone]

## 2014-06-04 NOTE — Progress Notes (Signed)
At bedside to place PICC as ordered.  Risks, benefit and purpose explained.  Once patient heard the risk (blood clot, infection) she stated that she did not want the PICC line.  Explained to patient that all precautions are taken to prevent these things from happening, she stated "I'm scared.  I don't want to get a blood clot".  Again explained to patient that we do all that we can to prevent these things from occurring, she stated that she "didn't want to do it because that was too much risk".  Patient's family member in room told her that "there must be some good with it if the doctor wants it".  Reiterated that purpose of the PICC (to be able to receive medications after discharge from the hospital) but continues to states that she does not want it.  She requested that the MD speak with her tomorrow about a different plan. Ailene Ravel, RN updated. Yasmin Dibello, Nicolette Bang, RN VAST

## 2014-06-04 NOTE — Progress Notes (Signed)
[facility]                                                                              Patient Demographics  [name], is a 59 y.o. female, DOB - June 24, 1955, XTG:[id number]  Admit date - 05/22/2014   Admitting Physician [name], MD  Outpatient Primary MD for the patient is [name], FNP  LOS - 12   Chief Complaint  Patient presents with  . Abdominal Pain  . Nausea  . Emesis  . Constipation      HPI on 05/22/2014 by Dr. [name] Patient with complex medical hx. extensive history of noncompliance and drug seeking behavior and polysubstance abuse. 2013 patient was found to have early stage endometrial cancer. 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 Patient did not follow up with gyn oncology.  In 2015 she presented with vaginal bleeding, pelvic pain and discharge. 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  CT at that time showed new lobulated soft tissue mass involving vaginal cuff and posterior wall of the urinary bladder, with areas of internal necrosis, measuring 3.7 x 6.9 cm; there was also new pelvic adenopathy at iliac bifurcations bilaterally, but no obvious disease outside of pelvis, including nothing involving bowels, and no hydronephrosis. Patient had an admission for diverticulitis in October 2015 and ended time was able to be seen in-house by radiation oncology and oncology which point palliative radiation therapy has been started. Patient has been going to her treatments intermittently. She has completed only about 1.5 [name] has 6 treatments. Radiation oncologist started her on Dilaudid 4 mg as needed for pain. She is supposed to follow-up with pain clinic but canceled her appointment. Patient continued to  have worsening abdominal pain but started to have stabbing pain in her vaginal and rectal area. She have had frequent urination in seen pus from her rectal area. She has been intermittently tearful. Reports low-grade fevers. Today she was seen again by radiation oncology. Who has discussed potential hospice or palliative care evaluation. Patient was recommended to present to emergency department so that the father workup could be done. Patient currently resides with her 33 year old mother. Scan today showed progression of her tumor burden 7 .4 cm necrotic pelvic mass arising from the vaginal cuff, Increased. 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 Suspected involvement of the bladder. Moderate right hydroureteronephrosis, likely reflecting extrinsic compression by tumor. Unfortunately patient and the family have very poor insight. Patient has been verbally abusive to the staff and her family. At this point she is concentrated on pain management. Neither patient nor her family at this point is ready to have a conversation about her prognosis stating that "she'll be just fine". Unable to finish interview due to patient becoming agitated and screaming out.  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 UA showed evidence of UTI/ pateint with elevated WBC. Transiently tachycardic. NO nausea or vomiting. She refuses trial of PO pain medications and threatens to leave AMA.  Interim history 3/2-3/8 -Patient over the course of the week having difficulties accepting her diagnosis and prognosis. I attempted to discuss CODE STATUS with her on several occasions, with her becoming upset and tearful, stating "anything can be cured." She remains a full code. Palliative care has been involved as well, recommending PICC line placement which was supposed to happen last Friday, however, at the time she refused to sign  consent. Today is is amendable to PICC line placement. She had been receiving her daily Radiation treatments, as today represents day 8 out of 10. The plan is for her to go home on Thurs after her last dose of radiation treatment. Palliative care is setting up home hospice which will manage her IV pain meds.   Assessment & Plan   Sepsis secondary to UTI -remains stable  Metastatic Endometrial cancer with hydronephrosis/ Cancer associated pain -Case was discussed with palliative care today regarding transition to home hospice. Pain symptoms have been difficult to control and may need IV narcotic analgesics at home for which PICC line has been ordered.  -Patient refusing to sign consent for PICC line on 05/29/2014, I discussed this further with Palliative Care. Will discuss goals of care further today, including topic of transitioning to hospice care.  -Plan for PICC line placement today, order placed -Palliative Care following   Essential hypertension -Currently stable, on no home medications -Will continue to monitor  Hypothyroidism -TSH 22.742 (wonder if patient was taking her synthroid) -Continue Synthroid- increased dose to 110mcg daily  Anxiety/Depression/Psychosis -Continue Ativan, sertraline, thiothixene -Consulted psychiatry for medication assistance- consultation recommendations pending  Asthma -Currently stable, continue albuterol PRN  Hypokalemia -A.m. showing a potassium of 2.9, will replace with 4 runs of IV potassium chloride. -Repeat lab work in a.m.   Goals of care  On 05/31/2014 held a 30 minute discussion spent with patient and her mother who was present at bedside discussing goals of care. 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 I explained that this intervention was not curative and was being done for control of symptoms. We discussed transition to hospice, having a PICC line placed for administration of IV narcotics at home. We also discussed CODE STATUS as she remains a full code. I asked if she would want undergo cardiopulmonary resuscitation despite the likelihood of this intervention not changing outcomes and likely leading to significant pain at the end of life. Patient becoming upset with me not wanting to talk about the subjective. She explained to me that there was a cure for everything and hoped that the current radiation therapy would get her well again.    Code Status: Full Code  Family Communication: Mother at bedside  Disposition Plan: Admitted, Continue palliative radiation- 7 more treatments which occur M-F  Time Spent in minutes: 52  Procedures  Palliative Radiation   Consults   Oncology Radiation oncology [facility], via phone Palliative care  DVT Prophylaxis  lovenox  Lab Results  Component Value Date   PLT 232 06/02/2014    Medications  Scheduled Meds: . ARIPiprazole  5 mg Oral BID  . dexamethasone  4 mg Oral Daily  . docusate sodium  100 mg Oral BID  . enoxaparin (LOVENOX) injection  40 mg Subcutaneous Q24H  . levothyroxine  137 mcg Oral QAC  breakfast  . LORazepam  1 mg Oral BID  . potassium chloride  40 mEq Oral Once  . sertraline  200 mg Oral QPM   Continuous Infusions:  PRN Meds:.acetaminophen **OR** acetaminophen, albuterol, HYDROcodone-acetaminophen, HYDROmorphone (DILAUDID) injection, HYDROmorphone, loperamide, ondansetron **OR** ondansetron (ZOFRAN) IV, senna-docusate  Antibiotics    Anti-infectives    Start     Dose/Rate Route Frequency Ordered Stop   05/25/14 1931  piperacillin-tazobactam (ZOSYN) IVPB 3.375 g  Status:  Discontinued     3.375 g 100 mL/hr over 30 Minutes Intravenous 3 times per day 05/25/14 1931 05/25/14 1935    05/23/14 0830  piperacillin-tazobactam (ZOSYN) IVPB 3.375 g  Status:  Discontinued     3.375 g 12.5 mL/hr over 240 Minutes Intravenous Every 8 hours 05/23/14 0804 05/27/14 1211   05/22/14 2215  cefTRIAXone (ROCEPHIN) 1 g in dextrose 5 % 50 mL IVPB     1 g 100 mL/hr over 30 Minutes Intravenous  Once 05/22/14 2209 05/22/14 2342        Subjective:   [name] seen and examined today. She is sedated from receiving narcotic analgesics.   Objective:   Filed Vitals:   06/03/14 0435 06/03/14 1327 06/03/14 2302 06/04/14 0550  BP: 129/81 100/60 103/64 119/68  Pulse: 78 66 60 72  Temp: 98.3 F (36.8 C) 98.4 F (36.9 C) 98.3 F (36.8 C) 98 F (36.7 C)  TempSrc: Oral Oral Oral Oral  Resp: 14 18 20 20   Height:      Weight:      SpO2: 99% 99% 98% 98%    Wt Readings from Last 3 Encounters:  05/24/14 75.297 kg (166 lb)  01/25/14 75.569 kg (166 lb 9.6 oz)  01/06/14 79.243 kg (174 lb 11.2 oz)     Intake/Output Summary (Last 24 hours) at 06/04/14 1459 Last data filed at 06/04/14 1243  Gross per 24 hour  Intake    660 ml  Output      0 ml  Net    660 ml    Exam  General: Appears sedated, in no acute distress.   Cardiovascular: S1 S2 auscultated, RRR  Respiratory: Clear auscultation bilaterally  Abdomen: Soft, obese, diffuse tenderness, positive bowel sounds  Extremities: No clubbing cyanosis or edema  Neuro: Awake and oriented.  Gait slow but normal.  Nonfocal  Psych: Depressed/flat   Data Review   Micro Results Recent Results (from the past 240 hour(s))  Clostridium Difficile by PCR     Status: None   Collection Time: 05/28/14 10:11 PM  Result Value Ref Range Status   C difficile by pcr NEGATIVE NEGATIVE Final    Radiology Reports Ct Abdomen Pelvis W Contrast  05/22/2014   CLINICAL DATA:  Endometrial cancer, nausea, pelvic drainage  EXAM: CT ABDOMEN AND PELVIS WITH CONTRAST  TECHNIQUE: Multidetector CT imaging of the abdomen and pelvis was performed  using the standard protocol following bolus administration of intravenous contrast.  CONTRAST:  13mL OMNIPAQUE IOHEXOL 300 MG/ML SOLN, 62mL OMNIPAQUE IOHEXOL 300 MG/ML SOLN  COMPARISON:  03/06/2014  FINDINGS: Lower chest:  Lung bases are clear.  Hepatobiliary: Liver is within normal limits.  Gallbladder is unremarkable. No intrahepatic or extrahepatic ductal dilatation.  Pancreas: Within normal limits.  Spleen: Splenomegaly, measuring 15.5 cm in maximal dimension.  Adrenals/Urinary Tract: Adrenal glands are within normal limits.  Left kidney is within normal limits.  Malrotated right kidney. Moderate hydroureteronephrosis, likely secondary to extrinsic compression.  Thick-walled bladder.  Stomach/Bowel: Stomach is within normal limits.  No  evidence of bowel obstruction.  Appendix is not discretely visualized and may be surgically absent.  Moderate colonic stool burden, suggesting constipation.  Sigmoid colon is thick-walled and likely involved via the pelvic mass (described below).  Mass like thickening involving the rectum (series 2/image 57), new.  Vascular/Lymphatic: No evidence of abdominal aortic aneurysm.  Small subcentimeter retroperitoneal nodes, indeterminate.  Scattered nodes along the sigmoid mesocolon measuring up to 10 mm (series 2/image 52), along with bilateral internal iliac nodes measuring 19-20 mm (series 2/image 57), compatible with nodal metastases.  Reproductive: Status post hysterectomy.  7.4 x 5.7 x 6.1 cm necrotic pelvic mass (series 2/image 62), likely arising from the vaginal cuff, previously 3.2 x 5.6 cm. Gas within the lesion suggest direct communication with the sigmoid colon/ rectum. Additionally, the mass abuts the posterior wall of the bladder, which is also likely involved.  Other: No abdominopelvic ascites.  Musculoskeletal: Degenerative changes of the thoracolumbar spine, most prominent at L5-S1.  IMPRESSION: 7.4 cm necrotic pelvic mass arising from the vaginal cuff, increased.   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 Suspected involvement of the bladder.  Associated pelvic nodal metastases.  Moderate right hydroureteronephrosis, likely reflecting extrinsic compression by tumor.   Electronically Signed   By: Julian Hy M.D.   On: 05/22/2014 21:59    CBC  Recent Labs Lab 06/02/14 0455  WBC 7.1  HGB 9.1*  HCT 27.7*  PLT 232  MCV 86.6  MCH 28.4  MCHC 32.9  RDW 13.7    Chemistries   Recent Labs Lab 05/31/14 0410 06/02/14 0455  NA 137 141  K 2.9* 3.5  CL 104 106  CO2 27 26  GLUCOSE 96 90  BUN 8 10  CREATININE 0.75 0.74  CALCIUM 8.2* 8.9   ------------------------------------------------------------------------------------------------------------------ estimated creatinine clearance is 65.4 mL/min (by C-G formula based on Cr of 0.74). ------------------------------------------------------------------------------------------------------------------ No results for input(s): HGBA1C in the last 72 hours. ------------------------------------------------------------------------------------------------------------------ No results for input(s): CHOL, HDL, LDLCALC, TRIG, CHOLHDL, LDLDIRECT in the last 72 hours. ------------------------------------------------------------------------------------------------------------------ No results for input(s): TSH, T4TOTAL, T3FREE, THYROIDAB in the last 72 hours.  Invalid input(s): FREET3 ------------------------------------------------------------------------------------------------------------------ No results for input(s): VITAMINB12, FOLATE, FERRITIN, TIBC, IRON, RETICCTPCT in the last 72 hours.  Coagulation profile No results for input(s): INR, PROTIME in the last 168 hours.  No results for input(s): DDIMER in the last 72 hours.  Cardiac Enzymes No results for input(s): CKMB, TROPONINI, MYOGLOBIN in the last 168 hours.  Invalid input(s):  CK ------------------------------------------------------------------------------------------------------------------ Invalid input(s): POCBNP    Kruze Atchley D.O. on 06/04/2014 at 2:59 PM  Between 7am to 7pm - Pager - [phone]  After 7pm go to [email or web address] - password [email or web address]  And look for the night coverage person covering for me after hours  [facility] Office  [phone]

## 2014-06-04 NOTE — Progress Notes (Signed)
Clinical Social Work  CSW staffed case with psych MD who is signing off. Please re-consult psych if further needs arise.  [place], [place] [phone]

## 2014-06-04 NOTE — Progress Notes (Signed)
Patient remains in hospital. She is in room 1 in hospital bed resting quietly, eyes closed when this RN entered room. She states her pain is "better with the pain medicines". She rates her pain as mild today. She states she "is eating better but needs to eat more". She denies urinary issues, bowel issues.  She is speaking quietly, calmly today.

## 2014-06-04 NOTE — Progress Notes (Signed)
  Radiation Oncology         [phone] ________________________________  Name: MAYRENE BASTARACHE MRN: [id number]  Date: 06/04/2014  DOB: March 12, 1956  Weekly Radiation Therapy Management  Diagnosis: Recurrent endometrial cancer  Current Dose: 24 Gy     Planned Dose:  30 Gy  Narrative . . . . . . . . The patient presents for routine under treatment assessment.                                   The patient seems to be more calm and in less pain today                                 Set-up films were reviewed.                                 The chart was checked. Physical Findings. . . The abdomen is somewhat distended with bowel sounds normal. No rebound or guarding present. Impression . . . . . . . The patient is tolerating radiation. Plan . . . . . . . . . . . . Continue treatment as planned.  ________________________________   Blair Promise, PhD, MD

## 2014-06-05 ENCOUNTER — Ambulatory Visit
Admit: 2014-06-05 | Discharge: 2014-06-05 | Disposition: A | Discharge status: Home / Self Care | Payer: Medicaid Other | Attending: Radiation Oncology | Admitting: Radiation Oncology

## 2014-06-05 MED ORDER — MORPHINE SULFATE ER 30 MG PO TBCR
30.0000 mg | EXTENDED_RELEASE_TABLET | Freq: Two times a day (BID) | ORAL | Status: DC
  Administered 2014-06-05: 30 mg via ORAL
  Filled 2014-06-05: qty 1

## 2014-06-05 MED ORDER — HYDROMORPHONE HCL 1 MG/ML IJ SOLN
1.0000 mg | Freq: Four times a day (QID) | INTRAMUSCULAR | Status: DC | PRN
  Administered 2014-06-06: 1 mg via INTRAVENOUS
  Filled 2014-06-05 (×2): qty 1

## 2014-06-05 NOTE — Progress Notes (Addendum)
[facility] PROGRESS NOTE  [name] ZMO:[id number] DOB: 1955-04-14 DOA: 05/22/2014 PCP: [name], FNP  Summary I have seen and examined [name] at bedside in the presence of her mother and reviewed her chart. Appreciate oncology/radiation oncology/palliative care/hospice. [name] is a pleasant 59 year old female with metastatic endometrial CA undergoing palliative radiation. She is apparently in denial regarding her diagnosis and prognosis. She remains very tearful and her mother still hopes there is something that can be done to meaningfully change the trajectory of her disease. Unfortunately, I do not have anything new to offer, other than what patient has been presented with since admission. Will continue with current plans and discharge once she has completed radiation treatments. Plan Endometrial ca/Cancer associated pain/Pelvic mass  Continue radiation treatments.  Defer to oncology/radiation oncology Adjustment disorder with mixed anxiety and depressed mood/Anxiety state  Supportive care Essential hypertension/Extrinsic asthma  No acute changes DNR (do not resuscitate) discussion/Palliative care encounter  Defer to palliative care/hospice Code Status: Full Family Communication: Mother at bedside Disposition Plan: Eventually home.   Consultants:  Oncology  Radiation oncology  Palliative care  Procedures:    Antibiotics:    HPI/Subjective: Tearful. No specific complaints but has multiple questions regarding her condition.  Objective: Filed Vitals:   06/05/14 2046  BP: 102/67  Pulse: 78  Temp: 99.6 F (37.6 C)  Resp: 16    Intake/Output Summary (Last 24 hours) at 06/05/14 2050 Last data filed at 06/05/14 0100  Gross per 24 hour  Intake    240 ml  Output      0 ml  Net    240 ml   Filed Weights   05/24/14 1100  Weight: 75.297 kg (166 lb)    Exam:   General:  Comfortable at rest but  somnolent.  Cardiovascular: S1-S2 normal. No murmurs. Pulse regular.  Respiratory: Good air entry bilaterally. No rhonchi or rales.  Abdomen: Soft and nontender. Normal bowel sounds. No organomegaly.  Musculoskeletal: No pedal edema   Neurological: Intact  Data Reviewed: Basic Metabolic Panel:  Recent Labs Lab 05/31/14 0410 06/02/14 0455  NA 137 141  K 2.9* 3.5  CL 104 106  CO2 27 26  GLUCOSE 96 90  BUN 8 10  CREATININE 0.75 0.74  CALCIUM 8.2* 8.9   Liver Function Tests: No results for input(s): AST, ALT, ALKPHOS, BILITOT, PROT, ALBUMIN in the last 168 hours. No results for input(s): LIPASE, AMYLASE in the last 168 hours. No results for input(s): AMMONIA in the last 168 hours. CBC:  Recent Labs Lab 06/02/14 0455  WBC 7.1  HGB 9.1*  HCT 27.7*  MCV 86.6  PLT 232   Cardiac Enzymes: No results for input(s): CKTOTAL, CKMB, CKMBINDEX, TROPONINI in the last 168 hours. BNP (last 3 results) No results for input(s): BNP in the last 8760 hours.  ProBNP (last 3 results) No results for input(s): PROBNP in the last 8760 hours.  CBG: No results for input(s): GLUCAP in the last 168 hours.  Recent Results (from the past 240 hour(s))  Clostridium Difficile by PCR     Status: None   Collection Time: 05/28/14 10:11 PM  Result Value Ref Range Status   C difficile by pcr NEGATIVE NEGATIVE Final     Studies: No results found.  Scheduled Meds: . ARIPiprazole  5 mg Oral BID  . dexamethasone  4 mg Oral Daily  . docusate sodium  100 mg Oral BID  . enoxaparin (LOVENOX) injection  40 mg Subcutaneous Q24H  .  levothyroxine  137 mcg Oral QAC breakfast  . LORazepam  1 mg Oral BID  . morphine  30 mg Oral Q12H  . sertraline  200 mg Oral QPM   Continuous Infusions:    Time spent: 25 minutes    Chessica Audia  [facility] Pager [phone]. If 7PM-7AM, please contact night-coverage at [email or web address], password [facility] 06/05/2014, 8:50 PM  LOS: 13 days

## 2014-06-05 NOTE — Care Management Note (Signed)
CARE MANAGEMENT NOTE 06/05/2014  Patient:  [name]   Account Number:  [id number]  Date Initiated:  05/28/2014  Documentation initiated by:  Marney Doctor  Subjective/Objective Assessment:   59 yo admitted with Essential hypertension/recurrent endometroid carcinoma     Action/Plan:   From home with parent   Anticipated DC Date:  06/06/2014   Anticipated DC Plan:  [place]  CM consult      PAC Choice  HOSPICE   Choice offered to / List presented to:  C-1 Patient        HH arranged  HH-10 DISEASE MANAGEMENT      Meta   Status of service:  In process, will continue to follow Medicare Important Message given?   (If response is "NO", the following Medicare IM given date fields will be blank) Date Medicare IM given:   Medicare IM given by:   Date Additional Medicare IM given:   Additional Medicare IM given by:    Discharge Disposition:    Per UR Regulation:  Reviewed for med. necessity/level of care/duration of stay  If discussed at [place] of Stay Meetings, dates discussed:    Comments:  06/05/14 Marney Doctor RN,BSN,NCM CM consult for Home Hospice choice. Met with pt and mother in the room to offer choice for hospice services. Pt informed that Hospice would be a good resource for her pain control at home and could help her if her need for pain medication changed. Pt chose [facility].  Referal called in to the [facility] referral center. Pt to DC to home tomorrow after final palliative radiation treatment. CM will continue to follow.  05/28/14 Marney Doctor RN,BSN,NCM [phone] Chart reviewed and CM following for DC needs.

## 2014-06-05 NOTE — Progress Notes (Signed)
Progress Note from the Palliative Medicine Team at [facility]:   -patient begins to cry when I walk into room "I'm scared, I can't do the PICC'  -continued discussion about plan of care on discharge, patient remains open to hospice services.   -informed patient that today she will be converted to oral  medications in anticipation for dc tomorrow   Objective: Allergies  Allergen Reactions  . Sulfa Antibiotics Other (See Comments)    Unknown-pt states she is unsure of what her reaction is  . Sulfamethoxazole-Trimethoprim Other (See Comments)    Unknown reaction   Scheduled Meds: . ARIPiprazole  5 mg Oral BID  . dexamethasone  4 mg Oral Daily  . docusate sodium  100 mg Oral BID  . enoxaparin (LOVENOX) injection  40 mg Subcutaneous Q24H  . levothyroxine  137 mcg Oral QAC breakfast  . LORazepam  1 mg Oral BID  . morphine  30 mg Oral Q12H  . potassium chloride  40 mEq Oral Once  . sertraline  200 mg Oral QPM   Continuous Infusions:  PRN Meds:.acetaminophen **OR** acetaminophen, albuterol, HYDROmorphone (DILAUDID) injection, HYDROmorphone, loperamide, ondansetron **OR** ondansetron (ZOFRAN) IV, senna-docusate  BP 110/65 mmHg  Pulse 80  Temp(Src) 98.7 F (37.1 C) (Oral)  Resp 18  Ht 4\' 10"  (1.473 m)  Wt 75.297 kg (166 lb)  BMI 34.70 kg/m2  SpO2 95%   PPS:50 %  Pain Score: cry when asked to rate, "it is horrible"  Pain Location abdomen, radiating into pelvis ad into back and thighs    Intake/Output Summary (Last 24 hours) at 06/05/14 0842 Last data filed at 06/05/14 0100  Gross per 24 hour  Intake    720 ml  Output      0 ml  Net    720 ml       Physical Exam:  General: chronically ill appearing, NAD, disheveled Mouth: moist buccal membranes, poor dentation Abdomen: distended and firm, tender to gentle palpations   Labs: CBC    Component Value Date/Time   WBC 7.1 06/02/2014 0455   WBC 5.0 02/18/2014 1521   RBC 3.20* 06/02/2014 0455   RBC 4.45  02/18/2014 1521   HGB 9.1* 06/02/2014 0455   HGB 12.9 02/18/2014 1521   HCT 27.7* 06/02/2014 0455   HCT 38.8 02/18/2014 1521   PLT 232 06/02/2014 0455   PLT 163 02/18/2014 1521   MCV 86.6 06/02/2014 0455   MCV 87.3 02/18/2014 1521   MCH 28.4 06/02/2014 0455   MCH 28.9 02/18/2014 1521   MCHC 32.9 06/02/2014 0455   MCHC 33.2 02/18/2014 1521   RDW 13.7 06/02/2014 0455   RDW 14.3 02/18/2014 1521   LYMPHSABS 1.4 05/22/2014 1823   LYMPHSABS 1.5 02/18/2014 1521   MONOABS 1.2* 05/22/2014 1823   MONOABS 0.5 02/18/2014 1521   EOSABS 0.5 05/22/2014 1823   EOSABS 0.1 02/18/2014 1521   BASOSABS 0.0 05/22/2014 1823   BASOSABS 0.0 02/18/2014 1521    BMET    Component Value Date/Time   NA 141 06/02/2014 0455   NA 139 02/18/2014 1521   K 3.5 06/02/2014 0455   K 4.2 02/18/2014 1521   CL 106 06/02/2014 0455   CO2 26 06/02/2014 0455   CO2 27 02/18/2014 1521   GLUCOSE 90 06/02/2014 0455   GLUCOSE 93 02/18/2014 1521   BUN 10 06/02/2014 0455   BUN 7.8 02/18/2014 1521   CREATININE 0.74 06/02/2014 0455   CREATININE 0.7 02/18/2014 1521   CALCIUM 8.9 06/02/2014  0455   CALCIUM 9.9 02/18/2014 1521   GFRNONAA >90 06/02/2014 0455   GFRAA >90 06/02/2014 0455    CMP     Component Value Date/Time   NA 141 06/02/2014 0455   NA 139 02/18/2014 1521   K 3.5 06/02/2014 0455   K 4.2 02/18/2014 1521   CL 106 06/02/2014 0455   CO2 26 06/02/2014 0455   CO2 27 02/18/2014 1521   GLUCOSE 90 06/02/2014 0455   GLUCOSE 93 02/18/2014 1521   BUN 10 06/02/2014 0455   BUN 7.8 02/18/2014 1521   CREATININE 0.74 06/02/2014 0455   CREATININE 0.7 02/18/2014 1521   CALCIUM 8.9 06/02/2014 0455   CALCIUM 9.9 02/18/2014 1521   PROT 6.7 05/23/2014 0605   ALBUMIN 2.9* 05/23/2014 0605   AST 20 05/23/2014 0605   ALT 13 05/23/2014 0605   ALKPHOS 57 05/23/2014 0605   BILITOT 0.6 05/23/2014 0605   GFRNONAA >90 06/02/2014 0455   GFRAA >90 06/02/2014 0455     Assessment and Plan: 1. Code Status: Full  code  2. Symptom Control:        Pain: Continue Dilaudid 1 mg IV every 6 hrs prn                 Restart   Morphine ER 30 mg po BID                                    Dilaudid 2 mg po every 3 hrs prn  3. Psycho/Social:  Emotional support to patient and her mother.  Mother is confused at times and has limited insight into her daughter's situation.  Complicated psycho-social situation.       I verbalized  my concern for a successful discharge to their home and encouraged them to utilize       services that will be put into place for discharge.  Again patient refused my offer to contact her sister.   4. Spiritual  Chalplain involved   5. Disposition:  Home with  hospice services on discharge.  I believe both pateint and family would benefit greatly from hospice services.  Will write for choice   Time In Time Out Total Time Spent with Patient Total Overall Time  1100 1125 25 min 25 min    Greater than 50%  of this time was spent counseling and coordinating care related to the above assessment and plan.  Wadie Lessen NP  Palliative Medicine Team Team Phone # [phone] Pager [phone]  Discussed with Nursing and CMRN 1

## 2014-06-05 NOTE — Progress Notes (Signed)
[facility] notified by [facility] of request for [facility]) services after discharge.  Writer spoke with pt and mother at bedside, both voice agreement with [facility] services however both very guarded during discussion about hospice philosophy of comfort approach to care and question reason pt has to sign consents which agree to [facility] services, with attempts to explain hospice benefit pt becomes frustrated and indicated she wants the hospice nurse to come to talk with her once she gets home. Writer was clear that pt was not admitted to hospice services until [place] visited the home and pt signed consents indicating she wanted [facility] to help coordinate her care related to her cancer diagnosis. Ms Attig acknowledged hearing what this Probation officer said but it is not clear how much she understood. - Discussed with Ms Mckamie how she would travel home - pt was observed ambulating to the bathroom from the bed, steady on her feet; pt's mother stated there are a couple steps to get into the trailer but thought they could manage; they will need transport Database administrator did make CMRN Alinda Sierras aware.  -[name] also informed the mobile home they live in while they had food; when they left it was very cluttered/hard to get around- she doesn't like anyone touching her stuff or helping her with anything personal (like bathing). - Ms Nissley told her mother 'stop talking about that"  Pt requested Dr [name] as attending physician working with [facility]; Probation officer spoke with Dr [name], who voiced agreement with hospice services and prognosis of < 6 months at this time, however he usually does not serve as attending physician; if needed, pt may certainly f/u with Dr [name] (for example f/u appt) Writer discussed with pt, who voiced awareness she could still see Dr [name] if necessary but that the last RT was tomorrow, an she would need to have another doctor as her attending working with [facility] once home.   Pt shared she is not currently driving and it is more difficult to get to a physician's office; she voiced concern about managing her pain control- Probation officer discussed "call hospice FIRST not 911" and letting the [facility] nurse come out and see her - Ms Layfield stated she would rather do that, but when writer attempted to further discuss code status Ms Taffe started to cry saying "I don't want to talk about death"   - Initially Ms Coopersmith agreed to having [facility] contact Alvester Chou NP with [facility] to request she follow as attending as this practitioner makes visits to the home, however pt then stated "I am so sleepy I can't think anymore" and requested this RN follow up tomorrow. -Throughout the visit, the pt's mother was interrupting, talking about her own experiences, her own health issues, the fact that she has lived with the pt for the last 2 years and the pt wants her mother with her 24/7 and that while she "has a daughter that works at [employer] does not want her sister involved in her business"  -When asked what pharmacy [place] uses both Beatrix and her mother replied [facility] - when Probation officer asked why this pharmacy as it was not close to December's home her mother replied -we always use this one and Nevin has a friend Ronalee Belts who will take her prescriptions and get them filled for them Both Yaremi and her mother voiced awareness of plan for possible discharge after final Radiation treatment tomorrow.  Above information shared with [facility]; this writer will follow up in the morning   Danton Sewer, RN MSN [facility] Liaison

## 2014-06-06 ENCOUNTER — Ambulatory Visit
Admit: 2014-06-06 | Discharge: 2014-06-06 | Disposition: A | Discharge status: Home / Self Care | Payer: Medicaid Other | Attending: Radiation Oncology | Admitting: Radiation Oncology

## 2014-06-06 LAB — COMPREHENSIVE METABOLIC PANEL
ALBUMIN: 2.9 g/dL — AB (ref 3.5–5.2)
ALK PHOS: 51 U/L (ref 39–117)
ALT: 10 U/L (ref 0–35)
ANION GAP: 8 (ref 5–15)
AST: 16 U/L (ref 0–37)
BUN: 9 mg/dL (ref 6–23)
CO2: 28 mmol/L (ref 19–32)
Calcium: 8.6 mg/dL (ref 8.4–10.5)
Chloride: 100 mmol/L (ref 96–112)
Creatinine, Ser: 0.75 mg/dL (ref 0.50–1.10)
GFR calc Af Amer: >90 mL/min (ref >90)
GLUCOSE: 96 mg/dL (ref 70–99)
Potassium: 3.5 mmol/L (ref 3.5–5.1)
Sodium: 136 mmol/L (ref 135–145)
Total Bilirubin: 0.5 mg/dL (ref 0.3–1.2)
Total Protein: 7 g/dL (ref 6.0–8.3)

## 2014-06-06 LAB — CBC
HEMATOCRIT: 28.5 % — AB (ref 36.0–46.0)
Hemoglobin: 9.3 g/dL — ABNORMAL LOW (ref 12.0–15.0)
MCH: 28.4 pg (ref 26.0–34.0)
MCHC: 32.6 g/dL (ref 30.0–36.0)
MCV: 87.2 fL (ref 78.0–100.0)
Platelets: 187 10*3/uL (ref 150–400)
RBC: 3.27 MIL/uL — AB (ref 3.87–5.11)
RDW: 13.7 % (ref 11.5–15.5)
WBC: 7.5 10*3/uL (ref 4.0–10.5)

## 2014-06-06 MED ORDER — LEVOTHYROXINE SODIUM 125 MCG PO TABS: 125.0000 ug | ORAL_TABLET | Freq: Every day | ORAL | Status: AC

## 2014-06-06 MED ORDER — VITAMIN D (ERGOCALCIFEROL) 1.25 MG (50000 UNIT) PO CAPS: 50000.0000 [IU] | ORAL_CAPSULE | ORAL | Status: AC

## 2014-06-06 MED ORDER — ARIPIPRAZOLE 5 MG PO TABS: 5.0000 mg | ORAL_TABLET | Freq: Two times a day (BID) | ORAL | Status: DC

## 2014-06-06 MED ORDER — DOCUSATE SODIUM 100 MG PO CAPS: 100.0000 mg | ORAL_CAPSULE | Freq: Two times a day (BID) | ORAL | Status: AC

## 2014-06-06 MED ORDER — HYDROMORPHONE HCL 4 MG PO TABS: 4.0000 mg | ORAL_TABLET | ORAL | Status: AC | PRN

## 2014-06-06 MED ORDER — HYDROMORPHONE HCL 1 MG/ML IJ SOLN
1.0000 mg | INTRAMUSCULAR | Status: AC
  Administered 2014-06-06: 1 mg via INTRAVENOUS

## 2014-06-06 MED ORDER — LORAZEPAM 1 MG PO TABS: 1.0000 mg | ORAL_TABLET | Freq: Two times a day (BID) | ORAL | Status: AC

## 2014-06-06 MED ORDER — DEXAMETHASONE 4 MG PO TABS: 4.0000 mg | ORAL_TABLET | Freq: Every day | ORAL | Status: AC

## 2014-06-06 NOTE — Plan of Care (Signed)
Problem: Phase III Progression Outcomes Goal: Pain controlled on oral analgesia Outcome: Progressing Dilaudid 2mg  PO q3h prn effective thus far

## 2014-06-06 NOTE — Progress Notes (Signed)
Follow-up - pt seen at bedside, mother present in room, pt just coming out of the bathroom requested peri-pads for personal care. PMT NP Wadie Lessen also present at time of visit. Pt aware last Radiation tx today, discussed plan for hospice RN to see pt at her home later this afternoon and transport to home to be arranged by [facility]. Pt informed [facility] RN could see pt today at her home approximately 2:30-3 pm to go over paperwork/consent forms.  -pt started asking what further treatments would be done and how she was going to get her pain medication once home; discussed hospice following and prescription for oral pain medication to be given at discharge- pt became upset stating she was concerned about her pain control asking to have IV pain medication which the PMT NP stated she may be able to have an additional IV dose this morning; pt aware that the IV pain medication would not be continued at home at this time and the hospice team would be working with her to help with her symptom management - PMT NP and this Probation officer discussed pt's home medications with attending physician Dr Sanjuana Letters. Discussed again with pt attending physician/NP working with [facility] once home and pt agreeable to [facility] contacting NP Alvester Chou with [facility]  [facility] aware of above. Danton Sewer, RN MSN [facility] Liaison

## 2014-06-06 NOTE — Progress Notes (Signed)
Pt for discharge home with pt mother today following completion of radiation treatment today.   RNCM contacted [facility] and made referral.   Per [facility] RN liaison, a [facility] RN could see pt today at home approximately 2:30-3 pm to go over paperwork/consent forms.   CSW and RNCM met with pt this morning to explore options for transportation. Pt and pt mother attempted to contact pt friend, Ronalee Belts to provide transportation, but per pt and pt mother report, pt friend, Ronalee Belts is working until 6:30 pm today. CSW inquired with pt and pt mother about pt sister assisting and pt and pt mother state that she is not an option to provide assistance.   CSW spoke with Nathaniel Man, Clinical Social Work Multimedia programmer and [employer], [place] [facility] Work Chief Financial Officer and received approval for [employer].  CSW with assistance of RN, Laural Benes contacted [employer] (APS) and made referral to APS given concerns surrounding pt and pt mother abilities to care for themselves and each other, need for assessment of home environment, and concerns about pt compliance with pain medication. Per APS, they will review the referral to determine if it meets their criteria for a home assessment and if so, home assessment will be done.   CSW notified [facility] RN liaison, Danton Sewer that [facility] had made referral to APS.  RN provided cab voucer and called cab for pt and pt mother transportation home.   No further social work needs identified at this time.  CSW signing off.   Alison Murray, MSW, [place] Work [phone]

## 2014-06-06 NOTE — Progress Notes (Signed)
Pt discharged home with mother. Discharge instructions and scripts provided. Pt verbalized understanding.  Cab voucher provided

## 2014-06-06 NOTE — Discharge Summary (Signed)
[name], is a 59 y.o. female  DOB Feb 04, 1956  MRN [id number].  Admission date:  05/22/2014  Admitting Physician  [name], MD  Discharge Date:  06/06/2014   Primary MD  [name], FNP  Recommendations for primary care physician for things to follow:  Follow hospice recommendations   Admission Diagnosis   Pelvic mass [R19.00]   Discharge Diagnosis  Pelvic mass [R19.00]   Active Problems:   Endometrial ca   Cancer associated pain   Pelvic mass   Adjustment disorder with mixed anxiety and depressed mood   Essential hypertension   Extrinsic asthma   Anxiety state   DNR (do not resuscitate) discussion   Palliative care encounter      Hospital Course Please refer to comprehensive offservice note by Dr Coralyn Pear on 06/04/14, which I have imported below for [name]'s prolonged hospital stay details. In summary , [name] is a pleasant 59 year old female with metastatic endometrial CA undergoing palliative radiation- completes last session today. She is apparently in denial regarding her diagnosis and prognosis. She remains very tearful and her mother still hopes there is something that can be done to meaningfully change the trajectory of her disease. Unfortunately, I do not have anything new to offer, other than what patient has been presented with since admission. I have discussed with palliative care/hospice. Will continue with discharge plan for today once she has completed radiation treatments. HPI on 05/22/2014 by Dr. [name] Patient with complex medical hx. extensive history of noncompliance and drug seeking behavior and polysubstance abuse. 2013 patient was found to have early stage endometrial cancer. She underwent robotic assisted hysterectomy BSO by Dr [name] on  10-2011, with pathology demonstrating superficially invasive endometroid carcinoma arising in endometrial polyp. Patient did not follow up with gyn oncology.  In 2015 she presented with vaginal bleeding, pelvic pain and discharge. 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  CT at that time showed new lobulated soft tissue mass involving vaginal cuff and posterior wall of the urinary bladder, with areas of internal necrosis, measuring 3.7 x 6.9 cm; there was also new pelvic adenopathy at iliac bifurcations bilaterally, but no obvious disease outside of pelvis, including nothing involving bowels, and no hydronephrosis. Patient had an admission for diverticulitis in October 2015 and ended time was able to be seen in-house by radiation oncology and oncology which point palliative radiation therapy has been started. Patient has been going to her treatments intermittently. She has completed only about 1.5 [name] has 6 treatments. Radiation oncologist started her on Dilaudid 4 mg as needed for pain. She is supposed to follow-up with pain clinic but canceled her appointment. Patient continued to have worsening abdominal pain but started to have stabbing pain in her vaginal and rectal area. She have had frequent urination in seen pus from her rectal area. She has been intermittently tearful. Reports low-grade fevers. Today she was seen again by radiation oncology. Who has discussed  potential hospice or palliative care evaluation. Patient was recommended to present to emergency department so that the father workup could be done. Patient currently resides with her 78 year old mother. Scan today showed progression of her tumor burden 7 .4 cm necrotic pelvic mass arising from the vaginal cuff, Increased. 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 Suspected involvement of the bladder. Moderate right  hydroureteronephrosis, likely reflecting extrinsic compression by tumor. Unfortunately patient and the family have very poor insight. Patient has been verbally abusive to the staff and her family. At this point she is concentrated on pain management. Neither patient nor her family at this point is ready to have a conversation about her prognosis stating that "she'll be just fine". Unable to finish interview due to patient becoming agitated and screaming out. 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 UA showed evidence of UTI/ pateint with elevated WBC. Transiently tachycardic. NO nausea or vomiting. She refuses trial of PO pain medications and threatens to leave AMA.   Interim history 3/2-3/8 -Patient over the course of the week having difficulties accepting her diagnosis and prognosis. I attempted to discuss CODE STATUS with her on several occasions, with her becoming upset and tearful, stating "anything can be cured." She remains a full code. Palliative care has been involved as well, recommending PICC line placement which was supposed to happen last Friday, however, at the time she refused to sign consent. Today is is amendable to PICC line placement. She had been receiving her daily Radiation treatments, as today represents day 8 out of 10. The plan is for her to go home on Thurs after her last dose of radiation treatment. Palliative care is setting up home hospice which will manage her IV pain meds.   Assessment & Plan   Sepsis secondary to UTI -remains stable  Metastatic Endometrial cancer with hydronephrosis/ Cancer associated pain -Case was discussed with palliative care today regarding transition to home hospice. Pain symptoms have been difficult to control and may need IV narcotic analgesics at home for which PICC line has been ordered.  -Patient refusing to sign consent for PICC line on 05/29/2014, I discussed  this further with Palliative Care. Will discuss goals of care further today, including topic of transitioning to hospice care.  -Plan for PICC line placement today, order placed -Palliative Care following   Essential hypertension -Currently stable, on no home medications -Will continue to monitor  Hypothyroidism -TSH 22.742 (wonder if patient was taking her synthroid) -Continue Synthroid- increased dose to 111mcg daily  Anxiety/Depression/Psychosis -Continue Ativan, sertraline, thiothixene -Consulted psychiatry for medication assistance- consultation recommendations pending  Asthma -Currently stable, continue albuterol PRN  Hypokalemia -A.m. showing a potassium of 2.9, will replace with 4 runs of IV potassium chloride. -Repeat lab work in a.m.   Goals of care  On 05/31/2014 held a 30 minute discussion spent with patient and her mother who was present at bedside discussing goals of care. 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 I explained that this intervention was not curative and was being done for control of symptoms. We discussed transition to hospice, having a PICC line placed for administration of IV narcotics at home. We also discussed CODE STATUS as she remains a full code. I asked if  she would want undergo cardiopulmonary resuscitation despite the likelihood of this intervention not changing outcomes and likely leading to significant pain at the end of life. Patient becoming upset with me not wanting to talk about the subjective. She explained to me that there was a cure for everything and hoped that the current radiation therapy would get her well again.    Code Status: Full Code  Family Communication: Mother at  bedside  Disposition Plan: Home with hospice today    Procedures  Palliative Radiation   Consults  Oncology Radiation oncology [facility], via phone Palliative care           Discharge Instructions  and  Discharge Medications  Discharge Instructions    Diet - low sodium heart healthy    Complete by:  As directed      Increase activity slowly    Complete by:  As directed             Medication List    STOP taking these medications        amoxicillin-clavulanate 875-125 MG per tablet  Commonly known as:  AUGMENTIN     cephALEXin 500 MG capsule  Commonly known as:  KEFLEX     nitrofurantoin (macrocrystal-monohydrate) 100 MG capsule  Commonly known as:  MACROBID     ondansetron 4 MG tablet  Commonly known as:  ZOFRAN     Oxycodone HCl 10 MG Tabs     thiothixene 2 MG capsule  Commonly known as:  NAVANE      TAKE these medications        acetaminophen 500 MG tablet  Commonly known as:  TYLENOL  Take 1,000 mg by mouth every 6 (six) hours as needed.     albuterol 108 (90 BASE) MCG/ACT inhaler  Commonly known as:  VENTOLIN HFA  Inhale 2 puffs into the lungs every 4 (four) hours as needed for wheezing or shortness of breath.     ARIPiprazole 5 MG tablet  Commonly known as:  ABILIFY  Take 1 tablet (5 mg total) by mouth 2 (two) times daily.     b complex vitamins capsule  Take 1 capsule by mouth daily.     dexamethasone 4 MG tablet  Commonly known as:  DECADRON  Take 1 tablet (4 mg total) by mouth daily.     docusate sodium 100 MG capsule  Commonly known as:  COLACE  Take 1 capsule (100 mg total) by mouth 2 (two) times daily.     HYDROmorphone 4 MG tablet  Commonly known as:  DILAUDID  Take 1 tablet (4 mg total) by mouth every 4 (four) hours as needed for severe pain.     ibuprofen 200 MG tablet  Commonly known as:  ADVIL,MOTRIN  Take 400 mg by mouth every 6 (six) hours as needed for moderate pain.     levothyroxine 125 MCG  tablet  Commonly known as:  SYNTHROID, LEVOTHROID  Take 1 tablet (125 mcg total) by mouth daily before breakfast.     LORazepam 1 MG tablet  Commonly known as:  ATIVAN  Take 1 tablet (1 mg total) by mouth 2 (two) times daily.     MSM 1000 MG Tabs  Take 1 tablet by mouth daily.     multivitamin tablet  Take 1 tablet by mouth daily.     sertraline 100 MG tablet  Commonly known as:  ZOLOFT  Take 200 mg by mouth every evening.     Vitamin D (  Ergocalciferol) 50000 UNITS Caps capsule  Commonly known as:  DRISDOL  Take 1 capsule (50,000 Units total) by mouth every 7 (seven) days. No specific days        Diet and Activity recommendation: See Discharge Instructions above  Major procedures and Radiology Reports - PLEASE review detailed and final reports for all details, in brief -    Ct Abdomen Pelvis W Contrast  05/22/2014   CLINICAL DATA:  Endometrial cancer, nausea, pelvic drainage  EXAM: CT ABDOMEN AND PELVIS WITH CONTRAST  TECHNIQUE: Multidetector CT imaging of the abdomen and pelvis was performed using the standard protocol following bolus administration of intravenous contrast.  CONTRAST:  192mL OMNIPAQUE IOHEXOL 300 MG/ML SOLN, 48mL OMNIPAQUE IOHEXOL 300 MG/ML SOLN  COMPARISON:  03/06/2014  FINDINGS: Lower chest:  Lung bases are clear.  Hepatobiliary: Liver is within normal limits.  Gallbladder is unremarkable. No intrahepatic or extrahepatic ductal dilatation.  Pancreas: Within normal limits.  Spleen: Splenomegaly, measuring 15.5 cm in maximal dimension.  Adrenals/Urinary Tract: Adrenal glands are within normal limits.  Left kidney is within normal limits.  Malrotated right kidney. Moderate hydroureteronephrosis, likely secondary to extrinsic compression.  Thick-walled bladder.  Stomach/Bowel: Stomach is within normal limits.  No evidence of bowel obstruction.  Appendix is not discretely visualized and may be surgically absent.  Moderate colonic stool burden, suggesting constipation.   Sigmoid colon is thick-walled and likely involved via the pelvic mass (described below).  Mass like thickening involving the rectum (series 2/image 57), new.  Vascular/Lymphatic: No evidence of abdominal aortic aneurysm.  Small subcentimeter retroperitoneal nodes, indeterminate.  Scattered nodes along the sigmoid mesocolon measuring up to 10 mm (series 2/image 52), along with bilateral internal iliac nodes measuring 19-20 mm (series 2/image 57), compatible with nodal metastases.  Reproductive: Status Mcdaniel hysterectomy.  7.4 x 5.7 x 6.1 cm necrotic pelvic mass (series 2/image 62), likely arising from the vaginal cuff, previously 3.2 x 5.6 cm. Gas within the lesion suggest direct communication with the sigmoid colon/ rectum. Additionally, the mass abuts the posterior wall of the bladder, which is also likely involved.  Other: No abdominopelvic ascites.  Musculoskeletal: Degenerative changes of the thoracolumbar spine, most prominent at L5-S1.  IMPRESSION: 7.4 cm necrotic pelvic mass arising from the vaginal cuff, increased.  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 Suspected involvement of the bladder.  Associated pelvic nodal metastases.  Moderate right hydroureteronephrosis, likely reflecting extrinsic compression by tumor.   Electronically Signed   By: Julian Hy M.D.   On: 05/22/2014 21:59    Micro Results   Recent Results (from the past 240 hour(s))  Clostridium Difficile by PCR     Status: None   Collection Time: 05/28/14 10:11 PM  Result Value Ref Range Status   C difficile by pcr NEGATIVE NEGATIVE Final       Today   Subjective:   [name] today has no headache,no chest pain but some abdominal pain,no new weakness tingling or numbness, feels much better wants to go home today.   Objective:   Blood pressure 112/68, pulse 78, temperature 98.7 F (37.1 C), temperature source Oral, resp. rate 16, height 4\' 10"  (1.473 m), weight 75.297 kg (166  lb), SpO2 97 %.   Intake/Output Summary (Last 24 hours) at 06/06/14 0947 Last data filed at 06/06/14 0924  Gross per 24 hour  Intake    120 ml  Output      0 ml  Net    120 ml    Exam Awake  Alert, Oriented x 3, No new F.N deficits, Normal affect [place].AT,PERRAL Supple Neck,No JVD, No cervical lymphadenopathy appriciated.  Symmetrical Chest wall movement, Good air movement bilaterally, CTAB RRR,No Gallops,Rubs or new Murmurs, No Parasternal Heave +ve B.Sounds, Abd Soft, Non tender, No organomegaly appriciated, No rebound -guarding or rigidity. No Cyanosis, Clubbing or edema, No new Rash or bruise  Data Review   CBC w Diff: Lab Results  Component Value Date   WBC 7.5 06/06/2014   WBC 5.0 02/18/2014   HGB 9.3* 06/06/2014   HGB 12.9 02/18/2014   HCT 28.5* 06/06/2014   HCT 38.8 02/18/2014   PLT 187 06/06/2014   PLT 163 02/18/2014   LYMPHOPCT 10* 05/22/2014   LYMPHOPCT 29.7 02/18/2014   MONOPCT 9 05/22/2014   MONOPCT 10.7 02/18/2014   EOSPCT 4 05/22/2014   EOSPCT 2.9 02/18/2014   BASOPCT 0 05/22/2014   BASOPCT 0.4 02/18/2014    CMP: Lab Results  Component Value Date   NA 141 06/02/2014   NA 139 02/18/2014   K 3.5 06/02/2014   K 4.2 02/18/2014   CL 106 06/02/2014   CO2 26 06/02/2014   CO2 27 02/18/2014   BUN 10 06/02/2014   BUN 7.8 02/18/2014   CREATININE 0.74 06/02/2014   CREATININE 0.7 02/18/2014   PROT 6.7 05/23/2014   ALBUMIN 2.9* 05/23/2014   BILITOT 0.6 05/23/2014   ALKPHOS 57 05/23/2014   AST 20 05/23/2014   ALT 13 05/23/2014  .   Total Time in preparing paper work, data evaluation and todays exam - 35 minutes  Bethann Qualley M.D on 06/06/2014 at 9:47 AM  [facility] Office  [phone]

## 2014-06-10 ENCOUNTER — Telehealth: Payer: Self-pay | Admitting: Oncology

## 2014-06-10 NOTE — Telephone Encounter (Signed)
[name] called and said that hospice has been doing a good job managing her pain.  She also mentioned that she has started taking dexamethasone twice a day for the pain involving her fistula.  She said this has really helped.  She is wondering if Dr. [name] has any further plans for her treatment.  She is concerned that "her doctors have given up hope with her treatment."  Advised her that Dr. [name] is out of the office this week and that we will contact her next week when he returns.  Kemisha verbalized agreement.

## 2014-06-17 ENCOUNTER — Telehealth: Payer: Self-pay | Admitting: Oncology

## 2014-06-17 NOTE — Telephone Encounter (Signed)
Pranika called and was wondering if Dr. [name] is planning to do any more radiation.  She reports that she is doing a little better and is keeping on top of her medications.  She is taking dilaudid every 4 hours.  She reports her appetite has improved.  She reports occasional incontinence of stool from her fistula.

## 2014-06-19 ENCOUNTER — Telehealth: Payer: Self-pay | Admitting: Oncology

## 2014-06-19 NOTE — Telephone Encounter (Signed)
Notified Zoe of follow up on 07/15/14 at 4:30 pm.

## 2014-06-19 NOTE — Telephone Encounter (Signed)
Called [name] and advised her that Dr. [name] would like to see her in a month for follow up.  [name] was concerned that her cancer would grow in that time but is willing to make a one month appointment.  Transferred her to Levada Dy, Estate manager/land agent to make the appointment.

## 2014-06-29 ENCOUNTER — Encounter: Payer: Self-pay | Admitting: Radiation Oncology

## 2014-06-29 NOTE — Progress Notes (Signed)
  Radiation Oncology         [phone] ________________________________  Name: KEORA ECCLESTON MRN: [id number]  Date: 06/29/2014  DOB: 1955-12-18  End of Treatment Note  Diagnosis:   Recurrent endometrial cancer     Indication for treatment:  Local regional control       Radiation treatment dates:   November 4 through March 10  Site/dose:   Pelvis 46.2 Gray in 19 fractions  Beams/energy:   3-D conformal, 15 x beams  Narrative: The patient tolerated radiation treatment relatively well.   The patient initially began her treatments in early November with curative intent. Due to the patient's social situation she was inconsistent with her treatment. She only completed 9 treatments. Patient had a later date was admitted to the hospital and imaging showed significant progression of her disease in the pelvis with probable fistula formation. Patient then proceeded with palliative radiation therapy while an inpatient. She did seem to have some improvement in her pain at the completion of her palliative radiation therapy  Plan: The patient has completed radiation treatment. The patient will return to radiation oncology clinic for routine followup in one month. I advised them to call or return sooner if they have any questions or concerns related to their recovery or treatment.  -----------------------------------  Blair Promise, PhD, MD

## 2014-07-15 ENCOUNTER — Ambulatory Visit
Admission: RE | Admit: 2014-07-15 | Discharge: 2014-07-15 | Disposition: A | Discharge status: Home / Self Care | Payer: Medicaid Other | Source: Ambulatory Visit | Attending: Radiation Oncology | Admitting: Radiation Oncology

## 2014-07-15 ENCOUNTER — Encounter: Payer: Self-pay | Admitting: Radiation Oncology

## 2014-07-15 VITALS — BP 148/82 | HR 73 | Temp 98.7°F | Resp 16 | Ht <= 58 in | Wt 156.8 lb

## 2014-07-15 DIAGNOSIS — C541 Malignant neoplasm of endometrium: Secondary | ICD-10-CM

## 2014-07-15 NOTE — Progress Notes (Signed)
[name] here for follow up.  She denies pain.  She has been taking dilaudid 4 mg q 4 hours and 2 advil at night.  She reports a good appetite.  Her weight is down 10 lbs since February.  She is taking decadron 4 mg bid.  She reports being more active.  She continues to have hospice.  She reports urinary frequency.  She denies dysuria but report her vaginal area is sore.  She reports having a small amount of pink vaginal bleeding.  She reports having this for a month.  She reports trouble sleeping.  She reports having regular bowel movements.  She is taking colace twice a day.    BP 148/82 mmHg  Pulse 73  Temp(Src) 98.7 F (37.1 C) (Oral)  Resp 16  Ht 4\' 10"  (1.473 m)  Wt 156 lb 12.8 oz (71.124 kg)  BMI 32.78 kg/m2  SpO2 100%

## 2014-07-15 NOTE — Progress Notes (Signed)
Radiation Oncology         [phone] ________________________________  Name: [name] MRN: [id number]  Date: 07/15/2014  DOB: 04/21/1955  Follow-Up Visit Note  CC: [name], FNP  Nancy Marus, MD    ICD-9-CM ICD-10-CM   1. Endometrial ca 182.0 C54.1     Diagnosis:   Recurrent endometrial cancer  Interval Since Last Radiation:  5  weeks  Narrative:  The patient returns today for routine follow-up.  Clinically she seems to be doing much better at this time. She takes Dilaudid 4 mg every 4 hours.  She denies any pain if she continues on this regimen. She also is taking Decadron. Her energy level is improving. She is able to do some things around the house. She denies any nausea or significant bowel cramping. She denies any significant drainage from the vaginal vault in particular any stool passing through the vaginal vault. She occasionally will have some blood-tinged vaginal drainage. She denies any hematuria or rectal bleeding. Her bowel habits are back to normal. Patient does have hospice coming to the house approximately once a week.                              ALLERGIES:  is allergic to sulfa antibiotics and sulfamethoxazole-trimethoprim.  Meds: Current Outpatient Prescriptions  Medication Sig Dispense Refill  . b complex vitamins capsule Take 1 capsule by mouth daily.    Marland Kitchen dexamethasone (DECADRON) 4 MG tablet Take 1 tablet (4 mg total) by mouth daily. 30 tablet 1  . docusate sodium (COLACE) 100 MG capsule Take 1 capsule (100 mg total) by mouth 2 (two) times daily. 10 capsule 0  . HYDROmorphone (DILAUDID) 4 MG tablet Take 1 tablet (4 mg total) by mouth every 4 (four) hours as needed for severe pain. 100 tablet 0  . ibuprofen (ADVIL,MOTRIN) 200 MG tablet Take 400 mg by mouth every 6 (six) hours as needed for moderate pain.    Marland Kitchen levothyroxine (SYNTHROID, LEVOTHROID) 125 MCG tablet Take 1 tablet (125 mcg total) by mouth daily before breakfast. 30 tablet 1  .  LORazepam (ATIVAN) 1 MG tablet Take 1 tablet (1 mg total) by mouth 2 (two) times daily. 30 tablet 0  . Methylsulfonylmethane (MSM) 1000 MG TABS Take 1 tablet by mouth daily.    Marland Kitchen OVER THE COUNTER MEDICATION Omega X L. Takes 2 tablets per day.    . sertraline (ZOLOFT) 100 MG tablet Take 200 mg by mouth every evening.     Marland Kitchen acetaminophen (TYLENOL) 500 MG tablet Take 1,000 mg by mouth every 6 (six) hours as needed.    Marland Kitchen albuterol (VENTOLIN HFA) 108 (90 BASE) MCG/ACT inhaler Inhale 2 puffs into the lungs every 4 (four) hours as needed for wheezing or shortness of breath. (Patient not taking: Reported on 03/25/2014) 1 Inhaler 0  . ARIPiprazole (ABILIFY) 5 MG tablet Take 1 tablet (5 mg total) by mouth 2 (two) times daily. (Patient not taking: Reported on 07/15/2014) 30 tablet 1  . Multiple Vitamins-Minerals (MULTIVITAMIN) tablet Take 1 tablet by mouth daily. (Patient not taking: Reported on 03/04/2014) 30 tablet 0  . thiothixene (NAVANE) 2 MG capsule Take 2 mg by mouth at bedtime.  0  . Vitamin D, Ergocalciferol, (DRISDOL) 50000 UNITS CAPS capsule Take 1 capsule (50,000 Units total) by mouth every 7 (seven) days. No specific days (Patient not taking: Reported on 07/15/2014) 30 capsule 1   No current facility-administered medications  for this encounter.    Physical Findings: The patient is in no acute distress. Patient is alert and oriented.  height is 4\' 10"  (1.473 m) and weight is 156 lb 12.8 oz (71.124 kg). Her oral temperature is 98.7 F (37.1 C). Her blood pressure is 148/82 and her pulse is 73. Her respiration is 16 and oxygen saturation is 100%. .  She is in much better spirits and looks well rested. Examination of the neck and supraclavicular region reveals no evidence of adenopathy. The axillary areas are free of adenopathy. Examination of the lungs reveals them to be clear. The heart has a regular rhythm and rate. Abdomen is soft and nontender with normal bowel sounds. I discussed consideration for  pelvic exam.   at this point the patient does not wish to proceed with this procedure as part of her follow-up.  Lab Findings: Lab Results  Component Value Date   WBC 7.5 06/06/2014   HGB 9.3* 06/06/2014   HCT 28.5* 06/06/2014   MCV 87.2 06/06/2014   PLT 187 06/06/2014    Radiographic Findings: No results found.  Impression:  Clinically she is doing much better at this time. She denies any stool passage through the vaginal vault suggesting that her fistula has closed. Patient was inquiring about proceeding with intracavitary brachy therapy treatments. I discussed with her that since she is on hospice we would only consider this if she started having vaginal bleeding or more symptoms. Patient would like to also have a repeat CT scan performed to assess her response to her palliative radiation therapy. I discussed that we will need to have hospice approval and inquire about this issue tomorrow.  Plan:  Routine follow-up in 2-3 months.  ____________________________________ Blair Promise, MD

## 2014-07-16 ENCOUNTER — Telehealth: Payer: Self-pay | Admitting: Oncology

## 2014-07-16 NOTE — Telephone Encounter (Signed)
Called [facility] and spoke to [place], Chief Strategy Officer.  Asked if [name] would be able to have a CT Scan of her abdomen/pelvis to assess her response to her palliative radiation therapy.  Abigail Butts replied that she thinks it will be covered but she wants to double check with their physician and will call us back.

## 2014-07-22 ENCOUNTER — Telehealth: Payer: Self-pay | Admitting: Oncology

## 2014-07-22 NOTE — Telephone Encounter (Signed)
Craig called and was wondering if a CT scan will be done.  She also reported that she is urinating more frequently and having episodes of incontinence of urine.  She denies dysuria.  She is wondering if this is being caused by her fistula.  Advised her that Dr. [name] will be notified and we will call her after 1 pm tomorrow.

## 2014-07-23 ENCOUNTER — Telehealth: Payer: Self-pay | Admitting: Oncology

## 2014-07-23 NOTE — Telephone Encounter (Signed)
Renee from [facility] returned my call and said she will be checking with her case manager, Abigail Butts, and Dr. Lyman Speller to see if Yazhini can have a CT scan while under hospice.   She will call back with their answers.

## 2014-07-23 NOTE — Telephone Encounter (Signed)
[name] [phone]) with [facility] called and said that Dr. Lyman Speller will be calling Dr. [name] tomorrow afternoon to discuss the CT Scan. Advised her to have him call [phone].

## 2014-07-23 NOTE — Telephone Encounter (Signed)
Called and left a message for Joseph Art, RN at [facility] regarding ordering a CT scan for Michaelle.

## 2014-07-25 ENCOUNTER — Other Ambulatory Visit: Payer: Self-pay | Admitting: Radiation Oncology

## 2014-07-25 DIAGNOSIS — C541 Malignant neoplasm of endometrium: Secondary | ICD-10-CM

## 2014-07-29 ENCOUNTER — Telehealth: Payer: Self-pay | Admitting: Oncology

## 2014-07-29 NOTE — Telephone Encounter (Signed)
[name] called asking when her CT scan is going to be scheduled.  Advised her that we are waiting for her primary care doctor to call us back with an NPI number.  [name] verbalized agreement.  She mentioned that she noticed brown liquid stool yesterday from her fistula.  She is upset because she thought the fistula had closed.  She continues to take decadron for this issue.  She will be seen by her hospice nurse tomorrow.  Tai Syfert that we will call her as soon as her scan is scheduled.

## 2014-07-31 ENCOUNTER — Telehealth: Payer: Self-pay | Admitting: *Deleted

## 2014-07-31 NOTE — Telephone Encounter (Signed)
CALLED PATIENT TO INFORM OF LAB, SCAN , AND FU, PATIENT HAS DECIDED NOT TO HAVE THESE TESTS, NOTIFIED [name] NURSE OF DR. Niobrara.  [name]

## 2014-08-05 ENCOUNTER — Ambulatory Visit (HOSPITAL_COMMUNITY): Payer: Medicaid Other

## 2014-08-05 ENCOUNTER — Ambulatory Visit: Payer: Medicaid Other

## 2014-08-07 ENCOUNTER — Ambulatory Visit (HOSPITAL_COMMUNITY): Payer: Medicaid Other

## 2014-08-08 ENCOUNTER — Telehealth: Payer: Self-pay | Admitting: Oncology

## 2014-08-08 ENCOUNTER — Ambulatory Visit: Payer: Self-pay | Admitting: Radiation Oncology

## 2014-08-08 ENCOUNTER — Inpatient Hospital Stay: Admission: RE | Admit: 2014-08-08 | Payer: Self-pay | Source: Ambulatory Visit | Admitting: Radiation Oncology

## 2014-08-08 NOTE — Telephone Encounter (Signed)
[name] called and said her pain has increased.  She said her fistula "has been acting up."  She is scared that her cancer is getting bigger.  She said she is too scared to get a CT scan.  She is wondering if any more radiation can be done or if she needs chemotherapy.  Advised her to think about getting the CT scan so she will know for sure how her cancer is doing.  Advised her to call back in a few days and let us know.

## 2014-08-19 ENCOUNTER — Telehealth: Payer: Self-pay | Admitting: Oncology

## 2014-08-19 NOTE — Telephone Encounter (Signed)
[name] called and said she is feeling tired and weak.  She said that her heart has been skipping beats.  She has been to a cardiologist in the past for this and was supposed to have a stress test but did not go through with it.  She is wondering if Dr. [name] can refer her to a cardiologist.  Advised her to call hospice and see if they would need to refer her.  [name] verbalized agreement.  She also said she would like to reschedule her CT scan but needs to talk to her friend about transportation.  She is going to call back after talking to her friend.

## 2014-09-03 ENCOUNTER — Telehealth: Payer: Self-pay | Admitting: Oncology

## 2014-09-03 NOTE — Telephone Encounter (Signed)
[name] called and said she isn't doing well.  She said she is really weak and "can't even get up to get coffee in the morning."  She is worried that her cancer is worse and that she is going to die.  She is also reporting that her heart is skipping beats.  Hospice is aware and is referring her to cardiology.  She reports her fistula is worse and she is bleeding small amounts from her rectal and vaginal areas.  She says that she would like to have surgery to repair the fistula.  She is wondering if Dr. [name] is planning on doing anymore treatments.  Advised her that Dr. [name] will be consulted and we will call her back.  [name] verbalized agreement.

## 2014-09-09 ENCOUNTER — Telehealth: Payer: Self-pay | Admitting: *Deleted

## 2014-09-09 ENCOUNTER — Telehealth: Payer: Self-pay | Admitting: Oncology

## 2014-09-09 NOTE — Telephone Encounter (Signed)
CALLED PATIENT TO INFORM OF CT FOR 09-11-14 @ 5 PM , SPOKE WITH PATIENT AND SHE IS AWARE OF THIS TEST

## 2014-09-09 NOTE — Telephone Encounter (Signed)
[name] called asking if Dr. [name] is planning to do brachytherapy.  Advised her that I had not heard from Dr. [name] yet.  [name] is concerned because she is feeling weak and having issues with her fistula.  She reports that she had stomach cramps and loose stools for 2 hours on Saturday.  She is interested in rescheduling the CT scan.  Advised her that Dr. [name] will be contacted tomorrow morning and we will call her back.  Transferred her to Romie Jumper, Medical Secretary to reschedule the CT Scan.

## 2014-09-10 ENCOUNTER — Telehealth: Payer: Self-pay | Admitting: Oncology

## 2014-09-10 NOTE — Telephone Encounter (Signed)
Called [name] and advised her that Dr. [name] needs to have the CT scan done before he can decide on the brachytherapy.  Chaniqua said the scan has been scheduled for 09/17/14 and she will drink the contrast dye before the scan.

## 2014-09-11 ENCOUNTER — Ambulatory Visit (HOSPITAL_COMMUNITY): Payer: Medicaid Other

## 2014-09-11 ENCOUNTER — Telehealth: Payer: Self-pay | Admitting: *Deleted

## 2014-09-11 ENCOUNTER — Ambulatory Visit (HOSPITAL_COMMUNITY): Admission: RE | Admit: 2014-09-11 | Payer: Medicaid Other | Source: Ambulatory Visit

## 2014-09-11 NOTE — Telephone Encounter (Signed)
Called patient to inform of labs for 09-17-14 @ 2:30 pm, lvm with mother, patient to call when she gets up from her nap.

## 2014-09-16 ENCOUNTER — Telehealth: Payer: Self-pay | Admitting: *Deleted

## 2014-09-16 NOTE — Telephone Encounter (Signed)
Called patient to inform of lab, and test has been cancelled due to her insurance declining to pay for test, lvm for a return call

## 2014-09-16 NOTE — Telephone Encounter (Signed)
CALLED PATIENT TO INFORM OF TEST AND LAB BEING CANCELLED DUE TO INSURANCE REFUSING TO PAY FOR IT, SPOKE WITH PATIENT AND SHE UNDERSTOOD THIS

## 2014-09-17 ENCOUNTER — Ambulatory Visit (HOSPITAL_COMMUNITY): Payer: Medicaid Other

## 2014-09-17 ENCOUNTER — Telehealth: Payer: Self-pay | Admitting: *Deleted

## 2014-09-17 ENCOUNTER — Ambulatory Visit: Payer: Medicaid Other

## 2014-09-17 NOTE — Telephone Encounter (Signed)
CALLED PATIENT TO GIVE INFO FROM DR. KINARD, LVM FOR A RETURN CALL

## 2014-09-18 ENCOUNTER — Telehealth: Payer: Self-pay | Admitting: Oncology

## 2014-09-18 NOTE — Telephone Encounter (Signed)
Allyssia called and said she is very weak.  She said she has been put on an antibiotic for a possible UTI.  She reports she is having incontinence due to her fistula.  She reports that "everything goes right through me."  She saw blood in her stools today.  She said she is upset that the CT was canceled due to her insurance coverage. She is asking if she will have any more treatment. Advised her that Dr. [name] will call her tomorrow after lunch.

## 2014-10-04 ENCOUNTER — Institutional Professional Consult (permissible substitution): Payer: Self-pay | Admitting: Radiation Oncology

## 2014-10-07 ENCOUNTER — Encounter: Payer: Self-pay | Admitting: Radiation Oncology

## 2014-10-10 ENCOUNTER — Ambulatory Visit
Admission: RE | Admit: 2014-10-10 | Discharge: 2014-10-10 | Disposition: A | Discharge status: Home / Self Care | Payer: Medicaid Other | Source: Ambulatory Visit | Attending: Radiation Oncology | Admitting: Radiation Oncology

## 2014-10-10 ENCOUNTER — Ambulatory Visit: Payer: Medicaid Other

## 2014-10-10 ENCOUNTER — Inpatient Hospital Stay: Admission: RE | Admit: 2014-10-10 | Payer: Medicaid Other | Source: Ambulatory Visit | Admitting: Radiation Oncology

## 2014-10-15 ENCOUNTER — Inpatient Hospital Stay (HOSPITAL_COMMUNITY)
Admission: EM | Admit: 2014-10-15 | Discharge: 2014-10-28 | DRG: 754 | Disposition: E | Attending: Internal Medicine | Admitting: Internal Medicine

## 2014-10-15 ENCOUNTER — Encounter (HOSPITAL_COMMUNITY): Payer: Self-pay | Admitting: *Deleted

## 2014-10-15 DIAGNOSIS — R319 Hematuria, unspecified: Secondary | ICD-10-CM | POA: Diagnosis present

## 2014-10-15 DIAGNOSIS — M1990 Unspecified osteoarthritis, unspecified site: Secondary | ICD-10-CM | POA: Diagnosis present

## 2014-10-15 DIAGNOSIS — I10 Essential (primary) hypertension: Secondary | ICD-10-CM | POA: Diagnosis present

## 2014-10-15 DIAGNOSIS — Z515 Encounter for palliative care: Secondary | ICD-10-CM

## 2014-10-15 DIAGNOSIS — E039 Hypothyroidism, unspecified: Secondary | ICD-10-CM | POA: Diagnosis present

## 2014-10-15 DIAGNOSIS — Z66 Do not resuscitate: Secondary | ICD-10-CM | POA: Diagnosis present

## 2014-10-15 DIAGNOSIS — C772 Secondary and unspecified malignant neoplasm of intra-abdominal lymph nodes: Secondary | ICD-10-CM | POA: Diagnosis present

## 2014-10-15 DIAGNOSIS — R197 Diarrhea, unspecified: Secondary | ICD-10-CM | POA: Diagnosis present

## 2014-10-15 DIAGNOSIS — R531 Weakness: Secondary | ICD-10-CM | POA: Diagnosis not present

## 2014-10-15 DIAGNOSIS — J45909 Unspecified asthma, uncomplicated: Secondary | ICD-10-CM | POA: Diagnosis present

## 2014-10-15 DIAGNOSIS — Z923 Personal history of irradiation: Secondary | ICD-10-CM

## 2014-10-15 DIAGNOSIS — F32A Depression, unspecified: Secondary | ICD-10-CM | POA: Diagnosis present

## 2014-10-15 DIAGNOSIS — N179 Acute kidney failure, unspecified: Secondary | ICD-10-CM | POA: Diagnosis present

## 2014-10-15 DIAGNOSIS — G4730 Sleep apnea, unspecified: Secondary | ICD-10-CM | POA: Diagnosis present

## 2014-10-15 DIAGNOSIS — Z8249 Family history of ischemic heart disease and other diseases of the circulatory system: Secondary | ICD-10-CM

## 2014-10-15 DIAGNOSIS — N131 Hydronephrosis with ureteral stricture, not elsewhere classified: Secondary | ICD-10-CM | POA: Diagnosis present

## 2014-10-15 DIAGNOSIS — R109 Unspecified abdominal pain: Secondary | ICD-10-CM | POA: Diagnosis present

## 2014-10-15 DIAGNOSIS — R6521 Severe sepsis with septic shock: Secondary | ICD-10-CM | POA: Diagnosis present

## 2014-10-15 DIAGNOSIS — K625 Hemorrhage of anus and rectum: Secondary | ICD-10-CM | POA: Diagnosis present

## 2014-10-15 DIAGNOSIS — Z79899 Other long term (current) drug therapy: Secondary | ICD-10-CM

## 2014-10-15 DIAGNOSIS — F419 Anxiety disorder, unspecified: Secondary | ICD-10-CM | POA: Diagnosis present

## 2014-10-15 DIAGNOSIS — R627 Adult failure to thrive: Secondary | ICD-10-CM | POA: Diagnosis present

## 2014-10-15 DIAGNOSIS — C541 Malignant neoplasm of endometrium: Principal | ICD-10-CM | POA: Diagnosis present

## 2014-10-15 DIAGNOSIS — M797 Fibromyalgia: Secondary | ICD-10-CM | POA: Diagnosis present

## 2014-10-15 DIAGNOSIS — R945 Abnormal results of liver function studies: Secondary | ICD-10-CM

## 2014-10-15 DIAGNOSIS — R7989 Other specified abnormal findings of blood chemistry: Secondary | ICD-10-CM

## 2014-10-15 DIAGNOSIS — F4323 Adjustment disorder with mixed anxiety and depressed mood: Secondary | ICD-10-CM | POA: Diagnosis present

## 2014-10-15 DIAGNOSIS — Z8051 Family history of malignant neoplasm of kidney: Secondary | ICD-10-CM

## 2014-10-15 DIAGNOSIS — G8929 Other chronic pain: Secondary | ICD-10-CM | POA: Diagnosis present

## 2014-10-15 DIAGNOSIS — A419 Sepsis, unspecified organism: Secondary | ICD-10-CM | POA: Diagnosis present

## 2014-10-15 DIAGNOSIS — Z9114 Patient's other noncompliance with medication regimen: Secondary | ICD-10-CM | POA: Diagnosis present

## 2014-10-15 DIAGNOSIS — E860 Dehydration: Secondary | ICD-10-CM | POA: Diagnosis present

## 2014-10-15 DIAGNOSIS — F329 Major depressive disorder, single episode, unspecified: Secondary | ICD-10-CM | POA: Diagnosis present

## 2014-10-15 DIAGNOSIS — C787 Secondary malignant neoplasm of liver and intrahepatic bile duct: Secondary | ICD-10-CM | POA: Diagnosis present

## 2014-10-15 LAB — CBC WITH DIFFERENTIAL/PLATELET
Basophils Absolute: 0 10*3/uL (ref 0.0–0.1)
Basophils Relative: 0 % (ref 0–1)
EOS PCT: 1 % (ref 0–5)
Eosinophils Absolute: 0.1 10*3/uL (ref 0.0–0.7)
HCT: 30.7 % — ABNORMAL LOW (ref 36.0–46.0)
Hemoglobin: 10.1 g/dL — ABNORMAL LOW (ref 12.0–15.0)
LYMPHS ABS: 0.7 10*3/uL (ref 0.7–4.0)
LYMPHS PCT: 7 % — AB (ref 12–46)
MCH: 29 pg (ref 26.0–34.0)
MCHC: 32.9 g/dL (ref 30.0–36.0)
MCV: 88.2 fL (ref 78.0–100.0)
Monocytes Absolute: 1 10*3/uL (ref 0.1–1.0)
Monocytes Relative: 11 % (ref 3–12)
Neutro Abs: 7.4 10*3/uL (ref 1.7–7.7)
Neutrophils Relative %: 81 % — ABNORMAL HIGH (ref 43–77)
Platelets: 120 10*3/uL — ABNORMAL LOW (ref 150–400)
RBC: 3.48 MIL/uL — ABNORMAL LOW (ref 3.87–5.11)
RDW: 15.2 % (ref 11.5–15.5)
WBC: 9.2 10*3/uL (ref 4.0–10.5)

## 2014-10-15 LAB — I-STAT TROPONIN, ED: Troponin i, poc: 0.07 ng/mL (ref 0.00–0.08)

## 2014-10-15 MED ORDER — SODIUM CHLORIDE 0.9 % IV BOLUS (SEPSIS)
1000.0000 mL | Freq: Once | INTRAVENOUS | Status: AC
  Administered 2014-10-16: 1000 mL via INTRAVENOUS

## 2014-10-15 MED ORDER — HYDROMORPHONE HCL 1 MG/ML IJ SOLN
1.0000 mg | Freq: Once | INTRAMUSCULAR | Status: AC
  Administered 2014-10-16: 1 mg via INTRAVENOUS
  Filled 2014-10-15: qty 1

## 2014-10-15 NOTE — ED Provider Notes (Signed)
CSN: [id number]     Arrival date & time 10/11/2014  2231 History   First MD Initiated Contact with Patient 10/14/2014 2308     Chief Complaint  Patient presents with  . Weakness     (Consider location/radiation/quality/duration/timing/severity/associated sxs/prior Treatment) HPI [name] is a 59 y.o. female with history of depression, asthma, chronic pain, endometrial cancer, currently on hospice at home, presents to emergency department complaining of worsening pain, weakness, inability to ambulate. Patient states her symptoms have worsened in the last 3 days. She states that her mother who she lives with have been been home as usual and hasn't been able to help her. She states she hasn't gotten up in several days and hasn't been able to eat or drink anything. She states she couldn't get to the telephone to call for help. She states hospice comes by once a week. Patient's family called police today because they haven't heard from her in the last 3 days. When EMS got to the house, patient was tearful, screaming, complaining of pain and inability to ambulate, patient was brought here. patient states her pain is all over, mainly in the abdomen and back. She states she cannot walk. She she feels weak and dizzy. She also reports diarrhea and rectal bleeding. She states all the symptoms have been going on for sometime.   Past Medical History  Diagnosis Date  . Thyroid disease   . Arthritis   . Asthma   . Depression   . Fibromyalgia   . Lumbar spondylolysis   . Allergy   . Endometrial cancer   . Spinal stenosis   . Hypertension   . Dysrhythmia     OCCAS PAC'S AND PVC'S  PER HOLTER MONITOR STUDY --PT HAS OCCAS CHEST PAINS AND PALPITATIONS--HAD ECHO NORMAL LV SIZE AND FUNCTION.  Marland Kitchen Sleep apnea     HAS CPAP MACHINE BUT DOES NOT USE  . Anxiety     HAS OCD AND ON DISABILITY-PER [name]    Past Surgical History  Procedure Laterality Date  . Cesarean section    . Abdominal hysterectomy       total   Family History  Problem Relation Age of Onset  . Hypertension Mother   . Coronary artery disease Father   . Cancer Brother     Renal cell carcinoma  . Hypertension Other   . Cancer Other   . Heart attack Father   . Coronary artery disease Brother    History  Substance Use Topics  . Smoking status: Never Smoker   . Smokeless tobacco: Never Used  . Alcohol Use: No   OB History    Gravida Para Term Preterm AB TAB SAB Ectopic Multiple Living   1 1             Review of Systems  Constitutional: Positive for fatigue. Negative for fever and chills.  Respiratory: Negative for cough, chest tightness and shortness of breath.   Cardiovascular: Negative for chest pain, palpitations and leg swelling.  Gastrointestinal: Positive for nausea and abdominal pain. Negative for vomiting and diarrhea.  Genitourinary: Negative for dysuria, flank pain and pelvic pain.  Musculoskeletal: Positive for back pain. Negative for myalgias, arthralgias, neck pain and neck stiffness.  Skin: Negative for rash.  Neurological: Positive for weakness. Negative for dizziness and headaches.  All other systems reviewed and are negative.     Allergies  Sulfa antibiotics and Sulfamethoxazole-trimethoprim  Home Medications   Prior to Admission medications   Medication Sig  Start Date End Date Taking? Authorizing Provider  acetaminophen (TYLENOL) 500 MG tablet Take 1,000 mg by mouth every 6 (six) hours as needed.    Historical Provider, MD  albuterol (VENTOLIN HFA) 108 (90 BASE) MCG/ACT inhaler Inhale 2 puffs into the lungs every 4 (four) hours as needed for wheezing or shortness of breath. 01/15/14   Robbie Lis, MD  ARIPiprazole (ABILIFY) 5 MG tablet Take 1 tablet (5 mg total) by mouth 2 (two) times daily. 06/06/14   Simbiso Ranga, MD  b complex vitamins capsule Take 1 capsule by mouth daily.    Historical Provider, MD  dexamethasone (DECADRON) 4 MG tablet Take 1 tablet (4 mg total) by mouth  daily. 06/06/14   Simbiso Ranga, MD  docusate sodium (COLACE) 100 MG capsule Take 1 capsule (100 mg total) by mouth 2 (two) times daily. 06/06/14   Simbiso Ranga, MD  ergocalciferol (VITAMIN D2) 50000 UNITS capsule Take 50,000 Units by mouth once a week.    Historical Provider, MD  HYDROmorphone (DILAUDID) 4 MG tablet Take 1 tablet (4 mg total) by mouth every 4 (four) hours as needed for severe pain. 06/06/14   Simbiso Ranga, MD  ibuprofen (ADVIL,MOTRIN) 200 MG tablet Take 400 mg by mouth every 6 (six) hours as needed for moderate pain.    Historical Provider, MD  levothyroxine (SYNTHROID, LEVOTHROID) 125 MCG tablet Take 1 tablet (125 mcg total) by mouth daily before breakfast. 06/06/14   Simbiso Ranga, MD  LORazepam (ATIVAN) 1 MG tablet Take 1 tablet (1 mg total) by mouth 2 (two) times daily. 06/06/14   Simbiso Ranga, MD  Methylsulfonylmethane (MSM) 1000 MG TABS Take 1 tablet by mouth daily.    Historical Provider, MD  Multiple Vitamins-Minerals (MULTIVITAMIN) tablet Take 1 tablet by mouth daily. 01/24/14   Linton Flemings, MD  OVER THE COUNTER MEDICATION Omega X L. Takes 2 tablets per day.    Historical Provider, MD  oxybutynin (DITROPAN) 5 MG tablet Take 5 mg by mouth 3 (three) times daily.    Historical Provider, MD  sertraline (ZOLOFT) 100 MG tablet Take 200 mg by mouth every evening.  10/15/11   Srikar Janna Arch, MD  thiothixene (NAVANE) 2 MG capsule Take 2 mg by mouth at bedtime. 07/02/14   Historical Provider, MD  Vitamin D, Ergocalciferol, (DRISDOL) 50000 UNITS CAPS capsule Take 1 capsule (50,000 Units total) by mouth every 7 (seven) days. No specific days 06/06/14   Simbiso Ranga, MD   BP 112/7 mmHg  Pulse 118  Temp(Src) 98.1 F (36.7 C) (Oral)  Resp 16  SpO2 96% Physical Exam  Constitutional: She is oriented to person, place, and time. She appears well-developed and well-nourished.  Tearful  HENT:  Head: Normocephalic.  Oral mucosa dry  Eyes: Conjunctivae are normal.  Neck: Neck supple.   Cardiovascular: Normal rate, regular rhythm and normal heart sounds.   Pulmonary/Chest: Effort normal and breath sounds normal. No respiratory distress. She has no wheezes. She has no rales.  Abdominal: Soft. Bowel sounds are normal. There is tenderness. There is no rebound.  Distended, diffuse tenderness  Musculoskeletal: She exhibits no edema.  Neurological: She is alert and oriented to person, place, and time. No cranial nerve deficit. Coordination normal.  Skin: Skin is warm and dry. There is pallor.  Psychiatric: She has a normal mood and affect. Her behavior is normal.  Nursing note and vitals reviewed.   ED Course  Procedures (including critical care time) Labs Review Labs Reviewed  CBC WITH DIFFERENTIAL/PLATELET  COMPREHENSIVE METABOLIC PANEL  LIPASE, BLOOD  URINALYSIS, ROUTINE W REFLEX MICROSCOPIC (NOT AT [facility])  I-STAT TROPOININ, ED  I-STAT CG4 LACTIC ACID, ED    Imaging Review No results found.   EKG Interpretation None      MDM   Final diagnoses:  None    patient with advanced endometrial cancer, on home hospice, also history of chronic pain, anxiety, depression, here with increased pain, weakness, inability to walk. Patient reports no by mouth intake for 3 days. She is admitting to worsening pain. She appears to be dehydrated on exam. She appears to be pale. She is tachycardic. Will start IV fluids. Lab work ordered.  1:39 AM Pt continues to be tachycardic. Continues to have pain. Will get admitted for dehydration and failure to thrive. Pt refused cath for ua  Filed Vitals:   09/30/2014 2330 10/16/14 0000 10/16/14 0030 10/16/14 0100  BP: 126/67 112/67 99/62 107/57  Pulse: 117 114 109   Temp:      TempSrc:      Resp: 11 15 10 10   SpO2:       Pt is DNR.   Spoke with [facility], they will admit.   Jeannett Senior, PA-C 10-29-14 0831  Jeannett Senior, PA-C 11/06/14 [phone]   Medical screening examination/treatment/procedure(s) were performed by  non-physician practitioner and as supervising physician I was immediately available for consultation/collaboration.   EKG Interpretation   Date/Time:  Tuesday October 15 2014 22:44:40 EDT Ventricular Rate:  114 PR Interval:  122 QRS Duration: 73 QT Interval:  317 QTC Calculation: 436 R Axis:   2 Text Interpretation:  Sinus tachycardia Low voltage, extremity and  precordial leads Confirmed by [facility]  MD, APRIL ([id number]) on  10/16/2014 12:38:57 AM       Gareth Morgan MD  Gareth Morgan, MD 11/21/14 0700

## 2014-10-15 NOTE — ED Notes (Signed)
Bed: WA02 Expected date: 10/16/2014 Expected time: 10:08 PM Means of arrival: Ambulance Comments: Hypotensive

## 2014-10-15 NOTE — ED Notes (Signed)
Pt's family called sheriff's department to go for welfare check on pt because they had not seen or heard from her in 3 days. Pt is a hospice pt and told ems that she has had generalized weakness and pain for past 3 days and no po intake for past 3 days. EMS states that pt has been crying and scream for her mother that is living out of state and is in her 78s.

## 2014-10-16 ENCOUNTER — Observation Stay (HOSPITAL_COMMUNITY)

## 2014-10-16 ENCOUNTER — Encounter (HOSPITAL_COMMUNITY): Payer: Self-pay | Admitting: Radiology

## 2014-10-16 ENCOUNTER — Emergency Department (HOSPITAL_COMMUNITY)

## 2014-10-16 DIAGNOSIS — N131 Hydronephrosis with ureteral stricture, not elsewhere classified: Secondary | ICD-10-CM | POA: Diagnosis present

## 2014-10-16 DIAGNOSIS — G8929 Other chronic pain: Secondary | ICD-10-CM | POA: Diagnosis present

## 2014-10-16 DIAGNOSIS — Z79899 Other long term (current) drug therapy: Secondary | ICD-10-CM | POA: Diagnosis not present

## 2014-10-16 DIAGNOSIS — Z9114 Patient's other noncompliance with medication regimen: Secondary | ICD-10-CM | POA: Diagnosis present

## 2014-10-16 DIAGNOSIS — R531 Weakness: Secondary | ICD-10-CM | POA: Diagnosis present

## 2014-10-16 DIAGNOSIS — E039 Hypothyroidism, unspecified: Secondary | ICD-10-CM | POA: Diagnosis present

## 2014-10-16 DIAGNOSIS — G4730 Sleep apnea, unspecified: Secondary | ICD-10-CM | POA: Diagnosis present

## 2014-10-16 DIAGNOSIS — R319 Hematuria, unspecified: Secondary | ICD-10-CM | POA: Diagnosis present

## 2014-10-16 DIAGNOSIS — R1084 Generalized abdominal pain: Secondary | ICD-10-CM | POA: Diagnosis not present

## 2014-10-16 DIAGNOSIS — Z66 Do not resuscitate: Secondary | ICD-10-CM | POA: Diagnosis present

## 2014-10-16 DIAGNOSIS — C541 Malignant neoplasm of endometrium: Secondary | ICD-10-CM | POA: Diagnosis present

## 2014-10-16 DIAGNOSIS — F419 Anxiety disorder, unspecified: Secondary | ICD-10-CM | POA: Diagnosis present

## 2014-10-16 DIAGNOSIS — Z515 Encounter for palliative care: Secondary | ICD-10-CM

## 2014-10-16 DIAGNOSIS — I10 Essential (primary) hypertension: Secondary | ICD-10-CM | POA: Diagnosis present

## 2014-10-16 DIAGNOSIS — R197 Diarrhea, unspecified: Secondary | ICD-10-CM | POA: Diagnosis present

## 2014-10-16 DIAGNOSIS — Z8249 Family history of ischemic heart disease and other diseases of the circulatory system: Secondary | ICD-10-CM | POA: Diagnosis not present

## 2014-10-16 DIAGNOSIS — Z923 Personal history of irradiation: Secondary | ICD-10-CM | POA: Diagnosis not present

## 2014-10-16 DIAGNOSIS — A419 Sepsis, unspecified organism: Secondary | ICD-10-CM | POA: Diagnosis present

## 2014-10-16 DIAGNOSIS — J45909 Unspecified asthma, uncomplicated: Secondary | ICD-10-CM | POA: Diagnosis present

## 2014-10-16 DIAGNOSIS — R109 Unspecified abdominal pain: Secondary | ICD-10-CM

## 2014-10-16 DIAGNOSIS — R6521 Severe sepsis with septic shock: Secondary | ICD-10-CM | POA: Diagnosis present

## 2014-10-16 DIAGNOSIS — F4323 Adjustment disorder with mixed anxiety and depressed mood: Secondary | ICD-10-CM | POA: Diagnosis present

## 2014-10-16 DIAGNOSIS — M1990 Unspecified osteoarthritis, unspecified site: Secondary | ICD-10-CM | POA: Diagnosis present

## 2014-10-16 DIAGNOSIS — N179 Acute kidney failure, unspecified: Secondary | ICD-10-CM | POA: Diagnosis present

## 2014-10-16 DIAGNOSIS — K625 Hemorrhage of anus and rectum: Secondary | ICD-10-CM | POA: Diagnosis present

## 2014-10-16 DIAGNOSIS — Z8051 Family history of malignant neoplasm of kidney: Secondary | ICD-10-CM | POA: Diagnosis not present

## 2014-10-16 DIAGNOSIS — M797 Fibromyalgia: Secondary | ICD-10-CM | POA: Diagnosis present

## 2014-10-16 DIAGNOSIS — E860 Dehydration: Secondary | ICD-10-CM | POA: Diagnosis present

## 2014-10-16 DIAGNOSIS — C772 Secondary and unspecified malignant neoplasm of intra-abdominal lymph nodes: Secondary | ICD-10-CM | POA: Diagnosis present

## 2014-10-16 DIAGNOSIS — C787 Secondary malignant neoplasm of liver and intrahepatic bile duct: Secondary | ICD-10-CM | POA: Diagnosis present

## 2014-10-16 DIAGNOSIS — R627 Adult failure to thrive: Secondary | ICD-10-CM | POA: Diagnosis present

## 2014-10-16 LAB — COMPREHENSIVE METABOLIC PANEL
ALK PHOS: 171 U/L — AB (ref 38–126)
ALT: 74 U/L — ABNORMAL HIGH (ref 14–54)
AST: 83 U/L — ABNORMAL HIGH (ref 15–41)
Albumin: 2.4 g/dL — ABNORMAL LOW (ref 3.5–5.0)
Anion gap: 7 (ref 5–15)
BILIRUBIN TOTAL: 1.5 mg/dL — AB (ref 0.3–1.2)
BUN: 27 mg/dL — AB (ref 6–20)
CALCIUM: 8.3 mg/dL — AB (ref 8.9–10.3)
CO2: 26 mmol/L (ref 22–32)
CREATININE: 1.21 mg/dL — AB (ref 0.44–1.00)
Chloride: 103 mmol/L (ref 101–111)
GFR calc Af Amer: 55 mL/min — ABNORMAL LOW (ref >60)
GFR, EST NON AFRICAN AMERICAN: 48 mL/min — AB (ref >60)
Glucose, Bld: 92 mg/dL (ref 65–99)
Potassium: 3.8 mmol/L (ref 3.5–5.1)
SODIUM: 136 mmol/L (ref 135–145)
Total Protein: 6 g/dL — ABNORMAL LOW (ref 6.5–8.1)

## 2014-10-16 LAB — I-STAT CG4 LACTIC ACID, ED
LACTIC ACID, VENOUS: 1.4 mmol/L (ref 0.5–2.0)
Lactic Acid, Venous: 1.47 mmol/L (ref 0.5–2.0)

## 2014-10-16 LAB — BASIC METABOLIC PANEL
Anion gap: 9 (ref 5–15)
BUN: 25 mg/dL — AB (ref 6–20)
CALCIUM: 8.2 mg/dL — AB (ref 8.9–10.3)
CHLORIDE: 106 mmol/L (ref 101–111)
CO2: 23 mmol/L (ref 22–32)
CREATININE: 1.13 mg/dL — AB (ref 0.44–1.00)
GFR, EST AFRICAN AMERICAN: 60 mL/min — AB (ref >60)
GFR, EST NON AFRICAN AMERICAN: 52 mL/min — AB (ref >60)
GLUCOSE: 78 mg/dL (ref 65–99)
Potassium: 3.8 mmol/L (ref 3.5–5.1)
Sodium: 138 mmol/L (ref 135–145)

## 2014-10-16 LAB — CBC
HEMATOCRIT: 29.5 % — AB (ref 36.0–46.0)
Hemoglobin: 9.5 g/dL — ABNORMAL LOW (ref 12.0–15.0)
MCH: 28.4 pg (ref 26.0–34.0)
MCHC: 32.2 g/dL (ref 30.0–36.0)
MCV: 88.3 fL (ref 78.0–100.0)
Platelets: 113 10*3/uL — ABNORMAL LOW (ref 150–400)
RBC: 3.34 MIL/uL — AB (ref 3.87–5.11)
RDW: 15.3 % (ref 11.5–15.5)
WBC: 9.6 10*3/uL (ref 4.0–10.5)

## 2014-10-16 LAB — LIPASE, BLOOD: Lipase: <10 U/L — ABNORMAL LOW (ref 22–51)

## 2014-10-16 LAB — MRSA PCR SCREENING: MRSA BY PCR: NEGATIVE

## 2014-10-16 MED ORDER — ARIPIPRAZOLE 5 MG PO TABS
5.0000 mg | ORAL_TABLET | Freq: Two times a day (BID) | ORAL | Status: DC
  Filled 2014-10-16 (×9): qty 1

## 2014-10-16 MED ORDER — ONDANSETRON HCL 4 MG/2ML IJ SOLN: 4.0000 mg | Freq: Four times a day (QID) | INTRAMUSCULAR | Status: DC | PRN

## 2014-10-16 MED ORDER — CHLORHEXIDINE GLUCONATE 0.12 % MT SOLN
15.0000 mL | Freq: Two times a day (BID) | OROMUCOSAL | Status: DC
  Filled 2014-10-16 (×9): qty 15

## 2014-10-16 MED ORDER — DIPHENHYDRAMINE HCL 50 MG/ML IJ SOLN
12.5000 mg | Freq: Four times a day (QID) | INTRAMUSCULAR | Status: DC | PRN
  Filled 2014-10-16: qty 1

## 2014-10-16 MED ORDER — HYDROMORPHONE HCL 2 MG/ML IJ SOLN
2.0000 mg | INTRAMUSCULAR | Status: DC | PRN
  Administered 2014-10-16: 2 mg via INTRAVENOUS
  Filled 2014-10-16: qty 1

## 2014-10-16 MED ORDER — IOHEXOL 300 MG/ML  SOLN
100.0000 mL | Freq: Once | INTRAMUSCULAR | Status: AC | PRN
  Administered 2014-10-16: 100 mL via INTRAVENOUS

## 2014-10-16 MED ORDER — SENNOSIDES-DOCUSATE SODIUM 8.6-50 MG PO TABS: 1.0000 | ORAL_TABLET | Freq: Every evening | ORAL | Status: DC | PRN

## 2014-10-16 MED ORDER — HYDROMORPHONE HCL 4 MG PO TABS
4.0000 mg | ORAL_TABLET | ORAL | Status: DC | PRN
  Administered 2014-10-16 (×2): 4 mg via ORAL
  Filled 2014-10-16 (×3): qty 1

## 2014-10-16 MED ORDER — DIPHENHYDRAMINE HCL 12.5 MG/5ML PO ELIX: 12.5000 mg | ORAL_SOLUTION | Freq: Four times a day (QID) | ORAL | Status: DC | PRN

## 2014-10-16 MED ORDER — CETYLPYRIDINIUM CHLORIDE 0.05 % MT LIQD
7.0000 mL | Freq: Two times a day (BID) | OROMUCOSAL | Status: DC
  Administered 2014-10-18: 7 mL via OROMUCOSAL

## 2014-10-16 MED ORDER — HYDROMORPHONE HCL 1 MG/ML IJ SOLN
1.0000 mg | INTRAMUSCULAR | Status: DC | PRN
  Administered 2014-10-16 (×2): 1 mg via INTRAVENOUS
  Filled 2014-10-16 (×2): qty 1

## 2014-10-16 MED ORDER — POTASSIUM CHLORIDE IN NACL 20-0.45 MEQ/L-% IV SOLN
INTRAVENOUS | Status: DC
  Administered 2014-10-16 (×2): via INTRAVENOUS
  Filled 2014-10-16 (×3): qty 1000

## 2014-10-16 MED ORDER — SERTRALINE HCL 100 MG PO TABS
200.0000 mg | ORAL_TABLET | Freq: Every evening | ORAL | Status: DC
  Filled 2014-10-16 (×4): qty 2

## 2014-10-16 MED ORDER — LORAZEPAM 1 MG PO TABS
1.0000 mg | ORAL_TABLET | Freq: Two times a day (BID) | ORAL | Status: DC
  Filled 2014-10-16: qty 1

## 2014-10-16 MED ORDER — OXYBUTYNIN CHLORIDE 5 MG PO TABS
5.0000 mg | ORAL_TABLET | Freq: Three times a day (TID) | ORAL | Status: DC
  Filled 2014-10-16: qty 1

## 2014-10-16 MED ORDER — HYDROMORPHONE 0.3 MG/ML IV SOLN
INTRAVENOUS | Status: DC
  Administered 2014-10-16: 4.31 mg via INTRAVENOUS
  Administered 2014-10-16: 16:00:00 via INTRAVENOUS
  Administered 2014-10-17: 1.62 mg via INTRAVENOUS
  Administered 2014-10-17: 3.19 mg via INTRAVENOUS
  Administered 2014-10-17 (×3): via INTRAVENOUS
  Administered 2014-10-17: 2.73 mg via INTRAVENOUS
  Administered 2014-10-17: 3.26 mg via INTRAVENOUS
  Administered 2014-10-17: 1.89 mg via INTRAVENOUS
  Administered 2014-10-17: 3.12 mg via INTRAVENOUS
  Administered 2014-10-18: 2.99 mg via INTRAVENOUS
  Administered 2014-10-18: 1.73 mg via INTRAVENOUS
  Administered 2014-10-18: 4.02 mg via INTRAVENOUS
  Administered 2014-10-18: 3.1 mg via INTRAVENOUS
  Administered 2014-10-18: 3.47 mg via INTRAVENOUS
  Administered 2014-10-18: 1.95 mg via INTRAVENOUS
  Administered 2014-10-18: 2.67 mg via INTRAVENOUS
  Administered 2014-10-18 (×2): via INTRAVENOUS
  Filled 2014-10-16 (×6): qty 25

## 2014-10-16 MED ORDER — HYDROMORPHONE 0.3 MG/ML IV SOLN: INTRAVENOUS | Status: DC

## 2014-10-16 MED ORDER — LORAZEPAM 2 MG/ML IJ SOLN
INTRAMUSCULAR | Status: AC
  Filled 2014-10-16: qty 1

## 2014-10-16 MED ORDER — ONDANSETRON HCL 4 MG/2ML IJ SOLN
4.0000 mg | Freq: Once | INTRAMUSCULAR | Status: AC
  Administered 2014-10-16: 4 mg via INTRAVENOUS
  Filled 2014-10-16: qty 2

## 2014-10-16 MED ORDER — IOHEXOL 300 MG/ML  SOLN
50.0000 mL | Freq: Once | INTRAMUSCULAR | Status: AC | PRN
  Administered 2014-10-16: 25 mL via ORAL

## 2014-10-16 MED ORDER — HYDROMORPHONE HCL 1 MG/ML IJ SOLN
1.0000 mg | Freq: Once | INTRAMUSCULAR | Status: AC
  Administered 2014-10-16: 1 mg via INTRAVENOUS
  Filled 2014-10-16: qty 1

## 2014-10-16 MED ORDER — LORAZEPAM 2 MG/ML IJ SOLN
1.0000 mg | Freq: Four times a day (QID) | INTRAMUSCULAR | Status: DC
  Administered 2014-10-16 – 2014-10-18 (×9): 1 mg via INTRAVENOUS
  Filled 2014-10-16 (×8): qty 1

## 2014-10-16 MED ORDER — SODIUM CHLORIDE 0.9 % IV BOLUS (SEPSIS)
1000.0000 mL | Freq: Once | INTRAVENOUS | Status: AC
  Administered 2014-10-16: 1000 mL via INTRAVENOUS

## 2014-10-16 MED ORDER — SODIUM CHLORIDE 0.9 % IV SOLN
Freq: Once | INTRAVENOUS | Status: AC
  Administered 2014-10-16: 125 mL/h via INTRAVENOUS

## 2014-10-16 MED ORDER — LEVOTHYROXINE SODIUM 125 MCG PO TABS
125.0000 ug | ORAL_TABLET | Freq: Every day | ORAL | Status: DC
  Administered 2014-10-16: 125 ug via ORAL
  Filled 2014-10-16 (×5): qty 1

## 2014-10-16 NOTE — Progress Notes (Signed)
[place] was admitted last night for dehydration.This is a related and covered admission to her [facility] DX of Endometrial Cancer. Patient is a DNR. Patient reports her phone was uncharged and her family had been unable to reach her. She reports increasing right sided pain and weakness over the last few months. She reports no PO intake for 3 days. EMS was called to transport patient but [facility] was not called. Hospital [facility] called and notified this hospital liaison. Patient lying on right side with no family present. Patient was crying and stated her cancer has spread and she could not understand why her mother had not called her or come to the hospital. She reports ongoing pain and requesting more pain meds. Pt. Advised [facility] social worker B. Jerelene Redden was on her way to visit this afternoon and she may know more information about her mother. Staff RN Maudie Mercury, advised pt.is requesting more pain medication. Patient reassured that the hospital and [facility] will be working together on a plan to take care of her. [facility] medication list and transfer summary given to staff RN for review and to place on pt's shadow chart.  [facility] will continue to follow daily. Please call hospice with any questions.  [facility] Liaison [phone]

## 2014-10-16 NOTE — ED Notes (Signed)
Patient refused cath

## 2014-10-16 NOTE — ED Notes (Signed)
Went to obtain labs, MD at bedside. Will return for draw

## 2014-10-16 NOTE — Consult Note (Signed)
Consultation Note Date: 10/16/2014   Patient Name: [name]  DOB: 12-Oct-1955  MRN: [id number]  Age / Sex: 59 y.o., female   PCP: Claris Gladden. Ouida Sills, [place] Referring Physician: Nat Math, MD  Reason for Consultation: Disposition  Palliative Care Assessment and Plan Summary of Established Goals of Care and Medical Treatment Preferences   Clinical Assessment/Narrative: 59 yo with rapidly progressive metastatic uterine cancer. -New large liver mets -Completely obstructed bilateral ureters -Recto-vesicular fistula and bulky adenopthy  Difficult to control pain and anxiety- he CT scan suggests that she is going to rapidly decline -possibly from an acute intrabdominal event related to her tumor burden. Multi[ple psycho-social and psych issues.  [facility] CSW and [facility] RN at bedside.    Contacts/Participants in Discussion: Primary Decision Maker: Her mother   HCPOA: no    Code Status/Advance Care Planning:  DNR  Symptom Management:   Start Dilaudid infusion at 0.5 mg/hr and 2 mg load and 1mg  q30 minutes PRN   Scheduled Ativan and PRN Ativan  She is refusing PO meds  She will likely need Palliative sedation- severe intrabdominal tumor burden  Very high levels of anxiety  Palliative Prophylaxis: Ativan, Robinul, Bowel Regimen  CAUTION WITH A FOLEY- difficult anatomy given tumor invasion  Additional Recommendations (Limitations, Scope, Preferences):  Full comfort  Psycho-social/Spiritual:   Support System: poor- mother in her nineties- she also has a sister  Desire for further Chaplaincy support:yes  Prognosis: Hours - Days  Discharge Planning:  [place]       Chief Complaint/History of Present Illness: See Narrative  Primary Diagnoses  Present on Admission:  . Dehydration . Anxiety . Abdominal pain . Endometrial ca . Adjustment disorder with mixed anxiety and depressed mood . Depression . Essential hypertension .  Hypothyroidism  Palliative Review of Systems: NA I have reviewed the medical record, interviewed the patient and family, and examined the patient. The following aspects are pertinent.  Past Medical History  Diagnosis Date  . Thyroid disease   . Arthritis   . Asthma   . Depression   . Fibromyalgia   . Lumbar spondylolysis   . Allergy   . Spinal stenosis   . Hypertension   . Dysrhythmia     OCCAS PAC'S AND PVC'S  PER HOLTER MONITOR STUDY --PT HAS OCCAS CHEST PAINS AND PALPITATIONS--HAD ECHO NORMAL LV SIZE AND FUNCTION.  Marland Kitchen Sleep apnea     HAS CPAP MACHINE BUT DOES NOT USE  . Anxiety     HAS OCD AND ON DISABILITY-PER [name]   . Endometrial cancer    History   Social History  . Marital Status: Single    Spouse Name: N/A  . Number of Children: 1  . Years of Education: 12th   Occupational History  . disabled    Social History Main Topics  . Smoking status: Never Smoker   . Smokeless tobacco: Never Used  . Alcohol Use: No  . Drug Use: No  . Sexual Activity: No   Other Topics Concern  . None   Social History Narrative   Family History  Problem Relation Age of Onset  . Hypertension Mother   . Coronary artery disease Father   . Cancer Brother     Renal cell carcinoma  . Hypertension Other   . Cancer Other   . Heart attack Father   . Coronary artery disease Brother    Scheduled Meds: . antiseptic oral rinse  7 mL Mouth Rinse q12n4p  .  ARIPiprazole  5 mg Oral BID  . chlorhexidine  15 mL Mouth Rinse BID  . HYDROmorphone PCA 0.3 mg/mL   Intravenous 6 times per day  . levothyroxine  125 mcg Oral QAC breakfast  . LORazepam      . LORazepam  1 mg Intravenous Q6H  . sertraline  200 mg Oral QPM   Continuous Infusions:  PRN Meds:.diphenhydrAMINE **OR** diphenhydrAMINE, ondansetron (ZOFRAN) IV, senna-docusate Medications Prior to Admission:  Prior to Admission medications   Medication Sig Start Date End Date Taking? Authorizing Provider  acetaminophen  (TYLENOL) 500 MG tablet Take 1,000 mg by mouth every 6 (six) hours as needed.   Yes Historical Provider, MD  b complex vitamins capsule Take 1 capsule by mouth daily.   Yes Historical Provider, MD  dexamethasone (DECADRON) 4 MG tablet Take 1 tablet (4 mg total) by mouth daily. 06/06/14  Yes Simbiso Ranga, MD  docusate sodium (COLACE) 100 MG capsule Take 1 capsule (100 mg total) by mouth 2 (two) times daily. 06/06/14  Yes Simbiso Ranga, MD  HYDROmorphone (DILAUDID) 4 MG tablet Take 1 tablet (4 mg total) by mouth every 4 (four) hours as needed for severe pain. 06/06/14  Yes Simbiso Ranga, MD  levothyroxine (SYNTHROID, LEVOTHROID) 125 MCG tablet Take 1 tablet (125 mcg total) by mouth daily before breakfast. 06/06/14  Yes Simbiso Ranga, MD  LORazepam (ATIVAN) 1 MG tablet Take 1 tablet (1 mg total) by mouth 2 (two) times daily. 06/06/14  Yes Simbiso Ranga, MD  Methylsulfonylmethane (MSM) 1000 MG TABS Take 1 tablet by mouth daily.   Yes Historical Provider, MD  sertraline (ZOLOFT) 100 MG tablet Take 200 mg by mouth every evening.  10/15/11  Yes Srikar Janna Arch, MD  thiothixene (NAVANE) 2 MG capsule Take 2 mg by mouth at bedtime. 07/02/14  Yes Historical Provider, MD  Vitamin D, Ergocalciferol, (DRISDOL) 50000 UNITS CAPS capsule Take 1 capsule (50,000 Units total) by mouth every 7 (seven) days. No specific days 06/06/14  Yes Simbiso Ranga, MD  albuterol (VENTOLIN HFA) 108 (90 BASE) MCG/ACT inhaler Inhale 2 puffs into the lungs every 4 (four) hours as needed for wheezing or shortness of breath. 01/15/14   Robbie Lis, MD   Allergies  Allergen Reactions  . Sulfa Antibiotics Other (See Comments)    Unknown-pt states she is unsure of what her reaction is  . Sulfamethoxazole-Trimethoprim Other (See Comments)    Unknown reaction   CBC:    Component Value Date/Time   WBC 9.6 10/16/2014 0540   WBC 5.0 02/18/2014 1521   HGB 9.5* 10/16/2014 0540   HGB 12.9 02/18/2014 1521   HCT 29.5* 10/16/2014 0540   HCT 38.8  02/18/2014 1521   PLT 113* 10/16/2014 0540   PLT 163 02/18/2014 1521   MCV 88.3 10/16/2014 0540   MCV 87.3 02/18/2014 1521   NEUTROABS 7.4 10/21/2014 2326   NEUTROABS 2.8 02/18/2014 1521   LYMPHSABS 0.7 10/04/2014 2326   LYMPHSABS 1.5 02/18/2014 1521   MONOABS 1.0 10/12/2014 2326   MONOABS 0.5 02/18/2014 1521   EOSABS 0.1 09/30/2014 2326   EOSABS 0.1 02/18/2014 1521   BASOSABS 0.0 10/04/2014 2326   BASOSABS 0.0 02/18/2014 1521   Comprehensive Metabolic Panel:    Component Value Date/Time   NA 138 10/16/2014 0540   NA 139 02/18/2014 1521   K 3.8 10/16/2014 0540   K 4.2 02/18/2014 1521   CL 106 10/16/2014 0540   CO2 23 10/16/2014 0540   CO2 27 02/18/2014 1521  BUN 25* 10/16/2014 0540   BUN 7.8 02/18/2014 1521   CREATININE 1.13* 10/16/2014 0540   CREATININE 0.7 02/18/2014 1521   GLUCOSE 78 10/16/2014 0540   GLUCOSE 93 02/18/2014 1521   CALCIUM 8.2* 10/16/2014 0540   CALCIUM 9.9 02/18/2014 1521   AST 83* 10/20/2014 2326   ALT 74* 10/11/2014 2326   ALKPHOS 171* 10/18/2014 2326   BILITOT 1.5* 10/07/2014 2326   PROT 6.0* 09/28/2014 2326   ALBUMIN 2.4* 10/11/2014 2326    Physical Exam: Vital Signs: BP 116/72 mmHg  Pulse 100  Temp(Src) 98.7 F (37.1 C) (Oral)  Resp 16  SpO2 98% SpO2: SpO2: 98 % O2 Device: O2 Device: Not Delivered O2 Flow Rate:   Intake/output summary:  Intake/Output Summary (Last 24 hours) at 10/16/14 1621 Last data filed at 10/16/14 1451  Gross per 24 hour  Intake   1240 ml  Output    200 ml  Net   1040 ml   LBM: Last BM Date: 10/16/14 Baseline Weight:   Most recent weight:    Exam Findings:           Palliative Performance Scale: 30               Additional Data Reviewed: Recent Labs     10/16/2014  2326  10/16/14  0540  WBC  9.2  9.6  HGB  10.1*  9.5*  PLT  120*  113*  NA  136  138  BUN  27*  25*  CREATININE  1.21*  1.13*     Time In: 330-445PM  Time Total: 75 min Greater than 50%  of this time was spent counseling and  coordinating care related to the above assessment and plan.  Signed by: Roma Schanz, DO  10/16/2014, 4:21 PM  Please contact Palliative Medicine Team phone at [phone] for questions and concerns.

## 2014-10-16 NOTE — Progress Notes (Signed)
Pt is a [facility] pt.  Pt has been difficult to manage in the home due to her anxiety and denial of having cancer.  Pt often refuses hospice visits.Pt has been non-compliant with medications. Yesterday the hospice RN called her several times with no answer so went to the home but the door was locked and no-one came to the door.  LCSW went to the home this morning but no-one came to the door again.  LCSW later contacted by hospice laision, Barbra Sarks that pt was admitted to hospital.  Pt is very emotionally fragile.  Pt does not have a caregiver at home and even when pt's mother is with her, mother is not capable of providing care. [place], [place] ext: [phone]

## 2014-10-16 NOTE — H&P (Addendum)
PCP:   [name], FNP   Chief Complaint:  weakness  HPI: This is a 59 year old female with end-stage endometrial cancer, on home hospice. They see her once a week. History difficult to obtain this patient is very anxious and almost histrionic and therefore difficult to obtain a good detailed history. Patient lives at home with her 58 year old mom who apparently has also not been feeling well the past week. The patient has become and has no help for at least 3 days. She was found in her urine and feces per ER physician, however, the patient denies this. The patient complains of significant abdominal pain, weakness, not having had anything to eat or drink for 3 days. She complains of pain with defecation but this it doesn't appear to be new. She has pain medications for this. she reports some blood in her urine, this does not appear to be new either. Patient appears quite dehydrated, her oral mucosa is dry. She states she has only been able to tolerate small amounts of liquid. Three days ago her phone died and family members were unable to get in contact with her. They finally sent EMS workers to her doorstep today. She was brought to the ER.   Review of Systems:  The patient denies anorexia, fever, weight loss,, vision loss, decreased hearing, hoarseness, chest pain, syncope, dyspnea on exertion, peripheral edema, balance deficits, hemoptysis, abdominal pain, melena, hematochezia, severe indigestion/heartburn, hematuria, incontinence, genital sores, muscle weakness, suspicious skin lesions, transient blindness, difficulty walking, depression, unusual weight change, abnormal bleeding, enlarged lymph nodes, angioedema, and breast masses.  Past Medical History: Past Medical History  Diagnosis Date  . Thyroid disease   . Arthritis   . Asthma   . Depression   . Fibromyalgia   . Lumbar spondylolysis   . Allergy   . Endometrial cancer   . Spinal stenosis   . Hypertension   . Dysrhythmia      OCCAS PAC'S AND PVC'S  PER HOLTER MONITOR STUDY --PT HAS OCCAS CHEST PAINS AND PALPITATIONS--HAD ECHO NORMAL LV SIZE AND FUNCTION.  Marland Kitchen Sleep apnea     HAS CPAP MACHINE BUT DOES NOT USE  . Anxiety     HAS OCD AND ON DISABILITY-PER [name]    Past Surgical History  Procedure Laterality Date  . Cesarean section    . Abdominal hysterectomy      total    Medications: Prior to Admission medications   Medication Sig Start Date End Date Taking? Authorizing Provider  acetaminophen (TYLENOL) 500 MG tablet Take 1,000 mg by mouth every 6 (six) hours as needed.    Historical Provider, MD  albuterol (VENTOLIN HFA) 108 (90 BASE) MCG/ACT inhaler Inhale 2 puffs into the lungs every 4 (four) hours as needed for wheezing or shortness of breath. 01/15/14   Robbie Lis, MD  ARIPiprazole (ABILIFY) 5 MG tablet Take 1 tablet (5 mg total) by mouth 2 (two) times daily. 06/06/14   Simbiso Ranga, MD  b complex vitamins capsule Take 1 capsule by mouth daily.    Historical Provider, MD  dexamethasone (DECADRON) 4 MG tablet Take 1 tablet (4 mg total) by mouth daily. 06/06/14   Simbiso Ranga, MD  docusate sodium (COLACE) 100 MG capsule Take 1 capsule (100 mg total) by mouth 2 (two) times daily. 06/06/14   Simbiso Ranga, MD  ergocalciferol (VITAMIN D2) 50000 UNITS capsule Take 50,000 Units by mouth once a week.    Historical Provider, MD  HYDROmorphone (DILAUDID) 4 MG tablet Take  1 tablet (4 mg total) by mouth every 4 (four) hours as needed for severe pain. 06/06/14   Simbiso Ranga, MD  ibuprofen (ADVIL,MOTRIN) 200 MG tablet Take 400 mg by mouth every 6 (six) hours as needed for moderate pain.    Historical Provider, MD  levothyroxine (SYNTHROID, LEVOTHROID) 125 MCG tablet Take 1 tablet (125 mcg total) by mouth daily before breakfast. 06/06/14   Simbiso Ranga, MD  LORazepam (ATIVAN) 1 MG tablet Take 1 tablet (1 mg total) by mouth 2 (two) times daily. 06/06/14   Simbiso Ranga, MD  Methylsulfonylmethane (MSM) 1000 MG TABS  Take 1 tablet by mouth daily.    Historical Provider, MD  Multiple Vitamins-Minerals (MULTIVITAMIN) tablet Take 1 tablet by mouth daily. 01/24/14   Linton Flemings, MD  OVER THE COUNTER MEDICATION Omega X L. Takes 2 tablets per day.    Historical Provider, MD  oxybutynin (DITROPAN) 5 MG tablet Take 5 mg by mouth 3 (three) times daily.    Historical Provider, MD  sertraline (ZOLOFT) 100 MG tablet Take 200 mg by mouth every evening.  10/15/11   Srikar Janna Arch, MD  thiothixene (NAVANE) 2 MG capsule Take 2 mg by mouth at bedtime. 07/02/14   Historical Provider, MD  Vitamin D, Ergocalciferol, (DRISDOL) 50000 UNITS CAPS capsule Take 1 capsule (50,000 Units total) by mouth every 7 (seven) days. No specific days 06/06/14   Nat Math, MD    Allergies:   Allergies  Allergen Reactions  . Sulfa Antibiotics Other (See Comments)    Unknown-pt states she is unsure of what her reaction is  . Sulfamethoxazole-Trimethoprim Other (See Comments)    Unknown reaction    Social History:  reports that she has never smoked. She has never used smokeless tobacco. She reports that she does not drink alcohol or use illicit drugs.  Family History: Family History  Problem Relation Age of Onset  . Hypertension Mother   . Coronary artery disease Father   . Cancer Brother     Renal cell carcinoma  . Hypertension Other   . Cancer Other   . Heart attack Father   . Coronary artery disease Brother     Physical Exam: Filed Vitals:   10/16/14 0000 10/16/14 0030 10/16/14 0100 10/16/14 0232  BP: 112/67 99/62 107/57 116/65  Pulse: 114 109  112  Temp:    98.3 F (36.8 C)  TempSrc:    Oral  Resp: 15 10 10 9   SpO2:    96%    General:  Alert and oriented times three, very anxious and emotional Eyes: PERRLA, pink conjunctiva, no scleral icterus ENT: Moist oral mucosa, neck supple, no thyromegaly Lungs: clear to ascultation, no wheeze, no crackles, no use of accessory muscles Cardiovascular: regular rate and rhythm, no  regurgitation, no gallops, no murmurs. No carotid bruits, no JVD Abdomen: soft, positive BS, non-tender, non-distended, no organomegaly, not an acute abdomen GU: not examined Neuro: CN II - XII grossly intact, sensation intact Musculoskeletal: strength 5/5 all extremities, no clubbing, cyanosis or edema Skin: no rash, no subcutaneous crepitation, no decubitus Psych: Anxious patient   Labs on Admission:   Recent Labs  10/06/2014 2326  NA 136  K 3.8  CL 103  CO2 26  GLUCOSE 92  BUN 27*  CREATININE 1.21*  CALCIUM 8.3*    Recent Labs  10/21/2014 2326  AST 83*  ALT 74*  ALKPHOS 171*  BILITOT 1.5*  PROT 6.0*  ALBUMIN 2.4*    Recent Labs  10/18/2014 2326  LIPASE <10*    Recent Labs  10/02/2014 2326  WBC 9.2  NEUTROABS 7.4  HGB 10.1*  HCT 30.7*  MCV 88.2  PLT 120*   Micro Results: No results found for this or any previous visit (from the past 240 hour(s)).   Radiological Exams on Admission: No results found.  Assessment/Plan Present on Admission:  . Dehydration Acute kidney injury -. Adjustment disorder with mixed anxiety and depressed mood . Anxiety -Bring in for 23 hour observation  -IV fluid hydration ordered -Resumed patient's Medications of Zoloft, Abilify, Ativan  -Pain medications as needed  . Abdominal pain Transaminitis -CT abdomen and pelvis to evaluate cause of acute change -hepatitis panel -patient gives h/o gallbladder disease which has not been surgically treated -NPO, INF hydration Diarrhea -C diff toxin ordered -UA ordered and pending and occult blood in stool ordered to evaluate for blood as patient reports some blood but is unclear as to where she is bleeding . end stage Endometrial ca -consult case manager and palliative care. Patient may need to be an inpatient hospice is living with her 77 year old mother. Patient has severe anxiety she appears to need more support than a once weekly visit, especially in the event that her mom becomes  ill. . Essential hypertension . Hypothyroidism -Stable, home medications resumed   SCD's DVT perophylaxis  [name] 10/16/2014, 3:29 AM

## 2014-10-16 NOTE — Progress Notes (Signed)
I have attempted to contact pt's family but have been unable to reach them.  Her mother, Milinda Pointer number is [phone] and her sister, Rudy Jew number is [phone].  They do not know that she has been hospitalized.  [place], [place] ext: [phone]

## 2014-10-16 NOTE — Progress Notes (Signed)
I have seen and examined [name] at bedside and reviewed her chart. Appreciate palliative care. [name] is a pleasant 59 year old female known to me from previous hospitalization in February who has metastatic endometrial CA and underwent palliative radiation and is on home hospice but came in this time with complaints of not feeling well in the past week with increased abdominal pain and weakness. She was found to be dehydrated and CT abdomen and pelvis done today shows increased tumor burden- "1. Overall disease progression. - New severe metastatic disease to the liver with mild hepatomegaly. - Progressed fungating pelvic mass with strong evidence now of invasion of both the urinary bladder and rectum, and subsequent fistulization. - Progressed malignant obstruction of the distal ureters with up to severe hydronephrosis, greater on the right. 2. Progressed retroperitoneal metastatic lymphadenopathy. Inguinal lymph nodes are increased and now suspicious for metastatic involvement. Pelvic sidewall lymphadenopathy has regressed. 3. No mechanical bowel obstruction at this time". There is no clear evidence to suggest infection at this point. The end point in terms of goals of care is not very clear at present. We'll therefore defer to palliative care, meanwhile focus on keeping patient comfortable. Will increase dose of Dilaudid as she says the 1 mg dose is inadequate. Please refer to Dr. Berle Mull comprehensive H&P for detailed assessment and care plan.

## 2014-10-16 NOTE — Progress Notes (Signed)
Palliative medicine team consult received- I have reviewed this patient's records in detail. She was seen by our team about six months ago at which time the recommendation for hospice care was made. Will need to determine if this is a related admission- [facility] is documented as the hospice provider in her oncology office records. Will obtain information from her hospice providers and see her today.  Lane Hacker, DO Palliative Medicine [phone]

## 2014-10-16 NOTE — Progress Notes (Addendum)
    10/16/14 1100  Clinical Encounter Type  Visited With Patient  Visit Type Initial;Spiritual support  Referral From Nurse  Consult/Referral To Nurse  Recommendations follow up for support around anxiety  (continue to follow for advance directive support )  Spiritual Encounters  Spiritual Needs Emotional  Stress Factors  Patient Stress Factors Lack of knowledge;Other (Comment);Health changes  Advance Directives (For Healthcare)  Does patient have an advance directive? No   Chaplain support at beside in response to spiritual care consult.   Consult re: advance directives.  Pt [name]) was unable to have conversation about advance directives due to anxiety and physical pain.    [name] reports significant abdominal pain.  Fluctuates between speaking about worry around progression of cancer and stating she is unable to talk due to pain.  She presents as anxious, consistently stating throughout encounter that "no one can know how much pain I am in" and "I need someone to help me now."  Perceives jovial conversation of nursing staff and transport to CT as inappropriate because she feels it does not acknowledge how much pain she is experiencing.    [name] is fearful that her cancer has progressed and seemed to become more calm and present when chaplain acknowledged this fear.  She briefly mentioned [place] tradition in encounter.  She ended encounter due to pain prior to chaplain being able to assess spiritual resources.  Will continue to follow for support during admission.   Reported pain level to nursing.    [place], [place]

## 2014-10-16 NOTE — Care Management Note (Signed)
Case Management Note  Patient Details  Name: [name] MRN: [id number] Date of Birth: 03/05/1956  Subjective/Objective:  From home w/[facility] following.  PT cons-await recommendations.                Action/Plan:d/c plan return home w/[facility].   Expected Discharge Date:   (unknown)               Expected Discharge Plan:  Home w Hospice Care  In-House Referral:     Discharge planning Services  CM Consult  Post Acute Care Choice:    Choice offered to:  Patient  DME Arranged:    DME Agency:  Hospice and [place]:    [facility] Agency:     Status of Service:  In process, will continue to follow  Medicare Important Message Given:    Date Medicare IM Given:    Medicare IM give by:    Date Additional Medicare IM Given:    Additional Medicare Important Message give by:     If discussed at [place] of Stay Meetings, dates discussed:    Additional Comments:  Dessa Phi, RN 10/16/2014, 1:15 PM

## 2014-10-16 NOTE — ED Notes (Signed)
Called unit to give report. Secretary says the nurse will call.

## 2014-10-16 NOTE — Progress Notes (Signed)
Pt pulled IV out. Pt was stuck twice attempting to get an IV in. IV team consult placed. Family member at bedside.

## 2014-10-17 DIAGNOSIS — C541 Malignant neoplasm of endometrium: Principal | ICD-10-CM

## 2014-10-17 LAB — HEPATITIS PANEL, ACUTE
HEP A IGM: NEGATIVE
HEP B C IGM: NEGATIVE
HEP B S AG: NEGATIVE

## 2014-10-17 LAB — CLOSTRIDIUM DIFFICILE BY PCR: Toxigenic C. Difficile by PCR: NEGATIVE

## 2014-10-17 NOTE — Progress Notes (Signed)
[facility] PROGRESS NOTE  [name] [name] OZH:[id number] DOB: [birth year] DOA: 09/27/2014 PCP: [name]., FNP  Summary 10/16/2014: I have seen and examined [name] at bedside and reviewed her chart. Appreciate palliative care. [name] is a pleasant 59 year old female known to me from previous hospitalization in February who has metastatic endometrial CA and underwent palliative radiation and is on home hospice but came in this time with complaints of not feeling well in the past week with increased abdominal pain and weakness. She was found to be dehydrated and CT abdomen and pelvis done today shows increased tumor burden- "1. Overall disease progression. - New severe metastatic disease to the liver with mild hepatomegaly. - Progressed fungating pelvic mass with strong evidence now of invasion of both the urinary bladder and rectum, and subsequent fistulization. - Progressed malignant obstruction of the distal ureters with up to severe hydronephrosis, greater on the right. 2. Progressed retroperitoneal metastatic lymphadenopathy. Inguinal lymph nodes are increased and now suspicious for metastatic involvement. Pelvic sidewall lymphadenopathy has regressed. 3. No mechanical bowel obstruction at this time". There is no clear evidence to suggest infection at this point. The end point in terms of goals of care is not very clear at present. We'll therefore defer to palliative care, meanwhile focus on keeping patient comfortable. Will increase dose of Dilaudid as she says the 1 mg dose is inadequate. Please refer to Dr. Berle Mull comprehensive H&P for detailed assessment and care plan. 10/17/2014: Appreciate palliative care/hospice. Discussed clinical condition with mother at bedside. At this point, patient is best served by referral to hospice home, as it does not appear that the mother is able to assist the patient, despite being very supportive. Patient is sedated and seems  comfortable. We'll continue comfort care and follow palliative care/hospice recommendations. Patient is likely to pass soon. Plan Endometrial ca progressive with metastasis and abdominal pain  Focus on comfort care. Continue PCA pump/Ativan as needed Adjustment disorder with mixed anxiety and depressed mood/Hypothyroidism/Essential hypertension/Depression/Dehydration/Anxiety  Patient sedated and able to take medications. Code Status: DNR Family Communication: Mother at bedside Disposition Plan: Defer to hospice   Consultants:  Palliative care/hospice  Procedures:  None  Antibiotics:  None  HPI/Subjective: Unresponsive.  Objective: Filed Vitals:   10/17/14 0530  BP: 120/66  Pulse: 104  Temp: 98 F (36.7 C)  Resp: 16    Intake/Output Summary (Last 24 hours) at 10/17/14 1916 Last data filed at 10/16/14 1926  Gross per 24 hour  Intake    120 ml  Output      0 ml  Net    120 ml   There were no vitals filed for this visit.  Exam: Unresponsive.  Data Reviewed: Basic Metabolic Panel:  Recent Labs Lab 10/14/2014 2326 10/16/14 0540  NA 136 138  K 3.8 3.8  CL 103 106  CO2 26 23  GLUCOSE 92 78  BUN 27* 25*  CREATININE 1.21* 1.13*  CALCIUM 8.3* 8.2*   Liver Function Tests:  Recent Labs Lab 10/06/2014 2326  AST 83*  ALT 74*  ALKPHOS 171*  BILITOT 1.5*  PROT 6.0*  ALBUMIN 2.4*    Recent Labs Lab 10/21/2014 2326  LIPASE <10*   No results for input(s): AMMONIA in the last 168 hours. CBC:  Recent Labs Lab 10/02/2014 2326 10/16/14 0540  WBC 9.2 9.6  NEUTROABS 7.4  --   HGB 10.1* 9.5*  HCT 30.7* 29.5*  MCV 88.2 88.3  PLT 120* 113*   Cardiac Enzymes: No results for  input(s): CKTOTAL, CKMB, CKMBINDEX, TROPONINI in the last 168 hours. BNP (last 3 results) No results for input(s): BNP in the last 8760 hours.  ProBNP (last 3 results) No results for input(s): PROBNP in the last 8760 hours.  CBG: No results for input(s): GLUCAP in the last 168  hours.  Recent Results (from the past 240 hour(s))  MRSA PCR Screening     Status: None   Collection Time: 10/16/14  4:21 AM  Result Value Ref Range Status   MRSA by PCR NEGATIVE NEGATIVE Final    Comment:        The GeneXpert MRSA Assay (FDA approved for NASAL specimens only), is one component of a comprehensive MRSA colonization surveillance program. It is not intended to diagnose MRSA infection nor to guide or monitor treatment for MRSA infections.   Clostridium Difficile by PCR (not at [facility])     Status: None   Collection Time: 10/17/14 10:44 AM  Result Value Ref Range Status   C difficile by pcr NEGATIVE NEGATIVE Final     Studies: Ct Abdomen Pelvis W Contrast  10/16/2014   CLINICAL DATA:  59 year old female with generalized abdominal pain in weakness. Has not had anything [place] drink for 3 days. Abnormal LFTs. Current history of end-stage endometrial cancer on hospice. Subsequent encounter.  EXAM: CT ABDOMEN AND PELVIS WITH CONTRAST  TECHNIQUE: Multidetector CT imaging of the abdomen and pelvis was performed using the standard protocol following bolus administration of intravenous contrast.  CONTRAST:  154mL OMNIPAQUE IOHEXOL 300 MG/ML  SOLN  COMPARISON:  CT Abdomen and Pelvis 05/22/2014 and earlier.  FINDINGS: Mild scarring in atelectasis at the lung bases has not significantly changed. No definite pulmonary metastasis. Trace right pleural effusion. No pericardial effusion.  Stable visualized osseous structures  Bulky and diffuse metastatic disease throughout the liver is new since February. There is mild associated hepatomegaly. Individual metastases measure up to 5 cm each, with all lobes affected.  Stable mild splenomegaly. Pancreas and adrenal glands are within normal limits. The portal venous system is patent. Major arterial structures in the abdomen and pelvis are patent. No pneumoperitoneum. Trace perihepatic free fluid. Otherwise no free fluid in the abdomen.  Chronic right  renal obstruction with hydronephrosis and hydroureter has progressed and is severe. Subtle asymmetric decreased enhancement of the right kidney, with no right renal contrast excretion on delayed images. Mild left hydronephrosis is more stable in appearance, but also has mildly progressed since February. There is contrast excretion to the left renal collecting system on delayed images. A retroperitoneal lymph node at the level of the right kidney has enlarged since February, now 10 mm short axis (6 mm previously). A more inferior right of midline retroperitoneal node also has enlarged and is rounded, now 11 mm (previously 5-6 mm).  Both ureters appear obstructed at the level of an a more phos central pelvic soft tissue mass which appears to have invaded both the urinary bladder and rectum. The gas contains air in fluid. The urinary bladder contains some air. The rectum and sigmoid colon are thick walled. Retroperitoneal nodes superior to the mass have mildly increased to 12 mm (previously 10 mm). At the same time pelvis sidewall lymphadenopathy has regressed since February, now 14 mm (previously 20 mm).  Stable presacral stranding. No pelvic free fluid identified increased and maximal inguinal lymph nodes now up to 12 mm greater on the left.  Upstream of the sigmoid colon the large bowel is redundant and contains stool. Oral contrast has reached  the transverse colon. No dilated small bowel loops. Oral contrast in the stomach and duodenum which are largely decompressed.  IMPRESSION: 1. Overall disease progression. - New severe metastatic disease to the liver with mild hepatomegaly. - Progressed fungating pelvic mass with strong evidence now of invasion of both the urinary bladder and rectum, and subsequent fistulization. - Progressed malignant obstruction of the distal ureters with up to severe hydronephrosis, greater on the right. 2. Progressed retroperitoneal metastatic lymphadenopathy. Inguinal lymph nodes are  increased and now suspicious for metastatic involvement. Pelvic sidewall lymphadenopathy has regressed. 3. No mechanical bowel obstruction at this time.   Electronically Signed   By: Genevie Ann M.D.   On: 10/16/2014 11:01    Scheduled Meds: . antiseptic oral rinse  7 mL Mouth Rinse q12n4p  . ARIPiprazole  5 mg Oral BID  . chlorhexidine  15 mL Mouth Rinse BID  . HYDROmorphone PCA 0.3 mg/mL   Intravenous 6 times per day  . levothyroxine  125 mcg Oral QAC breakfast  . LORazepam  1 mg Intravenous Q6H  . sertraline  200 mg Oral QPM   Continuous Infusions:    Time spent: 25 minutes    Shayan Bramhall  [facility] Pager [phone]. If 7PM-7AM, please contact night-coverage at [email or web address], password [facility] 10/17/2014, 7:16 PM  LOS: 1 day

## 2014-10-17 NOTE — Progress Notes (Signed)
[name], Berenice Primas in room with pt.  Pt resting and did not arouse.  Educated mother that pt is very sick and that she in not expected to live much longer.  Discussed [facility] as an option.  Mother wants to get input from pt before making a decision about [employer] [facility].  Mother has medical questions about pt's condition that LCSW cannot answer. Mother would like to speak with pt's physician.  Request made for follow up by Dr. Hilma Favors.  Mother struggling with feelings of grief and loss.  Let [facility] know that mother wants pt's input before making a decision about [employer] [facility].  LCSW can be contacted to talk with pt if needed.  [place], [place] ext: [phone]

## 2014-10-17 NOTE — Progress Notes (Signed)
[place] was admitted 2 nights ago  for dehydration.This is a related and covered admission to her [facility] DX of Endometrial Cancer. Patient is a DNR. Patient reports her phone was uncharged and her family had been unable to reach her. She reports increasing right sided pain and weakness over the last few months. She reports no PO intake for 3 days.  Patient lying in her bed sleeping with her mother present sleeping on a cot in the room. Patient has had a total of 11 mg of dilaudid IV since yesterday and 3 mg total IV of ativan. Chart review shows pt. Ate  25 % of her dinner last night. Dr. Hilma Favors in to see patient this morning.  [facility] medication list and transfer summary are on pt's shadow chart. [facility] will continue to follow daily. Please call hospice with any questions.  [facility] Liaison [phone]

## 2014-10-17 NOTE — Progress Notes (Signed)
Cleansed pt of urinary incontinence, noted a few small blood clots in the perineal area which is new. Pt has been wearing ted hose from home since admit and noted that they were cutting into her legs around the top. Removed ted hose and found legs from knees to feet covered in dry/dead skin that was flaking off. Pt did not want [name] to touch or clean her legs and wanted [name] to leave her alone. Pt has appeared comfortable other than becoming upset during incontinence care; resting but arouses to voice. Mother at bedside. Continue to monitor. Hortencia Conradi RN

## 2014-10-18 MED ORDER — SCOPOLAMINE 1 MG/3DAYS TD PT72
1.0000 | MEDICATED_PATCH | TRANSDERMAL | Status: DC
  Administered 2014-10-18: 1.5 mg via TRANSDERMAL
  Filled 2014-10-18: qty 1

## 2014-10-18 MED ORDER — KETOROLAC TROMETHAMINE 15 MG/ML IJ SOLN
15.0000 mg | Freq: Four times a day (QID) | INTRAMUSCULAR | Status: DC | PRN
  Administered 2014-10-18 (×3): 15 mg via INTRAVENOUS
  Filled 2014-10-18 (×2): qty 1

## 2014-10-18 MED ORDER — KETOROLAC TROMETHAMINE 30 MG/ML IJ SOLN
30.0000 mg | Freq: Once | INTRAMUSCULAR | Status: AC
Start: 2014-10-18 — End: 2014-10-18

## 2014-10-18 MED ORDER — LIP MEDEX EX OINT
TOPICAL_OINTMENT | CUTANEOUS | Status: AC
  Administered 2014-10-18: 20:00:00
  Filled 2014-10-18: qty 7

## 2014-10-18 NOTE — Progress Notes (Signed)
Mother accepting of [facility] option.  [facility] can take pt tomorrow.  Pt to have PICC line prior to move to [facility].  [place], [place] ext: [phone]

## 2014-10-18 NOTE — Progress Notes (Signed)
Checked AM vitals, pt had temp of 101.7 and HR 150. Has no orders for tylenol, has been refusing all PO pills and gets agitated when staff touches her so suppository not a good choice either. Called PA and received order to try IV toradol for fever. Will monitor for effect. Pt has been resting throughout shift and doesn't appear uncomfortable. Hortencia Conradi RN

## 2014-10-18 NOTE — Progress Notes (Signed)
[place] was admitted 2 nights ago for dehydration.This is a related and covered admission to her [facility] DX of Endometrial Cancer. Patient is a DNR. Patient reports her phone was uncharged and her family had been unable to reach her. She reports increasing right sided pain and weakness over the last few months. She reports no PO intake for 3 days.  Pt. Sleeping in the bed and appears comfortable, [place] [facility] LCSW present talking with pt's mother about transferring pt. To [facility] for EOL care. Staff reports pt. Has  been sleeping. Pt. Has had 7 doses of Dilaudid from her PCA pump in the last 24 hours and received 15 mg of Toradol IV at 528 am.She has not been awake enough to take any PO fluid other than 1 sip of water yesterday. PICC will be placed later today for PCA infusion and pt. Will transfer to [facility] in the morning.  Please call with any questions.  [place] Liaison [phone]

## 2014-10-18 NOTE — Progress Notes (Signed)
[facility] PROGRESS NOTE  [name] [name]:[id number] DOB: 09/23/1955 DOA: 10/05/2014 PCP: [name], FNP  Summary 10/16/2014: I have seen and examined [name] at bedside and reviewed her chart. Appreciate palliative care. [name] is a pleasant 59 year old female known to me from previous hospitalization in February who has metastatic endometrial CA and underwent palliative radiation and is on home hospice but came in this time with complaints of not feeling well in the past week with increased abdominal pain and weakness. She was found to be dehydrated and CT abdomen and pelvis done today shows increased tumor burden- "1. Overall disease progression. - New severe metastatic disease to the liver with mild hepatomegaly. - Progressed fungating pelvic mass with strong evidence now of invasion of both the urinary bladder and rectum, and subsequent fistulization. - Progressed malignant obstruction of the distal ureters with up to severe hydronephrosis, greater on the right. 2. Progressed retroperitoneal metastatic lymphadenopathy. Inguinal lymph nodes are increased and now suspicious for metastatic involvement. Pelvic sidewall lymphadenopathy has regressed. 3. No mechanical bowel obstruction at this time". There is no clear evidence to suggest infection at this point. The end point in terms of goals of care is not very clear at present. We'll therefore defer to palliative care, meanwhile focus on keeping patient comfortable. Will increase dose of Dilaudid as she says the 1 mg dose is inadequate. Please refer to Dr. Berle Mull comprehensive H&P for detailed assessment and care plan. 10/17/2014: Appreciate palliative care/hospice. Discussed clinical condition with mother at bedside. At this point, patient is best served by referral to hospice home, as it does not appear that the mother is able to assist the patient, despite being very supportive. Patient is sedated and seems  comfortable. We'll continue comfort care and follow palliative care/hospice recommendations. Patient is likely to pass soon. 10/18/2014: Patient unresponsive. She has fever. May be septic. Agree with plans for [facility] in a.m. Discussed plan of care with patient's mother at the bedside. Plan Endometrial ca progressive with metastasis and abdominal pain/sepsis  Focus on comfort care. Address fever as necessary. Continue PCA pump/Ativan as needed  For [facility] in am. Adjustment disorder with mixed anxiety and depressed mood/Hypothyroidism/Essential hypertension/Depression/Dehydration/Anxiety  Patient sedated and unable to take medications. Code Status: DNR Family Communication: Mother at bedside Disposition Plan: Defer to hospice   Consultants:  Palliative care/hospice  Procedures:  None  Antibiotics:  None  HPI/Subjective: Unresponsive  Objective: Filed Vitals:   10/18/14 1458  BP: 103/68  Pulse: 149  Temp: 103.2 F (39.6 C)  Resp: 16   No intake or output data in the 24 hours ending 10/18/14 1720 There were no vitals filed for this visit.  Exam:   On PCA pump. Unresponsive. Seems comfortable.  Data Reviewed: Basic Metabolic Panel:  Recent Labs Lab 10/01/2014 2326 10/16/14 0540  NA 136 138  K 3.8 3.8  CL 103 106  CO2 26 23  GLUCOSE 92 78  BUN 27* 25*  CREATININE 1.21* 1.13*  CALCIUM 8.3* 8.2*   Liver Function Tests:  Recent Labs Lab 10/25/2014 2326  AST 83*  ALT 74*  ALKPHOS 171*  BILITOT 1.5*  PROT 6.0*  ALBUMIN 2.4*    Recent Labs Lab 10/09/2014 2326  LIPASE <10*   No results for input(s): AMMONIA in the last 168 hours. CBC:  Recent Labs Lab 10/05/2014 2326 10/16/14 0540  WBC 9.2 9.6  NEUTROABS 7.4  --   HGB 10.1* 9.5*  HCT 30.7* 29.5*  MCV 88.2 88.3  PLT 120* 113*   Cardiac Enzymes: No results for input(s): CKTOTAL, CKMB, CKMBINDEX, TROPONINI in the last 168 hours. BNP (last 3 results) No results for input(s): BNP in  the last 8760 hours.  ProBNP (last 3 results) No results for input(s): PROBNP in the last 8760 hours.  CBG: No results for input(s): GLUCAP in the last 168 hours.  Recent Results (from the past 240 hour(s))  MRSA PCR Screening     Status: None   Collection Time: 10/16/14  4:21 AM  Result Value Ref Range Status   MRSA by PCR NEGATIVE NEGATIVE Final    Comment:        The GeneXpert MRSA Assay (FDA approved for NASAL specimens only), is one component of a comprehensive MRSA colonization surveillance program. It is not intended to diagnose MRSA infection nor to guide or monitor treatment for MRSA infections.   Clostridium Difficile by PCR (not at [facility])     Status: None   Collection Time: 10/17/14 10:44 AM  Result Value Ref Range Status   C difficile by pcr NEGATIVE NEGATIVE Final     Studies: No results found.  Scheduled Meds: . antiseptic oral rinse  7 mL Mouth Rinse q12n4p  . ARIPiprazole  5 mg Oral BID  . chlorhexidine  15 mL Mouth Rinse BID  . HYDROmorphone PCA 0.3 mg/mL   Intravenous 6 times per day  . levothyroxine  125 mcg Oral QAC breakfast  . LORazepam  1 mg Intravenous Q6H  . scopolamine  1 patch Transdermal Q72H  . sertraline  200 mg Oral QPM   Continuous Infusions:    Time spent: 25 minutes    Aubrei Bouchie  [facility] Pager [phone]. If 7PM-7AM, please contact night-coverage at [email or web address], password [facility] 10/18/2014, 5:20 PM  LOS: 2 days

## 2014-10-18 NOTE — Progress Notes (Signed)
[facility] has been managing pt discharge plan and [facility] social worker, Ollen Gross contacted this CSW to notify this CSW that pt has bed available at [facility]. Pt mother accepting of transition to [facility].   Weekend CSW to facilitate pt discharge needs to [facility]. Weekend Education officer, museum to [employer] [facility] [place] volunteer that has been arranged to transport pt mother to [employer] when [place] is arranged for pt.   CSW to continue to follow.  Alison Murray, MSW, [place] Work [phone]

## 2014-10-21 ENCOUNTER — Ambulatory Visit: Payer: Self-pay | Admitting: Internal Medicine

## 2014-10-28 NOTE — Significant Event (Addendum)
While rounding on pt, found pt without respirations. No breath sounds, no pulse. Verified with Azzie Glatter RN. Paged [place] NP. [name] at bedside notified that pt had passed. [name] in great distress, struggling to comprehend that pt had passed. Chaplain paged. Pt's sister notified called. Noreene Larsson RN, BSN

## 2014-10-28 NOTE — Progress Notes (Signed)
Chaplain responded to request by Nursing Unit to provide spiritual support to family members of patient who had just died.  Upon arrival to the patient"s room the mother and sister were at the bedside of the patient and were greatly distressed at the death of the patient.  Chaplain provided the ministry of listening as they shared memories of the patient.  They shared they were people of faith, but because of the close bond the mother and patient shared it was very emotionaly difficult for her at this time. Chaplain extended comfort measures, encouragement, and prayer for the family.  Chaplain assisted the family with gathering personal belongings, and escorted them to their car. They were appreciative of all support from the Nursing Staff. [place] [phone]

## 2014-10-28 DEATH — deceased

## 2014-12-10 DIAGNOSIS — R6521 Severe sepsis with septic shock: Secondary | ICD-10-CM

## 2014-12-10 DIAGNOSIS — A419 Sepsis, unspecified organism: Secondary | ICD-10-CM | POA: Diagnosis present

## 2014-12-28 NOTE — Discharge Summary (Addendum)
Death Summary  [name] HQR:[id number] DOB: 1955-09-21 DOA: 10/28/14  PCP: [name], FNP PCP/Office notified:   Admit date: 10/28/2014 Date of Death: 11/01/14  Final Diagnoses:  Active Problems:   Endometrial ca   Severe sepsis with septic shock   Adjustment disorder with mixed anxiety and depressed mood   Hypothyroidism   Essential hypertension   Depression   Dehydration   Anxiety   Abdominal pain    1. 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 She was found to be septic. Her family opted for comfort measures given the futility of the advanced metastatic endometrial Ca. She therefore peacefully expired on 09/2314 with her mother at bedside.   Time: 5 minutes.  Signed:  Desere Gwin  [facility] 12/10/2014, 9:14 PM
